# Patient Record
Sex: Male | Born: 1935
Health system: Southern US, Community
[De-identification: ages and names within clinical notes are randomized; demographics above are authoritative.]

## PROBLEM LIST (undated history)

## (undated) DIAGNOSIS — I350 Nonrheumatic aortic (valve) stenosis: Secondary | ICD-10-CM

## (undated) DIAGNOSIS — E785 Hyperlipidemia, unspecified: Secondary | ICD-10-CM

## (undated) DIAGNOSIS — R609 Edema, unspecified: Secondary | ICD-10-CM

## (undated) DIAGNOSIS — I83893 Varicose veins of bilateral lower extremities with other complications: Secondary | ICD-10-CM

## (undated) DIAGNOSIS — I5032 Chronic diastolic (congestive) heart failure: Secondary | ICD-10-CM

## (undated) DIAGNOSIS — K589 Irritable bowel syndrome without diarrhea: Secondary | ICD-10-CM

## (undated) DIAGNOSIS — I493 Ventricular premature depolarization: Secondary | ICD-10-CM

## (undated) DIAGNOSIS — J189 Pneumonia, unspecified organism: Secondary | ICD-10-CM

## (undated) DIAGNOSIS — B029 Zoster without complications: Secondary | ICD-10-CM

## (undated) DIAGNOSIS — N3281 Overactive bladder: Secondary | ICD-10-CM

## (undated) DIAGNOSIS — K219 Gastro-esophageal reflux disease without esophagitis: Secondary | ICD-10-CM

## (undated) DIAGNOSIS — I70219 Atherosclerosis of native arteries of extremities with intermittent claudication, unspecified extremity: Secondary | ICD-10-CM

## (undated) DIAGNOSIS — M1990 Unspecified osteoarthritis, unspecified site: Secondary | ICD-10-CM

## (undated) DIAGNOSIS — I10 Essential (primary) hypertension: Secondary | ICD-10-CM

## (undated) DIAGNOSIS — I6381 Other cerebral infarction due to occlusion or stenosis of small artery: Secondary | ICD-10-CM

## (undated) DIAGNOSIS — K5790 Diverticulosis of intestine, part unspecified, without perforation or abscess without bleeding: Secondary | ICD-10-CM

## (undated) DIAGNOSIS — D649 Anemia, unspecified: Secondary | ICD-10-CM

## (undated) DIAGNOSIS — I2510 Atherosclerotic heart disease of native coronary artery without angina pectoris: Secondary | ICD-10-CM

## (undated) DIAGNOSIS — I5189 Other ill-defined heart diseases: Secondary | ICD-10-CM

## (undated) DIAGNOSIS — I82409 Acute embolism and thrombosis of unspecified deep veins of unspecified lower extremity: Secondary | ICD-10-CM

## (undated) HISTORY — PX: TONSILLECTOMY: SUR1361

## (undated) HISTORY — DX: Acute embolism and thrombosis of unspecified deep veins of unspecified lower extremity: I82.409

## (undated) HISTORY — DX: Pneumonia, unspecified organism: J18.9

## (undated) HISTORY — DX: Atherosclerotic heart disease of native coronary artery without angina pectoris: I25.10

## (undated) HISTORY — DX: Essential (primary) hypertension: I10

## (undated) HISTORY — DX: Other cerebral infarction due to occlusion or stenosis of small artery: I63.81

## (undated) HISTORY — DX: Chronic diastolic (congestive) heart failure: I50.32

## (undated) HISTORY — DX: Hyperlipidemia, unspecified: E78.5

## (undated) HISTORY — PX: OTHER SURGICAL HISTORY: SHX169

## (undated) HISTORY — PX: CATARACT EXTRACTION: SUR2

## (undated) HISTORY — DX: Anemia, unspecified: D64.9

## (undated) HISTORY — DX: Gastro-esophageal reflux disease without esophagitis: K21.9

## (undated) HISTORY — PX: BACK SURGERY: SHX140

## (undated) HISTORY — PX: TOTAL KNEE ARTHROPLASTY: SHX125

## (undated) HISTORY — PX: COLONOSCOPY: SHX174

## (undated) HISTORY — DX: Varicose veins of bilateral lower extremities with other complications: I83.893

## (undated) HISTORY — DX: Edema, unspecified: R60.9

## (undated) HISTORY — DX: Atherosclerosis of native arteries of extremities with intermittent claudication, unspecified extremity: I70.219

## (undated) HISTORY — DX: Diverticulosis of intestine, part unspecified, without perforation or abscess without bleeding: K57.90

## (undated) HISTORY — DX: Unspecified osteoarthritis, unspecified site: M19.90

## (undated) HISTORY — DX: Zoster without complications: B02.9

## (undated) HISTORY — PX: KNEE ARTHROSCOPY: SHX127

---

## 1959-12-12 HISTORY — PX: OTHER SURGICAL HISTORY: SHX169

## 1998-07-14 ENCOUNTER — Ambulatory Visit (HOSPITAL_BASED_OUTPATIENT_CLINIC_OR_DEPARTMENT_OTHER): Admission: RE | Admit: 1998-07-14 | Discharge: 1998-07-14 | Payer: Self-pay | Admitting: *Deleted

## 1999-10-17 ENCOUNTER — Encounter: Admission: RE | Admit: 1999-10-17 | Discharge: 1999-10-17 | Payer: Self-pay | Admitting: Family Medicine

## 1999-10-17 ENCOUNTER — Encounter: Payer: Self-pay | Admitting: Family Medicine

## 1999-12-07 ENCOUNTER — Ambulatory Visit (HOSPITAL_COMMUNITY): Admission: RE | Admit: 1999-12-07 | Discharge: 1999-12-07 | Payer: Self-pay | Admitting: Urology

## 2000-12-11 DIAGNOSIS — J189 Pneumonia, unspecified organism: Secondary | ICD-10-CM

## 2000-12-11 HISTORY — DX: Pneumonia, unspecified organism: J18.9

## 2001-03-04 ENCOUNTER — Other Ambulatory Visit: Admission: RE | Admit: 2001-03-04 | Discharge: 2001-03-04 | Payer: Self-pay | Admitting: Urology

## 2001-03-11 ENCOUNTER — Other Ambulatory Visit: Admission: RE | Admit: 2001-03-11 | Discharge: 2001-03-11 | Payer: Self-pay | Admitting: Urology

## 2001-03-18 ENCOUNTER — Ambulatory Visit (HOSPITAL_COMMUNITY): Admission: RE | Admit: 2001-03-18 | Discharge: 2001-03-18 | Payer: Self-pay | Admitting: Urology

## 2001-03-18 ENCOUNTER — Other Ambulatory Visit: Admission: RE | Admit: 2001-03-18 | Discharge: 2001-03-18 | Payer: Self-pay | Admitting: Urology

## 2001-03-18 ENCOUNTER — Encounter (INDEPENDENT_AMBULATORY_CARE_PROVIDER_SITE_OTHER): Payer: Self-pay | Admitting: *Deleted

## 2001-03-18 ENCOUNTER — Encounter: Payer: Self-pay | Admitting: Urology

## 2001-04-12 ENCOUNTER — Encounter: Payer: Self-pay | Admitting: Family Medicine

## 2001-04-12 ENCOUNTER — Ambulatory Visit (HOSPITAL_COMMUNITY): Admission: RE | Admit: 2001-04-12 | Discharge: 2001-04-12 | Payer: Self-pay | Admitting: Family Medicine

## 2001-04-12 ENCOUNTER — Ambulatory Visit (HOSPITAL_COMMUNITY): Admission: RE | Admit: 2001-04-12 | Discharge: 2001-04-12 | Payer: Self-pay | Admitting: Neurosurgery

## 2001-04-22 ENCOUNTER — Encounter: Payer: Self-pay | Admitting: Thoracic Surgery (Cardiothoracic Vascular Surgery)

## 2001-04-22 ENCOUNTER — Encounter
Admission: RE | Admit: 2001-04-22 | Discharge: 2001-04-22 | Payer: Self-pay | Admitting: Thoracic Surgery (Cardiothoracic Vascular Surgery)

## 2001-05-20 ENCOUNTER — Encounter
Admission: RE | Admit: 2001-05-20 | Discharge: 2001-05-20 | Payer: Self-pay | Admitting: Thoracic Surgery (Cardiothoracic Vascular Surgery)

## 2001-05-20 ENCOUNTER — Encounter: Payer: Self-pay | Admitting: Thoracic Surgery (Cardiothoracic Vascular Surgery)

## 2001-07-01 ENCOUNTER — Encounter
Admission: RE | Admit: 2001-07-01 | Discharge: 2001-07-01 | Payer: Self-pay | Admitting: Thoracic Surgery (Cardiothoracic Vascular Surgery)

## 2001-07-01 ENCOUNTER — Encounter: Payer: Self-pay | Admitting: Thoracic Surgery (Cardiothoracic Vascular Surgery)

## 2001-09-11 ENCOUNTER — Ambulatory Visit (HOSPITAL_BASED_OUTPATIENT_CLINIC_OR_DEPARTMENT_OTHER): Admission: RE | Admit: 2001-09-11 | Discharge: 2001-09-11 | Payer: Self-pay | Admitting: *Deleted

## 2002-01-02 ENCOUNTER — Encounter: Payer: Self-pay | Admitting: Internal Medicine

## 2003-01-15 ENCOUNTER — Encounter: Payer: Self-pay | Admitting: Internal Medicine

## 2003-12-15 ENCOUNTER — Encounter: Payer: Self-pay | Admitting: Internal Medicine

## 2004-04-28 ENCOUNTER — Inpatient Hospital Stay (HOSPITAL_COMMUNITY): Admission: RE | Admit: 2004-04-28 | Discharge: 2004-05-03 | Payer: Self-pay | Admitting: Orthopedic Surgery

## 2004-12-13 ENCOUNTER — Ambulatory Visit: Payer: Self-pay | Admitting: Internal Medicine

## 2004-12-20 ENCOUNTER — Ambulatory Visit: Payer: Self-pay | Admitting: Internal Medicine

## 2005-03-20 ENCOUNTER — Ambulatory Visit: Payer: Self-pay | Admitting: Internal Medicine

## 2005-03-27 ENCOUNTER — Ambulatory Visit: Payer: Self-pay | Admitting: Internal Medicine

## 2005-03-28 ENCOUNTER — Ambulatory Visit: Payer: Self-pay

## 2005-03-28 ENCOUNTER — Encounter: Payer: Self-pay | Admitting: Internal Medicine

## 2005-04-21 ENCOUNTER — Emergency Department (HOSPITAL_COMMUNITY): Admission: EM | Admit: 2005-04-21 | Discharge: 2005-04-21 | Payer: Self-pay | Admitting: Emergency Medicine

## 2005-04-25 ENCOUNTER — Ambulatory Visit: Payer: Self-pay | Admitting: Internal Medicine

## 2005-05-09 ENCOUNTER — Encounter: Payer: Self-pay | Admitting: Internal Medicine

## 2005-05-09 ENCOUNTER — Ambulatory Visit: Payer: Self-pay

## 2005-06-20 ENCOUNTER — Ambulatory Visit: Payer: Self-pay | Admitting: Internal Medicine

## 2005-06-21 ENCOUNTER — Encounter: Admission: RE | Admit: 2005-06-21 | Discharge: 2005-06-21 | Payer: Self-pay | Admitting: Internal Medicine

## 2005-07-31 ENCOUNTER — Ambulatory Visit: Payer: Self-pay | Admitting: Internal Medicine

## 2005-12-15 ENCOUNTER — Ambulatory Visit: Payer: Self-pay | Admitting: Internal Medicine

## 2005-12-22 ENCOUNTER — Ambulatory Visit: Payer: Self-pay | Admitting: Internal Medicine

## 2006-01-01 ENCOUNTER — Encounter: Admission: RE | Admit: 2006-01-01 | Discharge: 2006-04-01 | Payer: Self-pay | Admitting: Internal Medicine

## 2006-01-02 ENCOUNTER — Ambulatory Visit: Payer: Self-pay | Admitting: Gastroenterology

## 2006-01-16 ENCOUNTER — Ambulatory Visit: Payer: Self-pay | Admitting: Gastroenterology

## 2006-01-19 ENCOUNTER — Ambulatory Visit: Payer: Self-pay | Admitting: Internal Medicine

## 2006-02-05 ENCOUNTER — Encounter: Payer: Self-pay | Admitting: Internal Medicine

## 2006-04-11 ENCOUNTER — Ambulatory Visit: Payer: Self-pay | Admitting: Internal Medicine

## 2006-04-18 ENCOUNTER — Ambulatory Visit: Payer: Self-pay | Admitting: Internal Medicine

## 2006-05-31 ENCOUNTER — Inpatient Hospital Stay (HOSPITAL_COMMUNITY): Admission: RE | Admit: 2006-05-31 | Discharge: 2006-06-04 | Payer: Self-pay | Admitting: Orthopedic Surgery

## 2006-07-12 ENCOUNTER — Ambulatory Visit: Payer: Self-pay | Admitting: Internal Medicine

## 2006-07-19 ENCOUNTER — Ambulatory Visit: Payer: Self-pay | Admitting: Internal Medicine

## 2006-11-13 ENCOUNTER — Ambulatory Visit: Payer: Self-pay | Admitting: Internal Medicine

## 2006-12-27 ENCOUNTER — Ambulatory Visit: Payer: Self-pay | Admitting: Internal Medicine

## 2006-12-27 LAB — CONVERTED CEMR LAB
ALT: 26 units/L (ref 0–40)
AST: 33 units/L (ref 0–37)
Albumin: 4.1 g/dL (ref 3.5–5.2)
Alkaline Phosphatase: 47 units/L (ref 39–117)
BUN: 10 mg/dL (ref 6–23)
Basophils Absolute: 0 10*3/uL (ref 0.0–0.1)
Basophils Relative: 0.5 % (ref 0.0–1.0)
CO2: 33 meq/L — ABNORMAL HIGH (ref 19–32)
Calcium: 9.8 mg/dL (ref 8.4–10.5)
Chloride: 103 meq/L (ref 96–112)
Cholesterol: 142 mg/dL (ref 0–200)
Creatinine, Ser: 0.9 mg/dL (ref 0.4–1.5)
Creatinine,U: 64.3 mg/dL
Eosinophils Relative: 3.2 % (ref 0.0–5.0)
GFR calc Af Amer: 107 mL/min
GFR calc non Af Amer: 88 mL/min
Glucose, Bld: 128 mg/dL — ABNORMAL HIGH (ref 70–99)
HCT: 42 % (ref 39.0–52.0)
HDL: 41.6 mg/dL (ref >39.0)
Hemoglobin: 13.8 g/dL (ref 13.0–17.0)
Hgb A1c MFr Bld: 6.8 % — ABNORMAL HIGH (ref 4.6–6.0)
LDL Cholesterol: 87 mg/dL (ref 0–99)
Lymphocytes Relative: 39.8 % (ref 12.0–46.0)
MCHC: 32.9 g/dL (ref 30.0–36.0)
MCV: 87.1 fL (ref 78.0–100.0)
Microalb Creat Ratio: 40.4 mg/g — ABNORMAL HIGH (ref 0.0–30.0)
Microalb, Ur: 2.6 mg/dL — ABNORMAL HIGH (ref 0.0–1.9)
Monocytes Absolute: 0.6 10*3/uL (ref 0.2–0.7)
Monocytes Relative: 9.1 % (ref 3.0–11.0)
Neutro Abs: 3.1 10*3/uL (ref 1.4–7.7)
Neutrophils Relative %: 47.4 % (ref 43.0–77.0)
PSA: 2.22 ng/mL (ref 0.10–4.00)
Platelets: 212 10*3/uL (ref 150–400)
Potassium: 4 meq/L (ref 3.5–5.1)
RBC: 4.82 M/uL (ref 4.22–5.81)
RDW: 14 % (ref 11.5–14.6)
Sodium: 142 meq/L (ref 135–145)
TSH: 1.33 microintl units/mL (ref 0.35–5.50)
Total Bilirubin: 0.9 mg/dL (ref 0.3–1.2)
Total CHOL/HDL Ratio: 3.4
Total Protein: 7.5 g/dL (ref 6.0–8.3)
Triglycerides: 69 mg/dL (ref 0–149)
VLDL: 14 mg/dL (ref 0–40)
WBC: 6.4 10*3/uL (ref 4.5–10.5)

## 2007-01-03 ENCOUNTER — Ambulatory Visit: Payer: Self-pay | Admitting: Internal Medicine

## 2007-02-06 ENCOUNTER — Ambulatory Visit: Payer: Self-pay | Admitting: Gastroenterology

## 2007-02-07 ENCOUNTER — Ambulatory Visit (HOSPITAL_COMMUNITY): Admission: RE | Admit: 2007-02-07 | Discharge: 2007-02-07 | Payer: Self-pay | Admitting: Gastroenterology

## 2007-02-07 ENCOUNTER — Encounter: Payer: Self-pay | Admitting: Internal Medicine

## 2007-02-11 ENCOUNTER — Ambulatory Visit: Payer: Self-pay | Admitting: Gastroenterology

## 2007-05-02 ENCOUNTER — Ambulatory Visit: Payer: Self-pay | Admitting: Internal Medicine

## 2007-05-02 LAB — CONVERTED CEMR LAB
ALT: 30 units/L (ref 0–40)
AST: 31 units/L (ref 0–37)
Albumin: 4.3 g/dL (ref 3.5–5.2)
Alkaline Phosphatase: 40 units/L (ref 39–117)
Bilirubin, Direct: 0.3 mg/dL (ref 0.0–0.3)
Cholesterol: 147 mg/dL (ref 0–200)
HDL: 33.2 mg/dL — ABNORMAL LOW (ref >39.0)
Hgb A1c MFr Bld: 7.9 % — ABNORMAL HIGH (ref 4.6–6.0)
LDL Cholesterol: 100 mg/dL — ABNORMAL HIGH (ref 0–99)
Total Bilirubin: 1 mg/dL (ref 0.3–1.2)
Total CHOL/HDL Ratio: 4.4
Total Protein: 7.5 g/dL (ref 6.0–8.3)
Triglycerides: 71 mg/dL (ref 0–149)
VLDL: 14 mg/dL (ref 0–40)

## 2007-05-08 ENCOUNTER — Ambulatory Visit: Payer: Self-pay | Admitting: Internal Medicine

## 2007-06-13 DIAGNOSIS — E1151 Type 2 diabetes mellitus with diabetic peripheral angiopathy without gangrene: Secondary | ICD-10-CM

## 2007-06-13 DIAGNOSIS — E785 Hyperlipidemia, unspecified: Secondary | ICD-10-CM

## 2007-06-13 DIAGNOSIS — E1165 Type 2 diabetes mellitus with hyperglycemia: Secondary | ICD-10-CM

## 2007-06-13 DIAGNOSIS — K219 Gastro-esophageal reflux disease without esophagitis: Secondary | ICD-10-CM

## 2007-08-13 ENCOUNTER — Ambulatory Visit: Payer: Self-pay | Admitting: Internal Medicine

## 2007-08-13 LAB — CONVERTED CEMR LAB
ALT: 28 units/L (ref 0–53)
AST: 27 units/L (ref 0–37)
Albumin: 4 g/dL (ref 3.5–5.2)
Alkaline Phosphatase: 38 units/L — ABNORMAL LOW (ref 39–117)
BUN: 19 mg/dL (ref 6–23)
Bilirubin, Direct: 0.1 mg/dL (ref 0.0–0.3)
CO2: 31 meq/L (ref 19–32)
Calcium: 9.8 mg/dL (ref 8.4–10.5)
Chloride: 106 meq/L (ref 96–112)
Cholesterol: 126 mg/dL (ref 0–200)
Creatinine, Ser: 1 mg/dL (ref 0.4–1.5)
GFR calc Af Amer: 95 mL/min
GFR calc non Af Amer: 78 mL/min
Glucose, Bld: 146 mg/dL — ABNORMAL HIGH (ref 70–99)
HDL: 33.8 mg/dL — ABNORMAL LOW (ref >39.0)
Hgb A1c MFr Bld: 7.2 % — ABNORMAL HIGH (ref 4.6–6.0)
LDL Cholesterol: 82 mg/dL (ref 0–99)
Potassium: 5.4 meq/L — ABNORMAL HIGH (ref 3.5–5.1)
Sodium: 142 meq/L (ref 135–145)
Total Bilirubin: 1 mg/dL (ref 0.3–1.2)
Total CHOL/HDL Ratio: 3.7
Total Protein: 7.4 g/dL (ref 6.0–8.3)
Triglycerides: 53 mg/dL (ref 0–149)
VLDL: 11 mg/dL (ref 0–40)

## 2007-08-20 ENCOUNTER — Ambulatory Visit: Payer: Self-pay | Admitting: Internal Medicine

## 2007-08-20 DIAGNOSIS — I152 Hypertension secondary to endocrine disorders: Secondary | ICD-10-CM

## 2007-08-20 DIAGNOSIS — I10 Essential (primary) hypertension: Secondary | ICD-10-CM

## 2007-08-20 DIAGNOSIS — E1159 Type 2 diabetes mellitus with other circulatory complications: Secondary | ICD-10-CM

## 2007-08-20 HISTORY — DX: Type 2 diabetes mellitus with other circulatory complications: E11.59

## 2007-08-20 HISTORY — DX: Hypertension secondary to endocrine disorders: I15.2

## 2007-10-15 ENCOUNTER — Ambulatory Visit: Payer: Self-pay | Admitting: Internal Medicine

## 2007-12-12 LAB — HM DIABETES FOOT EXAM

## 2007-12-17 ENCOUNTER — Ambulatory Visit: Payer: Self-pay | Admitting: Internal Medicine

## 2007-12-17 LAB — CONVERTED CEMR LAB
ALT: 31 units/L (ref 0–53)
AST: 29 units/L (ref 0–37)
Albumin: 4.1 g/dL (ref 3.5–5.2)
Basophils Absolute: 0 10*3/uL (ref 0.0–0.1)
Basophils Relative: 0.4 % (ref 0.0–1.0)
Chloride: 100 meq/L (ref 96–112)
Cholesterol: 153 mg/dL (ref 0–200)
Eosinophils Absolute: 0.3 10*3/uL (ref 0.0–0.6)
Eosinophils Relative: 3.6 % (ref 0.0–5.0)
GFR calc Af Amer: 107 mL/min
GFR calc non Af Amer: 88 mL/min
Glucose, Urine, Semiquant: NEGATIVE
HCT: 43.8 % (ref 39.0–52.0)
HDL: 37.4 mg/dL — ABNORMAL LOW (ref >39.0)
Hemoglobin: 14.8 g/dL (ref 13.0–17.0)
Ketones, urine, test strip: NEGATIVE
MCHC: 33.8 g/dL (ref 30.0–36.0)
Monocytes Relative: 9.5 % (ref 3.0–11.0)
Neutro Abs: 3.7 10*3/uL (ref 1.4–7.7)
Neutrophils Relative %: 52.3 % (ref 43.0–77.0)
Total Bilirubin: 1.1 mg/dL (ref 0.3–1.2)
Urobilinogen, UA: 0.2
VLDL: 15 mg/dL (ref 0–40)
WBC Urine, dipstick: NEGATIVE
WBC: 7.1 10*3/uL (ref 4.5–10.5)

## 2007-12-24 ENCOUNTER — Ambulatory Visit: Payer: Self-pay | Admitting: Internal Medicine

## 2007-12-24 ENCOUNTER — Telehealth (INDEPENDENT_AMBULATORY_CARE_PROVIDER_SITE_OTHER): Payer: Self-pay | Admitting: *Deleted

## 2007-12-26 ENCOUNTER — Ambulatory Visit: Payer: Self-pay | Admitting: Internal Medicine

## 2007-12-27 ENCOUNTER — Telehealth (INDEPENDENT_AMBULATORY_CARE_PROVIDER_SITE_OTHER): Payer: Self-pay | Admitting: *Deleted

## 2008-01-30 ENCOUNTER — Encounter: Payer: Self-pay | Admitting: Internal Medicine

## 2008-03-16 ENCOUNTER — Ambulatory Visit: Payer: Self-pay | Admitting: Internal Medicine

## 2008-03-16 LAB — CONVERTED CEMR LAB
ALT: 29 units/L (ref 0–53)
AST: 25 units/L (ref 0–37)
Alkaline Phosphatase: 37 units/L — ABNORMAL LOW (ref 39–117)
Bilirubin, Direct: 0.2 mg/dL (ref 0.0–0.3)
GFR calc non Af Amer: 101 mL/min
Glucose, Bld: 162 mg/dL — ABNORMAL HIGH (ref 70–99)
HDL: 34.8 mg/dL — ABNORMAL LOW (ref >39.0)
Potassium: 5.1 meq/L (ref 3.5–5.1)
Sodium: 141 meq/L (ref 135–145)
Total Bilirubin: 0.9 mg/dL (ref 0.3–1.2)
Total CHOL/HDL Ratio: 4.3
Triglycerides: 78 mg/dL (ref 0–149)
VLDL: 16 mg/dL (ref 0–40)

## 2008-03-23 ENCOUNTER — Ambulatory Visit: Payer: Self-pay | Admitting: Internal Medicine

## 2008-07-23 ENCOUNTER — Ambulatory Visit: Payer: Self-pay | Admitting: Internal Medicine

## 2008-08-11 ENCOUNTER — Ambulatory Visit: Payer: Self-pay | Admitting: Internal Medicine

## 2008-08-11 LAB — CONVERTED CEMR LAB
ALT: 34 units/L (ref 0–53)
Alkaline Phosphatase: 40 units/L (ref 39–117)
Bilirubin, Direct: 0.1 mg/dL (ref 0.0–0.3)
Calcium: 9.5 mg/dL (ref 8.4–10.5)
Chloride: 107 meq/L (ref 96–112)
Cholesterol: 142 mg/dL (ref 0–200)
GFR calc non Af Amer: 88 mL/min
Hgb A1c MFr Bld: 7.6 % — ABNORMAL HIGH (ref 4.6–6.0)
LDL Cholesterol: 94 mg/dL (ref 0–99)
Sodium: 143 meq/L (ref 135–145)
Total CHOL/HDL Ratio: 4
Total Protein: 7.1 g/dL (ref 6.0–8.3)
VLDL: 12 mg/dL (ref 0–40)

## 2008-09-02 ENCOUNTER — Encounter: Payer: Self-pay | Admitting: Internal Medicine

## 2008-09-22 ENCOUNTER — Ambulatory Visit: Payer: Self-pay | Admitting: Internal Medicine

## 2008-11-30 ENCOUNTER — Encounter: Payer: Self-pay | Admitting: Internal Medicine

## 2008-12-21 ENCOUNTER — Ambulatory Visit: Payer: Self-pay | Admitting: Internal Medicine

## 2008-12-21 LAB — CONVERTED CEMR LAB
ALT: 31 units/L (ref 0–53)
Albumin: 4.1 g/dL (ref 3.5–5.2)
Basophils Absolute: 0 10*3/uL (ref 0.0–0.1)
Bilirubin, Direct: 0.1 mg/dL (ref 0.0–0.3)
CO2: 32 meq/L (ref 19–32)
Chloride: 102 meq/L (ref 96–112)
Cholesterol: 150 mg/dL (ref 0–200)
Creatinine,U: 163.6 mg/dL
Eosinophils Absolute: 0.1 10*3/uL (ref 0.0–0.7)
Glucose, Bld: 151 mg/dL — ABNORMAL HIGH (ref 70–99)
HCT: 40.5 % (ref 39.0–52.0)
Hemoglobin: 14 g/dL (ref 13.0–17.0)
MCHC: 34.5 g/dL (ref 30.0–36.0)
Nitrite: NEGATIVE
Platelets: 177 10*3/uL (ref 150–400)
RBC: 4.48 M/uL (ref 4.22–5.81)
RDW: 13.4 % (ref 11.5–14.6)
Sodium: 139 meq/L (ref 135–145)
Specific Gravity, Urine: 1.025
Total Protein: 6.9 g/dL (ref 6.0–8.3)
VLDL: 10 mg/dL (ref 0–40)
WBC Urine, dipstick: NEGATIVE

## 2008-12-28 ENCOUNTER — Ambulatory Visit: Payer: Self-pay | Admitting: Internal Medicine

## 2009-04-26 ENCOUNTER — Ambulatory Visit: Payer: Self-pay | Admitting: Internal Medicine

## 2009-04-26 LAB — CONVERTED CEMR LAB
ALT: 29 units/L (ref 0–53)
AST: 35 units/L (ref 0–37)
BUN: 14 mg/dL (ref 6–23)
Bilirubin, Direct: 0.2 mg/dL (ref 0.0–0.3)
GFR calc non Af Amer: 117.31 mL/min (ref >60)
Glucose, Bld: 117 mg/dL — ABNORMAL HIGH (ref 70–99)
Potassium: 4 meq/L (ref 3.5–5.1)
Total Bilirubin: 1.1 mg/dL (ref 0.3–1.2)
Total CHOL/HDL Ratio: 4

## 2009-05-11 ENCOUNTER — Ambulatory Visit: Payer: Self-pay | Admitting: Internal Medicine

## 2009-06-03 ENCOUNTER — Encounter: Payer: Self-pay | Admitting: Internal Medicine

## 2009-09-06 ENCOUNTER — Ambulatory Visit: Payer: Self-pay | Admitting: Internal Medicine

## 2009-09-06 LAB — CONVERTED CEMR LAB
ALT: 31 units/L (ref 0–53)
Albumin: 4.1 g/dL (ref 3.5–5.2)
BUN: 17 mg/dL (ref 6–23)
Bilirubin, Direct: 0.1 mg/dL (ref 0.0–0.3)
Calcium: 9.6 mg/dL (ref 8.4–10.5)
Cholesterol: 147 mg/dL (ref 0–200)
Creatinine, Ser: 0.8 mg/dL (ref 0.4–1.5)
GFR calc non Af Amer: 100.46 mL/min (ref >60)
Glucose, Bld: 118 mg/dL — ABNORMAL HIGH (ref 70–99)
Total Bilirubin: 0.9 mg/dL (ref 0.3–1.2)
Total Protein: 7.1 g/dL (ref 6.0–8.3)
Triglycerides: 45 mg/dL (ref 0.0–149.0)
VLDL: 9 mg/dL (ref 0.0–40.0)

## 2009-09-13 ENCOUNTER — Ambulatory Visit: Payer: Self-pay | Admitting: Internal Medicine

## 2009-10-19 ENCOUNTER — Telehealth: Payer: Self-pay | Admitting: Internal Medicine

## 2009-12-11 LAB — HM DIABETES EYE EXAM

## 2010-01-17 ENCOUNTER — Ambulatory Visit: Payer: Self-pay | Admitting: Internal Medicine

## 2010-01-17 LAB — CONVERTED CEMR LAB
ALT: 30 units/L (ref 0–53)
AST: 33 units/L (ref 0–37)
Albumin: 4.1 g/dL (ref 3.5–5.2)
Alkaline Phosphatase: 39 units/L (ref 39–117)
GFR calc non Af Amer: 77.57 mL/min (ref >60)
Hgb A1c MFr Bld: 6.8 % — ABNORMAL HIGH (ref 4.6–6.5)
Total Protein: 7.2 g/dL (ref 6.0–8.3)
Triglycerides: 53 mg/dL (ref 0.0–149.0)

## 2010-01-24 ENCOUNTER — Ambulatory Visit: Payer: Self-pay | Admitting: Internal Medicine

## 2010-02-04 ENCOUNTER — Telehealth: Payer: Self-pay | Admitting: Internal Medicine

## 2010-02-21 ENCOUNTER — Telehealth: Payer: Self-pay | Admitting: Internal Medicine

## 2010-05-23 ENCOUNTER — Ambulatory Visit: Payer: Self-pay | Admitting: Internal Medicine

## 2010-05-23 LAB — CONVERTED CEMR LAB
AST: 33 units/L (ref 0–37)
Albumin: 4.1 g/dL (ref 3.5–5.2)
Calcium: 9.8 mg/dL (ref 8.4–10.5)
Cholesterol: 149 mg/dL (ref 0–200)
Creatinine, Ser: 0.8 mg/dL (ref 0.4–1.5)
GFR calc non Af Amer: 101.73 mL/min (ref >60)
Glucose, Bld: 125 mg/dL — ABNORMAL HIGH (ref 70–99)
HDL: 42.7 mg/dL (ref >39.00)
Hgb A1c MFr Bld: 6.9 % — ABNORMAL HIGH (ref 4.6–6.5)
Sodium: 144 meq/L (ref 135–145)
Total Protein: 6.9 g/dL (ref 6.0–8.3)
Triglycerides: 50 mg/dL (ref 0.0–149.0)
VLDL: 10 mg/dL (ref 0.0–40.0)

## 2010-05-30 ENCOUNTER — Ambulatory Visit: Payer: Self-pay | Admitting: Internal Medicine

## 2010-05-30 DIAGNOSIS — E1142 Type 2 diabetes mellitus with diabetic polyneuropathy: Secondary | ICD-10-CM

## 2010-05-30 HISTORY — DX: Type 2 diabetes mellitus with diabetic polyneuropathy: E11.42

## 2010-05-31 LAB — CONVERTED CEMR LAB
BUN: 16 mg/dL (ref 6–23)
GFR calc non Af Amer: 85.33 mL/min (ref >60)

## 2010-06-03 ENCOUNTER — Ambulatory Visit: Payer: Self-pay | Admitting: Internal Medicine

## 2010-06-06 LAB — CONVERTED CEMR LAB
CO2: 30 meq/L (ref 19–32)
Calcium: 9.5 mg/dL (ref 8.4–10.5)
Glucose, Bld: 107 mg/dL — ABNORMAL HIGH (ref 70–99)
Potassium: 4.8 meq/L (ref 3.5–5.1)
Sodium: 142 meq/L (ref 135–145)

## 2010-08-16 ENCOUNTER — Encounter: Payer: Self-pay | Admitting: Internal Medicine

## 2010-09-05 ENCOUNTER — Encounter: Payer: Self-pay | Admitting: Internal Medicine

## 2010-09-19 ENCOUNTER — Ambulatory Visit: Payer: Self-pay | Admitting: Internal Medicine

## 2010-09-19 LAB — CONVERTED CEMR LAB
ALT: 40 units/L (ref 0–53)
Albumin: 4.2 g/dL (ref 3.5–5.2)
BUN: 13 mg/dL (ref 6–23)
Bilirubin, Direct: 0.1 mg/dL (ref 0.0–0.3)
Chloride: 102 meq/L (ref 96–112)
Creatinine, Ser: 0.8 mg/dL (ref 0.4–1.5)
GFR calc non Af Amer: 98.75 mL/min (ref >60)
LDL Cholesterol: 102 mg/dL — ABNORMAL HIGH (ref 0–99)
Potassium: 4.1 meq/L (ref 3.5–5.1)
Total Bilirubin: 0.9 mg/dL (ref 0.3–1.2)
Total CHOL/HDL Ratio: 3
Total Protein: 7 g/dL (ref 6.0–8.3)
Triglycerides: 70 mg/dL (ref 0.0–149.0)
VLDL: 14 mg/dL (ref 0.0–40.0)

## 2010-09-26 ENCOUNTER — Ambulatory Visit: Payer: Self-pay | Admitting: Internal Medicine

## 2010-11-28 ENCOUNTER — Encounter: Payer: Self-pay | Admitting: Internal Medicine

## 2010-12-11 LAB — HM DIABETES EYE EXAM: HM Diabetic Eye Exam: NORMAL

## 2010-12-11 LAB — HM DIABETES FOOT EXAM: HM Diabetic Foot Exam: NORMAL

## 2011-01-10 NOTE — Assessment & Plan Note (Signed)
Summary: 4 month rov/njr   Vital Signs:  Patient profile:   77 year old male Weight:      206 pounds Temp:     98.4 degrees F Pulse rate:   60 / minute Resp:     12 per minute BP sitting:   120 / 70  (left arm)  Vitals Entered By: Gladis Riffle, RN (January 24, 2010 7:58 AM) CC: 4 month rov, labs done Is Patient Diabetic? No   CC:  4 month rov and labs done.  History of Present Illness:  Follow-Up Visit      This is a 77 year old man who presents for Follow-up visit.  The patient denies SOB and palpitations.  He has had some right sided shoulder pain that extends to chest wall. He thinks MSK because he had been lifting objcects previously. Reviewed stress test from 5 years ago Since the last visit the patient notes no new problems or concerns.  The patient reports taking meds as prescribed.  When questioned about possible medication side effects, the patient notes none.    All other systems reviewed and were negative   Preventive Screening-Counseling & Management  Alcohol-Tobacco     Smoking Status: never  Current Problems (verified): 1)  Hyperlipidemia  (ICD-272.4) 2)  Examination, Routine Medical  (ICD-V70.0) 3)  Hypertension  (ICD-401.9) 4)  Dyslipidemia  (ICD-272.4) 5)  Gerd  (ICD-530.81) 6)  Diverticulosis, Colon  (ICD-562.10) 7)  Diabetes Mellitus, Type II  (ICD-250.00)  Current Medications (verified): 1)  Aspir-81 81 Mg Tbec (Aspirin) .... Take 1 Tablet By Mouth Once A Day 2)  Doxazosin Mesylate 4 Mg  Tabs (Doxazosin Mesylate) .Marland Kitchen.. 1 Tablet Nightly 3)  Prilosec Otc 20 Mg Tbec (Omeprazole Magnesium) .... One By Mouth Daily 4)  Simvastatin 20 Mg Tabs (Simvastatin) .... One By Mouth Daily 5)  Gelnique 10 % Gel (Oxybutynin Chloride) .... Apply Daily As Needed 6)  Multivitamins  Tabs (Multiple Vitamin) .... Once Daily 7)  Lisinopril 20 Mg Tabs (Lisinopril) .Marland Kitchen.. 1 Tablet By Mouth Daily 8)  Etodolac 200 Mg Caps (Etodolac) .... Take 1 Capsulet By Mouth Two Times A  Day  Allergies: 1)  Cipro 2)  Sulfamethoxazole (Sulfamethoxazole)  Past History:  Past Medical History: Last updated: 08/11/2008 Diabetes mellitus, type II Diverticulosis, colon GERD Osteoarthritis Pneumonia, hx of, 2002 stress incontinence, urge dyslipidemia hiatal hernia Hyperlipidemia  Past Surgical History: Last updated: 06/13/2007 wrist tendon surgery CTS bilateral knee arthroscopy, bilateral  1988, 1992 Total knee replacement 2005, 2007 lumbar L arm 1961  Family History: Last updated: 12/24/2007 father still living-92 yo---good health.  mother still living 56 yo---hx CABG in her early 38s  Social History: Last updated: 12/24/2007 Married Never Smoked Regular exercise-yes Occupation: school bus for [employer]  Risk Factors: Exercise: yes (08/20/2007)  Risk Factors: Smoking Status: never (01/24/2010)  Physical Exam  General:  Well-developed,well-nourished,in no acute distress; alert,appropriate and cooperative throughout examination Head:  normocephalic and atraumatic.   Eyes:  pupils equal and pupils round.   Neck:  No deformities, masses, or tenderness noted. Chest Wall:  No deformities, masses, tenderness or gynecomastia noted. Lungs:  Normal respiratory effort, chest expands symmetrically. Lungs are clear to auscultation, no crackles or wheezes. Heart:  Normal rate and regular rhythm. S1 and S2 normal without gallop,, click, rub or other extra sounds. 2/6 short SEM LUSB Abdomen:  Bowel sounds positive,abdomen soft and non-tender without masses, organomegaly or hernias noted. Msk:  No deformity or scoliosis noted of thoracic or lumbar spine.  Pulses:  R radial normal and L radial normal.   Neurologic:  cranial nerves II-XII intact and gait normal.    Diabetes Management Exam:    Eye Exam:       Eye Exam done elsewhere          Date: 12/11/2009          Results: normal-pt's report          Done by: ophthalm   Impression & Recommendations:  Problem  # 1:  HYPERLIPIDEMIA (ICD-272.4)  well controlled continue current medications  His updated medication list for this problem includes:    Simvastatin 20 Mg Tabs (Simvastatin) ..... One by mouth daily  Labs Reviewed: SGOT: 33 (01/17/2010)   SGPT: 30 (01/17/2010)  Prior 10 Yr Risk Heart Disease: 33 % (08/11/2008)   HDL:49.30 (01/17/2010), 42.30 (09/06/2009)  LDL:86 (01/17/2010), 96 (09/06/2009)  Chol:146 (01/17/2010), 147 (09/06/2009)  Trig:53.0 (01/17/2010), 45.0 (09/06/2009)  Orders: EKG w/ Interpretation (93000)  Problem # 2:  HYPERTENSION (ICD-401.9)  controlled continue current medications  His updated medication list for this problem includes:    Doxazosin Mesylate 4 Mg Tabs (Doxazosin mesylate) .Marland Kitchen... 1 tablet nightly    Lisinopril 20 Mg Tabs (Lisinopril) .Marland Kitchen... 1 tablet by mouth daily  BP today: 120/70 Prior BP: 156/72 (09/13/2009)  Prior 10 Yr Risk Heart Disease: 33 % (08/11/2008)  Labs Reviewed: K+: 4.3 (01/17/2010) Creat: : 1.0 (01/17/2010)   Chol: 146 (01/17/2010)   HDL: 49.30 (01/17/2010)   LDL: 86 (01/17/2010)   TG: 53.0 (01/17/2010)  Orders: EKG w/ Interpretation (93000)  Problem # 3:  DIABETES MELLITUS, TYPE II (ICD-250.00) check labs today note weight gain---discussed need for diet/exercise   His updated medication list for this problem includes:    Aspir-81 81 Mg Tbec (Aspirin) .Marland Kitchen... Take 1 tablet by mouth once a day    Lisinopril 20 Mg Tabs (Lisinopril) .Marland Kitchen... 1 tablet by mouth daily  Labs Reviewed: Creat: 1.0 (01/17/2010)     Last Eye Exam: normal-pt's report (12/11/2009) Reviewed HgBA1c results: 6.8 (01/17/2010)  6.7 (09/06/2009)  Problem # 4:  GERD (ICD-530.81) well controlled on ppi note he does take etodolac   His updated medication list for this problem includes:    Prilosec Otc 20 Mg Tbec (Omeprazole magnesium) ..... One by mouth daily  Problem # 5:  DEGENERATIVE JOINT DISEASE, GENERALIZED (ICD-715.00) well controlled on as needed  etodolac The following medications were removed from the medication list:    Advil 200 Mg Tabs (Ibuprofen) .Marland Kitchen..Marland Kitchen Two as needed His updated medication list for this problem includes:    Aspir-81 81 Mg Tbec (Aspirin) .Marland Kitchen... Take 1 tablet by mouth once a day    Etodolac 200 Mg Caps (Etodolac) .Marland Kitchen... Take 1 capsulet by mouth two times a day  Complete Medication List: 1)  Aspir-81 81 Mg Tbec (Aspirin) .... Take 1 tablet by mouth once a day 2)  Doxazosin Mesylate 4 Mg Tabs (Doxazosin mesylate) .Marland Kitchen.. 1 tablet nightly 3)  Prilosec Otc 20 Mg Tbec (Omeprazole magnesium) .... One by mouth daily 4)  Simvastatin 20 Mg Tabs (Simvastatin) .... One by mouth daily 5)  Gelnique 10 % Gel (Oxybutynin chloride) .... Apply daily as needed 6)  Multivitamins Tabs (Multiple vitamin) .... Once daily 7)  Lisinopril 20 Mg Tabs (Lisinopril) .Marland Kitchen.. 1 tablet by mouth daily 8)  Etodolac 200 Mg Caps (Etodolac) .... Take 1 capsulet by mouth two times a day  Other Orders: TLB-A1C / Hgb A1C (Glycohemoglobin) (83036-A1C) TLB-BMP (Basic Metabolic Panel-BMET) (80048-METABOL) TLB-Lipid  Panel (80061-LIPID) TLB-ALT (SGPT) (84460-ALT)  Patient Instructions: 1)  Please schedule a follow-up appointment in 4 months. 2)  labs one week prior to visit 3)  lipids---272.4 4)  lfts-995.2 5)  bmet-995.2 6)  A1C-250.02 7)

## 2011-01-10 NOTE — Assessment & Plan Note (Signed)
Summary: 4 MONTH ROV/NJR   Vital Signs:  Patient profile:   77 year old male Weight:      214 pounds Temp:     98 degrees F oral Pulse rate:   62 / minute BP sitting:   128 / 76  Vitals Entered By: Lynann Beaver CMA (September 26, 2010 8:53 AM) CC: rov Pain Assessment Patient in pain? no        CC:  rov.  History of Present Illness:  Follow-Up Visit      This is a 77 year old man who presents for Follow-up visit.  The patient denies chest pain and palpitations.  Since the last visit the patient notes no new problems or concerns.  The patient reports taking meds as prescribed and monitoring blood sugars.  When questioned about possible medication side effects, the patient notes none.    patient denies chest pain, shortness of breath, PND, orthopnea, lower extremity edema. Appetite is normal. He denies any difficulty swallowing. No rashes or neurologic complaints. No complaints related HEENT.  Current Problems (verified): 1)  Peripheral Neuropathy  (ICD-356.9) 2)  Degenerative Joint Disease, Generalized  (ICD-715.00) 3)  Hyperlipidemia  (ICD-272.4) 4)  Examination, Routine Medical  (ICD-V70.0) 5)  Hypertension  (ICD-401.9) 6)  Dyslipidemia  (ICD-272.4) 7)  Gerd  (ICD-530.81) 8)  Diverticulosis, Colon  (ICD-562.10) 9)  Diabetes Mellitus, Type II  (ICD-250.00)  Current Medications (verified): 1)  Aspir-81 81 Mg Tbec (Aspirin) .... Take 1 Tablet By Mouth Once A Day 2)  Doxazosin Mesylate 4 Mg  Tabs (Doxazosin Mesylate) .Marland Kitchen.. 1 Tablet Nightly 3)  Prilosec Otc 20 Mg Tbec (Omeprazole Magnesium) .... One By Mouth Daily 4)  Simvastatin 20 Mg Tabs (Simvastatin) .... One By Mouth Daily 5)  Multivitamins  Tabs (Multiple Vitamin) .... Once Daily 6)  Etodolac 200 Mg Caps (Etodolac) .... Take 1 Capsulet By Mouth Two Times A Day 7)  Amlodipine Besylate 5 Mg Tabs (Amlodipine Besylate) .... Take 1 Tablet By Mouth Once A Day  Allergies (verified): 1)  Cipro 2)  Sulfamethoxazole  (Sulfamethoxazole)  Past History:  Past Medical History: Last updated: 08/11/2008 Diabetes mellitus, type II Diverticulosis, colon GERD Osteoarthritis Pneumonia, hx of, 2002 stress incontinence, urge dyslipidemia hiatal hernia Hyperlipidemia  Past Surgical History: Last updated: 06/13/2007 wrist tendon surgery CTS bilateral knee arthroscopy, bilateral  1988, 1992 Total knee replacement 2005, 2007 lumbar L arm 1961  Family History: Last updated: 12/24/2007 father still living-92 yo---good health.  mother still living 71 yo---hx CABG in her early 49s  Social History: Last updated: 12/24/2007 Married Never Smoked Regular exercise-yes Occupation: school bus for [employer]  Risk Factors: Exercise: yes (08/20/2007)  Risk Factors: Smoking Status: never (05/30/2010)  Physical Exam  General:  well-developed well-nourished male in no acute distress. HEENT exam atraumatic, normocephalic extraocular muscles are intact. Neck is supple without lymphadenopathy or thyromegaly. Chest clear to auscultation without any increased work of breathing. Cardiac exam S1-S2 are regular. Abdominal exam are wake him at bowel sounds, soft, nontender. Extremities no clubbing cyanosis or edema. Neurologic exam is alert gait is normal.   Impression & Recommendations:  Problem # 1:  HYPERLIPIDEMIA (ICD-272.4)  controlled continue current medications  His updated medication list for this problem includes:    Simvastatin 20 Mg Tabs (Simvastatin) ..... One by mouth daily  Labs Reviewed: SGOT: 40 (09/19/2010)   SGPT: 40 (09/19/2010)  Prior 10 Yr Risk Heart Disease: 33 % (08/11/2008)   HDL:46.50 (09/19/2010), 42.70 (05/23/2010)  LDL:102 (09/19/2010), 96 (16/09/9603)  Chol:162 (09/19/2010), 149 (05/23/2010)  Trig:70.0 (09/19/2010), 50.0 (05/23/2010)  His updated medication list for this problem includes:    Simvastatin 20 Mg Tabs (Simvastatin) ..... One by mouth daily  Problem # 2:  HYPERTENSION  (ICD-401.9)  controlled continue current medications  His updated medication list for this problem includes:    Doxazosin Mesylate 4 Mg Tabs (Doxazosin mesylate) .Marland Kitchen... 1 tablet nightly    Amlodipine Besylate 5 Mg Tabs (Amlodipine besylate) .Marland Kitchen... Take 1 tablet by mouth once a day  BP today: 128/76 Prior BP: 140/72 (05/30/2010)  Prior 10 Yr Risk Heart Disease: 33 % (08/11/2008)  Labs Reviewed: K+: 4.1 (09/19/2010) Creat: : 0.8 (09/19/2010)   Chol: 162 (09/19/2010)   HDL: 46.50 (09/19/2010)   LDL: 102 (09/19/2010)   TG: 70.0 (09/19/2010)  His updated medication list for this problem includes:    Doxazosin Mesylate 4 Mg Tabs (Doxazosin mesylate) .Marland Kitchen... 1 tablet nightly    Amlodipine Besylate 5 Mg Tabs (Amlodipine besylate) .Marland Kitchen... Take 1 tablet by mouth once a day  Problem # 3:  DIABETES MELLITUS, TYPE II (ICD-250.00)  not as well-controlled as previously. I will not start medications at this time. I've discussed with him his need for aggressive exercise and weight loss. probably has some peripheral neuropathy---affecting toes His updated medication list for this problem includes:    Aspir-81 81 Mg Tbec (Aspirin) .Marland Kitchen... Take 1 tablet by mouth once a day  Labs Reviewed: Creat: 0.8 (09/19/2010)     Last Eye Exam: normal-pt's report (12/11/2009) Reviewed HgBA1c results: 7.3 (09/19/2010)  6.9 (05/23/2010)  His updated medication list for this problem includes:    Aspir-81 81 Mg Tbec (Aspirin) .Marland Kitchen... Take 1 tablet by mouth once a day  Problem # 4:  DEGENERATIVE JOINT DISEASE, GENERALIZED (ICD-715.00)  okay to continue current medications. He understands risks. His updated medication list for this problem includes:    Aspir-81 81 Mg Tbec (Aspirin) .Marland Kitchen... Take 1 tablet by mouth once a day    Etodolac 200 Mg Caps (Etodolac) .Marland Kitchen... Take 1 capsulet by mouth two times a day  His updated medication list for this problem includes:    Aspir-81 81 Mg Tbec (Aspirin) .Marland Kitchen... Take 1 tablet by mouth  once a day    Etodolac 200 Mg Caps (Etodolac) .Marland Kitchen... Take 1 capsulet by mouth two times a day  Complete Medication List: 1)  Aspir-81 81 Mg Tbec (Aspirin) .... Take 1 tablet by mouth once a day 2)  Doxazosin Mesylate 4 Mg Tabs (Doxazosin mesylate) .Marland Kitchen.. 1 tablet nightly 3)  Prilosec Otc 20 Mg Tbec (Omeprazole magnesium) .... One by mouth daily 4)  Simvastatin 20 Mg Tabs (Simvastatin) .... One by mouth daily 5)  Multivitamins Tabs (Multiple vitamin) .... Once daily 6)  Etodolac 200 Mg Caps (Etodolac) .... Take 1 capsulet by mouth two times a day 7)  Amlodipine Besylate 5 Mg Tabs (Amlodipine besylate) .... Take 1 tablet by mouth once a day  Other Orders: Admin 1st Vaccine (16109) Flu Vaccine 32yrs + (60454)   Patient Instructions: 1)  Please schedule a follow-up appointment in 4 months. 2)  labs one week prior to visit 3)  lipids---272.4 4)  lfts-995.2 5)  bmet-995.2 6)  A1C-250.02 7)     Flu Vaccine Consent Questions     Do you have a history of severe allergic reactions to this vaccine? no    Any prior history of allergic reactions to egg and/or gelatin? no    Do you have a sensitivity to the preservative  Thimersol? no    Do you have a past history of Guillan-Barre Syndrome? no    Do you currently have an acute febrile illness? no    Have you ever had a severe reaction to latex? no    Vaccine information given and explained to patient? yes    Are you currently pregnant? no    Lot Number:[id number]   Exp Date:06/10/2011   Site Given  Left Deltoid IM  Orders Added: 1)  Admin 1st Vaccine [90471] 2)  Flu Vaccine 82yrs + [60454] 3)  New Patient  1-4 years [99382] 4)  Est. Patient Level IV [09811]    .[email or web address]

## 2011-01-10 NOTE — Assessment & Plan Note (Signed)
Summary: 4 month rov/njr   Vital Signs:  Patient profile:   77 year old male Weight:      210 pounds BMI:     31.58 Pulse rate:   62 / minute Pulse rhythm:   regular Resp:     12 per minute BP sitting:   140 / 72  (left arm) Cuff size:   regular  Vitals Entered By: Gladis Riffle, RN (May 30, 2010 8:05 AM)  Nutrition Counseling: Patient's BMI is greater than 25 and therefore counseled on weight management options. CC: 4 month rov, labs done- Is Patient Diabetic? Yes Did you bring your meter with you today? No   CC:  4 month rov and labs done-.  History of Present Illness:  Follow-Up Visit      This is a 77 year old man who presents for Follow-up visit.  The patient denies chest pain and palpitations.  Since the last visit the patient notes no new problems or concerns.  The patient reports taking meds as prescribed.  When questioned about possible medication side effects, the patient notes none.    All other systems reviewed and were negative   Preventive Screening-Counseling & Management  Alcohol-Tobacco     Smoking Status: never  Current Medications (verified): 1)  Aspir-81 81 Mg Tbec (Aspirin) .... Take 1 Tablet By Mouth Once A Day 2)  Doxazosin Mesylate 4 Mg  Tabs (Doxazosin Mesylate) .Marland Kitchen.. 1 Tablet Nightly 3)  Prilosec Otc 20 Mg Tbec (Omeprazole Magnesium) .... One By Mouth Daily 4)  Simvastatin 20 Mg Tabs (Simvastatin) .... One By Mouth Daily 5)  Multivitamins  Tabs (Multiple Vitamin) .... Once Daily 6)  Lisinopril 20 Mg Tabs (Lisinopril) .Marland Kitchen.. 1 Tablet By Mouth Daily 7)  Etodolac 200 Mg Caps (Etodolac) .... Take 1 Capsulet By Mouth Two Times A Day  Allergies: 1)  Cipro 2)  Sulfamethoxazole (Sulfamethoxazole)  Past History:  Past Medical History: Last updated: 08/11/2008 Diabetes mellitus, type II Diverticulosis, colon GERD Osteoarthritis Pneumonia, hx of, 2002 stress incontinence, urge dyslipidemia hiatal hernia Hyperlipidemia  Past Surgical  History: Last updated: 06/13/2007 wrist tendon surgery CTS bilateral knee arthroscopy, bilateral  1988, 1992 Total knee replacement 2005, 2007 lumbar L arm 1961  Family History: Last updated: 12/24/2007 father still living-92 yo---good health.  mother still living 79 yo---hx CABG in her early 76s  Social History: Last updated: 12/24/2007 Married Never Smoked Regular exercise-yes Occupation: school bus for [employer]  Risk Factors: Exercise: yes (08/20/2007)  Risk Factors: Smoking Status: never (05/30/2010)  Physical Exam  General:  Well-developed,well-nourished,in no acute distress; alert,appropriate and cooperative throughout examination Head:  normocephalic and atraumatic.   Eyes:  pupils equal and pupils round.   Ears:  R ear normal and L ear normal.   Neck:  No deformities, masses, or tenderness noted. Chest Wall:  No deformities, masses, tenderness or gynecomastia noted. Lungs:  Normal respiratory effort, chest expands symmetrically. Lungs are clear to auscultation, no crackles or wheezes. Heart:  Normal rate and regular rhythm. S1 and S2 normal without gallop,, click, rub or other extra sounds. 2/6 short SEM LUSB Abdomen:  soft and non-tender.   Msk:  No deformity or scoliosis noted of thoracic or lumbar spine.   Pulses:  R radial normal, R dorsalis pedis normal, L radial normal, and L dorsalis pedis normal.   Neurologic:  cranial nerves II-XII intact and gait normal.   Skin:  turgor normal and color normal.   Psych:  normally interactive and good eye contact.  Diabetes Management Exam:    Foot Exam (with socks and/or shoes not present):       Sensory-Pinprick/Light touch:          Left medial foot (L-4): diminished          Left dorsal foot (L-5): diminished          Left lateral foot (S-1): diminished          Right medial foot (L-4): diminished          Right dorsal foot (L-5): diminished          Right lateral foot (S-1): diminished        Sensory-Monofilament:          Left foot: normal       Inspection:          Left foot: normal   Impression & Recommendations:  Problem # 1:  HYPERTENSION (ICD-401.9) fair control home bps 130s/60s continue current medications  His updated medication list for this problem includes:    Doxazosin Mesylate 4 Mg Tabs (Doxazosin mesylate) .Marland Kitchen... 1 tablet nightly    Lisinopril 20 Mg Tabs (Lisinopril) .Marland Kitchen... 1 tablet by mouth daily  BP today: 140/72 Prior BP: 120/70 (01/24/2010)  Prior 10 Yr Risk Heart Disease: 33 % (08/11/2008)  Labs Reviewed: K+: 5.7 (05/23/2010) Creat: : 0.8 (05/23/2010)   Chol: 149 (05/23/2010)   HDL: 42.70 (05/23/2010)   LDL: 96 (05/23/2010)   TG: 50.0 (05/23/2010)  Problem # 2:  DYSLIPIDEMIA (ICD-272.4) controlled continue current medications  His updated medication list for this problem includes:    Simvastatin 20 Mg Tabs (Simvastatin) ..... One by mouth daily  Labs Reviewed: SGOT: 33 (05/23/2010)   SGPT: 36 (05/23/2010)  Prior 10 Yr Risk Heart Disease: 33 % (08/11/2008)   HDL:42.70 (05/23/2010), 49.30 (01/17/2010)  LDL:96 (05/23/2010), 86 (01/17/2010)  Chol:149 (05/23/2010), 146 (01/17/2010)  Trig:50.0 (05/23/2010), 53.0 (01/17/2010)  Problem # 3:  DIABETES MELLITUS, TYPE II (ICD-250.00) controlled continue current medications  His updated medication list for this problem includes:    Aspir-81 81 Mg Tbec (Aspirin) .Marland Kitchen... Take 1 tablet by mouth once a day    Lisinopril 20 Mg Tabs (Lisinopril) .Marland Kitchen... 1 tablet by mouth daily  Labs Reviewed: Creat: 0.8 (05/23/2010)     Last Eye Exam: normal-pt's report (12/11/2009) Reviewed HgBA1c results: 6.9 (05/23/2010)  6.8 (01/17/2010)  Problem # 4:  DEGENERATIVE JOINT DISEASE, GENERALIZED (ICD-715.00) describes toe stiffness in the a.m. he also reports a tingling discomfort of feet---distal to mid-foot.   His updated medication list for this problem includes:    Aspir-81 81 Mg Tbec (Aspirin) .Marland Kitchen... Take 1 tablet  by mouth once a day    Etodolac 200 Mg Caps (Etodolac) .Marland Kitchen... Take 1 capsulet by mouth two times a day  Problem # 5:  PERIPHERAL NEUROPATHY (ICD-356.9) likely dm related---no treatment at this time  Problem # 6:  HYPERKALEMIA (ICD-276.7)  check today  Orders: TLB-BMP (Basic Metabolic Panel-BMET) (80048-METABOL)  Complete Medication List: 1)  Aspir-81 81 Mg Tbec (Aspirin) .... Take 1 tablet by mouth once a day 2)  Doxazosin Mesylate 4 Mg Tabs (Doxazosin mesylate) .Marland Kitchen.. 1 tablet nightly 3)  Prilosec Otc 20 Mg Tbec (Omeprazole magnesium) .... One by mouth daily 4)  Simvastatin 20 Mg Tabs (Simvastatin) .... One by mouth daily 5)  Multivitamins Tabs (Multiple vitamin) .... Once daily 6)  Lisinopril 20 Mg Tabs (Lisinopril) .Marland Kitchen.. 1 tablet by mouth daily 7)  Etodolac 200 Mg Caps (Etodolac) .... Take 1 capsulet by  mouth two times a day  Patient Instructions: 1)  Please schedule a follow-up appointment in 4 months. 2)  labs one week prior to visit 3)  lipids---272.4 4)  lfts-995.2 5)  bmet-995.2 6)  A1C-250.02 7)      Appended Document: Orders Update     Clinical Lists Changes  Orders: Added new Service order of Venipuncture [phone]) - Signed

## 2011-01-10 NOTE — Letter (Signed)
Summary: [facility]  [facility]   Imported By: Sherian Rein 08/19/2010 09:32:36  _____________________________________________________________________  External Attachment:    Type:   Image     Comment:   External Document

## 2011-01-10 NOTE — Progress Notes (Signed)
Summary: etodolac   Phone Note Refill Request Message from:  Fax from Pharmacy on February 04, 2010 2:30 PM  Refills Requested: Medication #1:  ETODOLAC 200 MG CAPS Take 1 capsulet by mouth two times a day. Initial call taken by: Kern Reap CMA Duncan Dull),  February 04, 2010 2:30 PM    Prescriptions: ETODOLAC 200 MG CAPS (ETODOLAC) Take 1 capsulet by mouth two times a day  #120 x 0   Entered by:   Kern Reap CMA (AAMA)   Authorized by:   Birdie Sons MD   Signed by:   Kern Reap CMA (AAMA) on 02/04/2010   Method used:   Electronically to        [facility] [address] (retail)       [address]       [place], [place]  [address]       Ph: [phone]       Fax: [phone]   RxID:   [id number]

## 2011-01-10 NOTE — Progress Notes (Signed)
Summary: simvastatin  Phone Note Refill Request Message from:  Fax from Pharmacy  Refills Requested: Medication #1:  SIMVASTATIN 20 MG TABS one by mouth daily [phone]     FAX----[phone]  Initial call taken by: Warnell Forester,  February 21, 2010 10:05 AM    Prescriptions: SIMVASTATIN 20 MG TABS (SIMVASTATIN) one by mouth daily  #90 Each x 4   Entered by:   Gladis Riffle, RN   Authorized by:   Birdie Sons MD   Signed by:   Gladis Riffle, RN on 02/21/2010   Method used:   Electronically to        [facility] [address] (retail)       [address]       [place], [place]  [address]       Ph: [phone]       Fax: [phone]   RxID:   [id number]

## 2011-01-10 NOTE — Letter (Signed)
Summary: [facility]  [facility]   Imported By: Maryln Gottron 09/14/2010 15:12:53  _____________________________________________________________________  External Attachment:    Type:   Image     Comment:   External Document

## 2011-01-12 NOTE — Letter (Signed)
Summary: [facility]  [facility]   Imported By: Maryln Gottron 12/07/2010 09:48:41  _____________________________________________________________________  External Attachment:    Type:   Image     Comment:   External Document

## 2011-01-23 ENCOUNTER — Other Ambulatory Visit (INDEPENDENT_AMBULATORY_CARE_PROVIDER_SITE_OTHER): Payer: BC Managed Care – PPO | Admitting: Internal Medicine

## 2011-01-23 DIAGNOSIS — T887XXA Unspecified adverse effect of drug or medicament, initial encounter: Secondary | ICD-10-CM

## 2011-01-23 DIAGNOSIS — I10 Essential (primary) hypertension: Secondary | ICD-10-CM

## 2011-01-23 DIAGNOSIS — E119 Type 2 diabetes mellitus without complications: Secondary | ICD-10-CM

## 2011-01-23 DIAGNOSIS — E785 Hyperlipidemia, unspecified: Secondary | ICD-10-CM

## 2011-01-23 LAB — LIPID PANEL
Cholesterol: 141 mg/dL (ref 0–200)
LDL Cholesterol: 89 mg/dL (ref 0–99)
Total CHOL/HDL Ratio: 4

## 2011-01-23 LAB — BASIC METABOLIC PANEL
BUN: 19 mg/dL (ref 6–23)
CO2: 28 mEq/L (ref 19–32)
Calcium: 9 mg/dL (ref 8.4–10.5)
Chloride: 97 mEq/L (ref 96–112)
Glucose, Bld: 118 mg/dL — ABNORMAL HIGH (ref 70–99)
Potassium: 4.3 mEq/L (ref 3.5–5.1)

## 2011-01-23 LAB — HEPATIC FUNCTION PANEL
Alkaline Phosphatase: 39 U/L (ref 39–117)
Total Protein: 6.7 g/dL (ref 6.0–8.3)

## 2011-01-27 ENCOUNTER — Encounter: Payer: Self-pay | Admitting: Internal Medicine

## 2011-01-30 ENCOUNTER — Ambulatory Visit (INDEPENDENT_AMBULATORY_CARE_PROVIDER_SITE_OTHER): Payer: BC Managed Care – PPO | Admitting: Internal Medicine

## 2011-01-30 ENCOUNTER — Encounter: Payer: Self-pay | Admitting: Internal Medicine

## 2011-01-30 DIAGNOSIS — I10 Essential (primary) hypertension: Secondary | ICD-10-CM

## 2011-01-30 DIAGNOSIS — E119 Type 2 diabetes mellitus without complications: Secondary | ICD-10-CM

## 2011-01-30 DIAGNOSIS — G609 Hereditary and idiopathic neuropathy, unspecified: Secondary | ICD-10-CM

## 2011-01-30 DIAGNOSIS — E785 Hyperlipidemia, unspecified: Secondary | ICD-10-CM

## 2011-01-30 NOTE — Assessment & Plan Note (Signed)
Controlled Continue same meds 

## 2011-01-30 NOTE — Assessment & Plan Note (Signed)
Well controlled Continue same meds 

## 2011-01-30 NOTE — Assessment & Plan Note (Signed)
Suspect related to DM

## 2011-01-30 NOTE — Assessment & Plan Note (Signed)
Adequate control on no meds Continue same encouaged daily exercise

## 2011-01-30 NOTE — Progress Notes (Signed)
  Subjective:    Patient ID: [name], male    DOB: 05/04/1933, 77 y.o.   MRN: [id number]  HPI   patient comes in for followup of multiple medical problems including type 2 diabetes, hyperlipidemia, hypertension. The patient does not check blood sugar or blood pressure at home. The patetient does not follow an exercise or diet program. The patient denies any polyuria, polydipsia.  In the past the patient has gone to diabetic treatment center. The patient is tolerating medications  Without difficulty. The patient does admit to medication compliance.    Past Medical History  Diagnosis Date  . Diabetes mellitus     type 2  . Diverticulosis of colon   . GERD (gastroesophageal reflux disease)   . Osteoarthritis   . Pneumonia 2002    history of  . Stress incontinence, male     urge  . Dyslipidemia   . Hernia, hiatal   . Hyperlipidemia    Past Surgical History  Procedure Date  . Wrist tendon surgery   . Cts bilateral   . Knee arthroscopy 1988, 1992    bilateral  . Total knee arthroplasty 2005, 2007  . Lumbar left arm 1961    reports that he has never smoked. He does not have any smokeless tobacco history on file. His alcohol and drug histories not on file. family history includes Other (age of onset:80) in his mother.    Allergies  Allergen Reactions  . Ciprofloxacin     REACTION: unspecified  . Sulfamethoxazole     REACTION: rash     Review of Systems  patient denies chest pain, shortness of breath, orthopnea. Denies lower extremity edema, abdominal pain, change in appetite, change in bowel movements. Patient denies rashes, musculoskeletal complaints. No other specific complaints in a complete review of systems.      Objective:   Physical Exam  well-developed well-nourished male in no acute distress. HEENT exam atraumatic, normocephalic, neck supple without jugular venous distention. Chest clear to auscultation cardiac exam S1-S2 are regular. Abdominal exam  overweight with bowel sounds, soft and nontender. Extremities no edema. Neurologic exam is alert with a normal gait.        Assessment & Plan:

## 2011-02-27 ENCOUNTER — Other Ambulatory Visit: Payer: Self-pay | Admitting: Internal Medicine

## 2011-04-15 ENCOUNTER — Other Ambulatory Visit: Payer: Self-pay | Admitting: Internal Medicine

## 2011-04-21 ENCOUNTER — Other Ambulatory Visit: Payer: Self-pay | Admitting: Internal Medicine

## 2011-04-28 NOTE — Assessment & Plan Note (Signed)
[facility]                         GASTROENTEROLOGY OFFICE NOTE   NAME:[name] [name] SPIKER                       MRN:          [id number]  DATE:02/06/2007                            DOB:          Nov 22, 1933    REASON FOR REFERRAL:  Dr. Cato Mulligan ask me to evaluate [name] [name] in  consultation regarding new dysphagia and odynophagia.   HISTORY OF PRESENT ILLNESS:  [name] [name] is a very pleasant 77 year old  man who has noticed trouble swallowing for the past 1 month.  He  describes food feeling like it gets caught in his upper esophagus.  Water sometimes feels like it goes slowly as well.  He has never had any  overt food impaction, and has not had to vomit any food up.  Usually the  food bolus will eventually go down, and as it goes down, it is quite  uncomfortable.  He had a vomiting illness 3 or 4 weeks ago, but his  dysphagia symptoms preceded that.  The vomiting illness lasted 1 day,  and has not returned.   He used to have GERD symptoms in the past.  He has been on Prilosec once  daily for several years, and his GERD symptoms are well controlled on  that.   Labs checked 1 month ago show a normal CBC, and essentially normal  complete metabolic profile.   REVIEW OF SYSTEMS:  Notable for intentional 30-pound weight loss over  the past 6 to 8 months.  He has not lost more weight in the past month  since his symptoms have been going on.  The rest of his review of  systems are essentially normal, and are available on his nursing intake  sheet.   PAST MEDICAL HISTORY:  1. BPH.  2. Two knee replacements.  3. Rupture disk in 1961.  4. GERD.  5. Hypertension.  6. Elevated cholesterol.  7. Status post colonoscopy 2004 by Dr. Victorino Dike.  Hyperplastic      polyps were removed.  Repeat colonoscopy 3 years later was normal      except for diverticulosis.  Next colonoscopy should be February      2007 for routine colorectal cancer screening.   CURRENT  MEDICATIONS:  Flomax, oxybutynin, etodolac, Prilosec,  Lisinopril, Simvastatin, baby aspirin.   ALLERGIES:  1. CIPRO.  2. SULFA.   SOCIAL HISTORY:  Married.  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  Nonsmoker.  Nondrinker.   FAMILY HISTORY:  No colon cancer or colon polyps in family.   PHYSICAL EXAMINATION:  Height 5 feet 10 inches.  Weight 233 pounds.  Blood pressure 162/88.  Pulse 74.  CONSTITUTIONAL:  Generally well appearing.  NEUROLOGIC:  Alert and oriented x3.  Eyes:  Extraocular movements intact.  Mouth:  Oropharynx moist.  No  lesions.  NECK:  Supple.  No lymphadenopathy.  CARDIOVASCULAR:  Heart regular rate and rhythm.  LUNGS:  Clear to auscultation bilaterally.  ABDOMEN:  Soft, non-tender and non-distended.  Normal bowel sounds.  EXTREMITIES:  No lower extremity edema.   ASSESSMENT AND PLAN:  A 77 year old man  with 1 month of dysphagia to  solids and liquids, seems to be progressing.   Certainly, peptic stricture can cause these symptoms, but my biggest  concern is neoplasm.  I will arrange for him to have EGD tomorrow  morning, and we will go from there.  He has already lab tests, and I see  no reason for any lab tests prior to the EGD tomorrow.     Rachael Fee, MD  Electronically Signed    DPJ/MedQ  DD: 02/06/2007  DT: 02/06/2007  Job #: [id number]   cc:   Valetta Mole. Swords, MD

## 2011-04-28 NOTE — H&P (Signed)
NAME:  [name], [name]                          ACCOUNT NO.:  [id number]   MEDICAL RECORD NO.:  [id number]                   PATIENT TYPE:  INP   LOCATION:  NA                                   FACILITY:  [facility]   PHYSICIAN:  Tracy A. Shuford, P.A.-C.           DATE OF BIRTH:  Oct 31, 1933   DATE OF ADMISSION:  DATE OF DISCHARGE:                                HISTORY & PHYSICAL   CHIEF COMPLAINT:  Right knee pain.   HISTORY OF PRESENT ILLNESS:  Mr. Reading is a very pleasant 77 year old male  who is referred to [place] through the courtesy of Dr. Cato Mulligan for evaluation of  osteoarthritis of his right knee. He reports a traumatic injury playing  baseball, but had done well until approximately the past ten years. He had  subsequently undergone knee arthroscopy and has had multiple injections. He  has had significant deterioration in his symptoms and is now having constant  pain and it is significant impacts his daily living. He is currently  employed as a busdriver and it has become quite painful for him.  X-rays  demonstrate end-stage osteoarthritis with peripheral osteophytes noted about  the right knee.  At this time total knee arthroplasty is indicated. The  risks and benefits discussed in length with the patient and he wishes to  proceed.   PAST MEDICAL HISTORY:  1. Reflux.  2. BPH.  3. Osteoarthritis.   PAST SURGICAL HISTORY:  1. Lumbar diskectomy.  2. Right knee arthroscopy.   CURRENT MEDICATIONS:  1. Prilosec 20 mg daily.  2. Flomax 0.4 mg daily.  3. Detrol LA 4 mg daily.  4. Lodine 400 mg b.i.d. and aspirin daily; he is stopped using both of these     preoperatively.   ALLERGIES:  No known drug allergies.   FAMILY HISTORY:  Mother with heart disease.   SOCIAL HISTORY:  Does not drink or smoke. He lives in a one-story home with  his wife to help postoperatively and Genevieve Norlander has been consulted.   REVIEW OF SYSTEMS:  The patient denies any recent fever, chills,  nightsweats. No bleeding intensities. CNS: No blurred of double vision,  seizures, headache, or paralysis. RESPIRATORY:  No shortness of breath,  productive cough, or hemoptysis. CARDIOVASCULAR: No chest pain, angina, or  orthopnea. GI: No nausea, vomiting, constipation, melena, or bloody stools.   PHYSICAL EXAMINATION:  VITAL SIGNS: Blood pressure today is 132/88.  GENERAL: A well-developed, well-nourished 77 year old male.  HEENT:  Normocephalic. Extraocular movements intact.  NECK: Supple. No lymphadenopathy. No carotid bruits.  CHEST: Clear to auscultation. No rales or rhonchi.  ABDOMEN: Positive bowel sounds, soft, and nontender.  EXTREMITIES: No pitting edema. He does have painful range of motion about  the right knee and crepitus noted and a mild effusion.   IMPRESSION:  1. End-stage osteoarthritis, right knee.  2. Osteoarthritis.  3. Gastroesophageal reflux disease.  4. Benign prostatic hypertrophy.   PLAN:  Right total knee arthroplasty. His plan is for postoperative care at  home with home health services ordered through [facility].                                               Tracy A. Shuford, P.A.-C.    TAS/MEDQ  D:  04/23/2004  T:  04/23/2004  Job:  [id number]   cc:   Valetta Mole. Swords, M.D. [facility]

## 2011-04-28 NOTE — Discharge Summary (Signed)
NAMELIEV, BROCKBANK NO.:  [id number]   MEDICAL RECORD NO.:  [id number]          PATIENT TYPE:  INP   LOCATION:  5041                         FACILITY:  [facility]   PHYSICIAN:  French Ana A. Shuford, P.A.-C.DATE OF BIRTH:  [birth year]   DATE OF ADMISSION:  05/31/2006  DATE OF DISCHARGE:  06/04/2006                                 DISCHARGE SUMMARY   Audio too short to transcribe (less than 5 seconds)      Tracy A. Shuford, P.A.-C.     TAS/MEDQ  D:  08/02/2006  T:  08/02/2006  Job:  [id number]

## 2011-04-28 NOTE — Op Note (Signed)
NAME:  [name], [name]                          ACCOUNT NO.:  [id number]   MEDICAL RECORD NO.:  [id number]                   PATIENT TYPE:  INP   LOCATION:  0003                                 FACILITY:  [facility]   PHYSICIAN:  Vania Rea. Supple, M.D.               DATE OF BIRTH:  [birth year]   DATE OF PROCEDURE:  04/28/2004  DATE OF DISCHARGE:                                 OPERATIVE REPORT   PREOPERATIVE DIAGNOSIS:  Right end-stage osteoarthrosis.   POSTOPERATIVE DIAGNOSIS:  Right end-stage osteoarthrosis.   PROCEDURE:  Cemented right total knee arthroplasty utilizing a Q-PVC  posterior stabilized implant size 5 femur, size 6 rotating platform tibial  tray and a 38-mm patella and a 12.5-mm thick, rotating platform polyethylene  insert.   SURGEON OF RECORD:  Vania Rea. Supple, M.D.   Threasa HeadsFrench Ana A. Shuford, P.A.-C.   ANESTHESIA:  Spinal.   ESTIMATED BLOOD LOSS:  150 cc.   HEMOVAC:  Times 1.   TOURNIQUET TIME:  59 minutes.   HISTORY:  Mr. Urquilla is a 77 year old gentleman who has had progressively  increasing right knee pain and limitations in motion with an examination  showing a painful arc of motion with a 20-degree flexion contracture.  The  radiographs confirm end-stage osteoarthrosis.  Due to his ongoing pain and  functional limitations he is brought to the operating room, at this time,  for planned right total knee arthroplasty as described below.   Preoperatively Mr. Trice was counseled on treatment options, as well as  risks versus benefits therefore.  Possible surgical complications including  infection, neurovascular injury, DVT, PE, persistent pain, loss of motion,  instability, and possible revision of the implant were reviewed.  He  understands and accepts and agrees with our planned procedure.   PROCEDURE IN DETAIL:  After undergoing routine prep and evaluation the  patient received prophylactic antibiotics. He was brought to the operating  room and in the  sitting position had a spinal anesthetic placed and then  placed supine with Foley catheter placed.  Tourniquet applied to the right  thigh and the right leg was sterilely prepped and draped in standard  fashion.  The leg was exsanguinated with the tourniquet inflated to 350  mmHg.   An anterior midline incision was then made from 4 fingerbreadths above the  patella to just medial to the tibial tubercle.  The total length  approximately 20 cm.  Skin flaps were mobilized circumferentially with  electrocautery used for hemostasis.  The electrocautery was then used to  make a medial parapatellar arthrotomy with patella everted and approximately  one-half of the fat pad excised. A medial release was performed releasing  the soft tissues in the proximal medial tibia due to the varus deformity.  The knee was flexed up with the patella everted and the remnants of the  menisci were then removed as were the cruciate ligaments.  A  drill was then  used to gain access to the femoral intramedullary canal followed by a canal  finder and then an intramedullary guide.   I made a 5-degree valgus cut removing 12-mm of bone from the distal femur.  The distal femur was then sized.  The size-5 had the best AP fit.  The size  5 cutting block was then applied to the distal femur and we made the  anterior-posterior chamfer cuts with an oscillating saw.  Two McHale  retractors were then placed and the proximal tibia was then exposed.  Using  the extramedullary guide we made the neutral cut removing 10-mm of bone from  the lateral plateau.  An appropriate amount of bone was noted to be resected  with the oscillating saw.  The proximal tibia was then incised and the size  6 had the best coverage.  This was then pinned into position and a trial  femoral component was then placed and reduction was performed with the knee  taken through a range of motion and good stability was found with the 12.5  trial.  We went  ahead and completed the preparation of the tibia with the  reamer and broach.   Attention then returned to the distal femur where the femoral notch cut was  made with the notch cutting guide using an oscillating saw.  We also used a  curved osteotome to remove osteophytes from the posterior aspect of the  medial and lateral femoral condyles.  The posterior compartments were then  debrided of any residual meniscal tissue and bony debris.   Our attention was then turned to the patella where it was measured and the  38-mm patella had the best coverage.  An oscillating saw was then used to  remove approximately 9-mm of bone.  The stabilizing drill holes were then  placed.  The drill was then pulsatile lavaged and meticulously cleaned. The  cement was then mixed on the back table and at the appropriate consistency,  the final implants were cemented into position beginning with the tibia and  then the femur, and then the patella.  All residual cement was meticulously  removed.  Trial reductions were, again, performed and the 12.5 had excellent  soft tissue balance.  The final 12.5-mm implant was opened and after the  joint was, once again, thoroughly inspected and any residual of the debris  was removed, the final tibial tray was placed into position with final  reduction.  The knee was again taken through range of motion.  The  preoperative flexion contracture had been nicely corrected and excellent hip  motion and stability of the knee.  The Hemovac drain was brought out  laterally.  The tourniquet was let down and hemostasis was obtained.  The  wound was then closed with interrupted figure-of-eight #1 Vicryl sutures for  the medial parapatellar arthrotomy, 2-0 Vicryl for the subcu and interrupted  __________ 3-0 Monocryl for the skin followed by Steri-Strips and a bulky  dry dressing.  Knee immobilizer was placed.  The patient was then transferred to the recovery room in stable  condition.                                               Vania Rea. Supple, M.D.    KMS/MEDQ  D:  04/28/2004  T:  04/28/2004  Job:  [id number]

## 2011-04-28 NOTE — Discharge Summary (Signed)
[name], [name] NO.:  [id number]   MEDICAL RECORD NO.:  [id number]          PATIENT TYPE:  INP   LOCATION:  5041                         FACILITY:  [facility]   PHYSICIAN:  Vania Rea. Supple, M.D.  DATE OF BIRTH:  1933/11/24   DATE OF ADMISSION:  05/31/2006  DATE OF DISCHARGE:  06/04/2006                                 DISCHARGE SUMMARY   ADMISSION DIAGNOSES:  1. End-stage osteoarthrosis, left knee.  2. Osteoarthrosis.  3. Benign prostatic hypertrophy.  4. Hypertension.  5. Hypercholesterolemia.   DISCHARGE DIAGNOSES:  1. End-stage osteoarthrosis, left knee.  2. Osteoarthrosis.  3. Benign prostatic hypertrophy.  4. Hypertension.  5. Hypercholesterolemia.  6. Mild postoperative nausea and vomiting.   OPERATIONS:  Left total knee arthroplasty.   SURGEON:  Dr. Francena Hanly.   ASSISTANT:  Ralene Bathe, PA.   ANESTHESIA:  Under general anesthetic.   BRIEF HISTORY:  [name] is a very pleasant 77 year old gentleman, well  known to [place], having previously recovered nicely from a right total knee  arthroplasty.  He has known end-stage osteoarthrosis of the left knee.  At  this time he is recovered and is ready to proceed with left total knee  arthroplasty as indicated.  Risks and benefits were discussed at length and  he is in agreement to proceed.   HOSPITAL COURSE:  The patient is admitted, underwent the above-named  procedure and tolerated this well.  All appropriate intravenous antibiotics  and analgesics were utilized.  Postoperatively, __________ was placed.  Weightbearing as tolerated and total knee replacement protocol with a CPM.  He did have some mild postoperative nausea and vomiting that improved.  Also  had mild hiccups that resolved.  He was able to be weaned off of intravenous  medications to p.o.  He progressed nicely with physical therapy and by day  June 04, 2006, he had reached discharge goals.  This time his hemoglobin had  been noted  to be stable at 11.3.  Vital signs were stable.  Left knee wound  was clean and dry with minimal swelling.  At this time he is stable for  discharge to home.  Follow up on an outpatient basis with home health OT and  PT.   LABORATORY DATA:  Admission labs of hemoglobin 14.7, postoperatively 12.7,  11.4, and 11.3.  Other labs are within normal limits.  Pro time INR is  followed by pharmacy.  Urinalysis was clear.  EKG showed a normal sinus  rhythm.  No change from a previous EKG May 2006.  Chest x-ray not indicated  on this admission.   CONDITION ON DISCHARGE:  Stable, improved.   DISCHARGE MEDICATIONS:  __________.   The patient is discharged to home.  He has home health PT and OT instituted.  He is weightbearing as tolerated.  Resume home meds, home diet.  Continue  Coumadin per pharmacy.  Prescription provided for Percocet, Restoril,  Robaxin, and Coumadin.  He may shower at this point.  May discontinue knee  immobilizer.  Follow up in the office two weeks postoperatively.  Call for a  time.      French Ana A. Shuford, P.A.-C.      Vania Rea. Supple, M.D.  Electronically Signed    TAS/MEDQ  D:  08/02/2006  T:  08/02/2006  Job:  [id number]

## 2011-04-28 NOTE — Op Note (Signed)
[name], [name]   MEDICAL RECORD NO.:  [name]          PATIENT TYPE:  INP   LOCATION:  2861                         FACILITY:  [facility]   PHYSICIAN:  Vania Rea. Supple, M.D.  DATE OF BIRTH:  1933-06-08   DATE OF PROCEDURE:  DATE OF DISCHARGE:                                 OPERATIVE REPORT   PREOPERATIVE DIAGNOSIS:  End-stage left knee osteoarthrosis.   POSTOPERATIVE DIAGNOSIS:  End-stage left knee osteoarthrosis.   PROCEDURE:  Cemented left total knee arthroplasty utilizing a DePuy Sigma  PFC implant size 5 femur, size 6 tibial tray, a 10 mm thick rotating  platform polyethylene insert and a 30 mm patellar button.   SURGEON OF RECORD:  Francena Hanly, M.D.   ASSISTANT:  French Ana Shuford PA-C.   ANESTHESIA:  1 hour 30 minutes.   ESTIMATED BLOOD LOSS:  Minimal.   DRAINS:  Hemovac x one.   HISTORY:  [name] is a 77 year old gentleman who has had chronic  bilateral knee pain and is recently status post right total knee  arthroplasty which he has done very well with.  His left knee continues to  be causing severe pain and functional limitations.  He is subsequently  brought to the operating at this time for planned left total knee  arthroplasty as described below.   Preoperatively I had counseled [name] on treatment options as well as  risks versus benefits thereof.  Possible surgical complications of bleeding,  infection, neurovascular injury, DVT, PE, persistent pain, loss motion and  possible need for revision surgery were all reviewed.  He understands and  accepts and agrees with our planned procedure.   PROCEDURE IN DETAIL:  After undergoing routine preop evaluation, the patient  received prophylactic antibiotics.  The femoral nerve block established in  the holding area by the Anesthesia Department.  Placed supine on the table  and underwent smooth induction of general endotracheal anesthesia.  Tourniquet applied to left  thigh.  Foley catheter was placed.  Left lower  extremity was then sterilely prepped and draped in standard fashion.  Leg  was exsanguinated with tourniquet inflated at 350 mmHg.  An anterior midline  incision was then made approximately 15 cm in length centered over the  patella.  Skin flaps are circumferentially mobilized.  Medial parapatellar  arthrotomy was then performed.  The patella was everted.  Infrapatellar fat  pat was excised as were remnants of cruciate ligament.  Knee was flexed up  with the patella everted and remnants of the menisci were removed.  A drill  was then used to gain access to the femoral intramedullary canal and then  the intramedullary guide was passed.  A 5 degree valgus cut was then made  removing 11 mm of bone from the distal femur.  The distal femur was then  sized and ultimately a size 5 had the best coverage.  The size 5 cutting jig  was then applied.  The anterior, posterior and chamfer cuts were then made  on the distal femur.  The femoral trial was then impacted.  There were  large  osteophytes on the posterior femoral condyles which were removed with an  osteotome and when this was completed, the femoral implant had excellent  fit.  Attention was then turned to the proximal tibia.  Using extramedullary  guide, a neutral tibial cut was made removing 10 mm of bone measured from  the lateral compartment.  The proximal tibial cut was then made with an  oscillating saw.  Once this was completed, we were able to remove the  remnants of the posterior horns of the medial and lateral menisci and also  the residual remnants of the cruciate ligament.  The proximal tibia was then  measured with a size 6 implant providing the best coverage.  The size 6 tray  was then pinned into position to be performed once again. A trial reduction  with implants and with this there was excellent soft tissue balance and good  stability of the knee through full range of motion.  The  knee was then once  again flexed up and the proximal tibia was then terminally prepared  utilizing the reamer guide to ream the canal.  Then the tibial keel was  broached.  The distal femur was then re-exposed and the box cutting guide  was applied.  The oscillating saw was then used to create the box cut on the  distal femur.  We then once again performed trial reduction with the boxed  femoral implant and the proximal tibial tray and the knee again showed  excellent soft tissue balance and good mobility and stability.  All trials  were then removed.  Attention was then turned to the patella.  Using an  oscillating saw we resected approximately 9 mm of bone of the articular  surface.  The stabilizing drill holes were then made.  At this point the  knee joint was pulse lavaged and meticulously cleaned.  I confirmed that all  residual posterior compartment soft tissue bone spurs were removed.  The  cement was then mixed on the back table and at the appropriate consistency,  we cemented the implants into position beginning with the tibia and then the  femur and then the patella.  Meticulous removal of all extra cement was then  completed.  Trial reduction was once again performed.  Good stability and  soft tissue balance was achieved.  A final 10 mm thick polyethylene tray was  then placed.  Final range of motion showed excellent mobility and excellent  stability.  Good soft tissue balance.  There was a tendency towards some  tightness of the lateral retinaculum and so a Bovie was then used to perform  a lateral release.  The tourniquet was then let down.  A Hemovac drain was  brought out laterally.  The incision was then closed with a series of figure-  of-eight #1 Vicryl sutures for the parapatellar arthrotomy and 2-0 Vicryl  for the subcu and intracuticular 3-0 Monocryl for the skin followed by Steri- Strips.  A dry dressing was placed over the knee followed by Ace bandage and  knee  immobilizer.  The patient was then extubated and taken to recovery room  in stable condition.      Vania Rea. Supple, M.D.  Electronically Signed     KMS/MEDQ  D:  05/31/2006  T:  05/31/2006  Job:  [id number]

## 2011-04-28 NOTE — Op Note (Signed)
[place]. [facility]  Patient:    [name], [name] Visit Number: [id number] MRN: [id number]          Service Type: DSU Location: [facility] Attending Physician:  Kendell Bane Dictated by:   Lowell Bouton, M.D. Admit Date:  09/11/2001                             Operative Report  PREOPERATIVE DIAGNOSIS:  [name] Quervains stenosing tenosynovitis, left wrist.  POSTOPERATIVE DIAGNOSIS:  [name] stenosing tenosynovitis, left wrist.  PROCEDURE:  Release of first dorsal compartment, left wrist.  SURGEON:  Lowell Bouton, M.D.  ANESTHESIA:  0.5% Marcaine local with sedation.  OPERATIVE FINDINGS:  The patient had one compartment with three tendons present.  There was tenosynovitis surrounding the tendons.  DESCRIPTION OF PROCEDURE:  Under 0.5% Marcaine local anesthesia with a tourniquet on the left arm, the left hand was prepped and draped in the usual fashion and after exsanguinating the limb, the tourniquet was inflated to 250 mmHg.  A longitudinal incision was made along the radial aspect of the left wrist.  Sharp dissection was carried through the subcutaneous tissues and bleeding points were coagulated.  Blunt dissection was carried down to the first dorsal compartment, which was incised sharply with a knife.  The remainder of the compartment was released with a scissors, and the tendons were identified in the compartment.  The EPB and the APL were both present in one compartment.  Care was taken to protect the radial sensory nerve.  After completely releasing the compartment, the wound was irrigated with saline. The skin was closed with a 3-0 subcuticular Prolene.  Steri-Strips were applied, followed by sterile dressings and a thumb spica splint.  The patient tolerated the procedure well and went to the recovery room awake and stable, in good condition. Dictated by:   Lowell Bouton, M.D. Attending Physician:   Kendell Bane DD:  09/11/01 TD:  09/12/01 Job: [id number]

## 2011-04-28 NOTE — Discharge Summary (Signed)
NAME:  [name], BASCOM                          ACCOUNT NO.:  [id number]   MEDICAL RECORD NO.:  [id number]                   PATIENT TYPE:  INP   LOCATION:  0473                                 FACILITY:  [facility]   PHYSICIAN:  Vania Rea. Supple, M.D.               DATE OF BIRTH:  07-Jul-1933   DATE OF ADMISSION:  04/28/2004  DATE OF DISCHARGE:  05/03/2004                                 DISCHARGE SUMMARY   ADMISSION DIAGNOSES:  1. End-stage osteoarthritis right knee.  2. Gastroesophageal reflux disease.  3. History of benign prostatic hypertrophy.   DISCHARGE DIAGNOSIS:  1. End-stage osteoarthritis right knee.  2. Gastroesophageal reflux disease.  3. History of benign prostatic hypertrophy.  4. Status post right total knee arthroplasty.   OPERATION:  Right total knee arthroplasty.  Surgeon:  Dr. Francena Hanly;  assistant:  Ralene Bathe, P.A.-C.; under spinal anesthetic.   BRIEF HISTORY:  Mr. Lafitte is a very pleasant 77 year old male who has  failed outpatient conservative management for end-stage osteoarthritis of  the right knee.  He has demonstrated bone-on-bone changes by radiographs and  is having painful arc motion and painful joint lines and difficulty  ambulating secondary to pain.  At this time he wishes to proceed with total  knee arthroplasty as indicated.  Risks and benefits discussed at length.   HOSPITAL COURSE:  The patient was admitted and underwent the above-named  procedure and tolerated this well.  All appropriate IV antibiotics and  analgesics were utilized.  Postoperatively he was placed on weightbearing as  tolerated, total knee protocol, CPM use, and Coumadin for DVT and PE  prophylaxis.  He had 24 hours of IV antibiotics.  He did have some nausea  and vomiting with his postoperative IV Dilaudid which was changed to  morphine with good result.  All in all the patient did extremely well  postoperatively.  He remained hemodynamically stable.  He tolerated  therapy  and did well and progressed to meet his goals for discharge to home.  On May 03, 2004 he had been having some mild elevated temperatures.  A chest x-ray  was checked and it showed no active disease.  His wound was clean, dry, and  intact.  He was voiding without difficulty and had a bowel movement.  At  this time he was felt medically and orthopedically stable for discharge  home.   LABORATORY VALUES:  Admission labs:  Admission hemoglobin 14.6, down to 11.3  postoperatively and stable.  Chemistries also within normal limits but  mildly elevated glucose between 137 and 153.  Coag times followed by  pharmacy for Coumadin DVT and PE prophylaxis.  Urinalysis shows trace  hemoglobin and bilirubin.  Blood type O positive.  X-rays:  Chest film on  admission shows no active disease.  A postoperative film on May 23 shows no  active disease.   CONDITION ON DISCHARGE:  Stable and  improved.   DISCHARGE MEDICATIONS AND PLANS:  The patient is being discharged to the  care of his family.  He is weightbearing as tolerated.  Home health PT and  R.N. arranged.  Prescriptions for Percocet, Robaxin, and Restoril are  provided.  He is also on Coumadin.  He may shower at this time.  Over-the-  counter stool softeners recommended.  Follow up at 2 weeks; call for time.  Resume home medications and call for any other questions.     Tracy A. Shuford, P.A.-C.                 Vania Rea. Supple, M.D.    TAS/MEDQ  D:  06/09/2004  T:  06/09/2004  Job:  [id number]

## 2011-05-09 ENCOUNTER — Other Ambulatory Visit: Payer: Self-pay | Admitting: Internal Medicine

## 2011-05-29 ENCOUNTER — Other Ambulatory Visit (INDEPENDENT_AMBULATORY_CARE_PROVIDER_SITE_OTHER): Payer: Medicare Other

## 2011-05-29 DIAGNOSIS — I10 Essential (primary) hypertension: Secondary | ICD-10-CM

## 2011-05-29 DIAGNOSIS — E785 Hyperlipidemia, unspecified: Secondary | ICD-10-CM

## 2011-05-29 DIAGNOSIS — E119 Type 2 diabetes mellitus without complications: Secondary | ICD-10-CM

## 2011-05-29 LAB — LIPID PANEL: VLDL: 9.2 mg/dL (ref 0.0–40.0)

## 2011-05-29 LAB — HEPATIC FUNCTION PANEL
ALT: 32 U/L (ref 0–53)
AST: 36 U/L (ref 0–37)
Bilirubin, Direct: 0.2 mg/dL (ref 0.0–0.3)
Total Bilirubin: 0.8 mg/dL (ref 0.3–1.2)

## 2011-05-29 LAB — HEMOGLOBIN A1C: Hgb A1c MFr Bld: 7.3 % — ABNORMAL HIGH (ref 4.6–6.5)

## 2011-05-29 LAB — BASIC METABOLIC PANEL
BUN: 18 mg/dL (ref 6–23)
Calcium: 9.1 mg/dL (ref 8.4–10.5)
Creatinine, Ser: 0.8 mg/dL (ref 0.4–1.5)
GFR: 94.51 mL/min (ref >60.00)

## 2011-06-05 ENCOUNTER — Encounter: Payer: Self-pay | Admitting: Internal Medicine

## 2011-06-05 ENCOUNTER — Ambulatory Visit (INDEPENDENT_AMBULATORY_CARE_PROVIDER_SITE_OTHER): Payer: Medicare Other | Admitting: Internal Medicine

## 2011-06-05 DIAGNOSIS — E785 Hyperlipidemia, unspecified: Secondary | ICD-10-CM

## 2011-06-05 DIAGNOSIS — I10 Essential (primary) hypertension: Secondary | ICD-10-CM

## 2011-06-05 DIAGNOSIS — E119 Type 2 diabetes mellitus without complications: Secondary | ICD-10-CM

## 2011-06-05 NOTE — Assessment & Plan Note (Signed)
Fair control curently on no meds Advised more aggressive dietary control

## 2011-06-05 NOTE — Assessment & Plan Note (Signed)
Well controlled Continue same meds 

## 2011-06-05 NOTE — Progress Notes (Signed)
  Subjective:    Patient ID: [name], male    DOB: 09-24-1933, 77 y.o.   MRN: [id number]  HPI  patient comes in for followup of multiple medical problems including type 2 diabetes, hyperlipidemia, hypertension. The patient does not check blood sugar or blood pressure at home. The patetient does not follow an exercise or diet program. The patient denies any polyuria, polydipsia.  In the past the patient has gone to diabetic treatment center. The patient is tolerating medications  Without difficulty. The patient does admit to medication compliance.   Past Medical History  Diagnosis Date  . Diabetes mellitus     type 2  . Diverticulosis of colon   . GERD (gastroesophageal reflux disease)   . Osteoarthritis   . Pneumonia 2002    history of  . Stress incontinence, male     urge  . Dyslipidemia   . Hernia, hiatal   . Hyperlipidemia    Past Surgical History  Procedure Date  . Wrist tendon surgery   . Cts bilateral   . Knee arthroscopy 1988, 1992    bilateral  . Total knee arthroplasty 2005, 2007  . Lumbar left arm 1961    reports that he has never smoked. He does not have any smokeless tobacco history on file. He reports that he does not drink alcohol or use illicit drugs. family history includes Other (age of onset:80) in his mother. Allergies  Allergen Reactions  . Ciprofloxacin     REACTION: unspecified  . Sulfamethoxazole     REACTION: rash    Review of Systems  patient denies chest pain, shortness of breath, orthopnea. Denies lower extremity edema, abdominal pain, change in appetite, change in bowel movements. Patient denies rashes, musculoskeletal complaints. No other specific complaints in a complete review of systems.      Objective:   Physical Exam  well-developed well-nourished male in no acute distress. HEENT exam atraumatic, normocephalic, neck supple without jugular venous distention. Chest clear to auscultation cardiac exam S1-S2 are regular. Abdominal exam  overweight with bowel sounds, soft and nontender. Extremities no edema. Neurologic exam is alert with a normal gait.      Assessment & Plan:

## 2011-06-05 NOTE — Assessment & Plan Note (Signed)
Adequate control Continue meds Home bps 120s/60s

## 2011-06-23 ENCOUNTER — Other Ambulatory Visit: Payer: Self-pay | Admitting: Internal Medicine

## 2011-07-26 ENCOUNTER — Other Ambulatory Visit: Payer: Self-pay | Admitting: Internal Medicine

## 2011-10-03 ENCOUNTER — Other Ambulatory Visit (INDEPENDENT_AMBULATORY_CARE_PROVIDER_SITE_OTHER): Payer: Medicare Other

## 2011-10-03 DIAGNOSIS — E119 Type 2 diabetes mellitus without complications: Secondary | ICD-10-CM

## 2011-10-03 LAB — HEPATIC FUNCTION PANEL
ALT: 36 U/L (ref 0–53)
Total Bilirubin: 0.7 mg/dL (ref 0.3–1.2)
Total Protein: 7.6 g/dL (ref 6.0–8.3)

## 2011-10-03 LAB — BASIC METABOLIC PANEL
BUN: 18 mg/dL (ref 6–23)
CO2: 30 mEq/L (ref 19–32)
Chloride: 102 mEq/L (ref 96–112)
Creatinine, Ser: 0.9 mg/dL (ref 0.4–1.5)
Potassium: 4.6 mEq/L (ref 3.5–5.1)

## 2011-10-03 LAB — LIPID PANEL
Cholesterol: 164 mg/dL (ref 0–200)
HDL: 47.1 mg/dL
LDL Cholesterol: 101 mg/dL — ABNORMAL HIGH (ref 0–99)
Total CHOL/HDL Ratio: 3
Triglycerides: 80 mg/dL (ref 0.0–149.0)
VLDL: 16 mg/dL (ref 0.0–40.0)

## 2011-10-03 LAB — HEMOGLOBIN A1C: Hgb A1c MFr Bld: 7.2 % — ABNORMAL HIGH (ref 4.6–6.5)

## 2011-10-06 ENCOUNTER — Ambulatory Visit (INDEPENDENT_AMBULATORY_CARE_PROVIDER_SITE_OTHER): Payer: Medicare Other

## 2011-10-06 DIAGNOSIS — Z23 Encounter for immunization: Secondary | ICD-10-CM

## 2011-10-10 ENCOUNTER — Ambulatory Visit: Payer: Medicare Other | Admitting: Internal Medicine

## 2011-10-23 ENCOUNTER — Other Ambulatory Visit: Payer: Self-pay | Admitting: Internal Medicine

## 2011-12-12 LAB — HM DIABETES EYE EXAM

## 2011-12-29 ENCOUNTER — Other Ambulatory Visit: Payer: Self-pay | Admitting: Internal Medicine

## 2012-01-25 ENCOUNTER — Other Ambulatory Visit (INDEPENDENT_AMBULATORY_CARE_PROVIDER_SITE_OTHER): Payer: Medicare Other

## 2012-01-25 DIAGNOSIS — E785 Hyperlipidemia, unspecified: Secondary | ICD-10-CM

## 2012-01-25 LAB — LIPID PANEL
Cholesterol: 143 mg/dL (ref 0–200)
HDL: 40.9 mg/dL (ref >39.00)
LDL Cholesterol: 88 mg/dL (ref 0–99)
VLDL: 14.2 mg/dL (ref 0.0–40.0)

## 2012-01-25 LAB — HEPATIC FUNCTION PANEL
ALT: 38 U/L (ref 0–53)
Bilirubin, Direct: 0.1 mg/dL (ref 0.0–0.3)
Total Bilirubin: 1 mg/dL (ref 0.3–1.2)

## 2012-01-25 LAB — BASIC METABOLIC PANEL
Chloride: 103 mEq/L (ref 96–112)
Creatinine, Ser: 0.8 mg/dL (ref 0.4–1.5)
Potassium: 3.9 mEq/L (ref 3.5–5.1)

## 2012-02-06 ENCOUNTER — Ambulatory Visit: Payer: Medicare Other | Admitting: Internal Medicine

## 2012-02-08 ENCOUNTER — Encounter: Payer: Self-pay | Admitting: Internal Medicine

## 2012-02-08 ENCOUNTER — Ambulatory Visit (INDEPENDENT_AMBULATORY_CARE_PROVIDER_SITE_OTHER): Payer: Medicare Other | Admitting: Internal Medicine

## 2012-02-08 VITALS — BP 136/74 | HR 76 | Temp 98.2°F | Ht 70.0 in | Wt 212.0 lb

## 2012-02-08 DIAGNOSIS — E785 Hyperlipidemia, unspecified: Secondary | ICD-10-CM

## 2012-02-08 DIAGNOSIS — I10 Essential (primary) hypertension: Secondary | ICD-10-CM

## 2012-02-08 DIAGNOSIS — E119 Type 2 diabetes mellitus without complications: Secondary | ICD-10-CM

## 2012-02-08 NOTE — Progress Notes (Signed)
Patient ID: [name], male   DOB: 1933/10/09, 78 y.o.   MRN: [id number]  patient comes in for followup of multiple medical problems including type 2 diabetes, hyperlipidemia, hypertension. The patient does not check blood sugar or blood pressure at home. The patetient does not follow an exercise or diet program. The patient denies any polyuria, polydipsia.  In the past the patient has gone to diabetic treatment center. The patient is tolerating medications  Without difficulty. The patient does admit to medication compliance.  Past Medical History  Diagnosis Date  . Diabetes mellitus     type 2  . Diverticulosis of colon   . GERD (gastroesophageal reflux disease)   . Osteoarthritis   . Pneumonia 2002    history of  . Stress incontinence, male     urge  . Dyslipidemia   . Hernia, hiatal   . Hyperlipidemia     History   Social History  . Marital Status: Married    Spouse Name: N/A    Number of Children: N/A  . Years of Education: N/A   Occupational History  . Not on file.   Social History Main Topics  . Smoking status: Never Smoker   . Smokeless tobacco: Not on file  . Alcohol Use: No  . Drug Use: No  . Sexually Active:    Other Topics Concern  . Not on file   Social History Narrative  . No narrative on file    Past Surgical History  Procedure Date  . Wrist tendon surgery   . Cts bilateral   . Knee arthroscopy 1988, 1992    bilateral  . Total knee arthroplasty 2005, 2007  . Lumbar left arm 1961    Family History  Problem Relation Age of Onset  . Other Mother 40    CABG    Allergies  Allergen Reactions  . Ciprofloxacin     REACTION: unspecified  . Sulfamethoxazole     REACTION: rash    Current Outpatient Prescriptions on File Prior to Visit  Medication Sig Dispense Refill  . amLODipine (NORVASC) 5 MG tablet TAKE ONE TABLET BY MOUTH DAILY  90 tablet  5  . aspirin 81 MG tablet Take 81 mg by mouth daily.        Marland Kitchen doxazosin (CARDURA) 4 MG tablet  TAKE ONE TABLET BY MOUTH DAILY  90 tablet  3  . etodolac (LODINE) 200 MG capsule TAKE ONE CAPSULE BY MOUTH TWO TIMES A DAY  60 capsule  5  . multivitamin (THERAGRAN) per tablet Take 1 tablet by mouth daily.        Marland Kitchen PRILOSEC OTC 20 MG tablet TAKE ONE TABLET BY MOUTH DAILY  90 each  3  . simvastatin (ZOCOR) 20 MG tablet TAKE ONE TABLET BY MOUTH DAILY  90 tablet  3     patient denies chest pain, shortness of breath, orthopnea. Denies lower extremity edema, abdominal pain, change in appetite, change in bowel movements. Patient denies rashes, musculoskeletal complaints. No other specific complaints in a complete review of systems.   BP 136/74  Pulse 76  Temp(Src) 98.2 F (36.8 C) (Oral)  Ht 5\' 10"  (1.778 m)  Wt 212 lb (96.163 kg)  BMI 30.42 kg/m2  well-developed well-nourished male in no acute distress. HEENT exam atraumatic, normocephalic, neck supple without jugular venous distention. Chest clear to auscultation cardiac exam S1-S2 are regular. Abdominal exam overweight with bowel sounds, soft and nontender. Extremities no edema. Neurologic exam is alert with a normal  gait.

## 2012-02-08 NOTE — Assessment & Plan Note (Signed)
Lab Results  Component Value Date   HGBA1C 7.1* 01/25/2012   Well controlled Continue same meds Wt Readings from Last 3 Encounters:  02/08/12 212 lb (96.163 kg)  06/05/11 211 lb (95.709 kg)  01/30/11 213 lb (96.616 kg)

## 2012-02-08 NOTE — Assessment & Plan Note (Signed)
Controlled Continue same meds 

## 2012-02-08 NOTE — Assessment & Plan Note (Signed)
BP Readings from Last 3 Encounters:  02/08/12 136/74  06/05/11 138/76  01/30/11 132/70   Reasonable control, continue same meds

## 2012-03-22 ENCOUNTER — Other Ambulatory Visit: Payer: Self-pay | Admitting: Internal Medicine

## 2012-04-15 ENCOUNTER — Other Ambulatory Visit: Payer: Self-pay | Admitting: Internal Medicine

## 2012-05-17 ENCOUNTER — Other Ambulatory Visit: Payer: Self-pay | Admitting: Internal Medicine

## 2012-07-11 LAB — HM DIABETES EYE EXAM

## 2012-07-19 ENCOUNTER — Ambulatory Visit (INDEPENDENT_AMBULATORY_CARE_PROVIDER_SITE_OTHER): Payer: Medicare Other | Admitting: Family Medicine

## 2012-07-19 ENCOUNTER — Encounter: Payer: Self-pay | Admitting: Family Medicine

## 2012-07-19 VITALS — BP 140/72 | Temp 97.8°F | Wt 215.0 lb

## 2012-07-19 DIAGNOSIS — R05 Cough: Secondary | ICD-10-CM

## 2012-07-19 MED ORDER — HYDROCODONE-HOMATROPINE 5-1.5 MG/5ML PO SYRP: 5.0000 mL | ORAL_SOLUTION | Freq: Four times a day (QID) | ORAL | Status: AC | PRN

## 2012-07-19 MED ORDER — AMOXICILLIN 875 MG PO TABS: 875.0000 mg | ORAL_TABLET | Freq: Two times a day (BID) | ORAL | Status: AC

## 2012-07-19 NOTE — Patient Instructions (Addendum)
Follow up promptly for any fever or worsening symptoms 

## 2012-07-19 NOTE — Progress Notes (Signed)
  Subjective:    Patient ID: [name], male    DOB: [birth year], 78 y.o.   MRN: [id number]  HPI  Nonsmoker with ten-day history cough. Cough productive yellow sputum. No fever. No dyspnea. Now has bilateral maxillary facial pain and pressure. Robitussin-DM without relief. No GERD symptoms. No wheezing. No ACE inhibitor use. No chronic lung disease. Cough especially bothersome at night. Type 2 diabetes which is controlled. Allergy to Cipro and sulfa  Past Medical History  Diagnosis Date  . Diabetes mellitus     type 2  . Diverticulosis of colon   . GERD (gastroesophageal reflux disease)   . Osteoarthritis   . Pneumonia 2002    history of  . Stress incontinence, male     urge  . Dyslipidemia   . Hernia, hiatal   . Hyperlipidemia    Past Surgical History  Procedure Date  . Wrist tendon surgery   . Cts bilateral   . Knee arthroscopy 1988, 1992    bilateral  . Total knee arthroplasty 2005, 2007  . Lumbar left arm 1961    reports that he has never smoked. He does not have any smokeless tobacco history on file. He reports that he does not drink alcohol or use illicit drugs. family history includes Other (age of onset:80) in his mother. Allergies  Allergen Reactions  . Ciprofloxacin     REACTION: unspecified  . Sulfamethoxazole     REACTION: rash     Review of Systems  Constitutional: Negative for fever and chills.  HENT: Positive for sinus pressure. Negative for postnasal drip.   Respiratory: Positive for cough. Negative for shortness of breath and wheezing.   Cardiovascular: Negative for chest pain.  Neurological: Negative for syncope and headaches.       Objective:   Physical Exam  Constitutional: He appears well-developed and well-nourished.  HENT:  Right Ear: External ear normal.  Left Ear: External ear normal.  Mouth/Throat: Oropharynx is clear and moist.  Neck: Neck supple.  Cardiovascular: Normal rate and regular rhythm.   Pulmonary/Chest: Effort  normal and breath sounds normal. No respiratory distress. He has no wheezes. He has no rales.  Lymphadenopathy:    He has no cervical adenopathy.          Assessment & Plan:  Cough. Question viral. Question acute sinusitis. Hycodan cough syrup 1 teaspoon every 6 hours when necessary. Amoxicillin 875 mg twice a day for 10 days

## 2012-07-31 ENCOUNTER — Other Ambulatory Visit (INDEPENDENT_AMBULATORY_CARE_PROVIDER_SITE_OTHER): Payer: Medicare Other

## 2012-07-31 DIAGNOSIS — E119 Type 2 diabetes mellitus without complications: Secondary | ICD-10-CM

## 2012-07-31 LAB — BASIC METABOLIC PANEL
BUN: 17 mg/dL (ref 6–23)
Calcium: 9.7 mg/dL (ref 8.4–10.5)
GFR: 110.79 mL/min (ref >60.00)
Glucose, Bld: 162 mg/dL — ABNORMAL HIGH (ref 70–99)
Potassium: 4.9 mEq/L (ref 3.5–5.1)

## 2012-07-31 LAB — LIPID PANEL
HDL: 34.3 mg/dL — ABNORMAL LOW (ref >39.00)
Total CHOL/HDL Ratio: 3

## 2012-07-31 LAB — HEPATIC FUNCTION PANEL
AST: 39 U/L — ABNORMAL HIGH (ref 0–37)
Total Bilirubin: 0.7 mg/dL (ref 0.3–1.2)

## 2012-08-07 ENCOUNTER — Ambulatory Visit (INDEPENDENT_AMBULATORY_CARE_PROVIDER_SITE_OTHER): Payer: Medicare Other | Admitting: Internal Medicine

## 2012-08-07 ENCOUNTER — Encounter: Payer: Self-pay | Admitting: Internal Medicine

## 2012-08-07 ENCOUNTER — Ambulatory Visit: Payer: Medicare Other | Admitting: Internal Medicine

## 2012-08-07 VITALS — BP 132/74 | HR 64 | Temp 98.4°F | Wt 203.0 lb

## 2012-08-07 DIAGNOSIS — I10 Essential (primary) hypertension: Secondary | ICD-10-CM

## 2012-08-07 DIAGNOSIS — E119 Type 2 diabetes mellitus without complications: Secondary | ICD-10-CM

## 2012-08-07 DIAGNOSIS — G609 Hereditary and idiopathic neuropathy, unspecified: Secondary | ICD-10-CM

## 2012-08-07 MED ORDER — METFORMIN HCL 500 MG PO TABS: 500.0000 mg | ORAL_TABLET | Freq: Every day | ORAL | Status: DC

## 2012-08-07 NOTE — Progress Notes (Signed)
Patient ID: [name], male   DOB: 01/24/1933, 78 y.o.   MRN: [id number]   patient comes in for followup of multiple medical problems including type 2 diabetes, hyperlipidemia, hypertension. The patient does not check blood sugar or blood pressure at home. The patetient does not follow an exercise or diet program. The patient denies any polyuria, polydipsia.  In the past the patient has gone to diabetic treatment center. The patient is tolerating medications  Without difficulty. The patient does admit to medication compliance.   Past Medical History  Diagnosis Date  . Diabetes mellitus     type 2  . Diverticulosis of colon   . GERD (gastroesophageal reflux disease)   . Osteoarthritis   . Pneumonia 2002    history of  . Stress incontinence, male     urge  . Dyslipidemia   . Hernia, hiatal   . Hyperlipidemia     History   Social History  . Marital Status: Married    Spouse Name: N/A    Number of Children: N/A  . Years of Education: N/A   Occupational History  . Not on file.   Social History Main Topics  . Smoking status: Never Smoker   . Smokeless tobacco: Not on file  . Alcohol Use: No  . Drug Use: No  . Sexually Active:    Other Topics Concern  . Not on file   Social History Narrative  . No narrative on file    Past Surgical History  Procedure Date  . Wrist tendon surgery   . Cts bilateral   . Knee arthroscopy 1988, 1992    bilateral  . Total knee arthroplasty 2005, 2007  . Lumbar left arm 1961    Family History  Problem Relation Age of Onset  . Other Mother 6    CABG    Allergies  Allergen Reactions  . Ciprofloxacin     REACTION: unspecified  . Sulfamethoxazole     REACTION: rash    Current Outpatient Prescriptions on File Prior to Visit  Medication Sig Dispense Refill  . amLODipine (NORVASC) 5 MG tablet TAKE ONE TABLET BY MOUTH DAILY  90 tablet  4  . aspirin 81 MG tablet Take 81 mg by mouth daily.        Marland Kitchen doxazosin (CARDURA) 4 MG tablet  TAKE ONE TABLET BY MOUTH DAILY  90 tablet  1  . multivitamin (THERAGRAN) per tablet Take 1 tablet by mouth daily.        Marland Kitchen omeprazole (PRILOSEC) 20 MG capsule TAKE ONE CAPSULE BY MOUTH DAILY  90 capsule  1  . simvastatin (ZOCOR) 20 MG tablet TAKE ONE TABLET BY MOUTH DAILY  90 tablet  2     patient denies chest pain, shortness of breath, orthopnea. Denies lower extremity edema, abdominal pain, change in appetite, change in bowel movements. Patient denies rashes, musculoskeletal complaints. No other specific complaints in a complete review of systems.   BP 132/74  Pulse 64  Temp 98.4 F (36.9 C) (Oral)  Wt 203 lb (92.08 kg)  well-developed well-nourished male in no acute distress. HEENT exam atraumatic, normocephalic, neck supple without jugular venous distention. Chest clear to auscultation cardiac exam S1-S2 are regular. Abdominal exam overweight with bowel sounds, soft and nontender. Extremities no edema. Neurologic exam is alert with a normal gait.

## 2012-08-07 NOTE — Assessment & Plan Note (Signed)
i suspect his neuropathy is related to DM Probably need to start medication-- start low dose metformin

## 2012-08-11 NOTE — Assessment & Plan Note (Signed)
Very likely related to DM

## 2012-08-11 NOTE — Assessment & Plan Note (Signed)
BP Readings from Last 3 Encounters:  08/07/12 132/74  07/19/12 140/72  02/08/12 136/74   Adequate control Continue meds

## 2012-09-13 ENCOUNTER — Ambulatory Visit (INDEPENDENT_AMBULATORY_CARE_PROVIDER_SITE_OTHER): Payer: Medicare Other

## 2012-09-13 DIAGNOSIS — Z23 Encounter for immunization: Secondary | ICD-10-CM

## 2012-09-16 ENCOUNTER — Other Ambulatory Visit: Payer: Self-pay | Admitting: Internal Medicine

## 2012-09-17 ENCOUNTER — Other Ambulatory Visit: Payer: Self-pay | Admitting: *Deleted

## 2012-09-17 MED ORDER — DOXAZOSIN MESYLATE 4 MG PO TABS: 4.0000 mg | ORAL_TABLET | Freq: Every day | ORAL | Status: DC

## 2012-10-03 ENCOUNTER — Other Ambulatory Visit: Payer: Self-pay | Admitting: Internal Medicine

## 2012-12-20 ENCOUNTER — Encounter: Payer: Self-pay | Admitting: Family Medicine

## 2012-12-20 ENCOUNTER — Ambulatory Visit (INDEPENDENT_AMBULATORY_CARE_PROVIDER_SITE_OTHER): Payer: Medicare Other | Admitting: Family Medicine

## 2012-12-20 ENCOUNTER — Encounter (INDEPENDENT_AMBULATORY_CARE_PROVIDER_SITE_OTHER): Payer: Medicare Other

## 2012-12-20 VITALS — BP 140/70 | Temp 98.3°F | Wt 191.0 lb

## 2012-12-20 DIAGNOSIS — M7989 Other specified soft tissue disorders: Secondary | ICD-10-CM

## 2012-12-20 DIAGNOSIS — R609 Edema, unspecified: Secondary | ICD-10-CM

## 2012-12-20 DIAGNOSIS — R600 Localized edema: Secondary | ICD-10-CM

## 2012-12-20 DIAGNOSIS — M79609 Pain in unspecified limb: Secondary | ICD-10-CM

## 2012-12-20 NOTE — Progress Notes (Signed)
  Subjective:    Patient ID: [name], male    DOB: 07/26/1933, 79 y.o.   MRN: [id number]  HPI Right lower extremity edema. He relates about one month ago he fell. No bruising. About 3 weeks ago he noticed some increased swelling right leg somewhat intermittent but especially persistent and worsening past few days. He relates remote history of DVT left lower extremity several years ago. He has past history of right total knee replacement several years ago. No erythema or warmth. No dyspnea. No chest pain. No pleuritic pain. No hemoptysis.  Chronic problems include type 2 diabetes, hypertension, GERD, osteoarthritis  Past Medical History  Diagnosis Date  . Diabetes mellitus     type 2  . Diverticulosis of colon   . GERD (gastroesophageal reflux disease)   . Osteoarthritis   . Pneumonia 2002    history of  . Stress incontinence, male     urge  . Dyslipidemia   . Hernia, hiatal   . Hyperlipidemia    Past Surgical History  Procedure Date  . Wrist tendon surgery   . Cts bilateral   . Knee arthroscopy 1988, 1992    bilateral  . Total knee arthroplasty 2005, 2007  . Lumbar left arm 1961    reports that he has never smoked. He does not have any smokeless tobacco history on file. He reports that he does not drink alcohol or use illicit drugs. family history includes Other (age of onset:80) in his mother. Allergies  Allergen Reactions  . Ciprofloxacin     REACTION: unspecified  . Sulfamethoxazole     REACTION: rash      Review of Systems  Constitutional: Negative for fever and chills.  Respiratory: Negative for cough and shortness of breath.   Cardiovascular: Positive for leg swelling. Negative for chest pain and palpitations.  Gastrointestinal: Negative for abdominal pain.  Musculoskeletal: Negative for gait problem.  Neurological: Negative for dizziness.       Objective:   Physical Exam  Constitutional: He appears well-developed and well-nourished.    Cardiovascular: Normal rate.   Pulmonary/Chest: Effort normal and breath sounds normal. No respiratory distress. He has no wheezes. He has no rales.  Musculoskeletal:       Patient has increased edema right lower extremity compared to left. He has some diffuse varicosities. Minimal tenderness right calf. He has scars from previous right knee surgery. Full range of motion right knee with no warmth. Some prominent swelling popliteal area right knee consistent with possible Baker's cyst. No color changes right lower extremity compared with left          Assessment & Plan:  Asymmetric leg edema right greater than left. Question Baker's cyst. Rule out DVT. Venous Dopplers

## 2012-12-24 ENCOUNTER — Telehealth: Payer: Self-pay | Admitting: Family Medicine

## 2012-12-24 NOTE — Telephone Encounter (Signed)
Pt's family would like to hear from MD about a diagnosis, and/or a next step for pt. Pt is not any better, no improvement, leg swollen.

## 2012-12-25 NOTE — Telephone Encounter (Signed)
No DVT which was main acute thing to rule out.  Since he has had prior TKR, I would recommend follow up with his orthopedist for persistent edema.

## 2012-12-25 NOTE — Telephone Encounter (Signed)
Pt daughter Arline Asp informed

## 2013-01-01 ENCOUNTER — Telehealth: Payer: Self-pay | Admitting: Internal Medicine

## 2013-01-01 DIAGNOSIS — R600 Localized edema: Secondary | ICD-10-CM

## 2013-01-01 NOTE — Telephone Encounter (Signed)
We can set up to see Dr Arbie Cookey but since his venous doppler was neg for clot, I recommended consider orthopedic follow up-he has had prior TKR.

## 2013-01-01 NOTE — Telephone Encounter (Signed)
Pt has already seen orthopedics so I put in the referral for Dr Arbie Cookey

## 2013-01-01 NOTE — Telephone Encounter (Signed)
Dr Cato Mulligan is out of the office all week.

## 2013-01-01 NOTE — Telephone Encounter (Signed)
Pt wife is requesting a referral to Dr Arbie Cookey Marilynn Latino [phone] for leg swelling . Pt saw Dr Caryl Never on 12-20-2012 for this problem.

## 2013-01-06 ENCOUNTER — Other Ambulatory Visit: Payer: Self-pay | Admitting: *Deleted

## 2013-01-06 DIAGNOSIS — I83893 Varicose veins of bilateral lower extremities with other complications: Secondary | ICD-10-CM

## 2013-01-08 ENCOUNTER — Encounter: Payer: Self-pay | Admitting: Vascular Surgery

## 2013-01-09 ENCOUNTER — Encounter: Payer: Self-pay | Admitting: Vascular Surgery

## 2013-01-09 ENCOUNTER — Ambulatory Visit (INDEPENDENT_AMBULATORY_CARE_PROVIDER_SITE_OTHER): Payer: Medicare Other | Admitting: Vascular Surgery

## 2013-01-09 VITALS — BP 152/84 | HR 64 | Resp 20 | Ht 70.0 in | Wt 190.0 lb

## 2013-01-09 DIAGNOSIS — I83893 Varicose veins of bilateral lower extremities with other complications: Secondary | ICD-10-CM

## 2013-01-09 HISTORY — DX: Varicose veins of bilateral lower extremities with other complications: I83.893

## 2013-01-09 NOTE — Progress Notes (Signed)
[facility]   Patient name: [name] MRN: [id number] DOB: 1933-01-08 Sex: male   Referred by: Caryl Never  Reason for referral:  Chief Complaint  Patient presents with  . Varicose Veins    NEW VV  RIGHT LEG PAIN AND SWELLING    HISTORY OF PRESENT ILLNESS: Patient is an active 79 year old gentleman who was evaluated for swelling in his right leg. He is here today with his wife. He has a remote history of right knee replacement possibly 7 years ago. He did have a fall several weeks ago and initially had no issues related to this but then noted significant swelling in his ankle and calf. There was no significant bruising. He did undergo a venous duplex which I have the report of have reviewed with the patient. Today this was 12/20/2012. This showed no evidence of DVT and also no evidence of valvular incompetence. He does report a history of DVT. On further discussion of this he does have what sounds like an area of superficial thrombophlebitis only in his left ankle several years ago. He was not treated with anticoagulation for a time and by his history this was superficial thrombophlebitis.  Past Medical History  Diagnosis Date  . Diabetes mellitus     type 2  . Diverticulosis of colon   . GERD (gastroesophageal reflux disease)   . Osteoarthritis   . Pneumonia 2002    history of  . Stress incontinence, male     urge  . Dyslipidemia   . Hernia, hiatal   . Hyperlipidemia   . Hypertension   . DVT (deep venous thrombosis)     Past Surgical History  Procedure Date  . Wrist tendon surgery   . Cts bilateral   . Knee arthroscopy 1988, 1992    bilateral  . Total knee arthroplasty 2005, 2007  . Lumbar left arm 1961    History   Social History  . Marital Status: Married    Spouse Name: N/A    Number of Children: N/A  . Years of Education: N/A   Occupational History  . Not on file.   Social History Main Topics  . Smoking status: Never  Smoker   . Smokeless tobacco: Never Used  . Alcohol Use: No  . Drug Use: No  . Sexually Active: Not on file   Other Topics Concern  . Not on file   Social History Narrative  . No narrative on file    Family History  Problem Relation Age of Onset  . Other Mother 54    CABG  . Heart disease Mother   . Hypertension Mother   . Hyperlipidemia Son     Allergies as of 01/09/2013 - Review Complete 01/09/2013  Allergen Reaction Noted  . Ciprofloxacin  01/21/2007  . Sulfamethoxazole  06/13/2007    Current Outpatient Prescriptions on File Prior to Visit  Medication Sig Dispense Refill  . amLODipine (NORVASC) 5 MG tablet TAKE ONE TABLET BY MOUTH DAILY  90 tablet  4  . aspirin 81 MG tablet Take 81 mg by mouth daily.        Marland Kitchen doxazosin (CARDURA) 4 MG tablet Take 1 tablet (4 mg total) by mouth daily.  90 tablet  1  . metFORMIN (GLUCOPHAGE) 500 MG tablet Take 1 tablet (500 mg total) by mouth daily with breakfast.  90 tablet  3  . omeprazole (PRILOSEC) 20 MG capsule TAKE ONE CAPSULE BY MOUTH DAILY  90 capsule  0  .  simvastatin (ZOCOR) 20 MG tablet TAKE ONE TABLET BY MOUTH DAILY  90 tablet  2  . acetaminophen (TYLENOL ARTHRITIS PAIN) 650 MG CR tablet Take 650 mg by mouth every 8 (eight) hours as needed.      . calcium-vitamin D (OSCAL WITH D) 500-200 MG-UNIT per tablet Take 1 tablet by mouth daily.      . cyanocobalamin 100 MCG tablet Take 100 mcg by mouth daily.      . multivitamin (THERAGRAN) per tablet Take 1 tablet by mouth daily.           REVIEW OF SYSTEMS:  Positives indicated with an "X"  CARDIOVASCULAR:  [ ]  chest pain   [ ]  chest pressure   [ ]  palpitations   [ ]  orthopnea   [x ] dyspnea on exertion   [ ]  claudication   [ ]  rest pain   [ ]  DVT   [ x] phlebitis PULMONARY:   [ ]  productive cough   [ ]  asthma   [ ]  wheezing NEUROLOGIC:   [ x] weakness  [ x] paresthesias  [ ]  aphasia  [ ]  amaurosis  [x ] dizziness HEMATOLOGIC:   [ ]  bleeding problems   [ ]  clotting  disorders MUSCULOSKELETAL:  [ ]  joint pain   [ ]  joint swelling GASTROINTESTINAL: [ ]   blood in stool  [ ]   hematemesis GENITOURINARY:  [ ]   dysuria  [ ]   hematuria PSYCHIATRIC:  [ ]  history of major depression INTEGUMENTARY:  [ ]  rashes  [ ]  ulcers CONSTITUTIONAL:  [ ]  fever   [ ]  chills  PHYSICAL EXAMINATION:  General: The patient is a well-nourished male, in no acute distress. Vital signs are BP 152/84  Pulse 64  Resp 20  Ht 5\' 10"  (1.778 m)  Wt 190 lb (86.183 kg)  BMI 27.26 kg/m2 Pulmonary: There is a good air exchange bilaterally without wheezing or rales. Abdomen: Soft and non-tender with no masses noted Musculoskeletal: There are no major deformities.  There is no significant extremity pain. Neurologic: No focal weakness or paresthesias are detected, Skin: There are no ulcer or rashes noted. Psychiatric: The patient has normal affect. Cardiovascular: There is a regular rate and rhythm without significant murmur appreciated. Pulse status: 2+ radial 2+ femoral and 2+ dorsalis pedis pulses bilaterally Carotid arteries without bruits bilaterally He does have scattered telangiectasia and small varicosities in both legs. His veins are very superficial and he does not have any swelling in his right or left leg today.  I reimaged his right saphenous vein with SonoSite today to screen. This shows mild dilatation and no evidence of reflux as was seen in his formal venous duplex study.  Impression and Plan:  Had a long discussion with the patient and his wife present. They were concerned regarding issues regarding lower surety flow and diabetes. He has a completely normal exam regarding his arterial system. Also explained the significance of his normal venous duplex showing no DVT and no evidence of deep or superficial valve competence. He has been wearing low-grade compression garments. I explained that this would be appropriate from a symptomatic relief standpoint that he is not having  significant swelling I would not feel that this would be critically necessary. He was reassured that this discussion will see [place] again on an as-needed basis    Jerald Villalona [facility] [place] Office: [phone]

## 2013-01-10 ENCOUNTER — Other Ambulatory Visit: Payer: Self-pay | Admitting: Internal Medicine

## 2013-01-17 ENCOUNTER — Ambulatory Visit (INDEPENDENT_AMBULATORY_CARE_PROVIDER_SITE_OTHER): Payer: Medicare Other | Admitting: Family Medicine

## 2013-01-17 ENCOUNTER — Encounter: Payer: Self-pay | Admitting: Family Medicine

## 2013-01-17 ENCOUNTER — Emergency Department (HOSPITAL_COMMUNITY)
Admission: EM | Admit: 2013-01-17 | Discharge: 2013-01-17 | Disposition: A | Discharge status: Home / Self Care | Payer: Medicare Other | Attending: Emergency Medicine | Admitting: Emergency Medicine

## 2013-01-17 ENCOUNTER — Encounter (HOSPITAL_COMMUNITY): Payer: Self-pay | Admitting: Emergency Medicine

## 2013-01-17 VITALS — BP 130/74 | HR 76 | Temp 98.1°F | Wt 186.0 lb

## 2013-01-17 DIAGNOSIS — Z7982 Long term (current) use of aspirin: Secondary | ICD-10-CM | POA: Insufficient documentation

## 2013-01-17 DIAGNOSIS — R358 Other polyuria: Secondary | ICD-10-CM | POA: Insufficient documentation

## 2013-01-17 DIAGNOSIS — B370 Candidal stomatitis: Secondary | ICD-10-CM

## 2013-01-17 DIAGNOSIS — E1165 Type 2 diabetes mellitus with hyperglycemia: Secondary | ICD-10-CM

## 2013-01-17 DIAGNOSIS — Z86718 Personal history of other venous thrombosis and embolism: Secondary | ICD-10-CM | POA: Insufficient documentation

## 2013-01-17 DIAGNOSIS — E1169 Type 2 diabetes mellitus with other specified complication: Secondary | ICD-10-CM | POA: Insufficient documentation

## 2013-01-17 DIAGNOSIS — R259 Unspecified abnormal involuntary movements: Secondary | ICD-10-CM | POA: Insufficient documentation

## 2013-01-17 DIAGNOSIS — Z8719 Personal history of other diseases of the digestive system: Secondary | ICD-10-CM | POA: Insufficient documentation

## 2013-01-17 DIAGNOSIS — M79609 Pain in unspecified limb: Secondary | ICD-10-CM | POA: Insufficient documentation

## 2013-01-17 DIAGNOSIS — R3589 Other polyuria: Secondary | ICD-10-CM | POA: Insufficient documentation

## 2013-01-17 DIAGNOSIS — Z8739 Personal history of other diseases of the musculoskeletal system and connective tissue: Secondary | ICD-10-CM | POA: Insufficient documentation

## 2013-01-17 DIAGNOSIS — R5381 Other malaise: Secondary | ICD-10-CM | POA: Insufficient documentation

## 2013-01-17 DIAGNOSIS — Z79899 Other long term (current) drug therapy: Secondary | ICD-10-CM | POA: Insufficient documentation

## 2013-01-17 DIAGNOSIS — R631 Polydipsia: Secondary | ICD-10-CM | POA: Insufficient documentation

## 2013-01-17 DIAGNOSIS — M1990 Unspecified osteoarthritis, unspecified site: Secondary | ICD-10-CM | POA: Insufficient documentation

## 2013-01-17 DIAGNOSIS — E119 Type 2 diabetes mellitus without complications: Secondary | ICD-10-CM

## 2013-01-17 DIAGNOSIS — Z8701 Personal history of pneumonia (recurrent): Secondary | ICD-10-CM | POA: Insufficient documentation

## 2013-01-17 DIAGNOSIS — I10 Essential (primary) hypertension: Secondary | ICD-10-CM | POA: Insufficient documentation

## 2013-01-17 DIAGNOSIS — K219 Gastro-esophageal reflux disease without esophagitis: Secondary | ICD-10-CM | POA: Insufficient documentation

## 2013-01-17 DIAGNOSIS — E785 Hyperlipidemia, unspecified: Secondary | ICD-10-CM | POA: Insufficient documentation

## 2013-01-17 DIAGNOSIS — R42 Dizziness and giddiness: Secondary | ICD-10-CM | POA: Insufficient documentation

## 2013-01-17 DIAGNOSIS — R739 Hyperglycemia, unspecified: Secondary | ICD-10-CM

## 2013-01-17 LAB — BLOOD GAS, VENOUS
Acid-Base Excess: 3.9 mmol/L — ABNORMAL HIGH (ref 0.0–2.0)
O2 Saturation: 78.4 %
Patient temperature: 98.6
TCO2: 26.3 mmol/L (ref 0–100)

## 2013-01-17 LAB — CBC WITH DIFFERENTIAL/PLATELET
Basophils Absolute: 0 10*3/uL (ref 0.0–0.1)
Basophils Relative: 0 % (ref 0–1)
Eosinophils Absolute: 0.1 10*3/uL (ref 0.0–0.7)
MCH: 29.6 pg (ref 26.0–34.0)
MCHC: 33.7 g/dL (ref 30.0–36.0)
Monocytes Absolute: 0.6 10*3/uL (ref 0.1–1.0)
Monocytes Relative: 11 % (ref 3–12)
Neutro Abs: 3.5 10*3/uL (ref 1.7–7.7)
Neutrophils Relative %: 60 % (ref 43–77)
RDW: 12.5 % (ref 11.5–15.5)

## 2013-01-17 LAB — URINALYSIS, ROUTINE W REFLEX MICROSCOPIC
Bilirubin Urine: NEGATIVE
Ketones, ur: 15 mg/dL — AB
Leukocytes, UA: NEGATIVE
Nitrite: NEGATIVE
Urobilinogen, UA: 0.2 mg/dL (ref 0.0–1.0)
pH: 6 (ref 5.0–8.0)

## 2013-01-17 LAB — BASIC METABOLIC PANEL
BUN: 22 mg/dL (ref 6–23)
Chloride: 88 mEq/L — ABNORMAL LOW (ref 96–112)
Creatinine, Ser: 0.72 mg/dL (ref 0.50–1.35)
GFR calc Af Amer: >90 mL/min (ref >90)
GFR calc non Af Amer: 88 mL/min — ABNORMAL LOW (ref >90)
Potassium: 4.5 mEq/L (ref 3.5–5.1)

## 2013-01-17 LAB — POCT URINALYSIS DIPSTICK
Nitrite, UA: NEGATIVE
Urobilinogen, UA: 0.2

## 2013-01-17 LAB — GLUCOSE, CAPILLARY
Glucose-Capillary: >600 mg/dL (ref 70–99)
Glucose-Capillary: 312 mg/dL — ABNORMAL HIGH (ref 70–99)
Glucose-Capillary: 225 mg/dL — ABNORMAL HIGH (ref 70–99)

## 2013-01-17 LAB — POCT CBG (FASTING - GLUCOSE)-MANUAL ENTRY: Glucose Fasting, POC: 513 mg/dL — AB (ref 70–99)

## 2013-01-17 MED ORDER — SODIUM CHLORIDE 0.9 % IV SOLN
INTRAVENOUS | Status: DC
  Administered 2013-01-17: 3.3 [IU]/h via INTRAVENOUS
  Filled 2013-01-17: qty 1

## 2013-01-17 MED ORDER — GLIPIZIDE 2.5 MG HALF TABLET: 2.5000 mg | ORAL_TABLET | Freq: Every day | ORAL | Status: DC

## 2013-01-17 MED ORDER — ONETOUCH ULTRASOFT LANCETS MISC: Status: DC

## 2013-01-17 MED ORDER — NYSTATIN 100000 UNIT/ML MT SUSP: 500000.0000 [IU] | Freq: Four times a day (QID) | OROMUCOSAL | Status: DC

## 2013-01-17 MED ORDER — SODIUM CHLORIDE 0.9 % IV BOLUS (SEPSIS)
1000.0000 mL | Freq: Once | INTRAVENOUS | Status: AC
  Administered 2013-01-17: 1000 mL via INTRAVENOUS

## 2013-01-17 MED ORDER — METFORMIN HCL 500 MG PO TABS: 500.0000 mg | ORAL_TABLET | Freq: Two times a day (BID) | ORAL | Status: DC

## 2013-01-17 MED ORDER — GLUCOSE BLOOD VI STRP: ORAL_STRIP | Status: DC

## 2013-01-17 NOTE — Progress Notes (Signed)
Chief Complaint  Patient presents with  . Oral Pain    roof of mouth is white with little ridges    HPI:  Acute visit for white coating on month: -started about 4 days ago -burns a little and feels ike a bad taste in mouth -also has felt tired for a long time and has felt thirsty with mouth dry - feels like BS up for some time, weak, polydipsia, polyuria -he is worried about his diabetes and has no way of checking it and does not have appt with PCP for a few weeks so wanted to check diabetes today -trying to lose weight with watching what he eats -no new medication -uses nasal spray daily for nasal symptoms - doesn't know what it is -denies: cough, fever, abdominal pain, SOB, CP  ROS: See pertinent positives and negatives per HPI.  Past Medical History  Diagnosis Date  . Diabetes mellitus     type 2  . Diverticulosis of colon   . GERD (gastroesophageal reflux disease)   . Osteoarthritis   . Pneumonia 2002    history of  . Stress incontinence, male     urge  . Dyslipidemia   . Hernia, hiatal   . Hyperlipidemia   . Hypertension   . DVT (deep venous thrombosis)     Family History  Problem Relation Age of Onset  . Other Mother 57    CABG  . Heart disease Mother   . Hypertension Mother   . Hyperlipidemia Son     History   Social History  . Marital Status: Married    Spouse Name: N/A    Number of Children: N/A  . Years of Education: N/A   Social History Main Topics  . Smoking status: Never Smoker   . Smokeless tobacco: Never Used  . Alcohol Use: No  . Drug Use: No  . Sexually Active: None   Other Topics Concern  . None   Social History Narrative  . None    Current outpatient prescriptions:acetaminophen (TYLENOL ARTHRITIS PAIN) 650 MG CR tablet, Take 650 mg by mouth every 8 (eight) hours as needed., Disp: , Rfl: ;  amLODipine (NORVASC) 5 MG tablet, TAKE ONE TABLET BY MOUTH DAILY, Disp: 90 tablet, Rfl: 4;  aspirin 81 MG tablet, Take 81 mg by mouth daily.   , Disp: , Rfl: ;  calcium-vitamin D (OSCAL WITH D) 500-200 MG-UNIT per tablet, Take 1 tablet by mouth daily., Disp: , Rfl:  cyanocobalamin 100 MCG tablet, Take 100 mcg by mouth daily., Disp: , Rfl: ;  doxazosin (CARDURA) 4 MG tablet, Take 1 tablet (4 mg total) by mouth daily., Disp: 90 tablet, Rfl: 1;  metFORMIN (GLUCOPHAGE) 500 MG tablet, Take 1 tablet (500 mg total) by mouth 2 (two) times daily with a meal., Disp: 90 tablet, Rfl: 3;  Multiple Vitamins-Minerals (CENTRUM SILVER ADULT 50+ PO), Take by mouth daily., Disp: , Rfl:  multivitamin (THERAGRAN) per tablet, Take 1 tablet by mouth daily.  , Disp: , Rfl: ;  naproxen sodium (ANAPROX) 220 MG tablet, Take 220 mg by mouth 2 (two) times daily with a meal., Disp: , Rfl: ;  omeprazole (PRILOSEC) 20 MG capsule, TAKE ONE CAPSULE BY MOUTH DAILY, Disp: 90 capsule, Rfl: 1;  simvastatin (ZOCOR) 20 MG tablet, TAKE ONE TABLET BY MOUTH DAILY, Disp: 90 tablet, Rfl: 2 glucose blood (ONE TOUCH ULTRA TEST) test strip, Use as instructed, Disp: 100 each, Rfl: 12;  Lancets (ONETOUCH ULTRASOFT) lancets, Use as instructed, Disp: 100 each,  Rfl: 12;  nystatin (MYCOSTATIN) 100000 UNIT/ML suspension, Take 5 mLs (500,000 Units total) by mouth 4 (four) times daily., Disp: 240 mL, Rfl: 0  EXAM:  Filed Vitals:   01/17/13 1444  BP: 130/74  Pulse: 76  Temp: 98.1 F (36.7 C)    There is no height on file to calculate BMI.  GENERAL: vitals reviewed and listed above, alert, oriented, appears well hydrated and in no acute distress  HEENT: atraumatic, conjunttiva clear, no obvious abnormalities on inspection of external nose and ears, mouth with white coating on tongue  NECK: no obvious masses on inspection  LUNGS: clear to auscultation bilaterally, no wheezes, rales or rhonchi, good air movement  CV: HRRR, no peripheral edema  MS: moves all extremities without noticeable abnormality  PSYCH: pleasant and cooperative, no obvious depression or anxiety  ASSESSMENT AND  PLAN:  Discussed the following assessment and plan:  1. Oral thrush  nystatin (MYCOSTATIN) 100000 UNIT/ML suspension  2. Uncontrolled diabetes mellitus    3. DIABETES MELLITUS, TYPE II  metFORMIN (GLUCOPHAGE) 500 MG tablet, Basic metabolic panel, Hemoglobin A1c, Ambulatory referral to Endocrinology, glucose blood (ONE TOUCH ULTRA TEST) test strip, Lancets (ONETOUCH ULTRASOFT) lancets, Basic metabolic panel, Hemoglobin A1c   -Blood sugar 430 here to day and higher on repeat, gave 5 units novolog and 3 cups of water, but sugar on repeat check 40 minutes even higher 518 and patient and wife feel unwell -Udip with Glu, ketones, blood -advised should go to ED for acute management  -monitor given for pt to monitor Blood Sugar at home - referral to Dr. Lafe Garin placed and appt info given -gave him monitor and instructed on how to use it -advised increased metformin until sees endo and lifestyle changes  -oral thrush tx per above -hgbA1c and BMP today, urine dip -Patient advised to return or notify a doctor immediately if symptoms worsen or persist or new concerns arise.  Patient Instructions  -stop the nasal spray - unless it is just nasal saline (nasal saline is ok)  -drink plenty of water  -use the mouth wash for the burning in your mouth according to instructions  -increase your metformin to 500mg  twice daily  CHECK YOUR BLOOD SUGAR: 1) Every morning (fasting) 2)2 hours after 1 meal daily (postprandial) Keep a blood sugar log in the book provided and take this to all appointments  Please eat 3-4 small healthy meals per day and avoid sugar, sweets, white rice, bread, potatoes and other starchy foods  Please see the endocrinologist next week on Tuesday per appointment details provided       Kriste Basque R.

## 2013-01-17 NOTE — ED Notes (Signed)
Pt sent by [facility] for hyperglycemia, white mouth lesions and fatigue.  Has been "having problems" for about 2 weeks per wife.  CBG at [facility] was >500 where he was given IVF's and novalog.   RN states on recheck CBG was higher than before treatments.

## 2013-01-17 NOTE — ED Notes (Signed)
MD at bedside. 

## 2013-01-17 NOTE — ED Provider Notes (Signed)
History    CSN: [id number] Arrival date & time 01/17/13  1646  First MD Initiated Contact with Patient 01/17/13 1801      Chief Complaint  Patient presents with  . hyperglycemic   . Fatigue    HPI Patient presents to emergency room with complaints of hyperglycemia and fatigue. The patient has history of diabetes. He was started on metformin back in August. The patient states that he has not really had any repeat blood sugar testing since he started that medication.  Over the last several months, he has felt weak and fatigued. He has had polydipsia and polyuria. He does get lightheaded when he stands. Patient went saw his primary doctor today. He was noted to have thrush. His blood sugar was checked in the office and was in the 500s. In the office he was given IV fluids as well as NovoLog. His blood sugar remained elevated . The patient was sent to the emergency room for further evaluation.  He occasionally has some sharp-type pains in the bottom of his feet and lower extremities. Otherwise he denies any other complaints of pain or other acute issues today. Past Medical History  Diagnosis Date  . Diabetes mellitus     type 2  . Diverticulosis of colon   . GERD (gastroesophageal reflux disease)   . Osteoarthritis   . Pneumonia 2002    history of  . Stress incontinence, male     urge  . Dyslipidemia   . Hernia, hiatal   . Hyperlipidemia   . Hypertension   . DVT (deep venous thrombosis)     Past Surgical History  Procedure Date  . Wrist tendon surgery   . Cts bilateral   . Knee arthroscopy 1988, 1992    bilateral  . Total knee arthroplasty 2005, 2007  . Lumbar left arm 1961    Family History  Problem Relation Age of Onset  . Other Mother 72    CABG  . Heart disease Mother   . Hypertension Mother   . Hyperlipidemia Son     History  Substance Use Topics  . Smoking status: Never Smoker   . Smokeless tobacco: Never Used  . Alcohol Use: No      Review of Systems  All  other systems reviewed and are negative.    Allergies  Ciprofloxacin and Sulfamethoxazole  Home Medications   Current Outpatient Rx  Name  Route  Sig  Dispense  Refill  . ACETAMINOPHEN ER 650 MG PO TBCR   Oral   Take 650 mg by mouth every 8 (eight) hours as needed.         Marland Kitchen AMLODIPINE BESYLATE 5 MG PO TABS   Oral   Take 5 mg by mouth daily.         . ASPIRIN 81 MG PO TABS   Oral   Take 81 mg by mouth daily.           . CYANOCOBALAMIN 100 MCG PO TABS   Oral   Take 100 mcg by mouth daily.         Marland Kitchen DOXAZOSIN MESYLATE 4 MG PO TABS   Oral   Take 4 mg by mouth daily.         Marland Kitchen GLUCOSE BLOOD VI STRP   Other   1 each by Other route 3 (three) times daily as needed. Use as instructed         . METFORMIN HCL 500 MG PO TABS   Oral  Take 500 mg by mouth 2 (two) times daily with a meal.         . MULTIVITAMINS PO TABS   Oral   Take 1 tablet by mouth daily.           Marland Kitchen NAPROXEN SODIUM 220 MG PO TABS   Oral   Take 220 mg by mouth 2 (two) times daily with a meal.         . NYSTATIN 100000 UNIT/ML MT SUSP   Oral   Take 500,000 Units by mouth 4 (four) times daily.         Marland Kitchen OMEPRAZOLE 20 MG PO CPDR   Oral   Take 20 mg by mouth daily.         Marland Kitchen SIMVASTATIN 20 MG PO TABS   Oral   Take 20 mg by mouth at bedtime.           BP 131/69  Pulse 64  Temp 98 F (36.7 C)  Resp 18  SpO2 96%  Physical Exam  Nursing note and vitals reviewed. Constitutional: He appears well-developed and well-nourished. No distress.  HENT:  Head: Normocephalic and atraumatic.  Right Ear: External ear normal.  Left Ear: External ear normal.  Mouth/Throat: Oropharyngeal exudate: white lesions on the tongue and oral mucosa.  Eyes: Conjunctivae normal are normal. Right eye exhibits no discharge. Left eye exhibits no discharge. No scleral icterus.  Neck: Neck supple. No tracheal deviation present.  Cardiovascular: Normal rate, regular rhythm and intact distal pulses.    Pulmonary/Chest: Effort normal and breath sounds normal. No stridor. No respiratory distress. He has no wheezes. He has no rales.  Abdominal: Soft. Bowel sounds are normal. He exhibits no distension. There is no tenderness. There is no rebound and no guarding.  Musculoskeletal: He exhibits no edema and no tenderness.  Neurological: He is alert. He has normal strength. No sensory deficit. Cranial nerve deficit:  no gross defecits noted. He exhibits normal muscle tone. He displays no seizure activity. Coordination normal.  Skin: Skin is warm and dry. No rash noted.  Psychiatric: He has a normal mood and affect.    ED Course  Procedures (including critical care time)  Labs Reviewed  GLUCOSE, CAPILLARY - Abnormal; Notable for the following:    Glucose-Capillary >600 (*)     All other components within normal limits  CBC WITH DIFFERENTIAL - Abnormal; Notable for the following:    Hemoglobin 12.5 (*)     HCT 37.1 (*)     All other components within normal limits  BASIC METABOLIC PANEL - Abnormal; Notable for the following:    Sodium 128 (*)     Chloride 88 (*)     Glucose, Bld 419 (*)     GFR calc non Af Amer 88 (*)     All other components within normal limits  URINALYSIS, ROUTINE W REFLEX MICROSCOPIC - Abnormal; Notable for the following:    Specific Gravity, Urine 1.037 (*)     Glucose, UA >1000 (*)     Hgb urine dipstick TRACE (*)     Ketones, ur 15 (*)     All other components within normal limits  BLOOD GAS, VENOUS - Abnormal; Notable for the following:    pH, Ven 7.394 (*)     Bicarbonate 29.1 (*)     Acid-Base Excess 3.9 (*)     All other components within normal limits  GLUCOSE, CAPILLARY - Abnormal; Notable for the following:    Glucose-Capillary 392 (*)  All other components within normal limits  URINE MICROSCOPIC-ADD ON - Abnormal; Notable for the following:    Squamous Epithelial / LPF FEW (*)     Bacteria, UA FEW (*)     All other components within normal limits   GLUCOSE, CAPILLARY - Abnormal; Notable for the following:    Glucose-Capillary 312 (*)     All other components within normal limits   No results found.   1. Diabetes mellitus   2. Hyperglycemia       MDM  Pt's hyperglycemia improved with treatment.  Will dc home.  No DKA.  No dehydration.  Discussed with Dr Nedra Hai regarding adivce for additional diabetes medications.  Rec additional 2.5 glucotrol.  May be able to dc as his metformin is increased.  Pt to follow up with PCP        Celene Kras, MD 01/17/13 2134

## 2013-01-17 NOTE — Patient Instructions (Addendum)
-  stop the nasal spray - unless it is just nasal saline (nasal saline is ok)  -drink plenty of water  -use the mouth wash for the burning in your mouth according to instructions  -increase your metformin to 500mg  twice daily  CHECK YOUR BLOOD SUGAR: 1) Every morning (fasting) 2)2 hours after 1 meal daily (postprandial) Keep a blood sugar log in the book provided and take this to all appointments  Please eat 3-4 small healthy meals per day and avoid sugar, sweets, white rice, bread, potatoes and other starchy foods  Please see the endocrinologist next week on Tuesday per appointment details provided

## 2013-01-17 NOTE — ED Notes (Signed)
Pt with DM2 coming PCP with CBG of 518 and Pt received Insulin shot, unknown amount from dr office. Pt has had not felt well for several days and repots very dry mouth. Pt says he is tired with dizziness and shaking. Pt NAD at this time.

## 2013-01-18 ENCOUNTER — Telehealth (HOSPITAL_COMMUNITY): Payer: Self-pay | Admitting: Emergency Medicine

## 2013-01-18 ENCOUNTER — Telehealth: Payer: Self-pay | Admitting: Family Medicine

## 2013-01-18 LAB — BASIC METABOLIC PANEL
Calcium: 10.7 mg/dL — ABNORMAL HIGH (ref 8.4–10.5)
Creat: 0.95 mg/dL (ref 0.50–1.35)

## 2013-01-18 LAB — HEMOGLOBIN A1C: Mean Plasma Glucose: 375 mg/dL — ABNORMAL HIGH (ref <117)

## 2013-01-18 NOTE — Telephone Encounter (Signed)
Informed pt about his high sugar last night- he is aware (at the pharmacy now)- was in there ER -got insulin and fluids and sugar was at last check 212 At pharmacy-needed clarification of lancets/strips and also directions That was clarified appt with endocrine on tues  Feeling better Will call if sugars spike again - and keep [place] updated in the meantime

## 2013-01-20 ENCOUNTER — Other Ambulatory Visit: Payer: Self-pay

## 2013-01-21 ENCOUNTER — Ambulatory Visit (INDEPENDENT_AMBULATORY_CARE_PROVIDER_SITE_OTHER): Payer: Medicare Other | Admitting: Internal Medicine

## 2013-01-21 ENCOUNTER — Encounter: Payer: Self-pay | Admitting: Internal Medicine

## 2013-01-21 VITALS — BP 120/62 | HR 76 | Temp 98.4°F | Resp 16 | Ht 69.0 in | Wt 188.0 lb

## 2013-01-21 DIAGNOSIS — E119 Type 2 diabetes mellitus without complications: Secondary | ICD-10-CM

## 2013-01-21 MED ORDER — METFORMIN HCL 1000 MG PO TABS: 1000.0000 mg | ORAL_TABLET | Freq: Two times a day (BID) | ORAL | Status: DC

## 2013-01-21 MED ORDER — SITAGLIPTIN PHOSPHATE 100 MG PO TABS: 100.0000 mg | ORAL_TABLET | Freq: Every day | ORAL | Status: DC

## 2013-01-21 NOTE — Patient Instructions (Addendum)
Please return in 1 month with your sugar log. Please increase the Metformin to 1000 mg twice a day. Please start Januvia at 50 mg daily for 5 days, then advance to 100 mg daily.  Patient instructions for Diabetes mellitus type 2:  DIET AND EXERCISE Diet and exercise is an important part of diabetic treatment.  We recommended aerobic exercise in the form of brisk walking (working between 40-60% of maximal aerobic capacity, similar to brisk walking) for 150 minutes per week (such as 30 minutes five days per week) along with 3 times per week performing 'resistance' training (using various gauge rubber tubes with handles) 5-10 exercises involving the major muscle groups (upper body, lower body and core) performing 10-15 repetitions (or near fatigue) each exercise. Start at half the above goal but build slowly to reach the above goals. If limited by weight, joint pain, or disability, we recommend daily walking in a swimming pool with water up to waist to reduce pressure from joints while allow for adequate exercise.    BLOOD GLUCOSES Monitoring your blood glucoses is important for continued management of your diabetes. Please check your blood glucoses 3-4 times a day: fasting, before meals and at bedtime (you can rotate these measurements - e.g. one day check before the 3 meals, the next day check before 2 of the meals and before bedtime, etc.   HYPOGLYCEMIA (low blood sugar) Hypoglycemia is usually a reaction to not eating, exercising, or taking too much insulin/ other diabetes drugs.  Symptoms include tremors, sweating, hunger, confusion, headache, etc. Treat IMMEDIATELY with 15 grams of Carbs:   4 glucose tablets    cup regular juice/soda   2 tablespoons raisins   4 teaspoons sugar   1 tablespoon honey Recheck blood glucose in 15 mins and repeat above if still symptomatic/blood glucose <100. Please contact our office at [phone] if you have questions about how to next handle your  insulin.  RECOMMENDATIONS TO REDUCE YOUR RISK OF DIABETIC COMPLICATIONS: * Take your prescribed MEDICATION(S). * Follow a DIABETIC diet: Complex carbs, fiber rich foods, heart healthy fish twice weekly, (monounsaturated and polyunsaturated) fats * AVOID saturated/trans fats, high fat foods, >2,300 mg salt per day. * EXERCISE at least 5 times a week for 30 minutes or preferably daily.  * DO NOT SMOKE OR DRINK more than 1 drink a day. * Check your FEET every day. Do not wear tightfitting shoes. Contact us if you develop an ulcer * See your EYE doctor once a year or more if needed * Get a FLU shot once a year * Get a PNEUMONIA vaccine every 5 years and once after age 24 years  GOALS:  * Your Hemoglobin A1c of 7%  * Your Systolic BP should be 130 or lower  * Your Diastolic BP should be 80 or lower  * Your HDL (Good Cholesterol) should be 40 or higher  * Your LDL (Bad Cholesterol) should be 100 or lower  * Your Triglycerides should be 150 or lower  * Your Urine microalbumin (kidney function) of <30 * Your Body Mass Index should be 25 or lower  We will be glad to help you achieve these goals. Our telephone number is: [phone].  Healthy food substitutions: - substitute whole grain for white bread or pasta - substitute brown rice for white rice - substitute 90-calorie flat bread pieces for slices of bread when possible - substitute sweet potatoes or yams for white potatoes - substitute humus for margarine - substitute tofu for cheese  when possible - substitute almond or rice milk for regular milk (would not drink soy milk daily due to concern for soy estrogen influence on breast cancer risk) - substitute dark chocolate for other sweets when possible - substitute water - can add lemon or orange slices for taste - for diet sodas (artificial sweeteners will trick your body that you can eat sweets without getting calories and will lead you to overeating and weight gain in the long run) - do  not skip breakfast or other meals (this will slow down the metabolism and will result in more weight gain over time)  - can try smoothies made from fruit and almond/rice milk in am instead of regular breakfast - can also try old-fashioned (not instant) oatmeal made with almond/rice milk in am - order the dressing on the side when eating salad at a restaurant - eat as little meat as possible - can try juicing, but should not forget that juicing will get rid of the fiber, so would alternate with eating raw veg./fruits or drinking smoothies - use as little oil as possible, even when using olive oil - can dress a salad with a mix of balsamic vinegar and lemon juice, for e.g. - use agave nectar, stevia sugar, or regular sugar rather than artificial sweateners - steam or broil/roast veggies  - snack on veggies/fruit/nuts (unsalted, preferably) when possible, rather than processed foods - reduce or eliminate aspartame in diet (it is in diet sodas, chewing gum, etc) Read the labels!   Jennette Kettle Barnard's book: "Program for Reversing Diabetes" for the vegan concept and other ideas for healthy eating.

## 2013-01-21 NOTE — Progress Notes (Signed)
Subjective:     Patient ID: [name], male   DOB: 1933/03/29, 79 y.o.   MRN: [id number]  HPI Mr. Vuncannon is a very pleasant 79 year old man referred by Dr. Selena Batten for management of diabetes mellitus type 2, uncontrolled, non-insulin-dependent, with complications (peripheral neuropathy). His PCP is Dr. Cato Mulligan. Pt is here with his wife.  He was dx with DM2 in Aug 2013. He was started on Metformin 500 mg daily that he could tolerate well. His hemoglobin A1c levels were maintained at 7-8%, however, in the last month, he started feeling poorly: it all started with a swollen R ankle and leg (venous doppler nl). He also developped severe fatigue, increased thirst and urination, N/V, blurry vision. He came back to be seen by Dr. Selena Batten, and a blood sugar in the office returned at 430. He was given 5 units of NovoLog and water but his sugar rechecked 40 minute later, was still elevated, at 518. He was sent to the ED but also his metformin was increased to 500 mg twice a day. He was referred to endocrinology.  Of note, a hemoglobin A1c checked in the hospital was: Lab Results  Component Value Date   HGBA1C 14.7* 01/17/2013  He is now on a regimen of: - Metformin 500 mg bid - Glipizide 2.5 mg qd started 4 days ago in the emergency room.  The patient did not have a meter before this episode, so he did not check his sugars. He was very surprised about how high his CBG was in Dr. Elmyra Ricks office. He cannot trace his persistent hyperglycemia to any potential culprits. He did not have dietary indiscretions more than before, no steroids and no other change in his medicines. Since this episode, he and his wife started to implement drastic dietary changes, eliminating most of the high caloric foods, and counting calories as much as they can. They now use a lot of fruits and vegetables, too. He now has a meter, and blood sugars checked after his ED visit are high, in the high 200s and 300s, however they are decreasing to low  200s.  He has peripheral neuropathy, last eye exam 11/2012: no DR (Dr. London Sheer). No kidney dysfunction.   Meals: - breakfast: poached egg, sausage patty, whole grain toast, fruit - Lunch: Steamed vegetables, meats, fruits - dinner: tuna wrap, fruit - snacks: 2-3 a day  Review of Systems Most of the below spx are improving: Constitutional: + weight /loss, + fatigue, + subjective hyperthermia, decreased appetite due to thrush, poor sleep, increased urination-improving Eyes: + blurry vision - improving, no xerophthalmia ENT: no sore throat, no thrush, no nodules palpated in throat, no dysphagia/odynophagia, + hoarseness Cardiovascular: no CP/SOB/palpitations/leg swelling Respiratory: no cough/SOB Gastrointestinal: no N/had V/D/C Musculoskeletal: no muscle/joint aches Skin: no rashes Neurological: + tremors (shakiness) /numbness/tingling/dizziness Psychiatric: no depression/anxiety     Objective:   Physical Exam BP 120/62  Pulse 76  Temp(Src) 98.4 F (36.9 C) (Oral)  Resp 16  Ht 5\' 9"  (1.753 m)  Wt 188 lb (85.276 kg)  BMI 27.75 kg/m2  SpO2 95% Wt Readings from Last 3 Encounters:  01/21/13 188 lb (85.276 kg)  01/17/13 186 lb (84.369 kg)  01/09/13 190 lb (86.183 kg)  Constitutional: overweight, in NAD Eyes: PERRLA, EOMI, no exophthalmos ENT: moist mucous membranes, no thyromegaly, no cervical lymphadenopathy Cardiovascular: RRR, No MRG Respiratory: CTA B Gastrointestinal: abdomen soft, NT, ND, BS+ Musculoskeletal: no deformities, strength intact in all 4 Skin: moist, warm, no rashes Neurological: no tremor  with outstretched hands, DTR normal in all 4  Assessment:     1. DM2, noninsulin-dependent, uncontrolled, with complications - peripheral neuropathy  Plan:     1. Diabetes mellitus type 2, recently uncontrolled - It is unclear why the patient's diabetes became uncontrolled in the last few months, culminating with sugars >500, and with patient feeling very  poorly. The fact that he did not check sugars before exacerbated the problem. - The question at the moment is whether we need to start insulin, or we can manage his high sugars with oral medicines. His sugars have already started to decrease after increasing metformin to 500 mg po bid, and the patient feels much better. He also started institute very good dietary changes. I feel that, at this point, we can maximize his oral medications and probably do well without insulin. Therefore, I advised the patient to increase his metformin 1000 mg twice a day, and add Januvia 100 mg daily. - Advised him to call me with high sugars, and we might need to change the regimen depending on the recorded sugars - given sugar log and advised how to fill it and to bring it at next appt - given foot care handout and explained the principles - given instructions for hypoglycemia management "15-15 rule" - we had a long discussion about healthy foods and I gave them some more suggestions (please see patient instruction section) - I also offered to refer them to diabetes education and I placed this referral

## 2013-01-30 ENCOUNTER — Encounter: Payer: Medicare Other | Admitting: Vascular Surgery

## 2013-02-03 ENCOUNTER — Other Ambulatory Visit (INDEPENDENT_AMBULATORY_CARE_PROVIDER_SITE_OTHER): Payer: Medicare Other

## 2013-02-03 DIAGNOSIS — E1110 Type 2 diabetes mellitus with ketoacidosis without coma: Secondary | ICD-10-CM

## 2013-02-03 DIAGNOSIS — I10 Essential (primary) hypertension: Secondary | ICD-10-CM

## 2013-02-03 LAB — HEPATIC FUNCTION PANEL
ALT: 32 U/L (ref 0–53)
AST: 36 U/L (ref 0–37)
Albumin: 4.3 g/dL (ref 3.5–5.2)
Alkaline Phosphatase: 41 U/L (ref 39–117)

## 2013-02-03 LAB — LIPID PANEL
Cholesterol: 124 mg/dL (ref 0–200)
HDL: 46.1 mg/dL (ref >39.00)
Total CHOL/HDL Ratio: 3
Triglycerides: 50 mg/dL (ref 0.0–149.0)

## 2013-02-03 LAB — BASIC METABOLIC PANEL
Calcium: 9.9 mg/dL (ref 8.4–10.5)
GFR: 102.49 mL/min (ref >60.00)
Glucose, Bld: 155 mg/dL — ABNORMAL HIGH (ref 70–99)
Potassium: 4.7 mEq/L (ref 3.5–5.1)
Sodium: 136 mEq/L (ref 135–145)

## 2013-02-03 LAB — HEMOGLOBIN A1C: Hgb A1c MFr Bld: 13.4 % — ABNORMAL HIGH (ref 4.6–6.5)

## 2013-02-10 ENCOUNTER — Ambulatory Visit (INDEPENDENT_AMBULATORY_CARE_PROVIDER_SITE_OTHER): Payer: Medicare Other | Admitting: Internal Medicine

## 2013-02-10 ENCOUNTER — Encounter: Payer: Self-pay | Admitting: Internal Medicine

## 2013-02-10 VITALS — BP 120/64 | HR 64 | Temp 98.3°F | Wt 182.0 lb

## 2013-02-10 NOTE — Assessment & Plan Note (Signed)
Lipid Panel     Component Value Date/Time   CHOL 124 02/03/2013 0838   TRIG 50.0 02/03/2013 0838   HDL 46.10 02/03/2013 0838   CHOLHDL 3 02/03/2013 0838   VLDL 10.0 02/03/2013 0838   LDLCALC 68 02/03/2013 0838   Adequate control. Continue current medications.

## 2013-02-10 NOTE — Assessment & Plan Note (Signed)
Adequate control. Continue current medications. 

## 2013-02-10 NOTE — Assessment & Plan Note (Signed)
He understands the need to inspect his feet daily.

## 2013-02-10 NOTE — Assessment & Plan Note (Addendum)
Lab Results  Component Value Date   HGBA1C 13.4* 02/03/2013   Terrible control recently but much better with new medication regimen and new diet.  He is feeling much better and has f/u with endocrinology in two weeks.

## 2013-02-10 NOTE — Addendum Note (Signed)
Addended by: Lindley Magnus on: 02/10/2013 09:12 AM   Modules accepted: Orders

## 2013-02-10 NOTE — Assessment & Plan Note (Signed)
Pt has sxs of claudication/pseudoclaudication

## 2013-02-10 NOTE — Progress Notes (Signed)
DM-- pt had blood work that prompted a referral to endocrinology-- meds have been changed drastically and cbgs now are all under 150. Polyuria and polydipsia.  He is following a better diet. Following a strict DM diet.  htn-- no home bps  GERD-- tolerating meds without difficulty  Past Medical History  Diagnosis Date  . Diabetes mellitus     type 2  . Diverticulosis of colon   . GERD (gastroesophageal reflux disease)   . Osteoarthritis   . Pneumonia 2002    history of  . Stress incontinence, male     urge  . Dyslipidemia   . Hernia, hiatal   . Hyperlipidemia   . Hypertension   . DVT (deep venous thrombosis)     History   Social History  . Marital Status: Married    Spouse Name: N/A    Number of Children: N/A  . Years of Education: N/A   Occupational History  . Not on file.   Social History Main Topics  . Smoking status: Never Smoker   . Smokeless tobacco: Never Used  . Alcohol Use: No  . Drug Use: No  . Sexually Active: Not on file   Other Topics Concern  . 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 Wrist tendon surgery    . Cts bilateral    . Knee arthroscopy  1988, 1992    bilateral  . Total knee arthroplasty  2005, 2007  . Lumbar left arm  1961    Family History  Problem Relation Age of Onset  . Other Mother 70    CABG  . Heart disease Mother   . Hypertension Mother   . Hyperlipidemia Son     Allergies  Allergen Reactions  . Ciprofloxacin     REACTION: unspecified  . Sulfamethoxazole     REACTION: rash    Current Outpatient Prescriptions on File Prior to Visit  Medication Sig Dispense Refill  . acetaminophen (TYLENOL ARTHRITIS PAIN) 650 MG CR tablet Take 650 mg by mouth every 8 (eight) hours as needed.      Marland Kitchen amLODipine (NORVASC) 5 MG tablet Take 5 mg by mouth daily.      Marland Kitchen  aspirin 81 MG tablet Take 81 mg by mouth daily.        . cyanocobalamin 100 MCG tablet Take 100 mcg by mouth daily.      Marland Kitchen doxazosin (CARDURA) 4 MG tablet Take 4 mg by mouth daily.      Marland Kitchen glipiZIDE (GLUCOTROL) 2.5 mg TABS Take 0.5 tablets (2.5 mg total) by mouth daily.  7 tablet  0  . glucose blood test strip 1 each by Other route 3 (three) times daily as needed. Use as instructed      . metFORMIN (GLUCOPHAGE) 1000 MG tablet Take 1 tablet (1,000 mg total) by mouth 2 (two) times daily with a meal.  180 tablet  3  . multivitamin (THERAGRAN) per tablet Take 1 tablet by mouth daily.        . naproxen sodium (ANAPROX) 220 MG tablet Take 220 mg by mouth 2 (two) times daily with a meal.      . nystatin (MYCOSTATIN) 100000 UNIT/ML suspension Take 500,000 Units by mouth 4 (four) times daily.      Marland Kitchen omeprazole (PRILOSEC)  20 MG capsule Take 20 mg by mouth daily.      . simvastatin (ZOCOR) 20 MG tablet Take 20 mg by mouth at bedtime.      . sitaGLIPtin (JANUVIA) 100 MG tablet Take 1 tablet (100 mg total) by mouth daily.  90 tablet  1   No current facility-administered medications on file prior to visit.     patient denies chest pain, shortness of breath, orthopnea. Denies lower extremity edema, abdominal pain, change in appetite, change in bowel movements. Patient denies rashes, musculoskeletal complaints. No other specific complaints in a complete review of systems.   There were no vitals taken for this visit.  well-developed well-nourished male in no acute distress. HEENT exam atraumatic, normocephalic, neck supple without jugular venous distention. Chest clear to auscultation cardiac exam S1-S2 are regular. Abdominal exam overweight with bowel sounds, soft and nontender. Extremities no edema. Neurologic exam is alert with a normal gait.

## 2013-02-11 ENCOUNTER — Other Ambulatory Visit: Payer: Self-pay | Admitting: Internal Medicine

## 2013-02-18 ENCOUNTER — Ambulatory Visit: Payer: Medicare Other | Admitting: *Deleted

## 2013-02-20 ENCOUNTER — Encounter: Payer: Self-pay | Admitting: Internal Medicine

## 2013-02-20 ENCOUNTER — Ambulatory Visit (INDEPENDENT_AMBULATORY_CARE_PROVIDER_SITE_OTHER): Payer: Medicare Other | Admitting: Internal Medicine

## 2013-02-20 VITALS — BP 104/62 | HR 62 | Temp 98.9°F | Ht 69.0 in | Wt 183.4 lb

## 2013-02-20 NOTE — Progress Notes (Signed)
Subjective:     Patient ID: [name], male   DOB: 1933-10-24, 79 y.o.   MRN: [id number]  HPI [name] is a very pleasant 79 year old man returning for management of diabetes mellitus type 2, dx 07/2012, uncontrolled, non-insulin-dependent, with complications (peripheral neuropathy). His PCP is Dr. Cato Mulligan. Pt is here with his wife.  He was initially started on Metformin 500 mg daily that he could tolerate well. His hemoglobin A1c levels were maintained at 7-8%, however, 2 months ago, he started feeling poorly: it all started with a swollen R ankle and leg (venous doppler nl). He also developped severe fatigue, increased thirst and urination, N/V, blurry vision. He came back to be seen by Dr. Selena Batten, and a blood sugar in the office returned at 430. He was given 5 units of NovoLog and water but his sugar rechecked 40 minute later, was still elevated, at 518. He was sent to the ED but also his metformin was increased to 500 mg twice a day. He was referred to endocrinology.  Of note, a hemoglobin A1c checked in the hospital was 15.4%.    Pt has been seen Dr Cato Mulligan approximately 3 weeks ago and a new HbA1C was: Lab Results  Component Value Date   HGBA1C 13.4* 02/03/2013   At our last visit, his sugars have already started to come down on the metformin. I increasing to at the target dose of metformin, and added Januvia. He is now on a regimen of: - Metformin 1000 mg bid - Januvia 100 mg daily  Pt brings a beautifully-kept log, he checks sugars 4 x a day before meals and his sugars are exemplary. Looking back in his log, his sugars right after our last appt were in the 200s, now all CBGs at goal: 93-160. These sugars did not impact the HbA1C much as they gradually became controlled in the last 3 weeks. He started to eat healthier, but cannot exercise b/c R leg pain (LE doppler was neg., but he has a test for PVD coming up).  He has peripheral neuropathy. He did start a B complex vitamin 1 mo ago.  Last eye exam 11/2012: no DR (Dr. London Sheer). No kidney dysfunction.   I reviewed pt's medications, allergies, PMH, social hx, family hx and no changes required.  Review of Systems Constitutional: no weight gain/loss, no fatigue, no subjective hyperthermia/hypothermia Eyes: no blurry vision, no xerophthalmia ENT: no sore throat, no nodules palpated in throat, no dysphagia/odynophagia, no hoarseness Cardiovascular: occasional sharp CP - positional, CP/+ SOB/palpitations/leg swelling Respiratory: no cough/SOB Gastrointestinal: no N/V/D/C Musculoskeletal: + muscle/joint aches - R leg Skin: no rashes Neurological: no tremors/numbness/tingling/+ dizziness - has h/o vertigo Psychiatric: no depression/anxiety  Objective:   Physical Exam BP 104/62  Pulse 62  Temp(Src) 98.9 F (37.2 C) (Oral)  Ht 5\' 9"  (1.753 m)  Wt 183 lb 6 oz (83.178 kg)  BMI 27.07 kg/m2  SpO2 97% Wt Readings from Last 3 Encounters:  02/20/13 183 lb 6 oz (83.178 kg)  02/10/13 182 lb (82.555 kg)  01/21/13 188 lb (85.276 kg)  Constitutional: slightly overweight, in NAD Eyes: PERRLA, EOMI, no exophthalmos ENT: moist mucous membranes, no thyromegaly, no cervical lymphadenopathy Cardiovascular: RRR, No MRG Respiratory: CTA B Gastrointestinal: abdomen soft, NT, ND, BS+ Musculoskeletal: no deformities, strength intact in all 4 Skin: moist, warm, no rashes Neurological: no tremor with outstretched hands, DTR - could not elicit  Assessment:     1. DM2, noninsulin-dependent, uncontrolled, with complications - greatly improved in the  last month - peripheral neuropathy  Plan:     1. Diabetes mellitus type 2, recently uncontrolled - Pt has greatly improved his sugars lately, with almost all sugars at goal - he instituted healthy eating changes (and lost 5 pounds in the last month) and we switched to target dose Metformin and Januvia at last visit - pt's wife asks me whether we can decrease the doses of his meds ot  take him off, but I do not believe we can do this for now >> will wait for the HbA1C to decrease and for him to have stable/target sugars and then maybe attempt to decrease the doses - pt with hammer toes and thicker toenails >> will refer to podiatry per patient's preferrence - continue same regimen - RTC in 3 months >> will check a HbA1C then

## 2013-02-20 NOTE — Patient Instructions (Addendum)
Please return in 3 months with your sugar log. Stay on the same medicines.

## 2013-02-25 ENCOUNTER — Encounter (INDEPENDENT_AMBULATORY_CARE_PROVIDER_SITE_OTHER): Payer: Medicare Other

## 2013-02-25 DIAGNOSIS — I70219 Atherosclerosis of native arteries of extremities with intermittent claudication, unspecified extremity: Secondary | ICD-10-CM

## 2013-02-26 ENCOUNTER — Telehealth: Payer: Self-pay | Admitting: Internal Medicine

## 2013-02-26 NOTE — Telephone Encounter (Signed)
Patient Information:  Caller Name: [place]  Phone: [phone]  Patient: [name], [name]  Gender: Male  DOB: [birth year]  Age: 79 Years  PCP: Birdie Sons (Adults only)  Office Follow Up:  Does the office need to follow up with this patient?: No  Instructions For The Office: N/A   Symptoms  Reason For Call & Symptoms: He states he went yesterday 02/25/13 for a blood flow study on his lower legs.  He had seen Dr. Cato Mulligan for pain in lower legs and swelling on 02/17/13 .  He reports his balance is not steady and can't get his balance. Now using a cane.  Dizziness occures with rising. Grip and strength is weak and has experienced some falls at home over the last few months, he relates this to not picking feet up. Overall weakness with worsing the last 6 months.  GBS #120"s  Eating and drinking well.  Reviewed Health History In EMR: Yes  Reviewed Medications In EMR: Yes  Reviewed Allergies In EMR: Yes  Reviewed Surgeries / Procedures: No  Date of Onset of Symptoms: 02/25/2013  Treatments Tried: Blood flow study on 02/25/13.    Diabetes medications - metformin 1000mg  bid,  Januvia 100mg  daily.  Treatments Tried Worked: No  Guideline(s) Used:  Weakness (Generalized) and Fatigue  Disposition Per Guideline:   See Today in Office/ Patient wants to schedule an appt with Dr. Cato Mulligan. Physician not in office. Requested Dr.Kim  Reason For Disposition Reached:   Patient wants to be seen  Advice Given:  Call Back If:  Unable to stand or walk  Passes out  Breathing difficulty occurs  You become worse.  Patient Will Follow Care Advice:  YES  Appointment Scheduled:  02/27/2013 08:00:00 Appointment Scheduled Provider:  [facility])

## 2013-02-27 ENCOUNTER — Ambulatory Visit: Payer: Self-pay | Admitting: Family Medicine

## 2013-03-07 ENCOUNTER — Telehealth: Payer: Self-pay | Admitting: Internal Medicine

## 2013-03-07 NOTE — Telephone Encounter (Signed)
Caller: [name]/Patient; Phone: [phone]; Reason for Call: Patient states that Dr.  Cato Mulligan wanted him to have an MRI of his back.  He has not heard from the office regarding date/time or if it was scheduled.  Patient requesting Followup on MRI.  Reviewed EPIC.  There is an order that appears to have been sent on 03/03/13 but have no other details. Advised caller the office is closed. I would forward his message and concerns. Encouraged him to contact the office on Monday as well for assistance. Understanding expressed.

## 2013-03-10 NOTE — Telephone Encounter (Signed)
Referral was assigned to [facility]. Re-assigned to [facility] . Will authorize and schedule.

## 2013-03-11 ENCOUNTER — Other Ambulatory Visit: Payer: Self-pay | Admitting: Internal Medicine

## 2013-03-12 ENCOUNTER — Other Ambulatory Visit: Payer: Self-pay | Admitting: Internal Medicine

## 2013-03-12 ENCOUNTER — Telehealth: Payer: Self-pay | Admitting: *Deleted

## 2013-03-12 ENCOUNTER — Encounter: Payer: Self-pay | Admitting: Family Medicine

## 2013-03-12 ENCOUNTER — Other Ambulatory Visit: Payer: Self-pay | Admitting: *Deleted

## 2013-03-12 ENCOUNTER — Ambulatory Visit (INDEPENDENT_AMBULATORY_CARE_PROVIDER_SITE_OTHER): Payer: Medicare Other | Admitting: Family Medicine

## 2013-03-12 VITALS — BP 108/70 | HR 79 | Temp 98.4°F | Wt 173.0 lb

## 2013-03-12 DIAGNOSIS — K529 Noninfective gastroenteritis and colitis, unspecified: Secondary | ICD-10-CM

## 2013-03-12 DIAGNOSIS — K5289 Other specified noninfective gastroenteritis and colitis: Secondary | ICD-10-CM

## 2013-03-12 MED ORDER — CYCLOBENZAPRINE HCL 10 MG PO TABS: 5.0000 mg | ORAL_TABLET | Freq: Three times a day (TID) | ORAL | Status: DC | PRN

## 2013-03-12 NOTE — Telephone Encounter (Signed)
Pt aware, rx sent in electronically     Ok. Call patient. Have him take flexeril 10 mg 1/2 po bid for 6 weeks. Tell him insurance is driving this decision. He can call insurance company and complain ----- Message ----- From: Lavada Mesi, CMA Sent: 03/12/2013 11:13 AM To: Lindley Magnus, MD ----- Message ----- From: Jodelle Gross Fields Sent: 03/12/2013 10:51 AM To: Lavada Mesi, CMA Pts insurance denied approval for the MRI they feel that pt needs to be on pain/ muscle relaxer's for at least 6 weeks then have a recheck to re-evaluate the pain. Please Advise

## 2013-03-12 NOTE — Progress Notes (Signed)
Chief Complaint  Patient presents with  . Diarrhea    x 4 days; weakenss, achy all over; nausea     HPI:  Acute visit for diarrhea: -started 3-4 days ago -watery diarrhea several times per day with some nausea, no vomiting, weakness, had some intermittent difuse cramping first day - this has improved -able to tolerate POs good -BS had been great - reports in 90s and hundreds -denies: abd pain, urinary symptoms, blood in stools, mucus in stools, recent abx or travel Has recently seen his PCP and his endocrinologist and per review of notes BS have been well controlled  ROS: See pertinent positives and negatives per HPI.  Past Medical History  Diagnosis Date  . Diabetes mellitus     type 2  . Diverticulosis of colon   . GERD (gastroesophageal reflux disease)   . Osteoarthritis   . Pneumonia 2002    history of  . Stress incontinence, male     urge  . Dyslipidemia   . Hernia, hiatal   . Hyperlipidemia   . Hypertension   . DVT (deep venous thrombosis)     Family History  Problem Relation Age of Onset  . Other Mother 56    CABG  . Heart disease Mother   . Hypertension Mother   . Hyperlipidemia Son     History   Social History  . Marital Status: Married    Spouse Name: N/A    Number of Children: N/A  . Years of Education: N/A   Social History Main Topics  . Smoking status: Never Smoker   . Smokeless tobacco: Never Used  . Alcohol Use: No  . Drug Use: No  . Sexually Active: None   Other Topics Concern  . 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  , Disp: , Rfl: ;  Cholecalciferol (VITAMIN D-3) 1000 UNITS CAPS, Take 1 capsule by mouth daily., Disp: , Rfl: ;  cyanocobalamin 100 MCG tablet, Take 100 mcg by  mouth daily., Disp: , Rfl: ;  doxazosin (CARDURA) 4 MG tablet, TAKE 1 TABLET (4 MG TOTAL) BY MOUTH DAILY., Disp: 90 tablet, Rfl: 0 glucose blood test strip, 1 each by Other route 3 (three) times daily as needed. Use as instructed, Disp: , Rfl: ;  metFORMIN (GLUCOPHAGE) 1000 MG tablet, Take 1 tablet (1,000 mg total) by mouth 2 (two) times daily with a meal., Disp: 180 tablet, Rfl: 3;  Multiple Vitamin (MULTIVITAMIN) tablet, Take 1 tablet by mouth daily., Disp: , Rfl: ;  Naproxen Sodium (ALEVE) 220 MG CAPS, Take 1 capsule by mouth 2 (two) times daily., Disp: , Rfl:  omeprazole (PRILOSEC) 20 MG capsule, Take 20 mg by mouth daily., Disp: , Rfl: ;  ONETOUCH DELICA LANCETS 33G MISC, 1 each daily., Disp: , Rfl: ;  simvastatin (ZOCOR) 20 MG tablet, TAKE ONE TABLET BY MOUTH DAILY, Disp: 90 tablet, Rfl: 1;  sitaGLIPtin (JANUVIA) 100 MG tablet, Take 1 tablet (100 mg total) by mouth daily., Disp: 90 tablet, Rfl: 1  EXAM:  Filed Vitals:   03/12/13 1105  BP: 108/70  Pulse: 79  Temp: 98.4 F (36.9 C)    Body mass index is 25.54 kg/(m^2).  GENERAL:  vitals reviewed and listed above, alert, oriented, appears well hydrated and in no acute distress  HEENT: atraumatic, conjunttiva clear, no obvious abnormalities on inspection of external nose and ears  NECK: no obvious masses on inspection  LUNGS: clear to auscultation bilaterally, no wheezes, rales or rhonchi, good air movement  CV: HRRR, no peripheral edema  ABD: BS+, soft, NTTP  MS: moves all extremities without noticeable abnormality  PSYCH: pleasant and cooperative, no obvious depression or anxiety  ASSESSMENT AND PLAN:  Discussed the following assessment and plan:  Gastroenteritis  -appears well, benign exam, symptoms c/w gastroenteritis - supportive care, oral rehydration, return and emergency precautions -also discussed if not eating and lower BS may want to stop metformin for a few days and monitor sugars which have been  excellent, -Patient advised to return or notify a doctor immediately if symptoms worsen or persist or new concerns arise.  There are no Patient Instructions on file for this visit.   Kriste Basque R.

## 2013-03-12 NOTE — Patient Instructions (Signed)
-  plenty of fluids  -imodium as needed per instructions  -follow up if worsening, not tolerating PO intake, not improving

## 2013-04-10 ENCOUNTER — Telehealth: Payer: Self-pay | Admitting: Internal Medicine

## 2013-04-10 DIAGNOSIS — R42 Dizziness and giddiness: Secondary | ICD-10-CM

## 2013-04-10 DIAGNOSIS — G609 Hereditary and idiopathic neuropathy, unspecified: Secondary | ICD-10-CM

## 2013-04-10 NOTE — Telephone Encounter (Signed)
Patient would like a call from the MD. Please assist.

## 2013-04-11 ENCOUNTER — Ambulatory Visit: Payer: Self-pay | Admitting: Family Medicine

## 2013-04-11 NOTE — Telephone Encounter (Signed)
Insurance declined MRI and he is still having pain down legs and no energy, SOB, dizziness.  Wants to know what to the next step is. Pt states this has been going on for a month and is ok to wait and see what Dr Cato Mulligan thinks.  Denies chest pain.  Told pt if he develops chest pain or increased SOB to go to the ER. He verbalized understanding and felt he could wait for Dr Cato Mulligan opinion.  He understands that it may be Monday till he hears anything.

## 2013-04-14 NOTE — Telephone Encounter (Signed)
Refer to neurology

## 2013-04-14 NOTE — Telephone Encounter (Signed)
Referral order placed.

## 2013-04-23 ENCOUNTER — Ambulatory Visit: Payer: Self-pay | Admitting: Internal Medicine

## 2013-05-09 ENCOUNTER — Ambulatory Visit: Payer: Medicare Other | Admitting: Neurology

## 2013-05-26 ENCOUNTER — Ambulatory Visit: Payer: Medicare Other | Admitting: Internal Medicine

## 2013-05-27 ENCOUNTER — Telehealth: Payer: Self-pay | Admitting: Internal Medicine

## 2013-05-27 ENCOUNTER — Ambulatory Visit (INDEPENDENT_AMBULATORY_CARE_PROVIDER_SITE_OTHER): Payer: Medicare Other | Admitting: Internal Medicine

## 2013-05-27 ENCOUNTER — Encounter: Payer: Self-pay | Admitting: Internal Medicine

## 2013-05-27 VITALS — BP 110/58 | HR 60 | Temp 97.8°F | Resp 10 | Ht 69.0 in | Wt 170.0 lb

## 2013-05-27 VITALS — BP 114/74 | Temp 98.3°F | Wt 171.0 lb

## 2013-05-27 DIAGNOSIS — E119 Type 2 diabetes mellitus without complications: Secondary | ICD-10-CM

## 2013-05-27 DIAGNOSIS — R29898 Other symptoms and signs involving the musculoskeletal system: Secondary | ICD-10-CM

## 2013-05-27 DIAGNOSIS — IMO0002 Reserved for concepts with insufficient information to code with codable children: Secondary | ICD-10-CM

## 2013-05-27 DIAGNOSIS — IMO0001 Reserved for inherently not codable concepts without codable children: Secondary | ICD-10-CM

## 2013-05-27 LAB — HEMOGLOBIN A1C: Hgb A1c MFr Bld: 7.6 % — ABNORMAL HIGH (ref 4.6–6.5)

## 2013-05-27 MED ORDER — AMLODIPINE BESYLATE 5 MG PO TABS: 2.5000 mg | ORAL_TABLET | Freq: Every day | ORAL | Status: DC

## 2013-05-27 MED ORDER — DOXAZOSIN MESYLATE 4 MG PO TABS: 2.0000 mg | ORAL_TABLET | Freq: Every day | ORAL | Status: DC

## 2013-05-27 NOTE — Telephone Encounter (Signed)
Great. Thank you.

## 2013-05-27 NOTE — Progress Notes (Signed)
Patient ID: [name], male   DOB: 1933/01/21, 79 y.o.   MRN: [id number]  Severe back pain with radiation to right leg for 4 weeks. Has been using muscle relaxers for the past 30 days and has had no relief. He had one fall because "leg gave out".   DM-- states cbgs in 130-150 range.   Reviewed pmh, psh, sochx Reviewed meds   patient denies chest pain, shortness of breath, orthopnea. Denies lower extremity edema, abdominal pain, change in appetite, change in bowel movements. Patient denies rashes, musculoskeletal complaints. No other specific complaints in a complete review of systems except he admits to fatigue and orthostatic sxs.    well-developed well-nourished male in no acute distress. HEENT exam atraumatic, normocephalic, neck supple without jugular venous distention. Chest clear to auscultation cardiac exam S1-S2 are regular.  Neuro: broad based gait, 4/5 strength on right (quadriceps and iliopsoas) Absent reflexes on right lower extremity at knee and ankle.  Back pain with radicular sxs and right leg weakness. Needs further evaluation as he did previously (insurance declined coverage and patient can't afford)  We will try to reorder MRI. If insurance declines again than refer to neurology/ortho   Orthostatic sxs - decrease doxazosin to 1/2 tablet Decrease amlodipine to 1/2 tab (2.5 mg)

## 2013-05-27 NOTE — Telephone Encounter (Signed)
Patient called in stating that the name of the foot cream that he uses is Magnilife diabetic neuropathy foot cream.

## 2013-05-27 NOTE — Telephone Encounter (Signed)
Please read note below

## 2013-05-27 NOTE — Patient Instructions (Signed)
Continue current regimen.    Keep up the good work!

## 2013-05-27 NOTE — Progress Notes (Signed)
Subjective:     Patient ID: [name], male   DOB: 19-Jan-1933, 79 y.o.   MRN: [id number]  HPI [name] is a very pleasant 79 year old man returning for management of DM2, dx 07/2012, uncontrolled, non-insulin-dependent, with complications (peripheral neuropathy). Pt is here with his wife. Last visit 3 mo ago.  Last HbA1C was: Lab Results  Component Value Date   HGBA1C 13.4* 02/03/2013   He is on a regimen of: - Metformin 1000 mg bid - Januvia 100 mg daily He is tolerating these well.  Pt again brings a beautifully-kept log, he checks sugars 2x a day before meals and his sugars are at goal: 100-140. No lows. He started to eat healthier, but cannot exercise b/c of back pain with radiation to R. This is worse lately and he has had an MRI ordered, however this is not covered by his insurance. A lumbar x-ray in 2009 showed osteoarthritis of the lumbar spine.  He has peripheral neuropathy. He is taking a B complex vitamin, and he got a neuropathy cream as a present for Father's Day, and this relieved some of the pins and needles sensation. He does not remember the name of the cream, but will call back to let me know. Last eye exam 11/2012: no DR (Dr. London Sheer). No kidney dysfunction.   I reviewed pt's medications, allergies, PMH, social hx, family hx and no changes required, except for the fact that he saw Dr. Cato Mulligan today and, because of dizziness/orthostatism, his Doxazosin and Amlodipine dose is were decreased by half.  Review of Systems Constitutional: + weight oss, + fatigue, no subjective hyperthermia/hypothermia, + poor sleep, + nocturia >1 Eyes: no blurry vision, no xerophthalmia ENT: no sore throat, no dysphagia/odynophagia, + hoarseness Cardiovascular: no CP/no SOB/palpitations/leg swelling Respiratory: no cough/SOB Gastrointestinal: no N/V/D/C Musculoskeletal: + muscle/joint aches - R leg Skin: no rashes Neurological: no tremors/numbness/tingling/+ dizziness - has h/o  vertigo, + HA Psychiatric: no depression/anxiety  Objective:   Physical Exam BP 110/58  Pulse 60  Temp(Src) 97.8 F (36.6 C) (Oral)  Resp 10  Ht 5\' 9"  (1.753 m)  Wt 170 lb (77.111 kg)  BMI 25.09 kg/m2  SpO2 97% Wt Readings from Last 3 Encounters:  05/27/13 170 lb (77.111 kg)  05/27/13 171 lb (77.565 kg)  03/12/13 173 lb (78.472 kg)  Constitutional: normal weight, in NAD Eyes: PERRLA, EOMI, no exophthalmos ENT: moist mucous membranes, no thyromegaly, no cervical lymphadenopathy Cardiovascular: RRR, No MRG Respiratory: CTA B Gastrointestinal: abdomen soft, NT, ND, BS+ Musculoskeletal: no deformities, strength intact in all 4 Skin: moist, warm, no rashes Neurological: no tremor with outstretched hands, DTR - could not elicit  Assessment:     1. DM2, noninsulin-dependent, uncontrolled, with complications - greatly improved in the last months - peripheral neuropathy  Plan:     1. Diabetes mellitus type 2, with recent improved control - Pt has greatly improved his sugars in the last few months - he instituted healthy eating changes, lost weight to a normal BMI now, and is very conscientious about taking his medicines and keeping a good CBG log - we discussed about the fact that a good HbA1C target for him would be 7-7.5%, and he appears to be lower than 7% now - however, he does not check sugar after meals and we might miss some highs.  - I advised him to occasionally check before and 2h after a meal, but for now, I do not think we need any change in his diabetic regimen -  will check HbA1C and ACR today. He is not on an ACEI or ARB. - RTC in 3 months with sugar log - does not have a computer at home so he cannot join my chart  Office Visit on 05/27/2013  Component Date Value Range Status  . Hemoglobin A1C 05/27/2013 7.6* 4.6 - 6.5 % Final   Glycemic Control Guidelines for People with Diabetes:Non Diabetic:  <6%Goal of Therapy: <7%Additional Action Suggested:  >8%   .  Microalb, Ur 05/27/2013 0.2  0.0 - 1.9 mg/dL Final  . Creatinine,U 16/09/9603 109.7   Final  . Microalb Creat Ratio 05/27/2013 0.2  0.0 - 30.0 mg/g Final   Great improvement in Hba1C. He is at target for age. No MAU. Will send letter.

## 2013-05-28 LAB — MICROALBUMIN / CREATININE URINE RATIO: Microalb Creat Ratio: 0.2 mg/g (ref 0.0–30.0)

## 2013-05-29 ENCOUNTER — Encounter: Payer: Self-pay | Admitting: Internal Medicine

## 2013-06-04 ENCOUNTER — Encounter: Payer: Self-pay | Admitting: Neurology

## 2013-06-04 ENCOUNTER — Ambulatory Visit (INDEPENDENT_AMBULATORY_CARE_PROVIDER_SITE_OTHER): Payer: Medicare Other | Admitting: Neurology

## 2013-06-04 VITALS — BP 104/58 | HR 60 | Temp 98.1°F | Ht 69.0 in | Wt 168.0 lb

## 2013-06-04 DIAGNOSIS — M48061 Spinal stenosis, lumbar region without neurogenic claudication: Secondary | ICD-10-CM

## 2013-06-04 DIAGNOSIS — I951 Orthostatic hypotension: Secondary | ICD-10-CM

## 2013-06-04 NOTE — Progress Notes (Signed)
[name] is a 79 -year-old male with a history of diabetes as well as a prior history of knee replacements and in the 1960s he had surgery for a ruptured lumbar disc.  He further states that about 10 or 12 years ago he had episodes of dizziness which would be either side to side movement or sometimes spinning it could last as much as 3 hours.  He would usually come on first thing in the morning and he would lay down and maybe take an over-the-counter medicine and after 2-3 hours it would be gone.  This pretty well when away but in the last couple of years he may have one or 2 episodes per year.  Now in the last for 6 months, he is also having episodes of lightheadedness usually after he had been bending over and then stands up, or when he gets up quickly.  Dr. Cato Mulligan just evaluated him for this and he cut his blood pressure medications in half.  This has resulted in a significant decrease in the symptoms.  In addition, he has fallen a few times in the last 4 months.  This is usually to catching the front of his foot on the ground, usually on the right foot but once it occurred on the left foot.  He does complain of pain running down the right leg worse with walking and pain in the hips if he walks any significant distance.  He will improve with the period of rest.  He has had steroid injections in his back which used to help for several months but the last time he got it out for very short period of time. He also takes prostate medication and his diabetes medication and his recent cholesterol levels were low.  Remainder of the review of systems is negative.  He states that he is scheduled for a lumbar MRI tomorrow.  Past Medical History  Diagnosis Date  . Diabetes mellitus     type 2  . Diverticulosis of colon   . GERD (gastroesophageal reflux disease)   . Osteoarthritis   . Pneumonia 2002    history of  . Stress incontinence, male     urge  . Dyslipidemia   . Hernia, hiatal   . Hyperlipidemia    . Hypertension   . DVT (deep venous thrombosis)     Current Outpatient Prescriptions on File Prior to Visit  Medication Sig Dispense Refill  . amLODipine (NORVASC) 5 MG tablet Take 0.5 tablets (2.5 mg total) by mouth daily.      Marland Kitchen aspirin 81 MG tablet Take 81 mg by mouth daily.        Marland Kitchen b complex vitamins tablet Take 1 tablet by mouth daily.      . Cholecalciferol (VITAMIN D-3) 1000 UNITS CAPS Take 1 capsule by mouth daily.      . Chromium-Cinnamon [id number] MCG-MG CAPS Take 1 capsule by mouth daily.      Marland Kitchen doxazosin (CARDURA) 4 MG tablet Take 0.5 tablets (2 mg total) by mouth at bedtime.  90 tablet  0  . glucose blood test strip 1 each by Other route 3 (three) times daily as needed. Use as instructed      . metFORMIN (GLUCOPHAGE) 1000 MG tablet Take 1 tablet (1,000 mg total) by mouth 2 (two) times daily with a meal.  180 tablet  3  . Multiple Vitamin (MULTIVITAMIN) tablet Take 1 tablet by mouth daily.      . Naproxen Sodium (ALEVE) 220 MG CAPS  Take 1 capsule by mouth 2 (two) times daily.      Marland Kitchen omeprazole (PRILOSEC) 20 MG capsule Take 20 mg by mouth daily.      Letta Pate DELICA LANCETS 33G MISC 1 each daily.      . simvastatin (ZOCOR) 20 MG tablet TAKE ONE TABLET BY MOUTH DAILY  90 tablet  1  . sitaGLIPtin (JANUVIA) 100 MG tablet Take 1 tablet (100 mg total) by mouth daily.  90 tablet  1   No current facility-administered medications on file prior to visit.   Ciprofloxacin and Sulfamethoxazole  History   Social History  . Marital Status: Married    Spouse Name: N/A    Number of Children: N/A  . Years of Education: N/A   Occupational History  . Not on file.   Social History Main Topics  . Smoking status: Never Smoker   . Smokeless tobacco: Never Used  . Alcohol Use: No  . Drug Use: No  . Sexually Active: Not on file   Other Topics Concern  . 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 Other Mother 63    CABG  . Heart disease Mother   . Hypertension Mother   . Hyperlipidemia Son    BP 104/58  Pulse 60  Temp(Src) 98.1 F (36.7 C)  Ht 5\' 9"  (1.753 m)  Wt 168 lb (76.204 kg)  BMI 24.8 kg/m2   Alert and oriented x 3.  Memory function appears to be intact.  Concentration and attention are normal for educational level and background.  Speech is fluent and without significant word finding difficulty.  Is aware of current events.  No carotid bruits detected.  Cranial nerve II through XII revealsnormal optic discs and acuity, EOMI, PERLA, facial movement and sensation intact, hearing diminished bilaterally, gag intact,Uvula raises symmetrically and tongue protrudes evenly. Motor strength is 5 over 5 throughout the upper limbs. He has 4/5 weakness of the right ankle for plantar flexion as well as dorsiflexion and eversion.  He also has 4/5 weakness of left ankle dorsiflexion and eversion.  5 minus weakness of psoas function No atrophy, abnormal tone or tremors. Reflexes are 1-2+ and symmetric in the upperextremities absent in the lower extremities Sensory exam is intact for cold and pinprick sensation except in the toes. Vibratory sense is diminished in the right lower tremor he below-the-knee Coordination is intact for fine movements and rapid alternating movements in all limbs Gait reveals that he is unable to do toe walking on the right foot and he has difficulty with heel walking on both feet.    Impression: 1. Past history of vertigo occurring now once or twice a year managed with over-the-counter medications and 2 hours of rest. 2. orthostasis symptoms improve with reduction of blood pressure medication.  Not completely resolve his blood pressure still is running low. 3. Sciatica, gait disturbance, weakness in the lower extremities.  Most likely this is due to significant lumbar stenosis he does have a  prior history of lumbar disc surgery in the 60s.  Sterile injections have become decreasingly effective.  Plan: 1. He can continue his current management for the vertigo 2. If the orthostasis does not resolve further, consider further reductions in blood pressure medications. 3. Agree with lumbar MRI. This does  show significant lumbar stenosis, then I would recommend referral to a spine surgeon.  I will leave a note for myself to review his MRI results.

## 2013-06-05 ENCOUNTER — Ambulatory Visit
Admission: RE | Admit: 2013-06-05 | Discharge: 2013-06-05 | Disposition: A | Discharge status: Home / Self Care | Payer: 59 | Source: Ambulatory Visit | Attending: Internal Medicine | Admitting: Internal Medicine

## 2013-06-05 DIAGNOSIS — IMO0001 Reserved for inherently not codable concepts without codable children: Secondary | ICD-10-CM

## 2013-06-05 DIAGNOSIS — R29898 Other symptoms and signs involving the musculoskeletal system: Secondary | ICD-10-CM

## 2013-06-10 ENCOUNTER — Telehealth: Payer: Self-pay | Admitting: Internal Medicine

## 2013-06-10 ENCOUNTER — Telehealth: Payer: Self-pay | Admitting: Neurology

## 2013-06-10 DIAGNOSIS — M48061 Spinal stenosis, lumbar region without neurogenic claudication: Secondary | ICD-10-CM

## 2013-06-10 NOTE — Telephone Encounter (Signed)
MRI results show a narrowing of the spine and spinal stenosis.  States was sent back to Dr. Cato Mulligan for next steps.  Patient would like call back after Dr. Cato Mulligan can review the results and Dr. Hyacinth Meeker notes.  Patient also has sent his MRI to [facility] and will have results from them, as well.  Patient awaiting next steps from Dr. Cato Mulligan.  Info to office for provider review/callback.  May reach patient at [phone]. krs/can

## 2013-06-10 NOTE — Telephone Encounter (Signed)
Spoke with the patient and information given as per Dr. Smiley Houseman. The patient will call Dr. Cato Mulligan' office to f/u. I also sent a message to Dr. Cato Mulligan as well.

## 2013-06-10 NOTE — Telephone Encounter (Signed)
Message copied by Benay Spice on Tue Jun 10, 2013 12:35 PM ------      Message from: [place], [place] L      Created: Mon Jun 09, 2013 11:13 AM      Regarding: mri       Let patient know his MRI confirms severe nerve pinching and stenosis of the lumbar spine and ssurgery consult is recommended,  His primary care should be able to make a referral            MS      ----- Message -----         From: Benay Spice, RN         Sent: 06/04/2013  11:40 AM           To: Michael L. Soo, MD      Subject: review MRI L spine                                       Just a reminder to review this patient's MRI which is scheduled for 06/05/13.       ------

## 2013-06-12 NOTE — Telephone Encounter (Signed)
Pt aware, he had MRI sent to [facility] for review also.  He will see what they say and then call back if they agree with Dr Cato Mulligan for referral

## 2013-06-12 NOTE — Addendum Note (Signed)
Addended by: Alfred Levins D on: 06/12/2013 04:56 PM   Modules accepted: Orders

## 2013-06-12 NOTE — Telephone Encounter (Signed)
Pt called back and changed his mind, he would like to proceed with neuro referral.  Referral order placed

## 2013-06-12 NOTE — Telephone Encounter (Signed)
He does have spinal stenosis. Refer to neurosurgery for second opinion.

## 2013-06-30 ENCOUNTER — Other Ambulatory Visit: Payer: Self-pay | Admitting: Internal Medicine

## 2013-07-17 ENCOUNTER — Other Ambulatory Visit: Payer: Self-pay | Admitting: *Deleted

## 2013-07-17 DIAGNOSIS — E119 Type 2 diabetes mellitus without complications: Secondary | ICD-10-CM

## 2013-07-17 MED ORDER — SITAGLIPTIN PHOSPHATE 100 MG PO TABS: 100.0000 mg | ORAL_TABLET | Freq: Every day | ORAL | Status: DC

## 2013-07-21 ENCOUNTER — Other Ambulatory Visit: Payer: Self-pay | Admitting: Neurosurgery

## 2013-07-21 DIAGNOSIS — M542 Cervicalgia: Secondary | ICD-10-CM

## 2013-07-24 ENCOUNTER — Ambulatory Visit
Admission: RE | Admit: 2013-07-24 | Discharge: 2013-07-24 | Disposition: A | Discharge status: Home / Self Care | Payer: 59 | Source: Ambulatory Visit | Attending: Neurosurgery | Admitting: Neurosurgery

## 2013-07-24 DIAGNOSIS — M542 Cervicalgia: Secondary | ICD-10-CM

## 2013-08-06 ENCOUNTER — Other Ambulatory Visit (INDEPENDENT_AMBULATORY_CARE_PROVIDER_SITE_OTHER): Payer: 59

## 2013-08-06 DIAGNOSIS — I10 Essential (primary) hypertension: Secondary | ICD-10-CM

## 2013-08-06 LAB — CBC WITH DIFFERENTIAL/PLATELET
Basophils Absolute: 0 10*3/uL (ref 0.0–0.1)
Eosinophils Absolute: 0.1 10*3/uL (ref 0.0–0.7)
Lymphocytes Relative: 26 % (ref 12.0–46.0)
MCHC: 33.9 g/dL (ref 30.0–36.0)
Neutrophils Relative %: 64.2 % (ref 43.0–77.0)
Platelets: 141 10*3/uL — ABNORMAL LOW (ref 150.0–400.0)
RBC: 4.21 Mil/uL — ABNORMAL LOW (ref 4.22–5.81)
RDW: 14 % (ref 11.5–14.6)

## 2013-08-07 ENCOUNTER — Other Ambulatory Visit: Payer: Self-pay | Admitting: Neurosurgery

## 2013-08-07 LAB — BASIC METABOLIC PANEL
CO2: 29 mEq/L (ref 19–32)
Calcium: 9.8 mg/dL (ref 8.4–10.5)
Creatinine, Ser: 0.7 mg/dL (ref 0.4–1.5)
Glucose, Bld: 145 mg/dL — ABNORMAL HIGH (ref 70–99)

## 2013-08-07 LAB — HEPATIC FUNCTION PANEL
Alkaline Phosphatase: 31 U/L — ABNORMAL LOW (ref 39–117)
Bilirubin, Direct: 0.2 mg/dL (ref 0.0–0.3)
Total Bilirubin: 0.8 mg/dL (ref 0.3–1.2)

## 2013-08-07 LAB — LIPID PANEL
HDL: 49 mg/dL (ref >39.00)
Total CHOL/HDL Ratio: 2
Triglycerides: 42 mg/dL (ref 0.0–149.0)

## 2013-08-08 ENCOUNTER — Telehealth: Payer: Self-pay | Admitting: Internal Medicine

## 2013-08-08 ENCOUNTER — Encounter (HOSPITAL_COMMUNITY): Payer: Self-pay | Admitting: Pharmacy Technician

## 2013-08-08 NOTE — Telephone Encounter (Signed)
He can hold Metformin and Januvia in the day of the surgery.

## 2013-08-08 NOTE — Telephone Encounter (Signed)
Returned pt's call. He stated he is having back surgery on 9/4 and he wants to know if there are any meds that he needs to stop for more than 24 hrs prior to surgery. Please advise.

## 2013-08-08 NOTE — Telephone Encounter (Signed)
Going for surg 08/14/13. Has questions about stopping meds prior to surg.Please call / Sherri S.

## 2013-08-08 NOTE — Telephone Encounter (Signed)
Called pt and advised him to hold the metformin and januvia on the day of surgery. Pt understood.

## 2013-08-13 ENCOUNTER — Encounter (HOSPITAL_COMMUNITY): Payer: Self-pay | Admitting: Anesthesiology

## 2013-08-13 ENCOUNTER — Ambulatory Visit (HOSPITAL_COMMUNITY)
Admission: RE | Admit: 2013-08-13 | Discharge: 2013-08-13 | Disposition: A | Discharge status: Home / Self Care | Payer: Medicare Other | Source: Ambulatory Visit | Attending: Anesthesiology | Admitting: Anesthesiology

## 2013-08-13 ENCOUNTER — Encounter: Payer: Self-pay | Admitting: Internal Medicine

## 2013-08-13 ENCOUNTER — Ambulatory Visit (INDEPENDENT_AMBULATORY_CARE_PROVIDER_SITE_OTHER): Payer: Medicare Other | Admitting: Internal Medicine

## 2013-08-13 ENCOUNTER — Encounter (HOSPITAL_COMMUNITY)
Admission: RE | Admit: 2013-08-13 | Discharge: 2013-08-13 | Disposition: A | Discharge status: Home / Self Care | Payer: Medicare Other | Source: Ambulatory Visit | Attending: Neurosurgery | Admitting: Neurosurgery

## 2013-08-13 ENCOUNTER — Encounter (HOSPITAL_COMMUNITY): Payer: Self-pay

## 2013-08-13 VITALS — BP 126/66 | HR 64 | Temp 97.9°F | Wt 167.0 lb

## 2013-08-13 DIAGNOSIS — I70219 Atherosclerosis of native arteries of extremities with intermittent claudication, unspecified extremity: Secondary | ICD-10-CM

## 2013-08-13 DIAGNOSIS — I10 Essential (primary) hypertension: Secondary | ICD-10-CM

## 2013-08-13 DIAGNOSIS — G609 Hereditary and idiopathic neuropathy, unspecified: Secondary | ICD-10-CM

## 2013-08-13 DIAGNOSIS — Z23 Encounter for immunization: Secondary | ICD-10-CM

## 2013-08-13 DIAGNOSIS — E119 Type 2 diabetes mellitus without complications: Secondary | ICD-10-CM

## 2013-08-13 HISTORY — DX: Overactive bladder: N32.81

## 2013-08-13 HISTORY — DX: Irritable bowel syndrome, unspecified: K58.9

## 2013-08-13 LAB — SURGICAL PCR SCREEN
MRSA, PCR: NEGATIVE
Staphylococcus aureus: POSITIVE — AB

## 2013-08-13 MED ORDER — CEFAZOLIN SODIUM-DEXTROSE 2-3 GM-% IV SOLR
2.0000 g | INTRAVENOUS | Status: DC
  Filled 2013-08-13: qty 50

## 2013-08-13 NOTE — Pre-Procedure Instructions (Signed)
Eunice Oldaker Lemme  08/13/2013   Your procedure is scheduled on: Thursday, August 14, 2013  Report to [facility] at 10:00 AM.  Call this number if you have problems the morning of surgery: [phone]   Remember:   Do not eat food or drink liquids after midnight.   Take these medicines the morning of surgery with A SIP OF WATER: amLODipine (NORVASC) 5 MG tablet, omeprazole (PRILOSEC)  20 MG capsule,  Stop taking Aspirin, Multiple Vitamin, and herbal medications (Chromium-Cinnamon [phone] MCG-MG CAPS)    Do not take any NSAIDs ie: Ibuprofen, Advil, Naproxen (ALEVE) or any medication containing Aspirin.  Do not wear jewelry, make-up or nail polish.  Do not wear lotions, powders, or perfumes. You may wear deodorant.  Do not shave 48 hours prior to surgery. Men may shave face and neck.  Do not bring valuables to the hospital.  [facility] is not responsible for any belongings or valuables.  Contacts, dentures or bridgework may not be worn into surgery.  Leave suitcase in the car. After surgery it may be brought to your room.  For patients admitted to the hospital, checkout time is 11:00 AM the day of discharge.   Patients discharged the day of surgery will not be allowed to drive home.  Name and phone number of your driver:  Special Instructions: Shower using CHG 2 nights before surgery and the night before surgery.  If you shower the day of surgery use CHG.  Use special wash - you have one bottle of CHG for all showers.  You should use approximately 1/3 of the bottle for each shower.   Please read over the following fact sheets that you were given: Pain Booklet, Coughing and Deep Breathing, MRSA Information and Surgical Site Infection Prevention

## 2013-08-13 NOTE — Progress Notes (Signed)
Scheduled for back surgery with Dr. Lovell Sheehan tomorrow- he feels well and has no complaints other than back pain  DM- followed by dr Elvera Lennox  Vascular disease- no sxs of CP, SOB, PND  htn- no home bps- tolerating meds  Past Medical History  Diagnosis Date  . Diabetes mellitus     type 2  . Diverticulosis of colon   . GERD (gastroesophageal reflux disease)   . Osteoarthritis   . Pneumonia 2002    history of  . Stress incontinence, male     urge  . Dyslipidemia   . Hernia, hiatal   . Hyperlipidemia   . Hypertension   . DVT (deep venous thrombosis)     History   Social History  . Marital Status: Married    Spouse Name: N/A    Number of Children: N/A  . Years of Education: N/A   Occupational History  . Not on file.   Social History Main Topics  . Smoking status: Never Smoker   . Smokeless tobacco: Never Used  . Alcohol Use: No  . Drug Use: No  . Sexual Activity: Not on file   Other Topics Concern  . 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 Wrist tendon surgery    . Cts bilateral    . Knee arthroscopy  1988, 1992    bilateral  . Total knee arthroplasty  2005, 2007  . Lumbar left arm  1961    Family History  Problem Relation Age of Onset  . Other Mother 2    CABG  . Heart disease Mother   . Hypertension Mother   . Hyperlipidemia Son     Allergies  Allergen Reactions  . Ciprofloxacin Other (See Comments)    "made me have a weird feeling. Disoriented."   . Sulfamethoxazole     REACTION: rash    Current Outpatient Prescriptions on File Prior to Visit  Medication Sig Dispense Refill  . amLODipine (NORVASC) 5 MG tablet Take 2.5 mg by mouth daily.      Marland Kitchen aspirin 81 MG tablet Take 81 mg by mouth daily.        Marland Kitchen b complex vitamins tablet Take 1 tablet by mouth daily.      . Cholecalciferol  (VITAMIN D-3) 1000 UNITS CAPS Take 1 capsule by mouth daily.      . Chromium-Cinnamon [phone] MCG-MG CAPS Take 1 capsule by mouth daily.      Marland Kitchen doxazosin (CARDURA) 4 MG tablet Take 2 mg by mouth at bedtime.      . fesoterodine (TOVIAZ) 4 MG TB24 tablet Take 4 mg by mouth daily.      Marland Kitchen glucose blood test strip 1 each by Other route 3 (three) times daily as needed. Use as instructed      . metFORMIN (GLUCOPHAGE) 1000 MG tablet Take 1,000 mg by mouth 2 (two) times daily with a meal.      . Multiple Vitamin (MULTIVITAMIN) tablet Take 1 tablet by mouth daily.      . Naproxen Sodium (ALEVE) 220 MG CAPS Take 1 capsule by mouth 2 (two) times daily.      Marland Kitchen omeprazole (PRILOSEC) 20 MG capsule Take 20 mg by mouth daily.      Letta Pate DELICA LANCETS 33G MISC 1 each  daily.      . simvastatin (ZOCOR) 20 MG tablet Take 20 mg by mouth every evening.      . sitaGLIPtin (JANUVIA) 100 MG tablet Take 100 mg by mouth daily.       No current facility-administered medications on file prior to visit.     patient denies chest pain, shortness of breath, orthopnea. Denies lower extremity edema, abdominal pain, change in appetite, change in bowel movements. Patient denies rashes, musculoskeletal complaints. No other specific complaints in a complete review of systems.   BP 126/66  Pulse 64  Temp(Src) 97.9 F (36.6 C) (Oral)  Wt 167 lb (75.751 kg)  BMI 24.65 kg/m2   well-developed well-nourished male in no acute distress. HEENT exam atraumatic, normocephalic, neck supple without jugular venous distention. Chest clear to auscultation cardiac exam S1-S2 are regular. Abdominal exam overweight with bowel sounds, soft and nontender. Extremities no edema. Neurologic exam is alert with a normal gait.

## 2013-08-13 NOTE — Assessment & Plan Note (Signed)
No sxs of CP or claudication currently

## 2013-08-13 NOTE — Assessment & Plan Note (Signed)
Likely due to DM

## 2013-08-13 NOTE — Progress Notes (Signed)
Pt positive for Staph; please treat DOS.

## 2013-08-13 NOTE — Progress Notes (Signed)
Anesthesiology Chart Review:  79 year old male with lumbar spinal stenosis, htn and Type 2 DM scheduled for L2-5 lumbar decompression and laminectomies 08/14/13 by Dr. Lovell Sheehan. Pt denies chest pain or SOB. The Stop Brennan Bailey questionnaire was suggestive of possible OSA.  ECG shows SR with PVC.  Labs notable for glucose 375. Creatinine 0.7  Patient should be OK for surgery tomorrow. He will be evaluated by his assigned anesthesiologist prior to surgery  [name]

## 2013-08-13 NOTE — Assessment & Plan Note (Signed)
Well controlled Continue same meds 

## 2013-08-13 NOTE — Progress Notes (Signed)
Pt denies having SOB, chest pain, and being under the care of a cardiologist. Pt denies having a chest x ray and EKG within the last year. Pt denies having and echo but states that he had a stress test 8-10 years ago at [place] and a cardiac cath 20-25 years ago here at [facility]. Pt PCP Dr. Birdie Sons [facility]) made aware of pt STOP-BANG Assessment Tool results. Pt EKG for review by Dr. [name] (anesthesia).

## 2013-08-13 NOTE — Progress Notes (Signed)
08/13/13 1028  OBSTRUCTIVE SLEEP APNEA  Have you ever been diagnosed with sleep apnea through a sleep study? No  Do you snore loudly (loud enough to be heard through closed doors)?  0  Do you often feel tired, fatigued, or sleepy during the daytime? 1  Has anyone observed you stop breathing during your sleep? 0  Do you have, or are you being treated for high blood pressure? 1  BMI more than 35 kg/m2? 0  Age over 77 years old? 1  Neck circumference greater than 40 cm/18 inches? 0  Gender: 1  Obstructive Sleep Apnea Score 4  Score 4 or greater  Results sent to PCP

## 2013-08-13 NOTE — Assessment & Plan Note (Signed)
Much better controlled Appreciate dr. Charlean Sanfilippo input

## 2013-08-14 ENCOUNTER — Encounter (HOSPITAL_COMMUNITY): Payer: Self-pay | Admitting: *Deleted

## 2013-08-14 ENCOUNTER — Encounter (HOSPITAL_COMMUNITY)
Admission: RE | Disposition: A | Discharge status: Home / Self Care | Payer: Self-pay | Source: Ambulatory Visit | Attending: Neurosurgery

## 2013-08-14 ENCOUNTER — Ambulatory Visit (HOSPITAL_COMMUNITY)
Admission: RE | Admit: 2013-08-14 | Discharge: 2013-08-14 | Disposition: A | Discharge status: Home / Self Care | Payer: Medicare Other | Source: Ambulatory Visit | Attending: Neurosurgery | Admitting: Neurosurgery

## 2013-08-14 DIAGNOSIS — I10 Essential (primary) hypertension: Secondary | ICD-10-CM | POA: Insufficient documentation

## 2013-08-14 DIAGNOSIS — M48061 Spinal stenosis, lumbar region without neurogenic claudication: Secondary | ICD-10-CM | POA: Insufficient documentation

## 2013-08-14 DIAGNOSIS — Z538 Procedure and treatment not carried out for other reasons: Secondary | ICD-10-CM | POA: Insufficient documentation

## 2013-08-14 DIAGNOSIS — E119 Type 2 diabetes mellitus without complications: Secondary | ICD-10-CM | POA: Insufficient documentation

## 2013-08-14 DIAGNOSIS — I4949 Other premature depolarization: Secondary | ICD-10-CM | POA: Insufficient documentation

## 2013-08-14 LAB — GLUCOSE, CAPILLARY: Glucose-Capillary: 180 mg/dL — ABNORMAL HIGH (ref 70–99)

## 2013-08-14 SURGERY — CANCELLED PROCEDURE

## 2013-08-14 MED ORDER — MUPIROCIN 2 % EX OINT
TOPICAL_OINTMENT | Freq: Two times a day (BID) | CUTANEOUS | Status: DC
  Filled 2013-08-14: qty 22

## 2013-08-14 MED ORDER — MUPIROCIN 2 % EX OINT
TOPICAL_OINTMENT | CUTANEOUS | Status: AC
  Administered 2013-08-14: 10:00:00
  Filled 2013-08-14: qty 22

## 2013-08-14 MED ORDER — LACTATED RINGERS IV SOLN
INTRAVENOUS | Status: DC
  Administered 2013-08-14: 10:00:00 via INTRAVENOUS

## 2013-08-14 SURGICAL SUPPLY — 44 items
APL SKNCLS STERI-STRIP NONHPOA (GAUZE/BANDAGES/DRESSINGS) ×1
BAG DECANTER FOR FLEXI CONT (MISCELLANEOUS) ×3 IMPLANT
BENZOIN TINCTURE PRP APPL 2/3 (GAUZE/BANDAGES/DRESSINGS) ×3 IMPLANT
BLADE SURG ROTATE 9660 (MISCELLANEOUS) IMPLANT
BRUSH SCRUB EZ PLAIN DRY (MISCELLANEOUS) ×3 IMPLANT
CANISTER SUCTION 2500CC (MISCELLANEOUS) ×3 IMPLANT
CLOTH BEACON ORANGE TIMEOUT ST (SAFETY) ×3 IMPLANT
CONT SPEC 4OZ CLIKSEAL STRL BL (MISCELLANEOUS) ×3 IMPLANT
DRAPE LAPAROTOMY 100X72X124 (DRAPES) ×3 IMPLANT
DRAPE MICROSCOPE LEICA (MISCELLANEOUS) ×3 IMPLANT
DRAPE POUCH INSTRU U-SHP 10X18 (DRAPES) ×3 IMPLANT
DRAPE SURG 17X23 STRL (DRAPES) ×12 IMPLANT
ELECT BLADE 4.0 EZ CLEAN MEGAD (MISCELLANEOUS) ×2
ELECT REM PT RETURN 9FT ADLT (ELECTROSURGICAL) ×2
ELECTRODE BLDE 4.0 EZ CLN MEGD (MISCELLANEOUS) ×2 IMPLANT
ELECTRODE REM PT RTRN 9FT ADLT (ELECTROSURGICAL) ×2 IMPLANT
GAUZE SPONGE 4X4 16PLY XRAY LF (GAUZE/BANDAGES/DRESSINGS) IMPLANT
GLOVE BIO SURGEON STRL SZ8.5 (GLOVE) ×3 IMPLANT
GLOVE EXAM NITRILE LRG STRL (GLOVE) IMPLANT
GLOVE EXAM NITRILE MD LF STRL (GLOVE) IMPLANT
GLOVE EXAM NITRILE XL STR (GLOVE) IMPLANT
GLOVE EXAM NITRILE XS STR PU (GLOVE) IMPLANT
GLOVE SS BIOGEL STRL SZ 8 (GLOVE) ×2 IMPLANT
GLOVE SUPERSENSE BIOGEL SZ 8 (GLOVE) ×1
GOWN BRE IMP SLV AUR LG STRL (GOWN DISPOSABLE) IMPLANT
GOWN BRE IMP SLV AUR XL STRL (GOWN DISPOSABLE) ×3 IMPLANT
GOWN STRL REIN 2XL LVL4 (GOWN DISPOSABLE) IMPLANT
KIT BASIN OR (CUSTOM PROCEDURE TRAY) ×3 IMPLANT
KIT ROOM TURNOVER OR (KITS) ×3 IMPLANT
NDL HYPO 21X1.5 SAFETY (NEEDLE) IMPLANT
NEEDLE HYPO 21X1.5 SAFETY (NEEDLE) IMPLANT
NEEDLE HYPO 22GX1.5 SAFETY (NEEDLE) ×3 IMPLANT
NS IRRIG 1000ML POUR BTL (IV SOLUTION) ×3 IMPLANT
PAD ARMBOARD 7.5X6 YLW CONV (MISCELLANEOUS) ×9 IMPLANT
PATTIES SURGICAL .5 X1 (DISPOSABLE) IMPLANT
RUBBERBAND STERILE (MISCELLANEOUS) ×6 IMPLANT
SPONGE GAUZE 4X4 12PLY (GAUZE/BANDAGES/DRESSINGS) ×3 IMPLANT
SPONGE SURGIFOAM ABS GEL SZ50 (HEMOSTASIS) ×3 IMPLANT
STRIP CLOSURE SKIN 1/2X4 (GAUZE/BANDAGES/DRESSINGS) ×3 IMPLANT
SYR 20CC LL (SYRINGE) IMPLANT
SYR 20ML ECCENTRIC (SYRINGE) ×3 IMPLANT
TOWEL OR 17X24 6PK STRL BLUE (TOWEL DISPOSABLE) ×3 IMPLANT
TOWEL OR 17X26 10 PK STRL BLUE (TOWEL DISPOSABLE) ×3 IMPLANT
WATER STERILE IRR 1000ML POUR (IV SOLUTION) ×3 IMPLANT

## 2013-08-14 NOTE — Progress Notes (Signed)
Pt notified of surgery being cancelled and rescheduled for Mon, Sept 8.Pt instructed to arrive at 0700.And that a nurse will call from PreAdmission tomorrow to verify a few pieces of information with him.Pt verbalized understanding of instructions

## 2013-08-14 NOTE — Progress Notes (Signed)
Pt reports that he ate 2 eggs, sausage, bagel and coffee this am at 0545.  Dr Michelle Piper called and informed, states that he will talk to Dr Lovell Sheehan.

## 2013-08-15 NOTE — Progress Notes (Signed)
I spoke with [name] and asked him to tell me when he was going to stop eating before surgery and what time he is going to arrive and what medications he is going to take the day of surgery..  Pt said he was" not going to eat after midnight and that he is going to take with a little water [name] and stomach pill", I asked [name] and he replied "yes".  Pt said that he is going to arrive a 7:00 on Monday.

## 2013-08-18 ENCOUNTER — Encounter (HOSPITAL_COMMUNITY): Payer: Self-pay | Admitting: *Deleted

## 2013-08-18 ENCOUNTER — Encounter (HOSPITAL_COMMUNITY)
Admission: RE | Disposition: A | Discharge status: Home / Self Care | Payer: Self-pay | Source: Ambulatory Visit | Attending: Neurosurgery

## 2013-08-18 ENCOUNTER — Inpatient Hospital Stay (HOSPITAL_COMMUNITY)
Admission: RE | Admit: 2013-08-18 | Discharge: 2013-08-21 | DRG: 491 | Disposition: A | Discharge status: Home / Self Care | Payer: Medicare Other | Source: Ambulatory Visit | Attending: Neurosurgery | Admitting: Neurosurgery

## 2013-08-18 ENCOUNTER — Ambulatory Visit (HOSPITAL_COMMUNITY): Payer: Medicare Other | Admitting: *Deleted

## 2013-08-18 ENCOUNTER — Inpatient Hospital Stay (HOSPITAL_COMMUNITY): Payer: Medicare Other

## 2013-08-18 DIAGNOSIS — M1990 Unspecified osteoarthritis, unspecified site: Secondary | ICD-10-CM | POA: Diagnosis present

## 2013-08-18 DIAGNOSIS — K219 Gastro-esophageal reflux disease without esophagitis: Secondary | ICD-10-CM | POA: Diagnosis present

## 2013-08-18 DIAGNOSIS — Z86718 Personal history of other venous thrombosis and embolism: Secondary | ICD-10-CM

## 2013-08-18 DIAGNOSIS — K589 Irritable bowel syndrome without diarrhea: Secondary | ICD-10-CM | POA: Diagnosis present

## 2013-08-18 DIAGNOSIS — M48062 Spinal stenosis, lumbar region with neurogenic claudication: Principal | ICD-10-CM | POA: Diagnosis present

## 2013-08-18 DIAGNOSIS — Z96659 Presence of unspecified artificial knee joint: Secondary | ICD-10-CM

## 2013-08-18 DIAGNOSIS — E119 Type 2 diabetes mellitus without complications: Secondary | ICD-10-CM | POA: Diagnosis present

## 2013-08-18 DIAGNOSIS — E785 Hyperlipidemia, unspecified: Secondary | ICD-10-CM | POA: Diagnosis present

## 2013-08-18 DIAGNOSIS — N393 Stress incontinence (female) (male): Secondary | ICD-10-CM | POA: Diagnosis present

## 2013-08-18 DIAGNOSIS — N318 Other neuromuscular dysfunction of bladder: Secondary | ICD-10-CM | POA: Diagnosis present

## 2013-08-18 DIAGNOSIS — I10 Essential (primary) hypertension: Secondary | ICD-10-CM | POA: Diagnosis present

## 2013-08-18 DIAGNOSIS — I959 Hypotension, unspecified: Secondary | ICD-10-CM | POA: Diagnosis not present

## 2013-08-18 HISTORY — PX: LAMINECTOMY: SHX219

## 2013-08-18 HISTORY — PX: LUMBAR LAMINECTOMY/DECOMPRESSION MICRODISCECTOMY: SHX5026

## 2013-08-18 LAB — GLUCOSE, CAPILLARY
Glucose-Capillary: 153 mg/dL — ABNORMAL HIGH (ref 70–99)
Glucose-Capillary: 136 mg/dL — ABNORMAL HIGH (ref 70–99)

## 2013-08-18 SURGERY — LUMBAR LAMINECTOMY/DECOMPRESSION MICRODISCECTOMY 3 LEVELS
Anesthesia: General | Site: Back | Wound class: Clean

## 2013-08-18 MED ORDER — ACETAMINOPHEN 650 MG RE SUPP: 650.0000 mg | RECTAL | Status: DC | PRN

## 2013-08-18 MED ORDER — MUPIROCIN 2 % EX OINT
TOPICAL_OINTMENT | CUTANEOUS | Status: AC
  Filled 2013-08-18: qty 22

## 2013-08-18 MED ORDER — MENTHOL 3 MG MT LOZG
1.0000 | LOZENGE | OROMUCOSAL | Status: DC | PRN
  Filled 2013-08-18: qty 9

## 2013-08-18 MED ORDER — DIAZEPAM 5 MG PO TABS
5.0000 mg | ORAL_TABLET | Freq: Four times a day (QID) | ORAL | Status: DC | PRN
  Administered 2013-08-18: 5 mg via ORAL
  Filled 2013-08-18: qty 1

## 2013-08-18 MED ORDER — LACTATED RINGERS IV SOLN: INTRAVENOUS | Status: DC

## 2013-08-18 MED ORDER — LACTATED RINGERS IV SOLN
INTRAVENOUS | Status: DC
  Administered 2013-08-18: 50 mL/h via INTRAVENOUS

## 2013-08-18 MED ORDER — HYDROMORPHONE HCL PF 1 MG/ML IJ SOLN: 0.2500 mg | INTRAMUSCULAR | Status: DC | PRN

## 2013-08-18 MED ORDER — BACITRACIN ZINC 500 UNIT/GM EX OINT
TOPICAL_OINTMENT | CUTANEOUS | Status: DC | PRN
  Administered 2013-08-18: 1 via TOPICAL

## 2013-08-18 MED ORDER — OXYCODONE-ACETAMINOPHEN 5-325 MG PO TABS
1.0000 | ORAL_TABLET | ORAL | Status: DC | PRN
  Administered 2013-08-18 – 2013-08-19 (×4): 2 via ORAL
  Administered 2013-08-19: 1 via ORAL
  Administered 2013-08-20 – 2013-08-21 (×5): 2 via ORAL
  Filled 2013-08-18 (×11): qty 2

## 2013-08-18 MED ORDER — SIMVASTATIN 20 MG PO TABS
20.0000 mg | ORAL_TABLET | Freq: Every day | ORAL | Status: DC
  Administered 2013-08-18 – 2013-08-20 (×3): 20 mg via ORAL
  Filled 2013-08-18 (×4): qty 1

## 2013-08-18 MED ORDER — VITAMIN D-3 25 MCG (1000 UT) PO CAPS: 1.0000 | ORAL_CAPSULE | Freq: Every day | ORAL | Status: DC

## 2013-08-18 MED ORDER — DOXAZOSIN MESYLATE 2 MG PO TABS
2.0000 mg | ORAL_TABLET | Freq: Every day | ORAL | Status: DC
  Administered 2013-08-18 – 2013-08-20 (×3): 2 mg via ORAL
  Filled 2013-08-18 (×5): qty 1

## 2013-08-18 MED ORDER — ROCURONIUM BROMIDE 100 MG/10ML IV SOLN
INTRAVENOUS | Status: DC | PRN
  Administered 2013-08-18: 50 mg via INTRAVENOUS
  Administered 2013-08-18: 20 mg via INTRAVENOUS

## 2013-08-18 MED ORDER — ONDANSETRON HCL 4 MG/2ML IJ SOLN: 4.0000 mg | Freq: Once | INTRAMUSCULAR | Status: DC | PRN

## 2013-08-18 MED ORDER — PROPOFOL 10 MG/ML IV BOLUS
INTRAVENOUS | Status: DC | PRN
  Administered 2013-08-18: 120 mg via INTRAVENOUS

## 2013-08-18 MED ORDER — ACETAMINOPHEN 325 MG PO TABS: 650.0000 mg | ORAL_TABLET | ORAL | Status: DC | PRN

## 2013-08-18 MED ORDER — OXYCODONE HCL 5 MG PO TABS: 5.0000 mg | ORAL_TABLET | Freq: Once | ORAL | Status: DC | PRN

## 2013-08-18 MED ORDER — SODIUM CHLORIDE 0.9 % IR SOLN
Status: DC | PRN
  Administered 2013-08-18: 11:00:00

## 2013-08-18 MED ORDER — BUPIVACAINE-EPINEPHRINE PF 0.5-1:200000 % IJ SOLN
INTRAMUSCULAR | Status: DC | PRN
  Administered 2013-08-18: 10 mL

## 2013-08-18 MED ORDER — MORPHINE SULFATE 2 MG/ML IJ SOLN
1.0000 mg | INTRAMUSCULAR | Status: DC | PRN
  Administered 2013-08-19: 2 mg via INTRAVENOUS
  Filled 2013-08-18: qty 1

## 2013-08-18 MED ORDER — VITAMIN D3 25 MCG (1000 UNIT) PO TABS
1000.0000 [IU] | ORAL_TABLET | Freq: Every day | ORAL | Status: DC
  Administered 2013-08-18 – 2013-08-21 (×4): 1000 [IU] via ORAL
  Filled 2013-08-18 (×4): qty 1

## 2013-08-18 MED ORDER — BUPIVACAINE LIPOSOME 1.3 % IJ SUSP
20.0000 mL | INTRAMUSCULAR | Status: DC
  Filled 2013-08-18: qty 20

## 2013-08-18 MED ORDER — CEFAZOLIN SODIUM-DEXTROSE 2-3 GM-% IV SOLR
2.0000 g | INTRAVENOUS | Status: AC
  Administered 2013-08-18: 2 g via INTRAVENOUS
  Filled 2013-08-18 (×2): qty 50

## 2013-08-18 MED ORDER — PHENOL 1.4 % MT LIQD: 1.0000 | OROMUCOSAL | Status: DC | PRN

## 2013-08-18 MED ORDER — OXYCODONE HCL 5 MG/5ML PO SOLN: 5.0000 mg | Freq: Once | ORAL | Status: DC | PRN

## 2013-08-18 MED ORDER — THROMBIN 20000 UNITS EX SOLR
CUTANEOUS | Status: DC | PRN
  Administered 2013-08-18: 11:00:00 via TOPICAL

## 2013-08-18 MED ORDER — ALBUMIN HUMAN 5 % IV SOLN
INTRAVENOUS | Status: DC | PRN
  Administered 2013-08-18: 13:00:00 via INTRAVENOUS

## 2013-08-18 MED ORDER — METFORMIN HCL 500 MG PO TABS
1000.0000 mg | ORAL_TABLET | Freq: Two times a day (BID) | ORAL | Status: DC
  Administered 2013-08-18 – 2013-08-21 (×6): 1000 mg via ORAL
  Filled 2013-08-18 (×8): qty 2

## 2013-08-18 MED ORDER — LINAGLIPTIN 5 MG PO TABS
5.0000 mg | ORAL_TABLET | Freq: Every day | ORAL | Status: DC
  Administered 2013-08-20 – 2013-08-21 (×2): 5 mg via ORAL
  Filled 2013-08-18 (×4): qty 1

## 2013-08-18 MED ORDER — AMLODIPINE BESYLATE 2.5 MG PO TABS
2.5000 mg | ORAL_TABLET | Freq: Every day | ORAL | Status: DC
  Administered 2013-08-21: 2.5 mg via ORAL
  Filled 2013-08-18 (×3): qty 1

## 2013-08-18 MED ORDER — NAPROXEN 250 MG PO TABS
250.0000 mg | ORAL_TABLET | Freq: Two times a day (BID) | ORAL | Status: DC
  Administered 2013-08-18 – 2013-08-21 (×6): 250 mg via ORAL
  Filled 2013-08-18 (×8): qty 1

## 2013-08-18 MED ORDER — ONE-DAILY MULTI VITAMINS PO TABS: 1.0000 | ORAL_TABLET | Freq: Every day | ORAL | Status: DC

## 2013-08-18 MED ORDER — HYDROCODONE-ACETAMINOPHEN 5-325 MG PO TABS: 1.0000 | ORAL_TABLET | ORAL | Status: DC | PRN

## 2013-08-18 MED ORDER — ALUM & MAG HYDROXIDE-SIMETH 200-200-20 MG/5ML PO SUSP: 30.0000 mL | Freq: Four times a day (QID) | ORAL | Status: DC | PRN

## 2013-08-18 MED ORDER — FESOTERODINE FUMARATE ER 4 MG PO TB24
4.0000 mg | ORAL_TABLET | Freq: Every day | ORAL | Status: DC
  Administered 2013-08-19 – 2013-08-21 (×3): 4 mg via ORAL
  Filled 2013-08-18 (×3): qty 1

## 2013-08-18 MED ORDER — ONDANSETRON HCL 4 MG/2ML IJ SOLN
4.0000 mg | INTRAMUSCULAR | Status: DC | PRN
  Administered 2013-08-19: 4 mg via INTRAVENOUS
  Filled 2013-08-18: qty 2

## 2013-08-18 MED ORDER — NEOSTIGMINE METHYLSULFATE 1 MG/ML IJ SOLN
INTRAMUSCULAR | Status: DC | PRN
  Administered 2013-08-18: 4 mg via INTRAVENOUS

## 2013-08-18 MED ORDER — CEFAZOLIN SODIUM-DEXTROSE 2-3 GM-% IV SOLR
2.0000 g | Freq: Three times a day (TID) | INTRAVENOUS | Status: AC
  Administered 2013-08-18 – 2013-08-19 (×2): 2 g via INTRAVENOUS
  Filled 2013-08-18 (×2): qty 50

## 2013-08-18 MED ORDER — ADULT MULTIVITAMIN W/MINERALS CH
1.0000 | ORAL_TABLET | Freq: Every day | ORAL | Status: DC
  Administered 2013-08-19 – 2013-08-21 (×3): 1 via ORAL
  Filled 2013-08-18 (×3): qty 1

## 2013-08-18 MED ORDER — PHENYLEPHRINE HCL 10 MG/ML IJ SOLN
INTRAMUSCULAR | Status: DC | PRN
  Administered 2013-08-18 (×2): 80 ug via INTRAVENOUS

## 2013-08-18 MED ORDER — DOCUSATE SODIUM 100 MG PO CAPS
100.0000 mg | ORAL_CAPSULE | Freq: Two times a day (BID) | ORAL | Status: DC
  Administered 2013-08-18 – 2013-08-20 (×5): 100 mg via ORAL
  Filled 2013-08-18 (×5): qty 1

## 2013-08-18 MED ORDER — EPHEDRINE SULFATE 50 MG/ML IJ SOLN
INTRAMUSCULAR | Status: DC | PRN
  Administered 2013-08-18: 5 mg via INTRAVENOUS
  Administered 2013-08-18 (×2): 10 mg via INTRAVENOUS
  Administered 2013-08-18 (×2): 5 mg via INTRAVENOUS
  Administered 2013-08-18: 10 mg via INTRAVENOUS
  Administered 2013-08-18: 5 mg via INTRAVENOUS

## 2013-08-18 MED ORDER — ONDANSETRON HCL 4 MG/2ML IJ SOLN
INTRAMUSCULAR | Status: DC | PRN
  Administered 2013-08-18: 4 mg via INTRAVENOUS

## 2013-08-18 MED ORDER — MIDAZOLAM HCL 5 MG/5ML IJ SOLN
INTRAMUSCULAR | Status: DC | PRN
  Administered 2013-08-18 (×2): 1 mg via INTRAVENOUS

## 2013-08-18 MED ORDER — PANTOPRAZOLE SODIUM 40 MG PO TBEC
40.0000 mg | DELAYED_RELEASE_TABLET | Freq: Every day | ORAL | Status: DC
  Administered 2013-08-19 – 2013-08-21 (×3): 40 mg via ORAL
  Filled 2013-08-18 (×3): qty 1

## 2013-08-18 MED ORDER — NAPROXEN SODIUM 220 MG PO CAPS: 1.0000 | ORAL_CAPSULE | Freq: Two times a day (BID) | ORAL | Status: DC

## 2013-08-18 MED ORDER — BUPIVACAINE LIPOSOME 1.3 % IJ SUSP
INTRAMUSCULAR | Status: DC | PRN
  Administered 2013-08-18: 20 mL

## 2013-08-18 MED ORDER — GLYCOPYRROLATE 0.2 MG/ML IJ SOLN
INTRAMUSCULAR | Status: DC | PRN
  Administered 2013-08-18: 0.6 mg via INTRAVENOUS
  Administered 2013-08-18: 0.1 mg via INTRAVENOUS

## 2013-08-18 MED ORDER — LACTATED RINGERS IV SOLN
INTRAVENOUS | Status: DC | PRN
  Administered 2013-08-18 (×2): via INTRAVENOUS

## 2013-08-18 MED ORDER — FENTANYL CITRATE 0.05 MG/ML IJ SOLN
INTRAMUSCULAR | Status: DC | PRN
  Administered 2013-08-18: 150 ug via INTRAVENOUS
  Administered 2013-08-18: 100 ug via INTRAVENOUS

## 2013-08-18 MED ORDER — 0.9 % SODIUM CHLORIDE (POUR BTL) OPTIME
TOPICAL | Status: DC | PRN
  Administered 2013-08-18: 1000 mL

## 2013-08-18 SURGICAL SUPPLY — 61 items
APL SKNCLS STERI-STRIP NONHPOA (GAUZE/BANDAGES/DRESSINGS) ×1
BAG DECANTER FOR FLEXI CONT (MISCELLANEOUS) ×2 IMPLANT
BENZOIN TINCTURE PRP APPL 2/3 (GAUZE/BANDAGES/DRESSINGS) ×2 IMPLANT
BLADE SURG ROTATE 9660 (MISCELLANEOUS) ×1 IMPLANT
BRUSH SCRUB EZ PLAIN DRY (MISCELLANEOUS) ×2 IMPLANT
BUR ACORN 6.0 (BURR) ×2 IMPLANT
BUR MATCHSTICK NEURO 3.0 LAGG (BURR) ×2 IMPLANT
CANISTER SUCTION 2500CC (MISCELLANEOUS) ×2 IMPLANT
CLOTH BEACON ORANGE TIMEOUT ST (SAFETY) ×2 IMPLANT
CONT SPEC 4OZ CLIKSEAL STRL BL (MISCELLANEOUS) ×2 IMPLANT
DRAPE LAPAROTOMY 100X72X124 (DRAPES) ×2 IMPLANT
DRAPE MICROSCOPE LEICA (MISCELLANEOUS) ×2 IMPLANT
DRAPE POUCH INSTRU U-SHP 10X18 (DRAPES) ×2 IMPLANT
DRAPE SURG 17X23 STRL (DRAPES) ×8 IMPLANT
ELECT BLADE 4.0 EZ CLEAN MEGAD (MISCELLANEOUS) ×2
ELECT REM PT RETURN 9FT ADLT (ELECTROSURGICAL) ×2
ELECTRODE BLDE 4.0 EZ CLN MEGD (MISCELLANEOUS) ×1 IMPLANT
ELECTRODE REM PT RTRN 9FT ADLT (ELECTROSURGICAL) ×1 IMPLANT
EVACUATOR 1/8 PVC DRAIN (DRAIN) ×1 IMPLANT
GAUZE SPONGE 4X4 16PLY XRAY LF (GAUZE/BANDAGES/DRESSINGS) ×1 IMPLANT
GLOVE BIO SURGEON STRL SZ8.5 (GLOVE) ×2 IMPLANT
GLOVE BIOGEL PI IND STRL 7.0 (GLOVE) IMPLANT
GLOVE BIOGEL PI IND STRL 7.5 (GLOVE) IMPLANT
GLOVE BIOGEL PI IND STRL 8 (GLOVE) IMPLANT
GLOVE BIOGEL PI INDICATOR 7.0 (GLOVE) ×2
GLOVE BIOGEL PI INDICATOR 7.5 (GLOVE) ×1
GLOVE BIOGEL PI INDICATOR 8 (GLOVE) ×1
GLOVE ECLIPSE 7.5 STRL STRAW (GLOVE) ×2 IMPLANT
GLOVE EXAM NITRILE LRG STRL (GLOVE) IMPLANT
GLOVE EXAM NITRILE MD LF STRL (GLOVE) ×1 IMPLANT
GLOVE EXAM NITRILE XL STR (GLOVE) IMPLANT
GLOVE EXAM NITRILE XS STR PU (GLOVE) IMPLANT
GLOVE SS BIOGEL STRL SZ 8 (GLOVE) ×1 IMPLANT
GLOVE SUPERSENSE BIOGEL SZ 8 (GLOVE) ×1
GLOVE SURG SS PI 7.0 STRL IVOR (GLOVE) ×2 IMPLANT
GOWN BRE IMP SLV AUR LG STRL (GOWN DISPOSABLE) ×2 IMPLANT
GOWN BRE IMP SLV AUR XL STRL (GOWN DISPOSABLE) ×3 IMPLANT
GOWN STRL REIN 2XL LVL4 (GOWN DISPOSABLE) IMPLANT
KIT BASIN OR (CUSTOM PROCEDURE TRAY) ×2 IMPLANT
KIT ROOM TURNOVER OR (KITS) ×2 IMPLANT
NDL HYPO 21X1.5 SAFETY (NEEDLE) IMPLANT
NEEDLE HYPO 21X1.5 SAFETY (NEEDLE) ×2 IMPLANT
NEEDLE HYPO 22GX1.5 SAFETY (NEEDLE) ×2 IMPLANT
NS IRRIG 1000ML POUR BTL (IV SOLUTION) ×2 IMPLANT
PACK LAMINECTOMY NEURO (CUSTOM PROCEDURE TRAY) ×2 IMPLANT
PAD ARMBOARD 7.5X6 YLW CONV (MISCELLANEOUS) ×6 IMPLANT
PATTIES SURGICAL .5 X1 (DISPOSABLE) IMPLANT
RUBBERBAND STERILE (MISCELLANEOUS) ×4 IMPLANT
SPONGE GAUZE 4X4 12PLY (GAUZE/BANDAGES/DRESSINGS) ×2 IMPLANT
SPONGE SURGIFOAM ABS GEL 100 (HEMOSTASIS) ×1 IMPLANT
SPONGE SURGIFOAM ABS GEL SZ50 (HEMOSTASIS) ×1 IMPLANT
STRIP CLOSURE SKIN 1/2X4 (GAUZE/BANDAGES/DRESSINGS) ×2 IMPLANT
SUT VIC AB 1 CT1 18XBRD ANBCTR (SUTURE) ×2 IMPLANT
SUT VIC AB 1 CT1 8-18 (SUTURE) ×4
SUT VIC AB 2-0 CP2 18 (SUTURE) ×4 IMPLANT
SYR 20CC LL (SYRINGE) ×1 IMPLANT
SYR 20ML ECCENTRIC (SYRINGE) ×2 IMPLANT
TAPE CLOTH SURG 4X10 WHT LF (GAUZE/BANDAGES/DRESSINGS) ×1 IMPLANT
TOWEL OR 17X24 6PK STRL BLUE (TOWEL DISPOSABLE) ×2 IMPLANT
TOWEL OR 17X26 10 PK STRL BLUE (TOWEL DISPOSABLE) ×2 IMPLANT
WATER STERILE IRR 1000ML POUR (IV SOLUTION) ×2 IMPLANT

## 2013-08-18 NOTE — Progress Notes (Signed)
Patient arrived on floor via PACU RN, Bjorn Loser.  Patient alert and oriented x 4 and has already voided 300 ml clear, yellow, urine.  Patient oriented to unit and made comfortable.  Will continue to monitor.  Lance Bosch, RN

## 2013-08-18 NOTE — H&P (Signed)
Subjective: The patient is a 79 year old white male who has complained of back and leg pain. He has failed medical management and was worked up with a lumbar MRI. This demonstrated spinal stenosis at L2-3, L3-4 and L4-5. I discussed the various treatment options with the patient including surgery. He has weighed the risks, benefits, and alternatives surgery and decided proceed with a decompressive lumbar laminectomy.   Past Medical History  Diagnosis Date  . Diabetes mellitus     type 2  . Diverticulosis of colon   . GERD (gastroesophageal reflux disease)   . Osteoarthritis   . Stress incontinence, male     urge  . Dyslipidemia   . Hernia, hiatal   . Hyperlipidemia   . Hypertension   . DVT (deep venous thrombosis)   . Complication of anesthesia   . PONV (postoperative nausea and vomiting)   . Family history of anesthesia complication     Hx: pt father had " memeory loss"  . Shortness of breath     Hx: of with exertion  . Overactive bladder     Hx: of  . IBS (irritable bowel syndrome)     Hx: of  . Pneumonia 2002    history of    Past Surgical History  Procedure Laterality Date  . Wrist tendon surgery    . Cts bilateral    . Knee arthroscopy  1988, 1992    bilateral  . Total knee arthroplasty  2005, 2007  . Lumbar left arm  1961  . Tonsillectomy    . Back surgery    . Colonoscopy      Hx: of    Allergies  Allergen Reactions  . Ciprofloxacin Other (See Comments)    "made me have a weird feeling. Disoriented."   . Sulfamethoxazole     REACTION: rash    History  Substance Use Topics  . Smoking status: Never Smoker   . Smokeless tobacco: Never Used  . Alcohol Use: No    Family History  Problem Relation Age of Onset  . Other Mother 42    CABG  . Heart disease Mother   . Hypertension Mother   . Hyperlipidemia Son    Prior to Admission medications   Medication Sig Start Date End Date Taking? Authorizing Provider  amLODipine (NORVASC) 5 MG tablet Take 2.5 mg  by mouth daily.   Yes Historical Provider, MD  Cholecalciferol (VITAMIN D-3) 1000 UNITS CAPS Take 1 capsule by mouth daily.   Yes Historical Provider, MD  doxazosin (CARDURA) 4 MG tablet Take 2 mg by mouth at bedtime.   Yes Historical Provider, MD  fesoterodine (TOVIAZ) 4 MG TB24 tablet Take 4 mg by mouth daily.   Yes Historical Provider, MD  metFORMIN (GLUCOPHAGE) 1000 MG tablet Take 1,000 mg by mouth 2 (two) times daily with a meal.   Yes Historical Provider, MD  Multiple Vitamin (MULTIVITAMIN) tablet Take 1 tablet by mouth daily.   Yes Historical Provider, MD  Naproxen Sodium (ALEVE) 220 MG CAPS Take 1 capsule by mouth 2 (two) times daily.   Yes Historical Provider, MD  omeprazole (PRILOSEC) 20 MG capsule Take 20 mg by mouth daily.   Yes Historical Provider, MD  simvastatin (ZOCOR) 20 MG tablet Take 20 mg by mouth every evening.   Yes Historical Provider, MD  sitaGLIPtin (JANUVIA) 100 MG tablet Take 100 mg by mouth daily.   Yes Historical Provider, MD  aspirin 81 MG tablet Take 81 mg by mouth daily.  Historical Provider, MD  b complex vitamins tablet Take 1 tablet by mouth daily.    Historical Provider, MD  Chromium-Cinnamon [phone] MCG-MG CAPS Take 1 capsule by mouth daily.    Historical Provider, MD  glucose blood test strip 1 each by Other route 3 (three) times daily as needed. Use as instructed 01/17/13   Terressa Koyanagi, DO  [facility] DELICA LANCETS 33G MISC 1 each daily. 01/17/13   Historical Provider, MD     Review of Systems  Positive ROS: As above  All other systems have been reviewed and were otherwise negative with the exception of those mentioned in the HPI and as above.  Objective: Vital signs in last 24 hours: Temp:  [97.2 F (36.2 C)] 97.2 F (36.2 C) (09/08 0703) Pulse Rate:  [63] 63 (09/08 0703) Resp:  [20] 20 (09/08 0703) BP: (116)/(52) 116/52 mmHg (09/08 0703) SpO2:  [98 %] 98 % (09/08 0703)  General Appearance: Alert, cooperative, no distress, appears stated  age Head: Normocephalic, without obvious abnormality, atraumatic Eyes: PERRL, conjunctiva/corneas clear, EOM's intact, fundi benign, both eyes      Ears: Normal TM's and external ear canals, both ears Throat: Lips, mucosa, and tongue normal; teeth and gums normal Neck: Supple, symmetrical, trachea midline, no adenopathy; thyroid: No enlargement/tenderness/nodules; no carotid bruit or JVD Back: Symmetric, no curvature, ROM normal, no CVA tenderness Lungs: Clear to auscultation bilaterally, respirations unlabored Heart: Regular rate and rhythm, S1 and S2 normal, no murmur, rub or gallop Abdomen: Soft, non-tender, bowel sounds active all four quadrants, no masses, no organomegaly Extremities: Extremities normal, atraumatic, no cyanosis or edema Pulses: 2+ and symmetric all extremities Skin: Skin color, texture, turgor normal, no rashes or lesions  NEUROLOGIC:   Mental status: alert and oriented, no aphasia, good attention span, Fund of knowledge/ memory ok Motor Exam - grossly normal Sensory Exam - grossly normal Reflexes:  Coordination - grossly normal Gait - grossly normal Balance - grossly normal Cranial Nerves: I: smell Not tested  II: visual acuity  OS: Normal    OD: Normal   II: visual fields Full to confrontation  II: pupils Equal, round, reactive to light  III,VII: ptosis None  III,IV,VI: extraocular muscles  Full ROM  V: mastication Normal  V: facial light touch sensation  Normal  V,VII: corneal reflex  Present  VII: facial muscle function - upper  Normal  VII: facial muscle function - lower Normal  VIII: hearing Not tested  IX: soft palate elevation  Normal  IX,X: gag reflex Present  XI: trapezius strength  5/5  XI: sternocleidomastoid strength 5/5  XI: neck flexion strength  5/5  XII: tongue strength  Normal    Data Review Lab Results  Component Value Date   WBC 5.9 08/06/2013   HGB 12.7* 08/06/2013   HCT 37.4* 08/06/2013   MCV 88.9 08/06/2013   PLT 141.0*  08/06/2013   Lab Results  Component Value Date   NA 135 08/06/2013   K 4.4 08/06/2013   CL 98 08/06/2013   CO2 29 08/06/2013   BUN 17 08/06/2013   CREATININE 0.7 08/06/2013   GLUCOSE 145* 08/06/2013   No results found for this basename: INR, PROTIME    Assessment/Plan: L2-3, L3-4 and L4-5 spinal stenosis, lumbago, lumbar radiculopathy, neurogenic claudication: I discussed the situation with the patient. I reviewed his MR scan with them and pointed out the abnormalities. We have discussed the various treatment options including surgery. I have described the surgical treatment option of an L2-3,  L3-4 and L4-5 laminectomy. I have described the surgery to them. I've shown surgical models. We have discussed the risks, benefits, alternatives, and likelihood of achieving our goals with surgery. I've answered all the patient's questions. He has decided to proceed with surgery.   Ane Conerly D 08/18/2013 9:40 AM

## 2013-08-18 NOTE — Anesthesia Preprocedure Evaluation (Signed)
Anesthesia Evaluation  Patient identified by MRN, date of birth, ID band Patient awake    Reviewed: Allergy & Precautions, H&P , NPO status , Patient's Chart, lab work & pertinent test results  Airway Mallampati: II      Dental  (+) Teeth Intact and Dental Advisory Given   Pulmonary  breath sounds clear to auscultation        Cardiovascular Rhythm:Regular Rate:Normal     Neuro/Psych    GI/Hepatic   Endo/Other    Renal/GU      Musculoskeletal   Abdominal   Peds  Hematology   Anesthesia Other Findings   Reproductive/Obstetrics                           Anesthesia Physical Anesthesia Plan  ASA: III  Anesthesia Plan: General   Post-op Pain Management:    Induction: Intravenous  Airway Management Planned: Oral ETT  Additional Equipment:   Intra-op Plan:   Post-operative Plan: Extubation in OR  Informed Consent: I have reviewed the patients History and Physical, chart, labs and discussed the procedure including the risks, benefits and alternatives for the proposed anesthesia with the patient or authorized representative who has indicated his/her understanding and acceptance.   Dental advisory given  Plan Discussed with: CRNA and Anesthesiologist  Anesthesia Plan Comments: (Lumbar spinal stenosis Type 2 DM glucose 153 htn GERD H/O PONV  Plan GA with oral ETT  Kipp Brood, MD)        Anesthesia Quick Evaluation

## 2013-08-18 NOTE — Preoperative (Signed)
Beta Blockers   Reason not to administer Beta Blockers:Not Applicable 

## 2013-08-18 NOTE — Progress Notes (Signed)
Patient walked in the hallway with the RN this evening.  He ambulated approximately 200-321ft.  He tolerated well.  He has voided and eaten dinner and is currently sitting in the chair.  Will continue to monitor.  Lance Bosch, RN

## 2013-08-18 NOTE — Op Note (Signed)
Brief history: The patient is a 80 year old white male who has complained of back and leg pain consistent with neurogenic claudication. He has failed medical management and was worked up with a lumbar MRI. This demonstrated multilevel spondylosis and stenosis. I discussed the various treatment option with the patient including surgery. He has weighed the risks, benefits, and alternatives surgery and decided proceed with a L2-3, L3-4 and L4-5 decompressive laminectomy.  Preoperative diagnosis: L2-3, L3-4 and L4-5 disc degeneration, spondylosis, stenosis, lumbar radiculopathy, lumbago, neurogenic claudication  Postoperative diagnosis: The same  Procedure: L2, L3 and L4 laminectomy to decompress the bilateral L3, L4, L5 nerve roots using microdissection  Surgeon: Dr. Delma Officer  Asst.: Dr. Shirlean Kelly  Anesthesia: Gen. endotracheal  Estimated blood loss: 125 cc  Drains: One medium Hemovac drain in the epidural space  Complications: None  Description of procedure: The patient was brought to the operating room by the anesthesia team. General endotracheal anesthesia was induced. The patient was turned to the prone position on the Wilson frame. The patient's lumbosacral region was then prepared with Betadine scrub and Betadine solution. Sterile drapes were applied.  I then injected the area to be incised with Marcaine with epinephrine solution. I then used a scalpel to make a linear midline incision over the L2-3, L3-4 and L4-5 intervertebral disc space. I then used electrocautery to perform a bilateral subperiosteal dissection exposing the spinous process and lamina of L2-L5. We obtained intraoperative radiograph to confirm our location. I then inserted the [facility] retractor for exposure. I began the decompression by incising the interspinous ligament at L1-2, L2-3, L3-4, L4-5. I then used Leksell nodule were to remove the spinous process of L2, L3 and L4.  We then brought the operative  microscope into the field. Under its magnification and illumination we completed the microdissection. I used a high-speed drill to perform bilateral laminotomies at L2, L3, and L4. I then used a Kerrison punches to complete the laminectomy at L3 and L4 and a partial laminectomy at L 2 and to remove the ligamentum flavum at L2-3, L3-4 and L4-5. I also used a drill and the Kerrison punch to remove the cephalad aspect of the L5 lamina further decompressing the thecal sac We then used microdissection to free up the thecal sac and the bilateral L3, L4 and L5 nerve root from the epidural tissue. I then used a Kerrison punch to perform a foraminotomy at about the bilateral L3, L4 and L5 nerve root. We then using the nerve root retractor to gently retract the thecal sac and the individual nerve root medially. This exposed the intervertebral disc sequentially at L2-3, L3-4 and L4-5. We did not identify any significant ruptured disc and therefore did not perform intervertebral discectomy.  I then palpated along the ventral surface of the thecal sac and along exit route of the bilateral L3, L4 and L5 nerve root and noted that the neural structures were well decompressed. This completed the decompression.  We then obtained hemostasis using bipolar electrocautery. We irrigated the wound out with bacitracin solution. We then removed the retractor. I placed a medium Hemovac drain in the epidural space and tunneled out through separate stab wound. We then reapproximated the patient's thoracolumbar fascia with interrupted #1 Vicryl suture. We then reapproximated the patient's subcutaneous tissue with interrupted 3-0 Vicryl suture. We then reapproximated patient's skin with Steri-Strips and benzoin. The was then coated with bacitracin ointment. The drapes were removed. The patient was subsequently returned to the supine position where they were  extubated by the anesthesia team. The patient was then transported to the  postanesthesia care unit in stable condition. All sponge instrument and needle counts were reportedly correct at the end of this case.

## 2013-08-18 NOTE — Anesthesia Postprocedure Evaluation (Signed)
  Anesthesia Post-op Note  Patient: [name]  Procedure(s) Performed: Procedure(s): Lumbar two-three, lumbar three-four, lumbar four-five decompressive laminectomies (N/A)  Patient Location: PACU  Anesthesia Type:General  Level of Consciousness: awake, alert  and oriented  Airway and Oxygen Therapy: Patient Spontanous Breathing and Patient connected to nasal cannula oxygen  Post-op Pain: mild  Post-op Assessment: Post-op Vital signs reviewed, Patient's Cardiovascular Status Stable, Respiratory Function Stable, Patent Airway and Pain level controlled  Post-op Vital Signs: stable  Complications: No apparent anesthesia complications

## 2013-08-18 NOTE — Transfer of Care (Signed)
Immediate Anesthesia Transfer of Care Note  Patient: [name]  Procedure(s) Performed: Procedure(s): Lumbar two-three, lumbar three-four, lumbar four-five decompressive laminectomies (N/A)  Patient Location: PACU  Anesthesia Type:General  Level of Consciousness: responds to stimulation  Airway & Oxygen Therapy: Patient Spontanous Breathing and Patient connected to nasal cannula oxygen  Post-op Assessment: Report given to PACU RN and Post -op Vital signs reviewed and stable  Post vital signs: Reviewed and stable  Complications: No apparent anesthesia complications

## 2013-08-19 ENCOUNTER — Encounter (HOSPITAL_COMMUNITY): Payer: Self-pay | Admitting: Neurosurgery

## 2013-08-19 LAB — GLUCOSE, CAPILLARY
Glucose-Capillary: 240 mg/dL — ABNORMAL HIGH (ref 70–99)
Glucose-Capillary: 228 mg/dL — ABNORMAL HIGH (ref 70–99)

## 2013-08-19 MED ORDER — GLUCERNA SHAKE PO LIQD
237.0000 mL | ORAL | Status: DC
  Administered 2013-08-19 – 2013-08-20 (×2): 237 mL via ORAL

## 2013-08-19 MED ORDER — MECLIZINE HCL 25 MG PO TABS
25.0000 mg | ORAL_TABLET | Freq: Four times a day (QID) | ORAL | Status: DC | PRN
  Administered 2013-08-19: 25 mg via ORAL
  Filled 2013-08-19 (×3): qty 1

## 2013-08-19 NOTE — Progress Notes (Signed)
Patient ID: [name], male   DOB: [birth year], 79 y.o.   MRN: [id number] Subjective:  The patient is alert and pleasant. He looks well. He had an episode of hypotension upon arising this morning.  Objective: Vital signs in last 24 hours: Temp:  [97 F (36.1 C)-98.1 F (36.7 C)] 97.5 F (36.4 C) (09/09 0700) Pulse Rate:  [54-68] 61 (09/09 0700) Resp:  [12-20] 18 (09/09 0700) BP: (95-134)/(43-68) 120/53 mmHg (09/09 0700) SpO2:  [98 %-100 %] 100 % (09/09 0700) Weight:  [75.75 kg (167 lb)] 75.75 kg (167 lb) (09/08 2131)  Intake/Output from previous day: 09/08 0701 - 09/09 0700 In: 1450 [I.V.:1200; IV Piggyback:250] Out: 1905 [Urine:1225; Drains:380; Blood:300] Intake/Output this shift:    Physical exam patient is alert and oriented. His dressing is clean and dry. He is moving his lower extremities well.  Lab Results: No results found for this basename: WBC, HGB, HCT, PLT,  in the last 72 hours BMET No results found for this basename: NA, K, CL, CO2, GLUCOSE, BUN, CREATININE, CALCIUM,  in the last 72 hours  Studies/Results: Dg Lumbar Spine 1 View  08/18/2013   CLINICAL DATA:  79 year old male undergoing lumbar surgery.  EXAM: LUMBAR SPINE - 1 VIEW  COMPARISON:  Lumbar MRI 06/05/2013.  FINDINGS: Portable cross-table lateral intraoperative view of the lumbar spine at 1101 hrs. Same numbering system as on the comparison. Surgical probe directed toward the L3-L4 disc space.  IMPRESSION: Intraoperative localization at L3-L4.   Electronically Signed   By: Augusto Gamble M.D.   On: 08/18/2013 13:36    Assessment/Plan: Postop day #1: The patient is doing well. We will mobilize him with PT and OT. He may go tomorrow.  Hypotension: I spoke with the patient's nurse. We will hold his antihypertensives on the low side.if his blood pressure remains low.  LOS: 1 day     Jaequan Propes D 08/19/2013, 8:03 AM

## 2013-08-19 NOTE — Progress Notes (Signed)
Pt is progressing well no more c/o of dizziness after 1 dose of meclizine  Ate 60 % of dinner. Condition fair .

## 2013-08-19 NOTE — Progress Notes (Signed)
Patient ambulated half hallway with RN early morning. Tolerated well with a walker. Did complain about Left shoulder pain. Prn pain medicine helped him.

## 2013-08-19 NOTE — Progress Notes (Signed)
Pt care of dizziness s while eating breakfast vital signs checked and documented assisted to br to void and back to bed pt had a small emesis with food particles and verbalized  Feeling okay at present and declined zofran to be given  .No acute distress at present due meds given. RN will continue to monitor

## 2013-08-19 NOTE — Progress Notes (Signed)
INITIAL NUTRITION ASSESSMENT  DOCUMENTATION CODES Per approved criteria  -Not Applicable   INTERVENTION: 1. Glucerna Shake po daily, each supplement provides 220 kcal and 10 grams of protein.   NUTRITION DIAGNOSIS: Inadequate oral intake related to dizziness as evidenced by 0% meal completion x2 meals.   Goal: PO intake to meet >/=90% estimated nutrition needs.   Monitor:  PO intake, weight trends, wound healing, labs   Reason for Assessment: Consult  79 y.o. male  Admitting Dx: <principal problem not specified>  ASSESSMENT: Pt admitted for spinal stenosis at L2-3, L3-4, and L4-5. S/p L2, L3 and L4 laminectomy.   RD consulted for problems eating related to dizziness. Pt states that as soon as he sits up to eat he becomes dizzy and unable to swallow the foods. Pt was not able to eat breakfast or lunch. Per discussion pt has only tried to sit up for meals. Pt has a hx of vertigo and states this is similar to that. RN has notified MD.   Encouraged pt to try eating in different positions as able. Will add oral nutrition supplement to help with missed meals. Also encouraged cold foods as they are usually better tolerated. Pt/family also with concerns with recent elevated blood sugars. Pt has hx of DM2- explained that the MD will continue to follow. Current diet, Carb Mod medium is appropriate.   Weight hx shows slow decline in weight, 9% body weight, in the past 6 months.  Height: Ht Readings from Last 1 Encounters:  08/18/13 5\' 10"  (1.778 m)    Weight: Wt Readings from Last 1 Encounters:  08/18/13 167 lb (75.75 kg)    Ideal Body Weight: 166 lbs   % Ideal Body Weight: 100%  Wt Readings from Last 10 Encounters:  08/18/13 167 lb (75.75 kg)  08/18/13 167 lb (75.75 kg)  08/13/13 167 lb (75.751 kg)  08/13/13 167 lb (75.751 kg)  06/04/13 168 lb (76.204 kg)  05/27/13 170 lb (77.111 kg)  05/27/13 171 lb (77.565 kg)  03/12/13 173 lb (78.472 kg)  02/20/13 183 lb 6 oz (83.178  kg)  02/10/13 182 lb (82.555 kg)    Usual Body Weight: 182 lbs   % Usual Body Weight: 167 lbs   BMI:  Body mass index is 23.96 kg/(m^2). WNL   Estimated Nutritional Needs: Kcal: 1800-2000 Protein: 80-90 gm  Fluid: 1.8-2 L   Skin: back incision   Diet Order: Carb Control  EDUCATION NEEDS: -No education needs identified at this time   Intake/Output Summary (Last 24 hours) at 08/19/13 1430 Last data filed at 08/19/13 0200  Gross per 24 hour  Intake      0 ml  Output   1305 ml  Net  -1305 ml    Last BM: PTA   Labs:  No results found for this basename: NA, K, CL, CO2, BUN, CREATININE, CALCIUM, MG, PHOS, GLUCOSE,  in the last 168 hours  CBG (last 3)   Recent Labs  08/18/13 2130 08/19/13 0706 08/19/13 1155  GLUCAP 189* 138* 240*   Lab Results  Component Value Date   HGBA1C 7.6* 05/27/2013    Scheduled Meds: . amLODipine  2.5 mg Oral Daily  . cholecalciferol  1,000 Units Oral Daily  . docusate sodium  100 mg Oral BID  . doxazosin  2 mg Oral QHS  . fesoterodine  4 mg Oral Daily  . linagliptin  5 mg Oral Daily  . metFORMIN  1,000 mg Oral BID WC  . multivitamin with minerals  1 tablet Oral Daily  . naproxen  250 mg Oral BID WC  . pantoprazole  40 mg Oral Daily  . simvastatin  20 mg Oral q1800    Continuous Infusions: . lactated ringers 50 mL/hr (08/18/13 0739)  . lactated ringers      Past Medical History  Diagnosis Date  . Diabetes mellitus     type 2  . Diverticulosis of colon   . GERD (gastroesophageal reflux disease)   . Osteoarthritis   . Stress incontinence, male     urge  . Dyslipidemia   . Hernia, hiatal   . Hyperlipidemia   . Hypertension   . DVT (deep venous thrombosis)   . Complication of anesthesia   . PONV (postoperative nausea and vomiting)   . Family history of anesthesia complication     Hx: pt father had " memeory loss"  . Shortness of breath     Hx: of with exertion  . Overactive bladder     Hx: of  . IBS (irritable  bowel syndrome)     Hx: of  . Pneumonia 2002    history of    Past Surgical History  Procedure Laterality Date  . Wrist tendon surgery    . Cts bilateral    . Knee arthroscopy  1988, 1992    bilateral  . Total knee arthroplasty  2005, 2007  . Lumbar left arm  1961  . Tonsillectomy    . Back surgery    . Colonoscopy      Hx: of  . Laminectomy  08/18/2013    DECOMPRESSIVE LAMINECTOMIES    Clarene Duke RD, LDN Pager [phone] After Hours pager [phone]

## 2013-08-19 NOTE — Progress Notes (Signed)
Pt still c/o  Dizziness during lunch unable to eat meal ginger ale and crackers also given but pt did not tolerate very well. VS checked , BP remained low 108/53 hr 56 MD Jenkins notified , new orders given& carried out .will continue to monitor pt

## 2013-08-19 NOTE — Evaluation (Signed)
Physical Therapy Evaluation Patient Details Name: [name] MRN: [id number] DOB: 10-07-1933 Today's Date: 08/19/2013 Time: 4098-1191 PT Time Calculation (min): 16 min  PT Assessment / Plan / Recommendation History of Present Illness  Pt s/p lumbar laminectomy.  Clinical Impression  Pt admitted with above. Pt currently with functional limitations due to the deficits listed below (see PT Problem List). N Pt will benefit from skilled PT to increase their independence and safety with mobility to allow discharge to the venue listed below. Expect pt will continue to progress rapidly.      PT Assessment  Patient needs continued PT services    Follow Up Recommendations  No PT follow up    Does the patient have the potential to tolerate intense rehabilitation      Barriers to Discharge        Equipment Recommendations  None recommended by PT    Recommendations for Other Services     Frequency Min 5X/week    Precautions / Restrictions Precautions Precautions: Back Precaution Booklet Issued: No   Pertinent Vitals/Pain See flow sheet.      Mobility  Bed Mobility Bed Mobility: Rolling Right;Right Sidelying to Sit;Sitting - Scoot to Edge of Bed Rolling Right: 6: Modified independent (Device/Increase time) Right Sidelying to Sit: 6: Modified independent (Device/Increase time);HOB flat Sitting - Scoot to Edge of Bed: 6: Modified independent (Device/Increase time) Details for Bed Mobility Assistance: Incr time.  Verbal cues for initial instruction. Transfers Transfers: Sit to Stand;Stand to Sit Sit to Stand: 5: Supervision;With upper extremity assist;From bed Stand to Sit: 5: Supervision;With upper extremity assist;With armrests;To chair/3-in-1 Details for Transfer Assistance: Verbal cues for hand placement. Ambulation/Gait Ambulation/Gait Assistance: 5: Supervision Ambulation Distance (Feet): 250 Feet Assistive device: Rolling walker Gait Pattern: Step-through  pattern;Decreased stride length Gait velocity: decr    Exercises     PT Diagnosis: Difficulty walking  PT Problem List: Decreased mobility;Decreased knowledge of precautions PT Treatment Interventions: Gait training;DME instruction;Functional mobility training;Therapeutic activities;Patient/family education     PT Goals(Current goals can be found in the care plan section) Acute Rehab PT Goals Patient Stated Goal: Return home PT Goal Formulation: With patient Time For Goal Achievement: 08/23/13 Potential to Achieve Goals: Good  Visit Information  Last PT Received On: 08/19/13 Assistance Needed: +1 History of Present Illness: Pt s/p lumbar laminectomy.       Prior Functioning  Home Living Family/patient expects to be discharged to:: Private residence Living Arrangements: Spouse/significant other Available Help at Discharge: Family Type of Home: House Home Access: Level entry Home Layout: One level Home Equipment: Cane - single point;Walker - 2 wheels Prior Function Level of Independence: Independent Communication Communication: No difficulties    Cognition  Cognition Arousal/Alertness: Awake/alert Behavior During Therapy: WFL for tasks assessed/performed Overall Cognitive Status: Within Functional Limits for tasks assessed    Extremity/Trunk Assessment Upper Extremity Assessment Upper Extremity Assessment: Overall WFL for tasks assessed Lower Extremity Assessment Lower Extremity Assessment: Overall WFL for tasks assessed   Balance Balance Balance Assessed: Yes Static Standing Balance Static Standing - Balance Support: Bilateral upper extremity supported Static Standing - Level of Assistance: 5: Stand by assistance  End of Session PT - End of Session Activity Tolerance: Patient tolerated treatment well Patient left: in chair;with call bell/phone within reach;with family/visitor present Nurse Communication: Mobility status  GP     [facility] 08/19/2013, 12:23  PM  [employer] PT [phone]

## 2013-08-20 ENCOUNTER — Other Ambulatory Visit: Payer: Self-pay | Admitting: *Deleted

## 2013-08-20 MED ORDER — POLYETHYLENE GLYCOL 3350 17 G PO PACK
17.0000 g | PACK | Freq: Every day | ORAL | Status: DC
  Administered 2013-08-20: 17 g via ORAL
  Filled 2013-08-20 (×3): qty 1

## 2013-08-20 MED ORDER — SIMVASTATIN 20 MG PO TABS: 20.0000 mg | ORAL_TABLET | Freq: Every evening | ORAL | Status: DC

## 2013-08-20 NOTE — Progress Notes (Signed)
Physical Therapy Treatment Patient Details Name: [name] MRN: [id number] DOB: [birth year] Today's Date: 08/20/2013 Time: 4098-1191 PT Time Calculation (min): 23 min  PT Assessment / Plan / Recommendation  History of Present Illness Pt s/p lumbar laminectomy.   PT Comments   Pt presents with minimal to no pain, clear understanding of postop back care and physically independent with use of RW for mobility.  Brief balance screening reveal fall risk due to inability to perform single leg stand and unable to stand feet together eyes closed for full 30 seconds... Pt would benefit from high level balance retraining in outpatient setting once back surgery is healed and at discretion of pt and surgeon.  No immediate/postacute need for PT, and pt is safe to mobilize independently in hospital (coordinated with nursing).  No further needs, signing off case.  Thank you!   Follow Up Recommendations  No PT follow up (would consider OPPT once back healed for high level balance)     Does the patient have the potential to tolerate intense rehabilitation     Barriers to Discharge        Equipment Recommendations  None recommended by PT    Recommendations for Other Services    Frequency     Progress towards PT Goals Progress towards PT goals: Goals met/education completed, patient discharged from PT  Plan Current plan remains appropriate    Precautions / Restrictions Precautions Precautions: Back   Pertinent Vitals/Pain 0/10 pain at rest, 1/10 pain with ambulation    Mobility  Bed Mobility Bed Mobility: Right Sidelying to Sit;Sit to Sidelying Right Right Sidelying to Sit: 5: Supervision;6: Modified independent (Device/Increase time);HOB flat Sit to Sidelying Right: 6: Modified independent (Device/Increase time);5: Supervision Details for Bed Mobility Assistance: one time reminder cue to roll to side then sit up to minimize rotational stress on back, pt otherwise safe and independent with  task Transfers Transfers: Sit to Stand;Stand to Sit Sit to Stand: 6: Modified independent (Device/Increase time);From bed Stand to Sit: 6: Modified independent (Device/Increase time);To bed Ambulation/Gait Ambulation/Gait Assistance: 6: Modified independent (Device/Increase time);5: Supervision Ambulation Distance (Feet): 300 Feet Assistive device: Rolling walker Ambulation/Gait Assistance Details: pt is safe and independent with RW.  Assessed gait without device providing close supervision and pt able to ambulate an additonal 30 feet.   Gait Pattern: Step-through pattern Gait velocity: unmeasured, subjectively improved Stairs: No Wheelchair Mobility Wheelchair Mobility: No    Exercises     PT Diagnosis:    PT Problem List:   PT Treatment Interventions:     PT Goals (current goals can now be found in the care plan section)    Visit Information  Last PT Received On: 08/20/13 Assistance Needed: +1 History of Present Illness: Pt s/p lumbar laminectomy.    Subjective Data  Subjective: I feel pretty good   Cognition  Cognition Arousal/Alertness: Awake/alert Behavior During Therapy: WFL for tasks assessed/performed Overall Cognitive Status: Within Functional Limits for tasks assessed    Balance  Balance Balance Assessed: Yes Static Standing Balance Single Leg Stance - Right Leg: 0 Single Leg Stance - Left Leg: 0 (also with right, practiced with 2 to 1 hand support for HEP) Rhomberg - Eyes Opened: 30 (with mild incr sway noted by pt) Rhomberg - Eyes Closed: 14 (incr sway and loss of balance posterior/left)  End of Session PT - End of Session Activity Tolerance: Patient tolerated treatment well Patient left: in bed;with call bell/phone within reach;with nursing/sitter in room Nurse Communication: Mobility status (ok  to ambulate in hall with RW independently.)   GP     Dennis Bast 08/20/2013, 2:05 PM

## 2013-08-20 NOTE — Progress Notes (Addendum)
Inpatient Diabetes Program Recommendations  AACE/ADA: New Consensus Statement on Inpatient Glycemic Control (2013)  Target Ranges:  Prepandial:   less than 140 mg/dL      Peak postprandial:   less than 180 mg/dL (1-2 hours)      Critically ill patients:  140 - 180 mg/dL     Results for TRENTON, PASSOW (MRN [id number]) as of 08/20/2013 10:32  Ref. Range 08/19/2013 07:06 08/19/2013 11:55 08/19/2013 16:34 08/19/2013 21:49  Glucose-Capillary Latest Range: 70-99 mg/dL 782 (H) 956 (H) 213 (H) 174 (H)    **Currently getting home DM oral medications- Having glucose elevations  **MD- Please add Novolog Moderate correction scale (SSI) tid ac + HS.   Will follow. Ambrose Finland RN, MSN, CDE Diabetes Coordinator Inpatient Diabetes Program [phone]

## 2013-08-20 NOTE — Progress Notes (Signed)
Pt ambulated in the hallway 2x with RN. Tolerated well with a walker. No dizzi spells. Hemovac emptied 20 cc.

## 2013-08-20 NOTE — Progress Notes (Signed)
Patient ID: [name], male   DOB: December 07, 1933, 79 y.o.   MRN: [id number] Subjective:  The patient is alert and pleasant. He looks well. He has no complaints. He wants to go home tomorrow.  Objective: Vital signs in last 24 hours: Temp:  [97.7 F (36.5 C)-98.3 F (36.8 C)] 97.7 F (36.5 C) (09/10 1359) Pulse Rate:  [53-63] 62 (09/10 1359) Resp:  [16-18] 17 (09/10 1359) BP: (102-116)/(51-64) 114/51 mmHg (09/10 1359) SpO2:  [96 %-100 %] 100 % (09/10 1359)  Intake/Output from previous day: 09/09 0701 - 09/10 0700 In: 697 [P.O.:697] Out: 770 [Urine:750; Drains:20] Intake/Output this shift: Total I/O In: 360 [P.O.:360] Out: 250 [Urine:250]  Physical exam the patient is alert and oriented. His strength is normal.  Lab Results: No results found for this basename: WBC, HGB, HCT, PLT,  in the last 72 hours BMET No results found for this basename: NA, K, CL, CO2, GLUCOSE, BUN, CREATININE, CALCIUM,  in the last 72 hours  Studies/Results: No results found.  Assessment/Plan: Postop day #2: The patient is doing well. We will remove his Hemovac drain. I will plan to send him home tomorrow.  LOS: 2 days     [name] D 08/20/2013, 6:36 PM

## 2013-08-21 LAB — GLUCOSE, CAPILLARY
Glucose-Capillary: 160 mg/dL — ABNORMAL HIGH (ref 70–99)
Glucose-Capillary: 150 mg/dL — ABNORMAL HIGH (ref 70–99)

## 2013-08-21 MED ORDER — DSS 100 MG PO CAPS: 100.0000 mg | ORAL_CAPSULE | Freq: Two times a day (BID) | ORAL | Status: DC

## 2013-08-21 MED ORDER — DIAZEPAM 5 MG PO TABS
5.0000 mg | ORAL_TABLET | Freq: Four times a day (QID) | ORAL | Status: DC | PRN
Start: 2013-08-21 — End: 2014-01-09

## 2013-08-21 MED ORDER — OXYCODONE-ACETAMINOPHEN 5-325 MG PO TABS: 1.0000 | ORAL_TABLET | ORAL | Status: DC | PRN

## 2013-08-21 NOTE — Progress Notes (Signed)
Pt health education, prescriptions and d/c summary given. IV d/c ed, Rolled out in wheelchair.

## 2013-08-21 NOTE — Discharge Summary (Signed)
Physician Discharge Summary  Patient ID: [name] MRN: [id number] DOB/AGE: 1933-01-31 79 y.o.  Admit date: 08/18/2013 Discharge date: 08/21/2013  Admission Diagnoses: L2-3, L3-4 and L4-5 spinal stenosis, lumbago, lumbar radiculopathy, neurogenic claudication  Discharge Diagnoses: The same Active Problems:   * No active hospital problems. *   Discharged Condition: good  Hospital Course: I performed at L2-3, L3-4 and L4-5 laminectomy on the patient on 08/18/2013. The surgery went well.  The patient's postoperative course was unremarkable. On postop day #3 the patient requested discharge to home. The patient, and his wife, were given oral and written discharge instructions. All their questions were answered.  Consults: PT/OT Significant Diagnostic Studies: None Treatments: L2-3, L3-4 and L4-5 laminectomy for decompression of the bilateral L3, L4 and L5 nerve roots using microdissection. Discharge Exam: Blood pressure 106/52, pulse 56, temperature 98 F (36.7 C), temperature source Oral, resp. rate 20, height 5\' 10"  (1.778 m), weight 75.75 kg (167 lb), SpO2 98.00%. The patient is alert and pleasant. He looks well. His strength is normal. His dressing is clean and dry.  Disposition: Home  Discharge Orders   Future Appointments Provider Department Dept Phone   09/09/2013 3:45 PM Carlus Pavlov, MD [facility] PRIMARY CARE ENDOCRINOLOGY [phone]   02/13/2014 8:00 AM Lindley Magnus, MD [facility] [place] [phone]   Future Orders Complete By Expires   Call MD for:  difficulty breathing, headache or visual disturbances  As directed    Call MD for:  extreme fatigue  As directed    Call MD for:  hives  As directed    Call MD for:  persistant dizziness or light-headedness  As directed    Call MD for:  persistant nausea and vomiting  As directed    Call MD for:  redness, tenderness, or signs of infection (pain, swelling, redness, odor or green/yellow discharge around  incision site)  As directed    Call MD for:  severe uncontrolled pain  As directed    Call MD for:  temperature >100.4  As directed    Diet - low sodium heart healthy  As directed    Discharge instructions  As directed    Comments:     Call [phone] for a followup appointment. Take a stool softener while you are using pain medications.   Driving Restrictions  As directed    Comments:     Do not drive for 2 weeks.   Increase activity slowly  As directed    Lifting restrictions  As directed    Comments:     Do not lift more than 5 pounds. No excessive bending or twisting.   May shower / Bathe  As directed    Comments:     He may shower after the pain she is removed 3 days after surgery. Leave the incision alone.   No dressing needed  As directed        Medication List         ALEVE 220 MG Caps  Generic drug:  Naproxen Sodium  Take 1 capsule by mouth 2 (two) times daily.     amLODipine 5 MG tablet  Commonly known as:  NORVASC  Take 2.5 mg by mouth daily.     aspirin 81 MG tablet  Take 81 mg by mouth daily.     b complex vitamins tablet  Take 1 tablet by mouth daily.     Chromium-Cinnamon [phone] MCG-MG Caps  Take 1 capsule by mouth daily.  diazepam 5 MG tablet  Commonly known as:  VALIUM  Take 1 tablet (5 mg total) by mouth every 6 (six) hours as needed.     doxazosin 4 MG tablet  Commonly known as:  CARDURA  Take 2 mg by mouth at bedtime.     DSS 100 MG Caps  Take 100 mg by mouth 2 (two) times daily.     glucose blood test strip  1 each by Other route 3 (three) times daily as needed. Use as instructed     metFORMIN 1000 MG tablet  Commonly known as:  GLUCOPHAGE  Take 1,000 mg by mouth 2 (two) times daily with a meal.     multivitamin tablet  Take 1 tablet by mouth daily.     omeprazole 20 MG capsule  Commonly known as:  PRILOSEC  Take 20 mg by mouth daily.     ONETOUCH DELICA LANCETS 33G Misc  1 each daily.     oxyCODONE-acetaminophen 5-325  MG per tablet  Commonly known as:  PERCOCET/ROXICET  Take 1-2 tablets by mouth every 4 (four) hours as needed.     simvastatin 20 MG tablet  Commonly known as:  ZOCOR  Take 1 tablet (20 mg total) by mouth every evening.     sitaGLIPtin 100 MG tablet  Commonly known as:  JANUVIA  Take 100 mg by mouth daily.     TOVIAZ 4 MG Tb24 tablet  Generic drug:  fesoterodine  Take 4 mg by mouth daily.     Vitamin D-3 1000 UNITS Caps  Take 1 capsule by mouth daily.         SignedCristi Loron 08/21/2013, 11:27 AM

## 2013-08-28 ENCOUNTER — Ambulatory Visit: Payer: Medicare Other | Admitting: Internal Medicine

## 2013-09-08 ENCOUNTER — Telehealth: Payer: Self-pay | Admitting: Internal Medicine

## 2013-09-08 NOTE — Telephone Encounter (Signed)
Pt states he usually has labs prior to fup. pls advise.

## 2013-09-08 NOTE — Telephone Encounter (Signed)
Lipid, liver, cbc, hgba1c, and bmet

## 2013-09-09 ENCOUNTER — Ambulatory Visit (INDEPENDENT_AMBULATORY_CARE_PROVIDER_SITE_OTHER): Payer: Medicare Other | Admitting: Internal Medicine

## 2013-09-09 ENCOUNTER — Encounter: Payer: Self-pay | Admitting: Internal Medicine

## 2013-09-09 VITALS — BP 118/64 | HR 107 | Temp 97.7°F | Resp 12 | Wt 166.0 lb

## 2013-09-09 DIAGNOSIS — E119 Type 2 diabetes mellitus without complications: Secondary | ICD-10-CM

## 2013-09-09 NOTE — Progress Notes (Signed)
  Subjective:     Patient ID: [name], male   DOB: 1933-01-05, 79 y.o.   MRN: [id number]  HPI [name] is a very pleasant 79 year old man returning for management of DM2, dx 07/2012, uncontrolled, non-insulin-dependent, with complications (peripheral neuropathy). Pt is here with his wife. Last visit 3 mo ago.  He had back surgery on 08/18/2013. He feels much better after the surgery. He could not move much and sugars are a little higher now. He will start moving more and was cleared by surgery to go for walks.   Last HbA1C was: Lab Results  Component Value Date   HGBA1C 7.6* 05/27/2013  His last A1c was 13.4%.   He is on a regimen of: - Metformin 1000 mg bid - Januvia 100 mg daily He is tolerating these well.  Pt again brings a beautifully-kept log, he checks sugars 3-4x a day before meals and they are (reviewed his log) - am: 117-190 - before lunch: 148-178 - 2h after lunch: 154-178 - dinner: 171 - bedtime: 117-158 No lows. He started to eat healthier, but cannot exercise b/c of back surgery.   - no CKD. Last BUN/Cr: Lab Results  Component Value Date   BUN 17 08/06/2013   CREATININE 0.7 08/06/2013  Last ACR was normal in 05/2013. - He has peripheral neuropathy. He is taking a B complex vitamin, and he got a neuropathy cream  - Last eye exam 11/2012: no DR (Dr. London Sheer).   I reviewed pt's medications, allergies, PMH, social hx, family hx and no changes required, except as above.  Review of Systems Constitutional: no weight changes, no fatigue, no subjective hyperthermia/hypothermia, + poor sleep, + nocturia >1 Eyes: no blurry vision, no xerophthalmia ENT: no sore throat, no dysphagia/odynophagia, + hoarseness Cardiovascular: no CP/no SOB/palpitations/leg swelling Respiratory: no cough/SOB Gastrointestinal: no N/V/D/C Musculoskeletal: + muscle/+ joint aches - back, after sx Skin: no rashes  Objective:   Physical Exam BP 118/64  Pulse 107  Temp(Src) 97.7 F  (36.5 C) (Oral)  Resp 12  Wt 166 lb (75.297 kg)  BMI 23.82 kg/m2  SpO2 96% Wt Readings from Last 3 Encounters:  09/09/13 166 lb (75.297 kg)  08/18/13 167 lb (75.75 kg)  08/18/13 167 lb (75.75 kg)  Constitutional: normal weight, in NAD Eyes: PERRLA, EOMI, no exophthalmos ENT: moist mucous membranes, no thyromegaly, no cervical lymphadenopathy Cardiovascular: RRR, No MRG Respiratory: CTA B Gastrointestinal: abdomen soft, NT, ND, BS+ Skin: moist, warm, no rashes; site of surgery raised, but not erythematous or painful Neurological: no tremor with outstretched hands  Assessment:     1. DM2, noninsulin-dependent, uncontrolled, with complications - fairly good control - target A1C for him: 7-7.5% - peripheral neuropathy  Plan:     1. Diabetes mellitus type 2, with recent slightly worsened control after his back surgery  - he is very conscientious about taking his medicines and keeping a good CBG log - we will continue his Metformin and Januvia for now, can add Glipizide in the future if needed, but as he is very sedentary now and plans to start walking for exercise, I thinks we can wait 3 more months and evaluate his sugars again. He agrees with the plan. - will check HbA1C  - RTC in 3 months with sugar log - does not have a computer at home so he cannot join my chart  Will addend HbA1C result when becomes available.

## 2013-09-09 NOTE — Patient Instructions (Addendum)
Please stay on the same diabetes regimen. Please start walking as tolerated. Please let me know if if sugars stay >180 consistently. Please return in 3 months with your sugar log.

## 2013-09-10 NOTE — Telephone Encounter (Signed)
done

## 2013-09-11 ENCOUNTER — Encounter: Payer: Self-pay | Admitting: Internal Medicine

## 2013-09-11 ENCOUNTER — Ambulatory Visit (INDEPENDENT_AMBULATORY_CARE_PROVIDER_SITE_OTHER): Payer: Medicare Other | Admitting: Internal Medicine

## 2013-09-11 VITALS — BP 122/62 | HR 65 | Temp 99.0°F | Ht 70.0 in | Wt 165.5 lb

## 2013-09-11 DIAGNOSIS — B029 Zoster without complications: Secondary | ICD-10-CM

## 2013-09-11 DIAGNOSIS — G609 Hereditary and idiopathic neuropathy, unspecified: Secondary | ICD-10-CM

## 2013-09-11 DIAGNOSIS — I10 Essential (primary) hypertension: Secondary | ICD-10-CM

## 2013-09-11 MED ORDER — VALACYCLOVIR HCL 1 G PO TABS: 1000.0000 mg | ORAL_TABLET | Freq: Three times a day (TID) | ORAL | Status: DC

## 2013-09-11 MED ORDER — LIDOCAINE 5 % EX PTCH: 1.0000 | MEDICATED_PATCH | CUTANEOUS | Status: DC

## 2013-09-11 NOTE — Assessment & Plan Note (Signed)
stable overall by history and exam, recent data reviewed with pt, and pt to continue medical treatment as before,  to f/u any worsening symptoms or concerns BP Readings from Last 3 Encounters:  09/11/13 122/62  09/09/13 118/64  08/21/13 106/52

## 2013-09-11 NOTE — Patient Instructions (Signed)
Please take all new medication as prescribed - the antibiotic, and pain patch if needed OK to take the oxycodone medication at one tab every 6 hrs if needed for pain Please continue all other medications as before, and refills have been done if requested. Please have the pharmacy call with any other refills you may need.  Please remember to sign up for My Chart if you have not done so, as this will be important to you in the future with finding out test results, communicating by private email, and scheduling acute appointments online when needed.

## 2013-09-11 NOTE — Assessment & Plan Note (Signed)
D/w pt, no relation between neuritic pain of shingles and his periph neuropathy,  to f/u any worsening symptoms or concerns

## 2013-09-11 NOTE — Assessment & Plan Note (Signed)
X 3 days, for valtrex course, has percocet 5.325 at home, using now bid prn, ok to increase to 1 q 6 for the next few days, and lidoderm patch, will hold on steroid for now

## 2013-09-11 NOTE — Progress Notes (Signed)
Subjective:    Patient ID: [name], male    DOB: 1933-06-02, 79 y.o.   MRN: [id number]  HPI  Here with acute onset painful 5/10  itchy bumpy rash to right mid back, with evolving around the side of the chest to near the sternum over the last 3 days, no fever, trauma. Had Recent back surgury.Pt denies chest pain, increased sob or doe, wheezing, orthopnea, PND, increased LE swelling, palpitations, dizziness or syncope.  Pt denies new neurological symptoms such as new headache, or facial or extremity weakness or numbness  Pt notes BP med reduced last visit with Dr Cato Mulligan and now dizziness, weakness resolved.  Past Medical History  Diagnosis Date  . Diabetes mellitus     type 2  . Diverticulosis of colon   . GERD (gastroesophageal reflux disease)   . Osteoarthritis   . Stress incontinence, male     urge  . Dyslipidemia   . Hernia, hiatal   . Hyperlipidemia   . Hypertension   . DVT (deep venous thrombosis)   . Complication of anesthesia   . PONV (postoperative nausea and vomiting)   . Family history of anesthesia complication     Hx: pt father had " memeory loss"  . Shortness of breath     Hx: of with exertion  . Overactive bladder     Hx: of  . IBS (irritable bowel syndrome)     Hx: of  . Pneumonia 2002    history of   Past Surgical History  Procedure Laterality Date  . Wrist tendon surgery    . Cts bilateral    . Knee arthroscopy  1988, 1992    bilateral  . Total knee arthroplasty  2005, 2007  . Lumbar left arm  1961  . Tonsillectomy    . Back surgery    . Colonoscopy      Hx: of  . Laminectomy  08/18/2013    DECOMPRESSIVE LAMINECTOMIES  . Lumbar laminectomy/decompression microdiscectomy N/A 08/18/2013    Procedure: Lumbar two-three, lumbar three-four, lumbar four-five decompressive laminectomies;  Surgeon: Cristi Loron, MD;  Location: [facility];  Service: Neurosurgery;  Laterality: N/A;    reports that he has never smoked. He has never used smokeless  tobacco. He reports that he does not drink alcohol or use illicit drugs. family history includes Heart disease in his mother; Hyperlipidemia in his son; Hypertension in his mother; Other (age of onset: 52) in his mother. Allergies  Allergen Reactions  . Ciprofloxacin Other (See Comments)    "made me have a weird feeling. Disoriented."   . Sulfamethoxazole     REACTION: rash   Current Outpatient Prescriptions on File Prior to Visit  Medication Sig Dispense Refill  . amLODipine (NORVASC) 5 MG tablet Take 2.5 mg by mouth daily.      Marland Kitchen aspirin 81 MG tablet Take 81 mg by mouth daily.        Marland Kitchen b complex vitamins tablet Take 1 tablet by mouth daily.      . Cholecalciferol (VITAMIN D-3) 1000 UNITS CAPS Take 1 capsule by mouth daily.      . Chromium-Cinnamon 2052581415 MCG-MG CAPS Take 1 capsule by mouth daily.      . diazepam (VALIUM) 5 MG tablet Take 1 tablet (5 mg total) by mouth every 6 (six) hours as needed.  50 tablet  0  . docusate sodium 100 MG CAPS Take 100 mg by mouth 2 (two) times daily.  60 capsule  0  .  doxazosin (CARDURA) 4 MG tablet Take 2 mg by mouth at bedtime.      . fesoterodine (TOVIAZ) 4 MG TB24 tablet Take 4 mg by mouth daily.      Marland Kitchen glucose blood test strip 1 each by Other route 3 (three) times daily as needed. Use as instructed      . metFORMIN (GLUCOPHAGE) 1000 MG tablet Take 1,000 mg by mouth 2 (two) times daily with a meal.      . Multiple Vitamin (MULTIVITAMIN) tablet Take 1 tablet by mouth daily.      . Naproxen Sodium (ALEVE) 220 MG CAPS Take 1 capsule by mouth 2 (two) times daily.      Marland Kitchen omeprazole (PRILOSEC) 20 MG capsule Take 20 mg by mouth daily.      Letta Pate DELICA LANCETS 33G MISC 1 each daily.      Marland Kitchen oxyCODONE-acetaminophen (PERCOCET/ROXICET) 5-325 MG per tablet Take 1-2 tablets by mouth every 4 (four) hours as needed.  100 tablet  0  . simvastatin (ZOCOR) 20 MG tablet Take 1 tablet (20 mg total) by mouth every evening.  90 tablet  1  . sitaGLIPtin (JANUVIA)  100 MG tablet Take 100 mg by mouth daily.       No current facility-administered medications on file prior to visit.   Review of Systems  Constitutional: Negative for unexpected weight change, or unusual diaphoresis  HENT: Negative for tinnitus.   Eyes: Negative for photophobia and visual disturbance.  Respiratory: Negative for choking and stridor.   Gastrointestinal: Negative for vomiting and blood in stool.  Genitourinary: Negative for hematuria and decreased urine volume.  Musculoskeletal: Negative for acute joint swelling Skin: Negative for color change and wound.  Neurological: Negative for tremors and numbness other than noted  Psychiatric/Behavioral: Negative for decreased concentration or  hyperactivity.       Objective:   Physical Exam BP 122/62  Pulse 65  Temp(Src) 99 F (37.2 C) (Oral)  Ht 5\' 10"  (1.778 m)  Wt 165 lb 8 oz (75.07 kg)  BMI 23.75 kg/m2  SpO2 96% VS noted,  Constitutional: Pt appears well-developed and well-nourished.  HENT: Head: NCAT.  Right Ear: External ear normal.  Left Ear: External ear normal.  Eyes: Conjunctivae and EOM are normal. Pupils are equal, round, and reactive to light.  Neck: Normal range of motion. Neck supple.  Cardiovascular: Normal rate and regular rhythm.   Pulmonary/Chest: Effort normal and breath sounds normal.  Abd:  Soft, NT, non-distended, + BS Neurological: Pt is alert. Not confused  Skin: typical mutl grouped vesicles on erythem base at right t4 dermatome Psychiatric: Pt behavior is normal. Thought content normal.     Assessment & Plan:

## 2013-10-07 ENCOUNTER — Other Ambulatory Visit: Payer: Self-pay | Admitting: *Deleted

## 2013-10-07 MED ORDER — OMEPRAZOLE 20 MG PO CPDR: 20.0000 mg | DELAYED_RELEASE_CAPSULE | Freq: Every day | ORAL | Status: DC

## 2013-10-09 ENCOUNTER — Telehealth: Payer: Self-pay | Admitting: Internal Medicine

## 2013-10-09 NOTE — Telephone Encounter (Signed)
[name] still has a place where the Shingles has not cleared up all the way on his shoulder.  Should he come in or can more of the medicine be called in?

## 2013-10-09 NOTE — Telephone Encounter (Signed)
Called the patient informed of MD instructions.  Stated he would call back if need to.

## 2013-10-09 NOTE — Telephone Encounter (Signed)
Normally neither is needed; crusting normally takes several wks to finally heal;  We can see him in the office if he wants to be seen (such as with Rene Kocher) but should not need further antibx tx

## 2013-10-25 ENCOUNTER — Other Ambulatory Visit: Payer: Self-pay | Admitting: Internal Medicine

## 2013-10-30 ENCOUNTER — Telehealth: Payer: Self-pay | Admitting: *Deleted

## 2013-10-30 NOTE — Telephone Encounter (Signed)
Yes, let's have him come for a visit.

## 2013-10-30 NOTE — Telephone Encounter (Signed)
Pt called stating that his bg has been gradually increasing. Pt states it has been between 140-150, but for the past 3-4 days it has been increasing. It has been over 200. This AM it was 243. Please advise to med change or does pt need to come in. Thank you.

## 2013-10-30 NOTE — Telephone Encounter (Signed)
Called pt and scheduled a f/u for 3:15 tomorrow.

## 2013-10-31 ENCOUNTER — Ambulatory Visit (INDEPENDENT_AMBULATORY_CARE_PROVIDER_SITE_OTHER): Payer: Medicare Other | Admitting: Internal Medicine

## 2013-10-31 ENCOUNTER — Encounter: Payer: Self-pay | Admitting: Internal Medicine

## 2013-10-31 VITALS — BP 124/62 | HR 76 | Temp 98.3°F | Resp 10 | Wt 161.2 lb

## 2013-10-31 DIAGNOSIS — E119 Type 2 diabetes mellitus without complications: Secondary | ICD-10-CM

## 2013-10-31 LAB — HEMOGLOBIN A1C: Hgb A1c MFr Bld: 8.5 % — ABNORMAL HIGH (ref 4.6–6.5)

## 2013-10-31 MED ORDER — GLIPIZIDE ER 5 MG PO TB24: 5.0000 mg | ORAL_TABLET | Freq: Every day | ORAL | Status: DC

## 2013-10-31 NOTE — Progress Notes (Signed)
Subjective:     Patient ID: [name], male   DOB: 12-Dec-1932, 79 y.o.   MRN: [id number]  HPI [name] is a very pleasant 79 y.o. man returning for management of DM2, dx 07/2012, uncontrolled, non-insulin-dependent, with complications (peripheral neuropathy). Last visit 1.5 mo ago.  He had shingles recently, lesions crusted ~ 1 mo ago, pain-free x 3 weeks.  Last HbA1C was: Lab Results  Component Value Date   HGBA1C 7.6* 05/27/2013   HGBA1C 13.4* 02/03/2013   HGBA1C 14.7* 01/17/2013   He is on a regimen of: - Metformin 1000 mg bid - Januvia 100 mg daily He is tolerating these well.  Pt again brings a beautifully-kept log, he checks sugars 3-4x a day before meals and they are (reviewed his log) - am: 117-190 >> in last 2 weeks 138-243 (140-160) - 2h after b'fast: 184-225 - before lunch: 148-178 >> 165-219 - 2h after lunch: 154-178 >> n/c  - dinner: 171 >> 161-265 - bedtime: 117-158 >> 165-234 No lows.   - no CKD. Last BUN/Cr: Lab Results  Component Value Date   BUN 17 08/06/2013   CREATININE 0.7 08/06/2013  Last ACR was normal in 05/2013. - He has peripheral neuropathy. He is taking a B complex vitamin, and has a neuropathy cream  - Last eye exam 11/2012: no DR (Dr. London Sheer). Will have a new eye exam next week.  I reviewed pt's medications, allergies, PMH, social hx, family hx and no changes required, except as above.  Review of Systems Constitutional: no weight changes, no fatigue, no subjective hyperthermia/hypothermia, + poor sleep, + nocturia >1 Eyes: no blurry vision, no xerophthalmia ENT: no sore throat, no dysphagia/odynophagia, + hoarseness Cardiovascular: no CP/no SOB/palpitations/leg swelling Respiratory: no cough/SOB Gastrointestinal: no N/V/D/+C Musculoskeletal: + muscle/no joint aches Skin: + rash - recent Shingles on upper back  Objective:   Physical Exam BP 124/62  Pulse 76  Temp(Src) 98.3 F (36.8 C) (Oral)  Resp 10  Wt 161 lb 3.2 oz (73.12  kg)  SpO2 98% Wt Readings from Last 3 Encounters:  10/31/13 161 lb 3.2 oz (73.12 kg)  09/11/13 165 lb 8 oz (75.07 kg)  09/09/13 166 lb (75.297 kg)  Constitutional: normal weight, in NAD Eyes: PERRLA, EOMI, no exophthalmos ENT: moist mucous membranes, no thyromegaly, no cervical lymphadenopathy Cardiovascular: RRR, No MRG Respiratory: CTA B Gastrointestinal: abdomen soft, NT, ND, BS+ Skin: moist, warm, no rashes; site of surgery raised, but not erythematous or painful Neurological: no tremor with outstretched hands  Assessment:     1. DM2, noninsulin-dependent, uncontrolled, with complications - fairly good control - target A1C for him: 7-7.5% - peripheral neuropathy  Plan:     1. Diabetes mellitus type 2, with recent worsened control for unclear reason since his zoster pain is resolved for the last 3 weeks at least  - he is very conscientious about taking his medicines and keeping a good CBG log - continue his Metformin and Januvia for now - add Glipizide XL 5 mg daily in am >> increase to 10 mg if no lows and sugars still >180 - will check HbA1C today - RTC in 1 month with sugar log - does not have a computer at home so he cannot join my chart  CBG in the office 274 >> his meter/strips are [employer] on 10/31/2013  Component Date Value Range Status  . Hemoglobin A1C 10/31/2013 8.5* 4.6 - 6.5 % Final   Glycemic Control Guidelines for People with  Diabetes:Non Diabetic:  <6%Goal of Therapy: <7%Additional Action Suggested:  >8%    HbA1c worse, as expected - 8.5%, up from 7.6%. Continue Glipizide as advised.

## 2013-10-31 NOTE — Patient Instructions (Signed)
  Continue Metformin and Januvia for now Add Glipizide XL 5 mg daily in am >> increase to 10 mg if you have no lows and sugars still >180  Please return in 1 month with your sugar log.

## 2013-11-03 ENCOUNTER — Other Ambulatory Visit: Payer: Self-pay | Admitting: *Deleted

## 2013-11-03 MED ORDER — METFORMIN HCL 1000 MG PO TABS: 1000.0000 mg | ORAL_TABLET | Freq: Two times a day (BID) | ORAL | Status: DC

## 2013-11-03 MED ORDER — SITAGLIPTIN PHOSPHATE 100 MG PO TABS: 100.0000 mg | ORAL_TABLET | Freq: Every day | ORAL | Status: DC

## 2013-11-14 ENCOUNTER — Ambulatory Visit: Payer: 59 | Admitting: Internal Medicine

## 2013-11-17 LAB — HM DIABETES EYE EXAM

## 2013-11-21 ENCOUNTER — Other Ambulatory Visit: Payer: Self-pay | Admitting: Internal Medicine

## 2013-11-26 ENCOUNTER — Encounter: Payer: Self-pay | Admitting: Gastroenterology

## 2013-12-03 ENCOUNTER — Other Ambulatory Visit: Payer: Self-pay | Admitting: *Deleted

## 2013-12-03 MED ORDER — GLIPIZIDE ER 5 MG PO TB24: ORAL_TABLET | ORAL | Status: DC

## 2013-12-09 ENCOUNTER — Ambulatory Visit (INDEPENDENT_AMBULATORY_CARE_PROVIDER_SITE_OTHER): Payer: Medicare Other | Admitting: Internal Medicine

## 2013-12-09 ENCOUNTER — Encounter: Payer: Self-pay | Admitting: Internal Medicine

## 2013-12-09 VITALS — BP 128/68 | HR 65 | Temp 98.0°F | Resp 10 | Wt 160.2 lb

## 2013-12-09 DIAGNOSIS — E119 Type 2 diabetes mellitus without complications: Secondary | ICD-10-CM

## 2013-12-09 NOTE — Progress Notes (Signed)
  Subjective:     Patient ID: [name], male   DOB: March 30, 1933, 79 y.o.   MRN: [id number]  HPI [name] is a very pleasant 79 y.o. man returning for management of DM2, dx 07/2012, uncontrolled, non-insulin-dependent, with complications (peripheral neuropathy). Last visit 1 mo ago.  Last HbA1C was: Lab Results  Component Value Date   HGBA1C 8.5* 10/31/2013   HGBA1C 7.6* 05/27/2013   HGBA1C 13.4* 02/03/2013   He is on a regimen of: - Metformin 1000 mg bid - Januvia 100 mg daily - Glipizide XL 10 mg - added last visit as sugars started to increase to upper 200s He is tolerating these well.  Pt again brings a beautifully-kept log, he checks sugars 3-4x a day before meals and they are [facility] IMPROVED after addition of Glipizide (reviewed his log) - am: 117-190 >> in last 2 weeks 138-243 (140-160) >> 117-147 - 2h after b'fast: 184-225 >> 151-163 - before lunch: 148-178 >> 165-219 >> 107-110 - 2h after lunch: 154-178 >> n/c >> 161-170 - dinner: 171 >> 161-265 >> n/c - 2h after dinner: 141-174 - bedtime: 117-158 >> 165-234 >> 104-166 No lows.   His mother died mid-December and he had a lot of stress around that time, but sugars maintained at good levels.  - no CKD. Last BUN/Cr: Lab Results  Component Value Date   BUN 17 08/06/2013   CREATININE 0.7 08/06/2013  Last ACR was normal in 05/2013. - He has peripheral neuropathy. He is taking a B complex vitamin, and has a neuropathy cream. Seeing Dr Elvin So at the [facility]. Last foot exam 02/2013. - Last eye exam 11/17/2013: no DR, new glasses (Dr. London Sheer).   I reviewed pt's medications, allergies, PMH, social hx, family hx and no changes required, except as above.  Review of Systems Constitutional: no weight changes, no fatigue, no subjective hyperthermia/hypothermia, + poor sleep, + nocturia >1 Eyes: no blurry vision, no xerophthalmia ENT: no sore throat, no dysphagia/odynophagia/hoarseness Cardiovascular: no CP/no  SOB/palpitations/leg swelling Respiratory: no cough/SOB Gastrointestinal: no N/V/D/+C Musculoskeletal: + muscle/no joint aches Skin: no rashes  Objective:   Physical Exam BP 128/68  Pulse 65  Temp(Src) 98 F (36.7 C) (Oral)  Resp 10  Wt 160 lb 3.2 oz (72.666 kg)  SpO2 97% Wt Readings from Last 3 Encounters:  12/09/13 160 lb 3.2 oz (72.666 kg)  10/31/13 161 lb 3.2 oz (73.12 kg)  09/11/13 165 lb 8 oz (75.07 kg)  Constitutional: normal weight, in NAD Eyes: PERRLA, EOMI, no exophthalmos ENT: moist mucous membranes, no thyromegaly, no cervical lymphadenopathy Cardiovascular: RRR, No MRG Respiratory: CTA B Gastrointestinal: abdomen soft, NT, ND, BS+ Skin: moist, warm, no rashes; site of surgery raised, but not erythematous or painful Neurological: no tremor with outstretched hands  Assessment:     1. DM2, noninsulin-dependent, uncontrolled, with complications - fairly good control - target A1C for him: 7-7.5% - peripheral neuropathy  Plan:     1. Diabetes mellitus type 2, with much improved control after adding Glipizide XL and increasing from 5 mg to 10 mg daily - he is very conscientious about taking his medicines and keeping a good CBG log - continue his Metformin, Januvia, and Glipizide XL  - will check HbA1C at next visit - RTC in 3 mo with sugar log - up to date with eye exams and foot exams - updated chart - does not have a computer at home so he cannot join my chart

## 2013-12-09 NOTE — Patient Instructions (Signed)
Please continue current regimen.  HAPPY NEW YEAR!

## 2013-12-25 ENCOUNTER — Encounter: Payer: Self-pay | Admitting: Gastroenterology

## 2013-12-30 ENCOUNTER — Other Ambulatory Visit: Payer: Self-pay | Admitting: Internal Medicine

## 2014-01-09 ENCOUNTER — Ambulatory Visit: Payer: Medicare Other | Admitting: Gastroenterology

## 2014-01-09 ENCOUNTER — Ambulatory Visit (INDEPENDENT_AMBULATORY_CARE_PROVIDER_SITE_OTHER): Payer: Medicare Other | Admitting: Gastroenterology

## 2014-01-09 ENCOUNTER — Encounter: Payer: Self-pay | Admitting: Gastroenterology

## 2014-01-09 VITALS — BP 120/62 | HR 76 | Ht 70.0 in | Wt 162.0 lb

## 2014-01-09 DIAGNOSIS — Z1211 Encounter for screening for malignant neoplasm of colon: Secondary | ICD-10-CM

## 2014-01-09 NOTE — Progress Notes (Signed)
Review of pertinent gastrointestinal problems: 1. routine risk for colon cancer.  Colonoscopy Dr. Inocente Salles February 2004. Diverticulosis and hemorrhoids were noted. 2 subcentimeter polyps were removed from the left colon and these were hyperplastic on pathology. He was recommended by Dr. Inocente Salles to have a repeat colonoscopy every 3 years and also yearly Hemoccult testing. His colonoscopy in 2007 by Dr. Inocente Salles found no polyps. The indications for that examination were written to be history of "adenomatous polyps."  On my review I see no history of adenomatous polyps on pathology. Patient tells me he has only had those 2 colonoscopies.   HPI: This is a  very pleasant 80 year old man whom I last saw many years ago around the time of a dysphagia workup.  He is here to discuss his history of colon polyps, consider repeat: Cancer screening based on recommendations by his previous gastroenterologist.   2007 colonoscopy with SML, normal, done for "adenomatous polyp"  Constipated, will need stool softners at times.  Has lost 52 pounds in past 12 months.  Had shingles, then back operation.    Has troubles with his legs, having trouble walking more and more.  No overt gi bleeding.  No colon cancer in his family.   Review of systems: Pertinent positive and negative review of systems were noted in the above HPI section. Complete review of systems was performed and was otherwise normal.    Past Medical History  Diagnosis Date  . Diabetes mellitus     type 2  . Diverticulosis of colon   . GERD (gastroesophageal reflux disease)   . Osteoarthritis   . Stress incontinence, male     urge  . Dyslipidemia   . Hernia, hiatal   . Hyperlipidemia   . Hypertension   . DVT (deep venous thrombosis)   . Complication of anesthesia   . PONV (postoperative nausea and vomiting)   . Family history of anesthesia complication     Hx: pt father had " memeory loss"  . Shortness of breath     Hx: of with exertion   . Overactive bladder     Hx: of  . IBS (irritable bowel syndrome)     Hx: of  . Pneumonia 2002    history of    Past Surgical History  Procedure Laterality Date  . Wrist tendon surgery    . Cts bilateral    . Knee arthroscopy  1988, 1992    bilateral  . Total knee arthroplasty  2005, 2007  . Lumbar left arm  1961  . Tonsillectomy    . Back surgery    . Colonoscopy      Hx: of  . Laminectomy  08/18/2013    DECOMPRESSIVE LAMINECTOMIES  . Lumbar laminectomy/decompression microdiscectomy N/A 08/18/2013    Procedure: Lumbar two-three, lumbar three-four, lumbar four-five decompressive laminectomies;  Surgeon: [name], MD;  Location: [place] [facility];  Service: Neurosurgery;  Laterality: N/A;    Current Outpatient Prescriptions  Medication Sig Dispense Refill  . amLODipine (NORVASC) 5 MG tablet TAKE ONE TABLET BY MOUTH DAILY  90 tablet  4  . aspirin 81 MG tablet Take 81 mg by mouth daily.        Marland Kitchen docusate sodium 100 MG CAPS Take 100 mg by mouth 2 (two) times daily.  60 capsule  0  . doxazosin (CARDURA) 4 MG tablet Take 2 mg by mouth at bedtime.      . gabapentin (NEURONTIN) 300 MG capsule Take 300 mg by mouth as  directed.      Marland Kitchen glipiZIDE (GLUCOTROL XL) 5 MG 24 hr tablet Take 2 tablets by mouth daily  60 tablet  1  . GLIPIZIDE XL 5 MG 24 hr tablet TAKE 2 TABLETS  (10 MG TOTAL) BY MOUTH DAILY  60 tablet  6  . Glucosamine HCl 1500 MG TABS Take by mouth.      Marland Kitchen glucose blood test strip 1 each by Other route 3 (three) times daily as needed. Use as instructed      . metFORMIN (GLUCOPHAGE) 1000 MG tablet Take 1 tablet (1,000 mg total) by mouth 2 (two) times daily with a meal.  60 tablet  2  . Multiple Vitamin (MULTIVITAMIN) tablet Take 1 tablet by mouth daily.      . Multiple Vitamins-Minerals (CENTRUM SILVER PO) Take by mouth.      . Naproxen Sodium (ALEVE) 220 MG CAPS Take 1 capsule by mouth 2 (two) times daily.      Marland Kitchen omeprazole (PRILOSEC) 20 MG capsule Take 1 capsule (20 mg  total) by mouth daily.  90 capsule  1  . ONETOUCH DELICA LANCETS 02V MISC 1 each daily.      . simvastatin (ZOCOR) 20 MG tablet TAKE 1 TABLET (20 MG TOTAL) BY MOUTH EVERY EVENING.  90 tablet  1  . sitaGLIPtin (JANUVIA) 100 MG tablet Take 1 tablet (100 mg total) by mouth daily.  30 tablet  3   No current facility-administered medications for this visit.    Allergies as of 01/09/2014 - Review Complete 01/09/2014  Allergen Reaction Noted  . Ciprofloxacin Other (See Comments) 01/21/2007  . Sulfamethoxazole  06/13/2007    Family History  Problem Relation Age of Onset  . Other Mother 58    CABG  . Heart disease Mother   . Hypertension Mother   . Hyperlipidemia Son     History   Social History  . Marital Status: Married    Spouse Name: N/A    Number of Children: N/A  . Years of Education: N/A   Occupational History  . Not on file.   Social History Main Topics  . Smoking status: Never Smoker   . Smokeless tobacco: Never Used  . Alcohol Use: No  . Drug Use: No  . Sexual Activity: Not on file   Other Topics Concern  . 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, sports   Hobbies: sports       Physical Exam: BP 120/62  Pulse 76  Ht 5\' 10"  (1.778 m)  Wt 162 lb (73.483 kg)  BMI 23.24 kg/m2 Constitutional: generally well-appearing Psychiatric: alert and oriented x3 Eyes: extraocular movements intact Mouth: oral pharynx moist, no lesions Neck: supple no lymphadenopathy Cardiovascular: heart regular rate and rhythm Lungs: clear to auscultation bilaterally Abdomen: soft, nontender, nondistended, no obvious ascites, no peritoneal signs, normal bowel sounds Extremities: no lower extremity edema bilaterally Skin: no lesions on visible extremities    Assessment and plan: 80 y.o. male with  routine risk for colon cancer  On 2007 colonoscopy he was written to have had previous adenomatous  polyps however I do not see that to truly be the case. He has never had precancerous polyps removed from his colon. He is 20 and his health is gradually failing. I don't think colon cancer screening is necessarily a very relevant question for him anymore.  Call if he has  any significant changes or bleeding in his bowels.

## 2014-01-09 NOTE — Patient Instructions (Signed)
Will forgo colon cancer screening but if you have significant bowel troubles or bleeding.

## 2014-01-27 ENCOUNTER — Other Ambulatory Visit: Payer: Self-pay | Admitting: Internal Medicine

## 2014-01-28 ENCOUNTER — Other Ambulatory Visit: Payer: Self-pay | Admitting: *Deleted

## 2014-01-29 ENCOUNTER — Other Ambulatory Visit: Payer: Self-pay | Admitting: *Deleted

## 2014-01-29 MED ORDER — GLUCOSE BLOOD VI STRP: ORAL_STRIP | Status: DC

## 2014-02-04 ENCOUNTER — Other Ambulatory Visit (INDEPENDENT_AMBULATORY_CARE_PROVIDER_SITE_OTHER): Payer: Medicare Other

## 2014-02-04 DIAGNOSIS — E1165 Type 2 diabetes mellitus with hyperglycemia: Secondary | ICD-10-CM

## 2014-02-04 DIAGNOSIS — IMO0001 Reserved for inherently not codable concepts without codable children: Secondary | ICD-10-CM

## 2014-02-04 DIAGNOSIS — E785 Hyperlipidemia, unspecified: Secondary | ICD-10-CM

## 2014-02-04 DIAGNOSIS — I10 Essential (primary) hypertension: Secondary | ICD-10-CM

## 2014-02-04 LAB — CBC WITH DIFFERENTIAL/PLATELET
BASOS PCT: 0.2 % (ref 0.0–3.0)
Basophils Absolute: 0 10*3/uL (ref 0.0–0.1)
EOS ABS: 0.1 10*3/uL (ref 0.0–0.7)
Eosinophils Relative: 1 % (ref 0.0–5.0)
HEMATOCRIT: 37.5 % — AB (ref 39.0–52.0)
HEMOGLOBIN: 12.1 g/dL — AB (ref 13.0–17.0)
Lymphocytes Relative: 34.9 % (ref 12.0–46.0)
Lymphs Abs: 1.8 10*3/uL (ref 0.7–4.0)
MCHC: 32.2 g/dL (ref 30.0–36.0)
MCV: 90.4 fl (ref 78.0–100.0)
MONO ABS: 0.5 10*3/uL (ref 0.1–1.0)
Monocytes Relative: 9.4 % (ref 3.0–12.0)
NEUTROS ABS: 2.7 10*3/uL (ref 1.4–7.7)
Neutrophils Relative %: 54.5 % (ref 43.0–77.0)
Platelets: 145 10*3/uL — ABNORMAL LOW (ref 150.0–400.0)
RBC: 4.15 Mil/uL — AB (ref 4.22–5.81)
RDW: 16 % — ABNORMAL HIGH (ref 11.5–14.6)
WBC: 5 10*3/uL (ref 4.5–10.5)

## 2014-02-04 LAB — HEMOGLOBIN A1C: Hgb A1c MFr Bld: 8.8 % — ABNORMAL HIGH (ref 4.6–6.5)

## 2014-02-04 LAB — LIPID PANEL
CHOL/HDL RATIO: 2
CHOLESTEROL: 111 mg/dL (ref 0–200)
HDL: 53.6 mg/dL (ref >39.00)
LDL Cholesterol: 52 mg/dL (ref 0–99)
TRIGLYCERIDES: 27 mg/dL (ref 0.0–149.0)
VLDL: 5.4 mg/dL (ref 0.0–40.0)

## 2014-02-04 LAB — HEPATIC FUNCTION PANEL
ALBUMIN: 4 g/dL (ref 3.5–5.2)
ALT: 30 U/L (ref 0–53)
AST: 32 U/L (ref 0–37)
Alkaline Phosphatase: 40 U/L (ref 39–117)
Bilirubin, Direct: 0.1 mg/dL (ref 0.0–0.3)
TOTAL PROTEIN: 7.1 g/dL (ref 6.0–8.3)
Total Bilirubin: 1 mg/dL (ref 0.3–1.2)

## 2014-02-04 LAB — BASIC METABOLIC PANEL
BUN: 14 mg/dL (ref 6–23)
CHLORIDE: 101 meq/L (ref 96–112)
CO2: 28 meq/L (ref 19–32)
Calcium: 9.6 mg/dL (ref 8.4–10.5)
Creatinine, Ser: 0.7 mg/dL (ref 0.4–1.5)
GFR: 123.96 mL/min (ref >60.00)
Glucose, Bld: 157 mg/dL — ABNORMAL HIGH (ref 70–99)
POTASSIUM: 4.4 meq/L (ref 3.5–5.1)
SODIUM: 136 meq/L (ref 135–145)

## 2014-02-05 ENCOUNTER — Other Ambulatory Visit: Payer: 59

## 2014-02-13 ENCOUNTER — Ambulatory Visit (INDEPENDENT_AMBULATORY_CARE_PROVIDER_SITE_OTHER): Payer: Medicare Other | Admitting: Internal Medicine

## 2014-02-13 ENCOUNTER — Encounter: Payer: Self-pay | Admitting: Internal Medicine

## 2014-02-13 VITALS — BP 124/62 | HR 72 | Temp 97.7°F | Ht 70.0 in | Wt 167.0 lb

## 2014-02-13 DIAGNOSIS — E785 Hyperlipidemia, unspecified: Secondary | ICD-10-CM

## 2014-02-13 DIAGNOSIS — R269 Unspecified abnormalities of gait and mobility: Secondary | ICD-10-CM

## 2014-02-13 DIAGNOSIS — I10 Essential (primary) hypertension: Secondary | ICD-10-CM

## 2014-02-13 DIAGNOSIS — E119 Type 2 diabetes mellitus without complications: Secondary | ICD-10-CM

## 2014-02-13 NOTE — Assessment & Plan Note (Addendum)
Patient's blood sugars are poorly controlled. I have reviewed medications. I will not intervene with medications at this time as he is followed by endocrinology. I have told him to be prepared to discuss insulin therapy with dr. Cruzita Lederer

## 2014-02-13 NOTE — Assessment & Plan Note (Signed)
Well controlled. Continue current medications  

## 2014-02-13 NOTE — Progress Notes (Signed)
Pre visit review using our clinic review tool, if applicable. No additional management support is needed unless otherwise documented below in the visit note. 

## 2014-02-13 NOTE — Assessment & Plan Note (Signed)
Adequate control. Continue current medications.

## 2014-02-13 NOTE — Progress Notes (Signed)
Patient has known diabetes. He is tolerating his medications without difficulty. He is up-to-date on immunizations, foot exam, I exam.  Patient with hypertension. He does not monitor his blood pressures at home.  Patient with hyperlipidemia and is tolerating medications without difficulty.  Past Medical History  Diagnosis Date  . Diabetes mellitus     type 2  . Diverticulosis of colon   . GERD (gastroesophageal reflux disease)   . Osteoarthritis   . Stress incontinence, male     urge  . Dyslipidemia   . Hernia, hiatal   . Hyperlipidemia   . Hypertension   . DVT (deep venous thrombosis)   . Complication of anesthesia   . PONV (postoperative nausea and vomiting)   . Family history of anesthesia complication     Hx: pt father had " memeory loss"  . Shortness of breath     Hx: of with exertion  . Overactive bladder     Hx: of  . IBS (irritable bowel syndrome)     Hx: of  . Pneumonia 2002    history of    History   Social History  . Marital Status: Married    Spouse Name: N/A    Number of Children: N/A  . Years of Education: N/A   Occupational History  . Not on file.   Social History Main Topics  . Smoking status: Never Smoker   . Smokeless tobacco: Never Used  . Alcohol Use: No  . Drug Use: No  . Sexual Activity: Not on file   Other Topics Concern  . 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 Wrist tendon surgery    . Cts bilateral    . Knee arthroscopy  1988, 1992    bilateral  . Total knee arthroplasty  2005, 2007  . Lumbar left arm  1961  . Tonsillectomy    . Back surgery    . Colonoscopy      Hx: of  . Laminectomy  08/18/2013    DECOMPRESSIVE LAMINECTOMIES  . Lumbar laminectomy/decompression microdiscectomy N/A 08/18/2013    Procedure: Lumbar two-three, lumbar three-four, lumbar four-five  decompressive laminectomies;  Surgeon: [name], MD;  Location: [facility];  Service: Neurosurgery;  Laterality: N/A;    Family History  Problem Relation Age of Onset  . Other Mother 2    CABG  . Heart disease Mother   . Hypertension Mother   . Hyperlipidemia Son     Allergies  Allergen Reactions  . Ciprofloxacin Other (See Comments)    "made me have a weird feeling. Disoriented."   . Sulfamethoxazole     REACTION: rash    Current Outpatient Prescriptions on File Prior to Visit  Medication Sig Dispense Refill  . amLODipine (NORVASC) 5 MG tablet TAKE ONE TABLET BY MOUTH DAILY  90 tablet  4  . aspirin 81 MG tablet Take 81 mg by mouth daily.        Marland Kitchen docusate sodium 100 MG CAPS Take 100 mg by mouth 2 (two) times daily.  60 capsule  0  . doxazosin (CARDURA) 4 MG tablet Take 2 mg by mouth at bedtime.      . gabapentin (NEURONTIN) 300 MG capsule Take 300 mg by mouth as directed.      Marland Kitchen  glipiZIDE (GLUCOTROL XL) 5 MG 24 hr tablet Take 2 tablets by mouth daily  60 tablet  1  . GLIPIZIDE XL 5 MG 24 hr tablet TAKE 2 TABLETS  (10 MG TOTAL) BY MOUTH DAILY  60 tablet  6  . Glucosamine HCl 1500 MG TABS Take by mouth.      Marland Kitchen glucose blood (ONE TOUCH TEST STRIPS) test strip Use to test blood sugar 3 times daily as instructed. Dx code: 250.00  100 each  11  . glucose blood test strip 1 each by Other route 3 (three) times daily as needed. Use as instructed      . metFORMIN (GLUCOPHAGE) 1000 MG tablet TAKE 1 TABLET (1,000 MG TOTAL) BY MOUTH 2 (TWO) TIMES DAILY WITH A MEAL.  180 tablet  3  . Multiple Vitamin (MULTIVITAMIN) tablet Take 1 tablet by mouth daily.      . Multiple Vitamins-Minerals (CENTRUM SILVER PO) Take by mouth.      . Naproxen Sodium (ALEVE) 220 MG CAPS Take 1 capsule by mouth 2 (two) times daily.      Marland Kitchen omeprazole (PRILOSEC) 20 MG capsule Take 1 capsule (20 mg total) by mouth daily.  90 capsule  1  . ONETOUCH DELICA LANCETS 82N MISC 1 each daily.      . simvastatin  (ZOCOR) 20 MG tablet TAKE 1 TABLET (20 MG TOTAL) BY MOUTH EVERY EVENING.  90 tablet  1  . sitaGLIPtin (JANUVIA) 100 MG tablet Take 1 tablet (100 mg total) by mouth daily.  30 tablet  3   No current facility-administered medications on file prior to visit.     patient denies chest pain, shortness of breath, orthopnea. Denies lower extremity edema, abdominal pain, change in appetite, change in bowel movements. Patient denies rashes, musculoskeletal complaints. No other specific complaints in a complete review of systems.   Reviewed vitals  well-developed well-nourished male in no acute distress. HEENT exam atraumatic, normocephalic, neck supple without jugular venous distention. Chest clear to auscultation cardiac exam S1-S2 are regular. Abdominal exam overweight with bowel sounds, soft and nontender. Extremities no edema. Neurologic exam is alert with a broad based, slow gait,    DIABETES MELLITUS, TYPE II Patient's blood sugars are poorly controlled. I have reviewed medications. I will not intervene with medications at this time as he is followed by endocrinology.  HYPERTENSION Adequate control. Continue current medications.  Other and unspecified hyperlipidemia Well controlled. Continue current medications.   Balance- he notes some differences and notes some leg pain. Note spinal stenosis- s/p surgery.  Also reports urinary incontinence

## 2014-02-14 ENCOUNTER — Telehealth: Payer: Self-pay | Admitting: Internal Medicine

## 2014-02-14 NOTE — Telephone Encounter (Signed)
Relevant patient education mailed to patient.  

## 2014-02-16 ENCOUNTER — Telehealth: Payer: Self-pay | Admitting: Internal Medicine

## 2014-02-16 NOTE — Telephone Encounter (Signed)
Relevant patient education mailed to patient.  

## 2014-02-19 ENCOUNTER — Other Ambulatory Visit: Payer: Self-pay | Admitting: *Deleted

## 2014-02-19 MED ORDER — ONETOUCH DELICA LANCETS 33G MISC: Status: DC

## 2014-03-03 ENCOUNTER — Ambulatory Visit (INDEPENDENT_AMBULATORY_CARE_PROVIDER_SITE_OTHER)
Admission: RE | Admit: 2014-03-03 | Discharge: 2014-03-03 | Disposition: A | Discharge status: Home / Self Care | Payer: Medicare Other | Source: Ambulatory Visit | Attending: Internal Medicine | Admitting: Internal Medicine

## 2014-03-03 DIAGNOSIS — R269 Unspecified abnormalities of gait and mobility: Secondary | ICD-10-CM

## 2014-03-05 ENCOUNTER — Telehealth: Payer: Self-pay | Admitting: Internal Medicine

## 2014-03-05 LAB — HM DIABETES FOOT EXAM

## 2014-03-05 NOTE — Telephone Encounter (Signed)
Pt is waiting for ct scan results

## 2014-03-09 ENCOUNTER — Encounter: Payer: Self-pay | Admitting: Internal Medicine

## 2014-03-09 ENCOUNTER — Other Ambulatory Visit: Payer: Self-pay | Admitting: Internal Medicine

## 2014-03-09 ENCOUNTER — Ambulatory Visit (INDEPENDENT_AMBULATORY_CARE_PROVIDER_SITE_OTHER): Payer: Medicare Other | Admitting: Internal Medicine

## 2014-03-09 VITALS — BP 114/58 | HR 64 | Temp 98.2°F | Resp 12 | Wt 161.0 lb

## 2014-03-09 DIAGNOSIS — Z9181 History of falling: Secondary | ICD-10-CM

## 2014-03-09 DIAGNOSIS — R269 Unspecified abnormalities of gait and mobility: Secondary | ICD-10-CM

## 2014-03-09 DIAGNOSIS — E119 Type 2 diabetes mellitus without complications: Secondary | ICD-10-CM

## 2014-03-09 MED ORDER — CYCLOSET 0.8 MG PO TABS: 2.0000 | ORAL_TABLET | Freq: Every day | ORAL | Status: DC

## 2014-03-09 NOTE — Addendum Note (Signed)
Addended by: Philemon Kingdom on: 03/09/2014 12:58 PM   Modules accepted: Orders

## 2014-03-09 NOTE — Progress Notes (Addendum)
Subjective:     Patient ID: [name], male   DOB: [birth year], 80 y.o.   MRN: [id number]  HPI [name] is a very pleasant 80 y.o. man returning for management of DM2, dx 07/2012, uncontrolled, non-insulin-dependent, with complications (peripheral neuropathy). Last visit 3 mo ago.  Last HbA1C was higher: Lab Results  Component Value Date   HGBA1C 8.8* 02/04/2014   HGBA1C 8.5* 10/31/2013   HGBA1C 7.6* 05/27/2013   He is on a regimen of: - Metformin 1000 mg bid - Januvia 100 mg daily - Glipizide XL 10 mg  He is tolerating these well.  Pt again brings a great log, he checks sugars 3-4x a day before and after meals and they are worse c/w last time (reviewed his log): - am: 117-190 >> in last 2 weeks 138-243 (140-160) >> 117-147 >> 121-175 - 2h after b'fast: 184-225 >> 151-163 >> 171-228 - before lunch: 148-178 >> 165-219 >> 107-110 >> 142-184 - 2h after lunch: 154-178 >> n/c >> 161-170 >> 157-241 - dinner: 171 >> 161-265 >> n/c >>and Amlodipine   180-210 - 2h after dinner: 141-174 >> 171-250 - bedtime: 117-158 >> 165-234 >> 104-166 >> 157-236 No lows.   - no CKD. Last BUN/Cr: Lab Results  Component Value Date   BUN 14 02/04/2014   CREATININE 0.7 02/04/2014  Last ACR was normal in 05/2013. - He has peripheral neuropathy. He is taking a B complex vitamin, and has a neuropathy cream. Seeing [name] at the [facility]. Last foot exam 03/05/2014. - Last eye exam 11/17/2013: no Bobby, new glasses ([name]).   I reviewed pt's medications, allergies, PMH, social hx, family hx and no changes required, except as above. Cardura and Amlodipine doses have been halved at last visit with PCP.  Review of Systems Constitutional: no weight changes, no fatigue, no subjective hyperthermia/hypothermia, + poor sleep, + nocturia ~3x a night Eyes: no blurry vision, no xerophthalmia ENT: no sore throat, no dysphagia/odynophagia/hoarseness Cardiovascular: no CP/no SOB/palpitations/leg  swelling Respiratory: no cough/SOB Gastrointestinal: no N/V/D/C Musculoskeletal: no muscle/+ joint aches Skin: no rashes Neuro: + disequilibrium  Objective:   Physical Exam BP 114/58  Pulse 64  Temp(Src) 98.2 F (36.8 C) (Oral)  Resp 12  Wt 161 lb (73.029 kg)  SpO2 98% Wt Readings from Last 3 Encounters:  03/09/14 161 lb (73.029 kg)  02/13/14 167 lb (75.751 kg)  01/09/14 162 lb (73.483 kg)  Constitutional: normal weight, in NAD Eyes: PERRLA, EOMI, no exophthalmos ENT: moist mucous membranes, no thyromegaly, no cervical lymphadenopathy Cardiovascular: RRR, No MRG Respiratory: CTA B Gastrointestinal: abdomen soft, NT, ND, BS+ Skin: moist, warm, no rashes; site of surgery raised, but not erythematous or painful Neurological: no tremor with outstretched hands  Assessment:     1. DM2, non-insulin-dependent, uncontrolled, with complications - target I3J for him: 7-7.5% - peripheral neuropathy  Plan:     1. Diabetes mellitus type 2, with worsening control (had an injecttion in his ankle recently, unclear if steroid) - he is very conscientious about taking his medicines and keeping a good CBG log - sugars worse, though >> we discussed about adding Invokana (I don't think this is a good fit for him since he has nocturia x 3 already, also, he is thin and has problems with disequilibrium); basal insulin (he would like to avoid injections); or Cycloset (we decided for this). - discussed possible SEs of Cycloset - given samples and coupon for Cycloset - referred to nutrition - RTC in  1.5 mo with sugar log - up to date with eye exams and foot exams  - does not have a computer at home so he cannot join my chart

## 2014-03-09 NOTE — Patient Instructions (Signed)
Please continue Januvia, Glipizide XL and Metformin. Start Cycloset first thing in am. Start with 1 tablet a day, and advance to 2 tablets daily after a week. Please return in 1.5 month with your sugar log.

## 2014-03-27 ENCOUNTER — Other Ambulatory Visit: Payer: Self-pay | Admitting: Internal Medicine

## 2014-04-02 ENCOUNTER — Telehealth: Payer: Self-pay

## 2014-04-02 NOTE — Telephone Encounter (Signed)
Relevant patient education mailed to patient.  

## 2014-04-10 ENCOUNTER — Ambulatory Visit: Payer: Medicare Other | Admitting: *Deleted

## 2014-04-15 ENCOUNTER — Telehealth: Payer: Self-pay | Admitting: Internal Medicine

## 2014-04-15 NOTE — Telephone Encounter (Signed)
Message opened in error/kar

## 2014-04-20 ENCOUNTER — Encounter: Payer: Self-pay | Admitting: Internal Medicine

## 2014-04-20 ENCOUNTER — Ambulatory Visit (INDEPENDENT_AMBULATORY_CARE_PROVIDER_SITE_OTHER): Payer: Medicare Other | Admitting: Internal Medicine

## 2014-04-20 VITALS — BP 122/64 | HR 81 | Temp 97.9°F | Ht 70.0 in | Wt 161.0 lb

## 2014-04-20 DIAGNOSIS — E119 Type 2 diabetes mellitus without complications: Secondary | ICD-10-CM

## 2014-04-20 NOTE — Progress Notes (Signed)
Subjective:     Patient ID: [name], male   DOB: 1933/02/10, 80 y.o.   MRN: [id number]  HPI [name] is a very pleasant 80 y.o. man returning for management of DM2, dx 07/2012, uncontrolled, non-insulin-dependent, with complications (peripheral neuropathy). Last visit 1.5 mo ago.  Last HbA1C was higher: Lab Results  Component Value Date   HGBA1C 8.8* 02/04/2014   HGBA1C 8.5* 10/31/2013   HGBA1C 7.6* 05/27/2013   He is on a regimen of: - Metformin 1000 mg bid - Januvia 100 mg daily - Glipizide XL 10 mg  - Cycloset 1.6 mg (added 03/09/14)  Pt again brings a great log, he checks sugars ~2x a day before and after meals and they are much improved (reviewed his log): - am: 117-190 >> in last 2 weeks 138-243 (140-160) >> 117-147 >> 121-175 >> 115-150 - 2h after b'fast: 184-225 >> 151-163 >> 171-228 >>169-176 - before lunch: 148-178 >> 165-219 >> 107-110 >> 142-184 >> 145-175 - 2h after lunch: 154-178 >> n/c >> 161-170 >> 157-241 >> 131-201 - dinner: 171 >> 161-265 >> n/c >> 180-210 >> 111-181 - 2h after dinner: 141-174 >> 171-250 >> 119-170 - bedtime: 117-158 >> 165-234 >> 104-166 >> 157-236 >> 119-132 No lows.   - no CKD. Last BUN/Cr: Lab Results  Component Value Date   BUN 14 02/04/2014   CREATININE 0.7 02/04/2014  Last ACR was normal in 05/2013. - He has peripheral neuropathy. He is taking a B complex vitamin, and has a neuropathy cream. Seeing [name] at the [facility]. Last foot exam 03/05/2014. - Last eye exam 11/17/2013: no DR, new glasses (Dr. Alois Cliche).   I reviewed pt's medications, allergies, PMH, social hx, family hx and no changes required, except as above.   Review of Systems Constitutional: no weight changes, + fatigue, no subjective hyperthermia/hypothermia, + poor sleep, + nocturia ~3x a night Eyes: no blurry vision, no xerophthalmia ENT: no sore throat, no dysphagia/odynophagia/+ hoarseness Cardiovascular: no CP/no SOB/palpitations/+ leg  swelling Respiratory: no cough/SOB Gastrointestinal: no N/V/D/+ C Musculoskeletal: + muscle/+ joint aches Skin: no rashes, + easy bruising Neuro: no tremors  Objective:   Physical Exam BP 122/64  Pulse 81  Temp(Src) 97.9 F (36.6 C) (Oral)  Ht 5\' 10"  (1.778 m)  Wt 161 lb (73.029 kg)  BMI 23.10 kg/m2  SpO2 96% Wt Readings from Last 3 Encounters:  04/20/14 161 lb (73.029 kg)  03/09/14 161 lb (73.029 kg)  02/13/14 167 lb (75.751 kg)  Constitutional: normal weight, in NAD Eyes: PERRLA, EOMI, no exophthalmos ENT: moist mucous membranes, no thyromegaly, no cervical lymphadenopathy Cardiovascular: RRR, No MRG Respiratory: CTA B Gastrointestinal: abdomen soft, NT, ND, BS+ Skin: moist, warm, no rashes; site of surgery raised, but not erythematous or painful Neurological: no tremor with outstretched hands  Assessment:     1. DM2, non-insulin-dependent, uncontrolled, with complications - target C3J for him: 7-7.5% - peripheral neuropathy  Plan:     1. Diabetes mellitus type 2, with much improving control after adding Cycloset (affordable - 35$ per mo, no SEs from it) and improving his diet (less snacking) - he is very conscientious about taking his medicines and keeping a good CBG log >> continue checking CBGs 2x a day - sugars great >> will continue current regimen - referred to nutrition >> will have appt next mo - up to date with eye exams and foot exams  - will check A1c in 2 weeks as it is not 3 mo since  prior yet - does not have a computer at home so he cannot join my chart - RTC in 3 mo with sugar log

## 2014-04-29 ENCOUNTER — Other Ambulatory Visit: Payer: Self-pay | Admitting: Internal Medicine

## 2014-05-01 ENCOUNTER — Encounter: Payer: Self-pay | Admitting: *Deleted

## 2014-05-18 ENCOUNTER — Encounter: Payer: Medicare Other | Attending: Internal Medicine | Admitting: *Deleted

## 2014-05-18 ENCOUNTER — Other Ambulatory Visit (INDEPENDENT_AMBULATORY_CARE_PROVIDER_SITE_OTHER): Payer: Medicare Other

## 2014-05-18 ENCOUNTER — Encounter: Payer: Self-pay | Admitting: *Deleted

## 2014-05-18 VITALS — Ht 70.0 in | Wt 160.2 lb

## 2014-05-18 DIAGNOSIS — Z713 Dietary counseling and surveillance: Secondary | ICD-10-CM | POA: Insufficient documentation

## 2014-05-18 DIAGNOSIS — E119 Type 2 diabetes mellitus without complications: Secondary | ICD-10-CM

## 2014-05-18 LAB — HEMOGLOBIN A1C: HEMOGLOBIN A1C: 9.7 % — AB (ref 4.6–6.5)

## 2014-05-18 NOTE — Progress Notes (Signed)
Medical Nutrition Therapy:  Appt start time: 0930 end time:  1030.  Assessment:  Patient here today for diabetes education. He reports being diagnosed with diabetes a year ago, but he spoke with an RD 5-6 years ago when he had pre-diabetes. He is here with his wife today. They have retained a fair amount of knowledge from that meeting, but would like a refresher. Patient was doing well keeping his blood glucose low until 6 months ago. Concentrations fluctuate, sometimes without them understanding why. Overall, intake is good. Physical activity is limited by knee and joint pain. Patient has maintained weight at around 160 pounds for several years. HgbA1c 9.7  MEDICATIONS: Glipizide, Januvia, Metformin, Cycloset   DIETARY INTAKE:   Usual eating pattern includes 3 meals and 3 snacks per day.  24-hr recall:  B ( AM): Poached egg, sausage, toast, fruit, coffee (stevia, splenda, half and half), water  Snk ( AM): Fruit  L ( PM): Cottage cheese, fruit, raw vegetables, crackers, water Snk ( PM): Tortilla chips with flax seed, cashews, Nabs crackers D ( PM): Meat, beans, stirfry vegetables/salad Snk ( PM): 1/2 apple, yogurt, tortilla chips Beverages: Diet soda/lemonade, water  Usual physical activity: Limited, some walking  Estimated energy needs: 1900 calories 214 g carbohydrates 119 g protein 63 g fat  Progress Towards Goal(s):  In progress.   Nutritional Diagnosis:  NB-1.1 Food and nutrition-related knowledge deficit As related to diabetes.  As evidenced by no recent education.    Intervention:  Nutrition counseling. We discussed basic carb counting, including foods with carbs, label reading, portion size, and meal planning.   Goals:  1. 3 (+/- 1 serving) servings carbohydrates per meal. 1 serving at snacks.  2. Monitor portion size of carb foods.  3. Read food label.  4. Limit intake of sweets/sweetened drinks 5. Include a good source of protein at all meals.   Handouts given during  visit include:  Choose your meal, diabetes handout  Monitoring/Evaluation:  Dietary intake, exercise, blood glucose, and body weight prn.

## 2014-06-16 ENCOUNTER — Other Ambulatory Visit: Payer: Self-pay | Admitting: Internal Medicine

## 2014-06-25 ENCOUNTER — Other Ambulatory Visit: Payer: Self-pay | Admitting: Internal Medicine

## 2014-07-13 ENCOUNTER — Ambulatory Visit (INDEPENDENT_AMBULATORY_CARE_PROVIDER_SITE_OTHER): Payer: Medicare Other | Admitting: Internal Medicine

## 2014-07-13 ENCOUNTER — Encounter: Payer: Self-pay | Admitting: Internal Medicine

## 2014-07-13 VITALS — BP 116/60 | HR 89 | Temp 98.0°F | Ht 70.0 in | Wt 160.0 lb

## 2014-07-13 DIAGNOSIS — G909 Disorder of the autonomic nervous system, unspecified: Secondary | ICD-10-CM

## 2014-07-13 DIAGNOSIS — M7989 Other specified soft tissue disorders: Secondary | ICD-10-CM

## 2014-07-13 DIAGNOSIS — IMO0002 Reserved for concepts with insufficient information to code with codable children: Secondary | ICD-10-CM

## 2014-07-13 DIAGNOSIS — E1149 Type 2 diabetes mellitus with other diabetic neurological complication: Secondary | ICD-10-CM

## 2014-07-13 DIAGNOSIS — E1143 Type 2 diabetes mellitus with diabetic autonomic (poly)neuropathy: Secondary | ICD-10-CM

## 2014-07-13 DIAGNOSIS — E1165 Type 2 diabetes mellitus with hyperglycemia: Principal | ICD-10-CM

## 2014-07-13 MED ORDER — INSULIN PEN NEEDLE 31G X 5 MM MISC: Status: DC

## 2014-07-13 MED ORDER — INSULIN DETEMIR 100 UNIT/ML FLEXPEN: 15.0000 [IU] | PEN_INJECTOR | Freq: Every day | SUBCUTANEOUS | Status: DC

## 2014-07-13 NOTE — Progress Notes (Addendum)
Subjective:     Patient ID: [name], male   DOB: [birth year], 80 y.o.   MRN: [id number]  HPI [name] is a very pleasant 80 y.o. man returning for management of DM2, dx 07/2012, uncontrolled, non-insulin-dependent, with complications (peripheral neuropathy). Last visit 3 mo ago.  Last HbA1C was higher: Lab Results  Component Value Date   HGBA1C 9.7* 05/18/2014   HGBA1C 8.8* 02/04/2014   HGBA1C 8.5* 10/31/2013   He is on a regimen of: - Metformin 1000 mg bid - Januvia 100 mg daily - Glipizide XL 10 mg  - Cycloset 1.6 mg (added 03/09/14)  Pt again brings a great log, he checks sugars ~2-4x a day before and after meals and they are much worse (reviewed his log): - am: 117-190 >> in last 2 weeks 138-243 (140-160) >> 117-147 >> 121-175 >> 115-150 >> 150-272 - 2h after b'fast: 184-225 >> 151-163 >> 171-228 >>169-176 >> 251-302 - before lunch: 148-178 >> 165-219 >> 107-110 >> 142-184 >> 145-175 >> 246-324 - 2h after lunch: 154-178 >> n/c >> 161-170 >> 157-241 >> 131-201 >> 235-350 - dinner: 171 >> 161-265 >> n/c >> 180-210 >> 111-181 >> 177-290 - 2h after dinner: 141-174 >> 171-250 >> 119-170 >> 219-301 - bedtime: 117-158 >> 165-234 >> 104-166 >> 157-236 >> 119-132 >> 220-325 No lows.   - no CKD. Last BUN/Cr: Lab Results  Component Value Date   BUN 14 02/04/2014   CREATININE 0.7 02/04/2014  Last ACR was normal in 05/2013. - He has peripheral neuropathy. He is taking a B complex vitamin, and has a neuropathy cream. Seeing [name] at the [facility]. Last foot exam 03/05/2014. - Last eye exam 11/17/2013: no Bobby, new glasses ([name]).   I reviewed pt's medications, allergies, PMH, social hx, family hx and no changes required, except as above.   Review of Systems Constitutional: no weight changes, + fatigue, no subjective hyperthermia/hypothermia, + nocturia Eyes: no blurry vision, no xerophthalmia ENT: no sore throat, no dysphagia/odynophagia/+  hoarseness Cardiovascular: no CP/no SOB/palpitations/+ leg swelling Respiratory: no cough/SOB Gastrointestinal: no N/V/D/C Musculoskeletal: + muscle/+ joint aches Skin: no rashes, + easy bruising Neuro: no tremors, but c/o more disequilibrium lately (walks with a cane)  Objective:   Physical Exam BP 116/60  Pulse 89  Temp(Src) 98 F (36.7 C) (Oral)  Ht 5\' 10"  (1.778 m)  Wt 160 lb (72.576 kg)  BMI 22.96 kg/m2 Wt Readings from Last 3 Encounters:  07/13/14 160 lb (72.576 kg)  05/18/14 160 lb 3.2 oz (72.666 kg)  04/20/14 161 lb (73.029 kg)  Constitutional: normal weight, in NAD Eyes: PERRLA, EOMI, no exophthalmos ENT: moist mucous membranes, no thyromegaly, no cervical lymphadenopathy Cardiovascular: RRR, No MRG, + leg swelling Respiratory: CTA B Gastrointestinal: abdomen soft, NT, ND, BS+ Skin: moist, warm, no rashes; site of surgery raised, but not erythematous or painful Neurological: no tremor with outstretched hands  Assessment:     1. DM2, non-insulin-dependent, uncontrolled, with complications - target O7F for him: 7-7.5% - peripheral neuropathy  2. Leg swelling  Plan:     1. Diabetes mellitus type 2, with much worsened control in last month. CBG checked in office: 287. - he is very conscientious about taking his medicines and keeping a good CBG log >> continue checking CBGs 2x a day - I advised him to: Patient Instructions  Please continue: - Metformin 1000 mg 2x a day - Januvia 100 mg daily  - Glipizide XL 10 mg  - Cycloset  1.6 mg daily in am until you run out >> do not refill it afterwards Start Levemir 15 units at bedtime. If sugars in am not <150 in 4 days, increase to 18 units. If sugars in am not <150 in 4 days, increase to 20 units. Please return in 1 month with your sugar log.  - up to date with eye exams and foot exams  - does not have a computer at home so he cannot join my chart - RTC in 1 mo with sugar log  2. Bilateral leg swelling - no CP/SOB  associated - reviewed med list >> possible culprit: Norvasc >> advised to d/w PCP about possibly changing this to another anti-HT-ive - he also has an appt with cardiology [name] coming up, but only in 1 mo  No visits with results within 1 Day(s) from this visit. Latest known visit with results is:  Appointment on 05/18/2014  Component Date Value Ref Range Status  . Hemoglobin A1C 05/18/2014 9.7* 4.6 - 6.5 % Final   Glycemic Control Guidelines for People with Diabetes:Non Diabetic:  <6%Goal of Therapy: <7%Additional Action Suggested:  >8%    HbA1c better bu sugars recently worse. See plan above.

## 2014-07-13 NOTE — Patient Instructions (Addendum)
Please continue: - Metformin 1000 mg 2x a day - Januvia 100 mg daily  - Glipizide XL 10 mg  - Cycloset 1.6 mg daily in am until you run out >> do not refill it afterwards Start Levemir 15 units at bedtime. If sugars in am not <150 in 4 days, increase to 18 units. If sugars in am not <150 in 4 days, increase to 20 units.  Please return in 1 month with your sugar log.

## 2014-07-21 ENCOUNTER — Ambulatory Visit: Payer: Medicare Other | Admitting: Internal Medicine

## 2014-07-23 ENCOUNTER — Other Ambulatory Visit: Payer: Self-pay | Admitting: Internal Medicine

## 2014-08-14 ENCOUNTER — Other Ambulatory Visit: Payer: Medicare Other

## 2014-08-19 ENCOUNTER — Ambulatory Visit (INDEPENDENT_AMBULATORY_CARE_PROVIDER_SITE_OTHER): Payer: Medicare Other | Admitting: Cardiology

## 2014-08-19 ENCOUNTER — Encounter: Payer: Self-pay | Admitting: Cardiology

## 2014-08-19 ENCOUNTER — Encounter: Payer: Self-pay | Admitting: *Deleted

## 2014-08-19 ENCOUNTER — Other Ambulatory Visit: Payer: Self-pay | Admitting: Cardiology

## 2014-08-19 VITALS — BP 120/58 | HR 56 | Ht 70.0 in | Wt 170.8 lb

## 2014-08-19 DIAGNOSIS — I10 Essential (primary) hypertension: Secondary | ICD-10-CM

## 2014-08-19 DIAGNOSIS — E785 Hyperlipidemia, unspecified: Secondary | ICD-10-CM

## 2014-08-19 DIAGNOSIS — R609 Edema, unspecified: Secondary | ICD-10-CM

## 2014-08-19 DIAGNOSIS — I208 Other forms of angina pectoris: Secondary | ICD-10-CM

## 2014-08-19 DIAGNOSIS — R011 Cardiac murmur, unspecified: Secondary | ICD-10-CM

## 2014-08-19 DIAGNOSIS — R072 Precordial pain: Secondary | ICD-10-CM

## 2014-08-19 HISTORY — DX: Edema, unspecified: R60.9

## 2014-08-19 MED ORDER — FUROSEMIDE 20 MG PO TABS: 20.0000 mg | ORAL_TABLET | Freq: Every day | ORAL | Status: DC

## 2014-08-19 NOTE — Assessment & Plan Note (Signed)
The patient presents with complaints of increased dyspnea on exertion, chest pain with exertion and bilateral lower extremity edema. I am concerned about the possibility of coronary artery disease given his exertional chest pain. Plan right and left cardiac catheterization. The risks and benefits were discussed and he agrees to proceed. Check echocardiogram for LV function and also mitral regurgitation murmur. Hold Glucophage for 48 hours following procedure. Continue aspirin.

## 2014-08-19 NOTE — Progress Notes (Signed)
HPI: 80 yo male for evaluation of edema. Echo 2006 showed normal LV function. Nuclear study 2006 showed normal LV function and no ischemia. ABIs 3/14 normal. Patient states that for the past one year he has had increased dyspnea on exertion. No orthopnea or PND. He also notes exertional chest discomfort relieved with rest. In the past 6 months he has had progressive increase in lower extremity edema bilaterally. Cardiology asked to evaluate.  Current Outpatient Prescriptions  Medication Sig Dispense Refill  . amLODipine (NORVASC) 5 MG tablet Take 1/2 tablet daily      . aspirin 81 MG tablet Take 81 mg by mouth daily.        Marland Kitchen doxazosin (CARDURA) 4 MG tablet 1/2 tablet daily      . GLIPIZIDE XL 5 MG 24 hr tablet TAKE 2 TABLETS  (10 MG TOTAL) BY MOUTH DAILY  60 tablet  5  . glucose blood (ONE TOUCH TEST STRIPS) test strip Use to test blood sugar 3 times daily as instructed. Dx code: 250.00  100 each  11  . Insulin Detemir (LEVEMIR) 100 UNIT/ML Pen Inject 15 Units into the skin daily at 10 pm.  15 mL  3  . Insulin Pen Needle (FIFTY50 PEN NEEDLES) 31G X 5 MM MISC Use once a day  50 each  3  . JANUVIA 100 MG tablet TAKE 1 TABLET (100 MG TOTAL) BY MOUTH DAILY.  30 tablet  2  . metFORMIN (GLUCOPHAGE) 1000 MG tablet TAKE 1 TABLET (1,000 MG TOTAL) BY MOUTH 2 (TWO) TIMES DAILY WITH A MEAL.  180 tablet  3  . Multiple Vitamins-Minerals (CENTRUM SILVER PO) Take by mouth.      . NON FORMULARY Neuremedy once daily      . omeprazole (PRILOSEC) 20 MG capsule TAKE 1 CAPSULE (20 MG TOTAL) BY MOUTH DAILY.  90 capsule  0  . ONETOUCH DELICA LANCETS 09Q MISC Use to test blood sugar 2 times daily as instructed. Dx code: 250.00  100 each  11   No current facility-administered medications for this visit.    Allergies  Allergen Reactions  . Ciprofloxacin Other (See Comments)    "made me have a weird feeling. Disoriented."   . Sulfamethoxazole     REACTION: rash    Past Medical History  Diagnosis Date    . Diabetes mellitus     type 2  . Diverticulosis of colon   . GERD (gastroesophageal reflux disease)   . Osteoarthritis   . Stress incontinence, male     urge  . Dyslipidemia   . Hernia, hiatal   . Hyperlipidemia   . Hypertension   . DVT (deep venous thrombosis)   . Overactive bladder     Hx: of  . IBS (irritable bowel syndrome)     Hx: of  . Pneumonia 2002    history of    Past Surgical History  Procedure Laterality Date  . Wrist tendon surgery    . Cts bilateral    . Knee arthroscopy  1988, 1992    bilateral  . Total knee arthroplasty  2005, 2007  . Lumbar left arm  1961  . Tonsillectomy    . Back surgery    . Colonoscopy      Hx: of  . Laminectomy  08/18/2013    DECOMPRESSIVE LAMINECTOMIES  . Lumbar laminectomy/decompression microdiscectomy N/A 08/18/2013    Procedure: Lumbar two-three, lumbar three-four, lumbar four-five decompressive laminectomies;  Surgeon: [name], MD;  Location:  [facility];  Service: Neurosurgery;  Laterality: N/A;    History   Social History  . Marital Status: Married    Spouse Name: N/A    Number of Children: 2  . Years of Education: N/A   Occupational History  . Not on file.   Social History Main Topics  . Smoking status: Never Smoker   . Smokeless tobacco: Never Used  . Alcohol Use: No  . Drug Use: No  . Sexual Activity: Not on file   Other Topics Concern  . 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 Other Mother 60    CABG  . Heart disease Mother   . Hypertension Mother   . Hyperlipidemia Son     ROS: Patient complains of dizziness and weakness as well as unsteady gait but no fevers or chills, productive cough, hemoptysis, dysphasia, odynophagia, melena, hematochezia, dysuria, hematuria, rash, seizure activity, orthopnea, PND, claudication. Remaining  systems are negative.  Physical Exam:   Blood pressure 120/58, pulse 56, height 5\' 10"  (1.778 m), weight 170 lb 12.8 oz (77.474 kg).  General:  Well developed/well nourished in NAD Skin warm/dry Patient not depressed No peripheral clubbing Back-normal HEENT-normal/normal eyelids Neck supple/normal carotid upstroke bilaterally; no bruits; no JVD; no thyromegaly chest - CTA/ normal expansion CV - RRR/normal S1 and S2; no rubs or gallops;  PMI nondisplaced; 2/6 systolic murmur apex. Abdomen -NT/ND, no HSM, no mass, + bowel sounds, no bruit 2+ femoral pulses, no bruits Ext-1-2+ edema, chords, 2+ DP Neuro-grossly nonfocal  ECG Sinus bradycardia at a rate of 57. Left axis deviation. No ST changes.

## 2014-08-19 NOTE — Assessment & Plan Note (Addendum)
Patient with significant edema. Echocardiogram to assess LV function and right heart catheterization for pulmonary pressures/filling pressures. Add Lasix 20 mg daily. Check potassium and renal function 5 days after initiating. Hold Lasix the day prior to and the day of the procedure.

## 2014-08-19 NOTE — Patient Instructions (Addendum)
Your physician has requested that you have an echocardiogram 08/26/14 10:30 AT [place] OFF[facility] Echocardiography is a painless test that uses sound waves to create images of your heart. It provides your doctor with information about the size and shape of your heart and how well your heart's chambers and valves are working. This procedure takes approximately one hour. There are no restrictions for this procedure.  Your physician has requested that you have a cardiac catheterization 08/31/14 @ 9 AM . Cardiac catheterization is used to diagnose and/or treat various heart conditions. Doctors may recommend this procedure for a number of different reasons. The most common reason is to evaluate chest pain. Chest pain can be a symptom of coronary artery disease (CAD), and cardiac catheterization can show whether plaque is narrowing or blocking your heart's arteries. This procedure is also used to evaluate the valves, as well as measure the blood flow and oxygen levels in different parts of your heart. For further information please visit [email or web address]. Please follow instruction sheet, as given.  Your physician recommends that you return for lab work 08/26/14   START LASIX 20 MG DAILY

## 2014-08-19 NOTE — Assessment & Plan Note (Signed)
Continue present blood pressure medications. 

## 2014-08-19 NOTE — Assessment & Plan Note (Signed)
Management per primary care. 

## 2014-08-21 ENCOUNTER — Ambulatory Visit: Payer: Medicare Other | Admitting: Internal Medicine

## 2014-08-21 ENCOUNTER — Ambulatory Visit (INDEPENDENT_AMBULATORY_CARE_PROVIDER_SITE_OTHER): Payer: Medicare Other

## 2014-08-21 DIAGNOSIS — Z23 Encounter for immunization: Secondary | ICD-10-CM

## 2014-08-25 ENCOUNTER — Encounter: Payer: Self-pay | Admitting: Internal Medicine

## 2014-08-25 ENCOUNTER — Ambulatory Visit (INDEPENDENT_AMBULATORY_CARE_PROVIDER_SITE_OTHER): Payer: Medicare Other | Admitting: *Deleted

## 2014-08-25 ENCOUNTER — Ambulatory Visit (INDEPENDENT_AMBULATORY_CARE_PROVIDER_SITE_OTHER): Payer: Medicare Other | Admitting: Internal Medicine

## 2014-08-25 VITALS — BP 122/62 | HR 65 | Temp 97.7°F | Resp 12 | Wt 171.0 lb

## 2014-08-25 DIAGNOSIS — E119 Type 2 diabetes mellitus without complications: Secondary | ICD-10-CM

## 2014-08-25 DIAGNOSIS — Z23 Encounter for immunization: Secondary | ICD-10-CM

## 2014-08-25 LAB — HEMOGLOBIN A1C: HEMOGLOBIN A1C: 9.7 % — AB (ref 4.6–6.5)

## 2014-08-25 MED ORDER — INSULIN DETEMIR 100 UNIT/ML FLEXPEN: 13.0000 [IU] | PEN_INJECTOR | Freq: Every day | SUBCUTANEOUS | Status: DC

## 2014-08-25 MED ORDER — GLIPIZIDE ER 5 MG PO TB24: 10.0000 mg | ORAL_TABLET | Freq: Every day | ORAL | Status: DC

## 2014-08-25 MED ORDER — SITAGLIPTIN PHOSPHATE 100 MG PO TABS: 100.0000 mg | ORAL_TABLET | Freq: Every day | ORAL | Status: DC

## 2014-08-25 NOTE — Patient Instructions (Signed)
Please continue: - Metformin 1000 mg 2x a day - Januvia 100 mg daily in am - Glipizide XL 10 mg in am - Stop Cycloset 1.6 mg after you run out - Decrease Levemir to 13 units and move it to am  Please return in 1.5 month with your sugar log.   Please stop at the lab.

## 2014-08-25 NOTE — Progress Notes (Signed)
Subjective:     Patient ID: [name], male   DOB: Mar 14, 1933, 80 y.o.   MRN: [id number]  HPI [name] is a very pleasant 80 y.o. man returning for management of DM2, dx 07/2012, uncontrolled, insulin-dependent, with complications (peripheral neuropathy). Last visit 1.5 mo ago.  He will have a heart catheterization on 09/03/2014.   Last HbA1C was higher: Lab Results  Component Value Date   HGBA1C 9.7* 05/18/2014   HGBA1C 8.8* 02/04/2014   HGBA1C 8.5* 10/31/2013   He is on a regimen of: - Metformin 1000 mg bid - Januvia 100 mg daily - Glipizide XL 10 mg  - Cycloset 1.6 mg  - Levemir 15 units added 07/2014.  Pt again brings a great log, he checks sugars ~2-4x a day before and after meals and they are improved (reviewed his log) but has lows in am and still highs as Bobby day goes by: - am: 117-190 >> in last 2 weeks 138-243 (140-160) >> 117-147 >> 121-175 >> 115-150 >> 150-272 >> 54, 62-121 - 2h after b'fast: 184-225 >> 151-163 >> 171-228 >>169-176 >> 251-302 >> 123-179 - before lunch: 148-178 >> 165-219 >> 107-110 >> 142-184 >> 145-175 >> 246-324 >> 108-165, 186, 207 - 2h after lunch: 154-178 >> n/c >> 161-170 >> 157-241 >> 131-201 >> 235-350 >> 102, 179-215 - dinner: 171 >> 161-265 >> n/c >> 180-210 >> 111-181 >> 177-290 >> 152, 210 - 2h after dinner: 141-174 >> 171-250 >> 119-170 >> 219-301 >> 137, 162-250 - bedtime: 117-158 >> 165-234 >> 104-166 >> 157-236 >> 119-132 >> 220-325 >> 142-250 Has lows in am >> 47, but 50 an 60s.   - no CKD. Last BUN/Cr: Lab Results  Component Value Date   BUN 14 02/04/2014   CREATININE 0.7 02/04/2014  Last ACR was normal in 05/2013. - He has peripheral neuropathy. He is taking a B complex vitamin, and has a neuropathy cream. Seeing [name] at [name]. Last foot exam 03/05/2014. - Last eye exam 11/17/2013: no Bobby, new glasses ([name]).   I reviewed pt's medications, allergies, PMH, social hx, family hx and no changes required,  except as above.   Review of Systems Constitutional: no weight changes, + fatigue, no subjective hyperthermia/hypothermia, + nocturia Eyes: no blurry vision, no xerophthalmia ENT: no sore throat, no dysphagia/odynophagia/+ hoarseness Cardiovascular: no CP/+ SOB/no palpitations/+ leg swelling Respiratory: no cough/+ SOB Gastrointestinal: no N/V/D/C Musculoskeletal: + muscle/+ joint aches Skin: no rash Neuro: no tremors, but disequilibrium (walks with a cane)  Objective:   Physical Exam BP 122/62  Pulse 65  Temp(Src) 97.7 F (36.5 C) (Oral)  Resp 12  Wt 171 lb (77.565 kg)  SpO2 98% Body mass index is 24.54 kg/(m^2).  Wt Readings from Last 3 Encounters:  08/25/14 171 lb (77.565 kg)  08/19/14 170 lb 12.8 oz (77.474 kg)  07/13/14 160 lb (72.576 kg)  Constitutional: normal weight, in NAD Eyes: PERRLA, EOMI, no exophthalmos ENT: moist mucous membranes, no thyromegaly, no cervical lymphadenopathy Cardiovascular: RRR, No MRG, + leg swelling Respiratory: CTA B Gastrointestinal: abdomen soft, NT, ND, BS+ Skin: moist, warm, no rashes Neurological: no tremor with outstretched hands  Assessment:     1. DM2, non-insulin-dependent, uncontrolled, with complications - target T0V for him: 7-7.5% - peripheral neuropathy  Plan:     1. Diabetes mellitus type 2, with much worsened control at last visit, after which we added Levemir >> sugars better to Bobby point of lows in am.  -  he is very conscientious about taking his medicines and keeping a good CBG log >> continue checking CBGs 2x a day - I advised him to: Patient Instructions  Please continue: - Metformin 1000 mg 2x a day - Januvia 100 mg daily in am - Glipizide XL 10 mg in am - Stop Cycloset 1.6 mg after you run out - Decrease Levemir to 13 units and move it to am Please return in 1.5 month with your sugar log.  Please stop at Bobby lab. - up to date with eye exams and foot exams  - will check HbA1c - refilled Glipizide and  Januvia - will hold Metformin and Glipizie for his cath - does not have a computer at home so he cannot join my chart - RTC in 1.5 mo with sugar log  Office Visit on 08/25/2014  Component Date Value Ref Range Status  . Hemoglobin A1C 08/25/2014 9.7* 4.6 - 6.5 % Final   Glycemic Control Guidelines for People with Diabetes:Non Diabetic:  <6%Goal of Therapy: <7%Additional Action Suggested:  >8%    HbA1c same as before. I believe Bobby impact of adding insulin would be evident in Bobby next HbA1c.

## 2014-08-26 ENCOUNTER — Ambulatory Visit (HOSPITAL_COMMUNITY): Payer: Medicare Other | Attending: Cardiology | Admitting: Cardiology

## 2014-08-26 ENCOUNTER — Other Ambulatory Visit (INDEPENDENT_AMBULATORY_CARE_PROVIDER_SITE_OTHER): Payer: Medicare Other

## 2014-08-26 DIAGNOSIS — R011 Cardiac murmur, unspecified: Secondary | ICD-10-CM | POA: Diagnosis present

## 2014-08-26 DIAGNOSIS — R079 Chest pain, unspecified: Secondary | ICD-10-CM

## 2014-08-26 DIAGNOSIS — R609 Edema, unspecified: Secondary | ICD-10-CM

## 2014-08-26 DIAGNOSIS — I10 Essential (primary) hypertension: Secondary | ICD-10-CM

## 2014-08-26 DIAGNOSIS — E785 Hyperlipidemia, unspecified: Secondary | ICD-10-CM | POA: Diagnosis not present

## 2014-08-26 DIAGNOSIS — E119 Type 2 diabetes mellitus without complications: Secondary | ICD-10-CM | POA: Insufficient documentation

## 2014-08-26 DIAGNOSIS — R0602 Shortness of breath: Secondary | ICD-10-CM

## 2014-08-26 DIAGNOSIS — R072 Precordial pain: Secondary | ICD-10-CM

## 2014-08-26 LAB — CBC WITH DIFFERENTIAL/PLATELET
BASOS PCT: 0.4 % (ref 0.0–3.0)
Basophils Absolute: 0 10*3/uL (ref 0.0–0.1)
EOS PCT: 1 % (ref 0.0–5.0)
Eosinophils Absolute: 0.1 10*3/uL (ref 0.0–0.7)
HCT: 35.4 % — ABNORMAL LOW (ref 39.0–52.0)
HEMOGLOBIN: 11.9 g/dL — AB (ref 13.0–17.0)
LYMPHS PCT: 25 % (ref 12.0–46.0)
Lymphs Abs: 1.4 10*3/uL (ref 0.7–4.0)
MCHC: 33.5 g/dL (ref 30.0–36.0)
MCV: 89.2 fl (ref 78.0–100.0)
MONOS PCT: 8.5 % (ref 3.0–12.0)
Monocytes Absolute: 0.5 10*3/uL (ref 0.1–1.0)
Neutro Abs: 3.7 10*3/uL (ref 1.4–7.7)
Neutrophils Relative %: 65.1 % (ref 43.0–77.0)
Platelets: 132 10*3/uL — ABNORMAL LOW (ref 150.0–400.0)
RBC: 3.97 Mil/uL — AB (ref 4.22–5.81)
RDW: 13.6 % (ref 11.5–15.5)
WBC: 5.8 10*3/uL (ref 4.0–10.5)

## 2014-08-26 NOTE — Progress Notes (Signed)
Echo performed. 

## 2014-08-27 ENCOUNTER — Other Ambulatory Visit: Payer: Self-pay | Admitting: Cardiology

## 2014-08-27 LAB — PROTIME-INR
INR: 1 ratio (ref 0.8–1.0)
Prothrombin Time: 11.4 s (ref 9.6–13.1)

## 2014-08-27 LAB — BASIC METABOLIC PANEL
BUN: 15 mg/dL (ref 6–23)
CALCIUM: 10.1 mg/dL (ref 8.4–10.5)
CO2: 29 mEq/L (ref 19–32)
Chloride: 100 mEq/L (ref 96–112)
Creatinine, Ser: 0.8 mg/dL (ref 0.4–1.5)
GFR: 95.02 mL/min (ref >60.00)
Glucose, Bld: 318 mg/dL — ABNORMAL HIGH (ref 70–99)
POTASSIUM: 5 meq/L (ref 3.5–5.1)
SODIUM: 138 meq/L (ref 135–145)

## 2014-09-01 ENCOUNTER — Telehealth: Payer: Self-pay | Admitting: Internal Medicine

## 2014-09-01 NOTE — Telephone Encounter (Signed)
Please stop Metformin and Glipizide that day, but can take Januvia and Cycloset. I would skip the Levemir that morning since he is now taking it in am.

## 2014-09-01 NOTE — Telephone Encounter (Signed)
Patient stated that he is going to Short Stay Thursday at [facility] cone he want to know should he stop taking his insulin. Please advise

## 2014-09-01 NOTE — Telephone Encounter (Signed)
Please read note below and advise.  

## 2014-09-01 NOTE — Telephone Encounter (Signed)
Called pt and advised him per Dr Arman Filter note. Pt understood.

## 2014-09-02 ENCOUNTER — Encounter (HOSPITAL_COMMUNITY): Payer: Self-pay | Admitting: Pharmacy Technician

## 2014-09-03 ENCOUNTER — Encounter (HOSPITAL_COMMUNITY)
Admission: RE | Disposition: A | Discharge status: Home / Self Care | Payer: Self-pay | Source: Ambulatory Visit | Attending: Cardiovascular Disease

## 2014-09-03 ENCOUNTER — Ambulatory Visit (HOSPITAL_COMMUNITY)
Admission: RE | Admit: 2014-09-03 | Discharge: 2014-09-03 | Disposition: A | Discharge status: Home / Self Care | Payer: Medicare Other | Source: Ambulatory Visit | Attending: Cardiovascular Disease | Admitting: Cardiovascular Disease

## 2014-09-03 ENCOUNTER — Encounter: Payer: Self-pay | Admitting: *Deleted

## 2014-09-03 DIAGNOSIS — Z7982 Long term (current) use of aspirin: Secondary | ICD-10-CM | POA: Insufficient documentation

## 2014-09-03 DIAGNOSIS — E785 Hyperlipidemia, unspecified: Secondary | ICD-10-CM | POA: Insufficient documentation

## 2014-09-03 DIAGNOSIS — E119 Type 2 diabetes mellitus without complications: Secondary | ICD-10-CM | POA: Insufficient documentation

## 2014-09-03 DIAGNOSIS — I10 Essential (primary) hypertension: Secondary | ICD-10-CM | POA: Insufficient documentation

## 2014-09-03 DIAGNOSIS — Z96659 Presence of unspecified artificial knee joint: Secondary | ICD-10-CM | POA: Diagnosis not present

## 2014-09-03 DIAGNOSIS — Z86718 Personal history of other venous thrombosis and embolism: Secondary | ICD-10-CM | POA: Diagnosis not present

## 2014-09-03 DIAGNOSIS — M1990 Unspecified osteoarthritis, unspecified site: Secondary | ICD-10-CM | POA: Insufficient documentation

## 2014-09-03 DIAGNOSIS — R0602 Shortness of breath: Secondary | ICD-10-CM | POA: Diagnosis present

## 2014-09-03 DIAGNOSIS — I209 Angina pectoris, unspecified: Secondary | ICD-10-CM | POA: Diagnosis present

## 2014-09-03 DIAGNOSIS — K219 Gastro-esophageal reflux disease without esophagitis: Secondary | ICD-10-CM | POA: Diagnosis not present

## 2014-09-03 DIAGNOSIS — Z794 Long term (current) use of insulin: Secondary | ICD-10-CM | POA: Insufficient documentation

## 2014-09-03 DIAGNOSIS — I208 Other forms of angina pectoris: Secondary | ICD-10-CM

## 2014-09-03 DIAGNOSIS — I2510 Atherosclerotic heart disease of native coronary artery without angina pectoris: Secondary | ICD-10-CM | POA: Insufficient documentation

## 2014-09-03 HISTORY — PX: LEFT AND RIGHT HEART CATHETERIZATION WITH CORONARY ANGIOGRAM: SHX5449

## 2014-09-03 LAB — POCT I-STAT 3, VENOUS BLOOD GAS (G3P V)
ACID-BASE EXCESS: 2 mmol/L (ref 0.0–2.0)
BICARBONATE: 27.9 meq/L — AB (ref 20.0–24.0)
O2 SAT: 73 %
PCO2 VEN: 48.9 mmHg (ref 45.0–50.0)
TCO2: 29 mmol/L (ref 0–100)
pH, Ven: 7.364 — ABNORMAL HIGH (ref 7.250–7.300)
pO2, Ven: 40 mmHg (ref 30.0–45.0)

## 2014-09-03 LAB — GLUCOSE, CAPILLARY
Glucose-Capillary: 187 mg/dL — ABNORMAL HIGH (ref 70–99)
Glucose-Capillary: 183 mg/dL — ABNORMAL HIGH (ref 70–99)

## 2014-09-03 LAB — POCT I-STAT 3, ART BLOOD GAS (G3+)
ACID-BASE EXCESS: 3 mmol/L — AB (ref 0.0–2.0)
Bicarbonate: 28.7 mEq/L — ABNORMAL HIGH (ref 20.0–24.0)
O2 SAT: 96 %
PCO2 ART: 49.5 mmHg — AB (ref 35.0–45.0)
TCO2: 30 mmol/L (ref 0–100)
pH, Arterial: 7.371 (ref 7.350–7.450)
pO2, Arterial: 86 mmHg (ref 80.0–100.0)

## 2014-09-03 SURGERY — LEFT AND RIGHT HEART CATHETERIZATION WITH CORONARY ANGIOGRAM
Anesthesia: LOCAL

## 2014-09-03 MED ORDER — ACETAMINOPHEN 325 MG PO TABS: 650.0000 mg | ORAL_TABLET | ORAL | Status: DC | PRN

## 2014-09-03 MED ORDER — ASPIRIN 81 MG PO CHEW
CHEWABLE_TABLET | ORAL | Status: AC
  Administered 2014-09-03: 81 mg via ORAL
  Filled 2014-09-03: qty 1

## 2014-09-03 MED ORDER — FENTANYL CITRATE 0.05 MG/ML IJ SOLN
INTRAMUSCULAR | Status: AC
  Filled 2014-09-03: qty 2

## 2014-09-03 MED ORDER — ONDANSETRON HCL 4 MG/2ML IJ SOLN: 4.0000 mg | Freq: Four times a day (QID) | INTRAMUSCULAR | Status: DC | PRN

## 2014-09-03 MED ORDER — HEPARIN SODIUM (PORCINE) 1000 UNIT/ML IJ SOLN
INTRAMUSCULAR | Status: AC
  Filled 2014-09-03: qty 1

## 2014-09-03 MED ORDER — VERAPAMIL HCL 2.5 MG/ML IV SOLN
INTRAVENOUS | Status: AC
  Filled 2014-09-03: qty 2

## 2014-09-03 MED ORDER — ASPIRIN 81 MG PO CHEW
81.0000 mg | CHEWABLE_TABLET | ORAL | Status: AC
  Administered 2014-09-03: 81 mg via ORAL

## 2014-09-03 MED ORDER — SODIUM CHLORIDE 0.9 % IJ SOLN: 3.0000 mL | Freq: Two times a day (BID) | INTRAMUSCULAR | Status: DC

## 2014-09-03 MED ORDER — LIDOCAINE HCL (PF) 1 % IJ SOLN
INTRAMUSCULAR | Status: AC
  Filled 2014-09-03: qty 30

## 2014-09-03 MED ORDER — SODIUM CHLORIDE 0.9 % IJ SOLN: 3.0000 mL | INTRAMUSCULAR | Status: DC | PRN

## 2014-09-03 MED ORDER — NITROGLYCERIN 1 MG/10 ML FOR IR/CATH LAB
INTRA_ARTERIAL | Status: AC
  Filled 2014-09-03: qty 10

## 2014-09-03 MED ORDER — SODIUM CHLORIDE 0.9 % IV SOLN
INTRAVENOUS | Status: DC
  Administered 2014-09-03: 08:00:00 via INTRAVENOUS

## 2014-09-03 MED ORDER — HEPARIN (PORCINE) IN NACL 2-0.9 UNIT/ML-% IJ SOLN
INTRAMUSCULAR | Status: AC
  Filled 2014-09-03: qty 1000

## 2014-09-03 MED ORDER — SODIUM CHLORIDE 0.9 % IV SOLN: 1.0000 mL/kg/h | INTRAVENOUS | Status: DC

## 2014-09-03 MED ORDER — SODIUM CHLORIDE 0.9 % IV SOLN: 250.0000 mL | INTRAVENOUS | Status: DC | PRN

## 2014-09-03 MED ORDER — MIDAZOLAM HCL 2 MG/2ML IJ SOLN
INTRAMUSCULAR | Status: AC
  Filled 2014-09-03: qty 2

## 2014-09-03 NOTE — H&P (View-Only) (Signed)
HPI: 80 yo male for evaluation of edema. Echo 2006 showed normal LV function. Nuclear study 2006 showed normal LV function and no ischemia. ABIs 3/14 normal. Patient states that for the past one year he has had increased dyspnea on exertion. No orthopnea or PND. He also notes exertional chest discomfort relieved with rest. In the past 6 months he has had progressive increase in lower extremity edema bilaterally. Cardiology asked to evaluate.  Current Outpatient Prescriptions  Medication Sig Dispense Refill  . amLODipine (NORVASC) 5 MG tablet Take 1/2 tablet daily      . aspirin 81 MG tablet Take 81 mg by mouth daily.        Marland Kitchen doxazosin (CARDURA) 4 MG tablet 1/2 tablet daily      . GLIPIZIDE XL 5 MG 24 hr tablet TAKE 2 TABLETS  (10 MG TOTAL) BY MOUTH DAILY  60 tablet  5  . glucose blood (ONE TOUCH TEST STRIPS) test strip Use to test blood sugar 3 times daily as instructed. Dx code: 250.00  100 each  11  . Insulin Detemir (LEVEMIR) 100 UNIT/ML Pen Inject 15 Units into the skin daily at 10 pm.  15 mL  3  . Insulin Pen Needle (FIFTY50 PEN NEEDLES) 31G X 5 MM MISC Use once a day  50 each  3  . JANUVIA 100 MG tablet TAKE 1 TABLET (100 MG TOTAL) BY MOUTH DAILY.  30 tablet  2  . metFORMIN (GLUCOPHAGE) 1000 MG tablet TAKE 1 TABLET (1,000 MG TOTAL) BY MOUTH 2 (TWO) TIMES DAILY WITH A MEAL.  180 tablet  3  . Multiple Vitamins-Minerals (CENTRUM SILVER PO) Take by mouth.      . NON FORMULARY Neuremedy once daily      . omeprazole (PRILOSEC) 20 MG capsule TAKE 1 CAPSULE (20 MG TOTAL) BY MOUTH DAILY.  90 capsule  0  . ONETOUCH DELICA LANCETS 37J MISC Use to test blood sugar 2 times daily as instructed. Dx code: 250.00  100 each  11   No current facility-administered medications for this visit.    Allergies  Allergen Reactions  . Ciprofloxacin Other (See Comments)    "made me have a weird feeling. Disoriented."   . Sulfamethoxazole     REACTION: rash    Past Medical History  Diagnosis Date    . Diabetes mellitus     type 2  . Diverticulosis of colon   . GERD (gastroesophageal reflux disease)   . Osteoarthritis   . Stress incontinence, male     urge  . Dyslipidemia   . Hernia, hiatal   . Hyperlipidemia   . Hypertension   . DVT (deep venous thrombosis)   . Overactive bladder     Hx: of  . IBS (irritable bowel syndrome)     Hx: of  . Pneumonia 2002    history of    Past Surgical History  Procedure Laterality Date  . Wrist tendon surgery    . Cts bilateral    . Knee arthroscopy  1988, 1992    bilateral  . Total knee arthroplasty  2005, 2007  . Lumbar left arm  1961  . Tonsillectomy    . Back surgery    . Colonoscopy      Hx: of  . Laminectomy  08/18/2013    DECOMPRESSIVE LAMINECTOMIES  . Lumbar laminectomy/decompression microdiscectomy N/A 08/18/2013    Procedure: Lumbar two-three, lumbar three-four, lumbar four-five decompressive laminectomies;  Surgeon: [name], MD;  Location:  [place] [facility];  Service: Neurosurgery;  Laterality: N/A;    History   Social History  . Marital Status: Married    Spouse Name: N/A    Number of Children: 2  . Years of Education: N/A   Occupational History  . Not on file.   Social History Main Topics  . Smoking status: Never Smoker   . Smokeless tobacco: Never Used  . Alcohol Use: No  . Drug Use: No  . Sexual Activity: Not on file   Other Topics Concern  . 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 Other Mother 55    CABG  . Heart disease Mother   . Hypertension Mother   . Hyperlipidemia Son     ROS: Patient complains of dizziness and weakness as well as unsteady gait but no fevers or chills, productive cough, hemoptysis, dysphasia, odynophagia, melena, hematochezia, dysuria, hematuria, rash, seizure activity, orthopnea, PND, claudication. Remaining  systems are negative.  Physical Exam:   Blood pressure 120/58, pulse 56, height 5\' 10"  (1.778 m), weight 170 lb 12.8 oz (77.474 kg).  General:  Well developed/well nourished in NAD Skin warm/dry Patient not depressed No peripheral clubbing Back-normal HEENT-normal/normal eyelids Neck supple/normal carotid upstroke bilaterally; no bruits; no JVD; no thyromegaly chest - CTA/ normal expansion CV - RRR/normal S1 and S2; no rubs or gallops;  PMI nondisplaced; 2/6 systolic murmur apex. Abdomen -NT/ND, no HSM, no mass, + bowel sounds, no bruit 2+ femoral pulses, no bruits Ext-1-2+ edema, chords, 2+ DP Neuro-grossly nonfocal  ECG Sinus bradycardia at a rate of 57. Left axis deviation. No ST changes.

## 2014-09-03 NOTE — CV Procedure (Signed)
    Cardiac Catheterization Procedure Note  Name: [name] MRN: [id number] DOB: [birth year]  Procedure: Right Heart Cath, Left Heart Cath, Selective Coronary Angiography  Indication: Shortness of breath, exertional angina   Procedural Details: There was an indwelling IV in a right antecubital vein. Using normal sterile technique, the IV was changed out for a 5 Fr brachial sheath over a 0.018 inch wire. The right wrist was then prepped, draped, and anesthetized with 1% lidocaine. Using the modified Seldinger technique a 5/6 French Slender sheath was placed in the right radial artery. Intra-arterial verapamil was administered through the radial artery sheath. IV heparin was administered after a JR4 catheter was advanced into the central aorta. A Swan-Ganz catheter was used for the right heart catheterization. Standard protocol was followed for recording of right heart pressures and sampling of oxygen saturations. Fick cardiac output was calculated. Standard Judkins catheters were used for selective coronary angiography. Left ventriculography was deferred as the patient had a recent echocardiogram showing normal LV function. There were no immediate procedural complications. The patient was transferred to the post catheterization recovery area for further monitoring.  Procedural Findings: Hemodynamics RA 1 RV 25/3 PA 19/4 mean 11 PCWP a wave 6, v wave 5, mean 4 LV 111/9 AO 110/51  Oxygen saturations: PA 73 AO 96  Cardiac Output (Fick) 6.9  Cardiac Index (Fick) 3.6   Coronary angiography: Coronary dominance: right  Left mainstem: The left main is patent. The vessel arises from the left costal and trifurcates into the LAD, intermediate branch, and left circumflex. The distal left main has 30% stenosis.  Left anterior descending (LAD): The LAD is patent to the left ventricular apex. The ostium of the vessel has 40-50% stenosis. The remainder of the vessel is smooth throughout its  course. The diagonal branch is no obstructive disease. The LAD and diagonal branch are medium in caliber.  Left circumflex (LCx): The left circumflex is patent. There is a medium caliber intermediate branch with no significant obstructive disease. The circumflex supplies a single OM without significant disease.  Right coronary artery (RCA): Dominant vessel. The RCA is smooth throughout its course. The PDA and PLA branches are small but widely patent. The acute marginal branch is widely patent.  Left ventriculography: Deferred. Normal LV function by recent echocardiogram.  Contrast: 60 cc Omnipaque  Estimated Blood Loss: Minimal  Final Conclusions:   1. Nonobstructive coronary artery disease with wide patency of the RCA and left circumflex, and nonobstructive plaquing of the LAD.  2. Low intracardiac filling pressures with normal cardiac output  3. Normal LV function by echo assessment.   Recommendations: Medical therapy for nonobstructive CAD. Suspect the patient's dyspnea and chest pain is noncardiac in nature.  Sherren Mocha MD, [facility] 09/03/2014, 9:05 AM

## 2014-09-03 NOTE — Progress Notes (Signed)
Right brachial venous sheath removed no bleeding or hematoma

## 2014-09-03 NOTE — Telephone Encounter (Signed)
Left message for pt to call,  This encounter was created in error - please disregard.

## 2014-09-03 NOTE — Discharge Instructions (Signed)
NO METFORMIN FOR 48HOURS   Radial Site Care Refer to this sheet in the next few weeks. These instructions provide you with information on caring for yourself after your procedure. Your caregiver may also give you more specific instructions. Your treatment has been planned according to current medical practices, but problems sometimes occur. Call your caregiver if you have any problems or questions after your procedure. HOME CARE INSTRUCTIONS  You may shower the day after the procedure.Remove the bandage (dressing) and gently wash the site with plain soap and water.Gently pat the site dry.  Do not apply powder or lotion to the site.  Do not submerge the affected site in water for 3 to 5 days.  Inspect the site at least twice daily.  Do not flex or bend the affected arm for 24 hours.  No lifting over 5 pounds (2.3 kg) for 5 days after your procedure.  Do not drive home if you are discharged the same day of the procedure. Have someone else drive you.  You may drive 24 hours after the procedure unless otherwise instructed by your caregiver.  Do not operate machinery or power tools for 24 hours.  A responsible adult should be with you for the first 24 hours after you arrive home. What to expect:  Any bruising will usually fade within 1 to 2 weeks.  Blood that collects in the tissue (hematoma) may be painful to the touch. It should usually decrease in size and tenderness within 1 to 2 weeks. SEEK IMMEDIATE MEDICAL CARE IF:  You have unusual pain at the radial site.  You have redness, warmth, swelling, or pain at the radial site.  You have drainage (other than a small amount of blood on the dressing).  You have chills.  You have a fever or persistent symptoms for more than 72 hours.  You have a fever and your symptoms suddenly get worse.  Your arm becomes pale, cool, tingly, or numb.  You have heavy bleeding from the site. Hold pressure on the site. Document Released:  12/30/2010 Document Revised: 02/19/2012 Document Reviewed: 12/30/2010 [facility] Patient Information 2015 [place], [place]. This information is not intended to replace advice given to you by your health care provider. Make sure you discuss any questions you have with your health care provider.

## 2014-09-03 NOTE — Interval H&P Note (Signed)
History and Physical Interval Note:  09/03/2014 8:25 AM  [name]  has presented today for surgery, with the diagnosis of cp  The various methods of treatment have been discussed with the patient and family. After consideration of risks, benefits and other options for treatment, the patient has consented to  Procedure(s): LEFT AND RIGHT HEART CATHETERIZATION WITH CORONARY ANGIOGRAM (N/A) as a surgical intervention .  The patient's history has been reviewed, patient examined, no change in status, stable for surgery.  I have reviewed the patient's chart and labs.  Questions were answered to the patient's satisfaction.    Cath Lab Visit (complete for each Cath Lab visit)  Clinical Evaluation Leading to the Procedure:   ACS: No.  Non-ACS:    Anginal Classification: CCS III  Anti-ischemic medical therapy: Minimal Therapy (1 class of medications)  Non-Invasive Test Results: No non-invasive testing performed  Prior CABG: No previous CABG       Sherren Mocha

## 2014-10-07 ENCOUNTER — Encounter: Payer: Self-pay | Admitting: Internal Medicine

## 2014-10-07 ENCOUNTER — Ambulatory Visit (INDEPENDENT_AMBULATORY_CARE_PROVIDER_SITE_OTHER): Payer: Medicare Other | Admitting: Internal Medicine

## 2014-10-07 VITALS — BP 116/58 | HR 65 | Temp 97.9°F | Resp 12 | Wt 173.0 lb

## 2014-10-07 DIAGNOSIS — E1151 Type 2 diabetes mellitus with diabetic peripheral angiopathy without gangrene: Secondary | ICD-10-CM

## 2014-10-07 DIAGNOSIS — E1165 Type 2 diabetes mellitus with hyperglycemia: Principal | ICD-10-CM

## 2014-10-07 DIAGNOSIS — E1159 Type 2 diabetes mellitus with other circulatory complications: Secondary | ICD-10-CM

## 2014-10-07 DIAGNOSIS — IMO0002 Reserved for concepts with insufficient information to code with codable children: Secondary | ICD-10-CM

## 2014-10-07 MED ORDER — INSULIN ASPART 100 UNIT/ML FLEXPEN: 4.0000 [IU] | PEN_INJECTOR | Freq: Three times a day (TID) | SUBCUTANEOUS | Status: DC

## 2014-10-07 MED ORDER — INSULIN PEN NEEDLE 31G X 5 MM MISC: Status: DC

## 2014-10-07 NOTE — Patient Instructions (Signed)
Patient Instructions  Please continue: - Metformin 1000 mg 2x a day Stop: - Januvia - Glipizide XL Continue: - Levemir 13 units in am Start: - NovoLog insulin 15 min before the 3 main meals of the day:  4 units before a small meal  6 units before a regular meal  8 units before a large meal or if you also have dessert  Please return in 1-1.5 month with your sugar log.   Please let me know if the sugars are consistently <80 or >200.

## 2014-10-07 NOTE — Progress Notes (Signed)
Subjective:     Patient ID: [name], male   DOB: 12-24-1932, 80 y.o.   MRN: [id number]  HPI Mr. Petrucelli is a very pleasant 80 y.o. man returning for management of DM2, dx 07/2012, uncontrolled, insulin-dependent, with complications (peripheral neuropathy, PAD). Last visit 1.5 mo ago.  He had a heart catheterization on 09/03/2014 >> 2 small Blockages >> no intervention. 2D ECHO with normal EF.  Last HbA1C was: Lab Results  Component Value Date   HGBA1C 9.7* 08/25/2014   HGBA1C 9.7* 05/18/2014   HGBA1C 8.8* 02/04/2014   He is on a regimen of: - Metformin 1000 mg bid - Januvia 100 mg daily - Glipizide XL 10 mg  - Cycloset 1.6 mg  - Levemir 15 >> 13 units added 07/2014. We moved this to am  Pt again brings a great log, he checks sugars ~2-4x a day before and after meals and they are improved (reviewed his log) but has lows in am and still highs as the day goes by: - am: 117-190 >> in last 2 weeks 138-243 (140-160) >> 117-147 >> 121-175 >> 115-150 >> 150-272 >> 54, 62-121 >> 159-215, 236 - 2h after b'fast: 184-225 >> 151-163 >> 171-228 >>169-176 >> 251-302 >> 123-179 >> 191-239 - before lunch: 148-178 >> 165-219 >> 107-110 >> 142-184 >> 145-175 >> 246-324 >> 108-165, 186, 207 >> 150-160 - 2h after lunch: 154-178 >> n/c >> 161-170 >> 157-241 >> 131-201 >> 235-350 >> 102, 179-215 >> 184-214 - dinner: 171 >> 161-265 >> n/c >> 180-210 >> 111-181 >> 177-290 >> 152, 210 >> 136 - 2h after dinner: 141-174 >> 171-250 >> 119-170 >> 219-301 >> 137, 162-250 >> 197-230 - bedtime: 117-158 >> 165-234 >> 104-166 >> 157-236 >> 119-132 >> 220-325 >> 142-250 >> 124, 176-248 Had lows in am (47-60s) >> not any more..   - no CKD. Last BUN/Cr: Lab Results  Component Value Date   BUN 15 08/26/2014   CREATININE 0.8 08/26/2014  Last ACR was normal in 05/2013. - He has peripheral neuropathy. He is taking a B complex vitamin, and has a neuropathy cream. Seeing [name] at the [facility]. Last foot exam  03/05/2014. - Last eye exam 11/17/2013: no DR, new glasses (Dr. Alois Cliche).   I reviewed pt's medications, allergies, PMH, social hx, family hx and no changes required, except as above.   Review of Systems Constitutional: no weight changes, no fatigue, no subjective hyperthermia/hypothermia, + nocturia Eyes+no blurry vision, no xerophthalmia ENT: no sore throat, no dysphagia/odynophagia/+ hoarseness Cardiovascular: no CP/SOB/no palpitations/+ mild leg swelling Respiratory: no cough/SOB Gastrointestinal: no N/V/D/C Musculoskeletal: + muscle/+ joint aches Skin: no rash Neuro: no tremors, but disequilibrium (walks with a cane)  Objective:   Physical Exam BP 116/58  Pulse 65  Temp(Src) 97.9 F (36.6 C) (Oral)  Resp 12  Wt 173 lb (78.472 kg)  SpO2 98% Body mass index is 24.82 kg/(m^2).  Wt Readings from Last 3 Encounters:  10/07/14 173 lb (78.472 kg)  09/03/14 165 lb (74.844 kg)  09/03/14 165 lb (74.844 kg)  Constitutional: normal weight, in NAD Eyes: PERRLA, EOMI, no exophthalmos ENT: moist mucous membranes, no thyromegaly, no cervical lymphadenopathy Cardiovascular: RRR, No MRG, + leg swelling Respiratory: CTA B Gastrointestinal: abdomen soft, NT, ND, BS+ Skin: moist, warm, no rashes Neurological: no tremor with outstretched hands  Assessment:     1. DM2, non-insulin-dependent, uncontrolled, with complications - target T6L for him: 7-7.5% - peripheral neuropathy - PAD Dr [name]  Plan:  1. Diabetes mellitus type 2, with worsened control despite adding basal insulin. - he is very conscientious about taking his medicines and keeping a good CBG log >> continue checking CBGs 2x a day - I advised him to: Patient Instructions  Please continue: - Metformin 1000 mg 2x a day Stop: - Januvia - Glipizide XL Continue: - Levemir 13 units in am Start: - NovoLog insulin 15 min before the 3 main meals of the day:  4 units before a small meal  6 units before a  regular meal  8 units before a large meal or if you also have dessert  Please return in 1-1.5 month with your sugar log.   Please let me know if the sugars are consistently <80 or >200. - explained the purpose of mealtime insulin, ways of administration, inj technique - up to date with eye exams and foot exams  - does not have a computer at home so he cannot join my chart - RTC in 1-1.5 mo with sugar log  - time spent with the patient: 25 min, of which >50% was spent in reviewing his sugars, discussing switching to mealtime insulin, the need to watch for hypo- and hyper-glycemic episodes, reviewing her previous labs and developing a plan to avoid hypo- an hyper-glycemia.

## 2014-10-14 ENCOUNTER — Ambulatory Visit (INDEPENDENT_AMBULATORY_CARE_PROVIDER_SITE_OTHER): Payer: Medicare Other | Admitting: Cardiology

## 2014-10-14 ENCOUNTER — Encounter: Payer: Self-pay | Admitting: Cardiology

## 2014-10-14 VITALS — BP 116/65 | HR 58 | Ht 70.0 in | Wt 174.0 lb

## 2014-10-14 DIAGNOSIS — R609 Edema, unspecified: Secondary | ICD-10-CM

## 2014-10-14 DIAGNOSIS — I2510 Atherosclerotic heart disease of native coronary artery without angina pectoris: Secondary | ICD-10-CM | POA: Insufficient documentation

## 2014-10-14 DIAGNOSIS — E785 Hyperlipidemia, unspecified: Secondary | ICD-10-CM

## 2014-10-14 MED ORDER — FUROSEMIDE 20 MG PO TABS: 20.0000 mg | ORAL_TABLET | Freq: Every day | ORAL | Status: DC | PRN

## 2014-10-14 MED ORDER — PRAVASTATIN SODIUM 40 MG PO TABS: 40.0000 mg | ORAL_TABLET | Freq: Every evening | ORAL | Status: DC

## 2014-10-14 NOTE — Patient Instructions (Signed)
Your physician wants you to follow-up in: [place] will receive a reminder letter in the mail two months in advance. If you don't receive a letter, please call our office to schedule the follow-up appointment.   START PRAVASTATIN 40 MG ONCE DAILY  Your physician recommends that you return for lab work in: [place] OFFICE= DO NOT EAT PRIOR TO LAB WORK

## 2014-10-14 NOTE — Assessment & Plan Note (Signed)
Blood pressure controlled. Continue present medications. 

## 2014-10-14 NOTE — Assessment & Plan Note (Signed)
Pravachol 40 mg daily. Lipids, liver and CK in 6 weeks.

## 2014-10-14 NOTE — Assessment & Plan Note (Deleted)
Edema has improved with low-dose Lasix. Continue but changed to 20 mg as needed. Note pulmonary capillary wedge pressure was decreased at time of recent catheterization.

## 2014-10-14 NOTE — Assessment & Plan Note (Signed)
Edema has improved with low-dose Lasix. Continue but changed to 20 mg as needed. Note pulmonary capillary wedge pressure was decreased at time of recent catheterization.

## 2014-10-14 NOTE — Assessment & Plan Note (Signed)
Continue aspirin. Add Pravachol 40 mg daily. Check lipids, liver and CK in 6 weeks.

## 2014-10-14 NOTE — Progress Notes (Signed)
HPI: FU edema and chest pain. ABIs 3/14 normal. Seen recently for chest pain, dyspnea and lower ext edema. Echocardiogram September 2015 showed normal LV function, grade 1 diastolic dysfunction and mild mitral regurgitation. Moderate left atrial enlargement. Cardiac catheterization September 2015 showed a 30% left main, 40-50% LAD and no obstructive disease in the circumflex or right coronary artery. Prominent Wedge pressure 4. PA pressure 19 over 4. Since last seen, He denies dyspnea, chest pain, palpitations or syncope. His pedal edema has improved.  Current Outpatient Prescriptions  Medication Sig Dispense Refill  . amLODipine (NORVASC) 5 MG tablet Take 2.5 mg by mouth daily.    Marland Kitchen aspirin 81 MG tablet Take 81 mg by mouth daily.     Marland Kitchen CINNAMON PO Take 1,000 mg by mouth daily.    Marland Kitchen doxazosin (CARDURA) 4 MG tablet Take 2 mg by mouth daily.    . furosemide (LASIX) 20 MG tablet Take 1 tablet (20 mg total) by mouth daily. 30 tablet 11  . Glucosamine HCl (GLUCOSAMINE PO) Take 2,000 mg by mouth daily.    . insulin aspart (NOVOLOG FLEXPEN) 100 UNIT/ML FlexPen Inject 4-8 Units into the skin 3 (three) times daily with meals. 15 mL 2  . Insulin Detemir (LEVEMIR) 100 UNIT/ML Pen Inject 13 Units into the skin daily before breakfast. 15 mL 3  . metFORMIN (GLUCOPHAGE) 1000 MG tablet Take 1,000 mg by mouth 2 (two) times daily with a meal.    . Multiple Vitamins-Minerals (CENTRUM SILVER PO) Take 1 tablet by mouth daily.     Marland Kitchen omeprazole (PRILOSEC) 20 MG capsule TAKE 1 CAPSULE (20 MG TOTAL) BY MOUTH DAILY. 90 capsule 0  . Propylene Glycol (SYSTANE BALANCE OP) Place 2 drops into both eyes daily as needed (for eyes).    . TURMERIC PO Take 400 mg by mouth daily.    Marland Kitchen BOOSTRIX 5-2.5-18.5 injection     . glucose blood (ONE TOUCH TEST STRIPS) test strip Use to test blood sugar 3 times daily as instructed. Dx code: 250.00 100 each 11  . Insulin Pen Needle (FIFTY50 PEN NEEDLES) 31G X 5 MM MISC Use 4x a day 200  each 3  . ONETOUCH DELICA LANCETS 96E MISC Use to test blood sugar 2 times daily as instructed. Dx code: 250.00 100 each 11   No current facility-administered medications for this visit.     Past Medical History  Diagnosis Date  . Diabetes mellitus     type 2  . Diverticulosis of colon   . GERD (gastroesophageal reflux disease)   . Osteoarthritis   . Stress incontinence, male     urge  . Dyslipidemia   . Hernia, hiatal   . Hyperlipidemia   . Hypertension   . DVT (deep venous thrombosis)   . Overactive bladder     Hx: of  . IBS (irritable bowel syndrome)     Hx: of  . Pneumonia 2002    history of    Past Surgical History  Procedure Laterality Date  . Wrist tendon surgery    . Cts bilateral    . Knee arthroscopy  1988, 1992    bilateral  . Total knee arthroplasty  2005, 2007  . Lumbar left arm  1961  . Tonsillectomy    . Back surgery    . Colonoscopy      Hx: of  . Laminectomy  08/18/2013    DECOMPRESSIVE LAMINECTOMIES  . Lumbar laminectomy/decompression microdiscectomy N/A 08/18/2013    Procedure:  Lumbar two-three, lumbar three-four, lumbar four-five decompressive laminectomies;  Surgeon: [name], MD;  Location: [facility];  Service: Neurosurgery;  Laterality: N/A;    History   Social History  . Marital Status: Married    Spouse Name: N/A    Number of Children: 2  . Years of Education: N/A   Occupational History  . Not on file.   Social History Main Topics  . Smoking status: Never Smoker   . Smokeless tobacco: Never Used  . Alcohol Use: No  . Drug Use: No  . Sexual Activity: Not on file   Other Topics Concern  . Not on file   Social History Narrative   Married   2 adult boy and girl   Retired from [employer]. And a [employer]   Hobbies: sports    ROS: no fevers or chills, productive cough, hemoptysis, dysphasia, odynophagia, melena, hematochezia, dysuria, hematuria, rash, seizure activity, orthopnea, PND, pedal edema,  claudication. Remaining systems are negative.  Physical Exam: Well-developed well-nourished in no acute distress.  Skin is warm and dry.  HEENT is normal.  Neck is supple.  Chest is clear to auscultation with normal expansion.  Cardiovascular exam is regular rate and rhythm. 2/6 systolic murmur left sternal border. Abdominal exam nontender or distended. No masses palpated. Extremities show no edema. neuro grossly intact

## 2014-10-19 ENCOUNTER — Other Ambulatory Visit: Payer: Self-pay | Admitting: Internal Medicine

## 2014-11-10 LAB — HM DIABETES EYE EXAM

## 2014-11-11 ENCOUNTER — Ambulatory Visit: Payer: Medicare Other | Admitting: Internal Medicine

## 2014-11-12 ENCOUNTER — Telehealth: Payer: Self-pay | Admitting: Cardiology

## 2014-11-12 ENCOUNTER — Encounter: Payer: Self-pay | Admitting: Internal Medicine

## 2014-11-12 ENCOUNTER — Ambulatory Visit (INDEPENDENT_AMBULATORY_CARE_PROVIDER_SITE_OTHER): Payer: Medicare Other | Admitting: Internal Medicine

## 2014-11-12 VITALS — BP 122/68 | HR 63 | Temp 97.5°F | Resp 12 | Wt 182.6 lb

## 2014-11-12 DIAGNOSIS — E1165 Type 2 diabetes mellitus with hyperglycemia: Principal | ICD-10-CM

## 2014-11-12 DIAGNOSIS — IMO0002 Reserved for concepts with insufficient information to code with codable children: Secondary | ICD-10-CM

## 2014-11-12 DIAGNOSIS — R609 Edema, unspecified: Secondary | ICD-10-CM

## 2014-11-12 DIAGNOSIS — E1159 Type 2 diabetes mellitus with other circulatory complications: Secondary | ICD-10-CM

## 2014-11-12 DIAGNOSIS — E1151 Type 2 diabetes mellitus with diabetic peripheral angiopathy without gangrene: Secondary | ICD-10-CM

## 2014-11-12 DIAGNOSIS — E785 Hyperlipidemia, unspecified: Secondary | ICD-10-CM

## 2014-11-12 MED ORDER — FUROSEMIDE 20 MG PO TABS: 40.0000 mg | ORAL_TABLET | Freq: Every day | ORAL | Status: DC | PRN

## 2014-11-12 MED ORDER — INSULIN ASPART 100 UNIT/ML FLEXPEN: 6.0000 [IU] | PEN_INJECTOR | Freq: Three times a day (TID) | SUBCUTANEOUS | Status: DC

## 2014-11-12 NOTE — Progress Notes (Signed)
Subjective:     Patient ID: [name], male   DOB: 1933/03/06, 80 y.o.   MRN: [id number]  HPI [name] is a very pleasant 80 y.o. man returning for management of DM2, dx 07/2012, uncontrolled, insulin-dependent, with complications (peripheral neuropathy, PAD). Last visit 1.5 mo ago.  Last HbA1C was: Lab Results  Component Value Date   HGBA1C 9.7* 08/25/2014   HGBA1C 9.7* 05/18/2014   HGBA1C 8.8* 02/04/2014   He was on a regimen of: - Metformin 1000 mg bid - Januvia 100 mg daily - Glipizide XL 10 mg  - Cycloset 1.6 mg  - Levemir 15 >> 13 units added 07/2014. We moved this to am  He is now on: - Metformin 1000 mg 2x a day - Levemir 13 units in am - NovoLog insulin 15 min before the 3 main meals of the day:  4 units before a small meal  6 units before a regular meal  8 units before a large meal or if you also have dessert  Pt again brings a great log, he checks sugars ~2-4x a day - much better but still high in am - am: 117-190 >> in last 2 weeks 138-243 (140-160) >> 117-147 >> 121-175 >> 115-150 >> 150-272 >> 54, 62-121 >> 159-215, 236 >>176-210 - 2h after b'fast: 184-225 >> 151-163 >> 171-228 >>169-176 >> 251-302 >> 123-179 >> 191-239 >> 114-187 - before lunch: 148-178 >> 165-219 >> 107-110 >> 142-184 >> 145-175 >> 246-324 >> 108-165, 186, 207 >> 150-160 >> 93-166, 228 - 2h after lunch: 154-178 >> n/c >> 161-170 >> 157-241 >> 131-201 >> 235-350 >> 102, 179-215 >> 184-214 >> 94-177, 196 - dinner: 171 >> 161-265 >> n/c >> 180-210 >> 111-181 >> 177-290 >> 152, 210 >> 136 >> 81-162 - 2h after dinner: 141-174 >> 171-250 >> 119-170 >> 219-301 >> 137, 162-250 >> 197-230 >> 97, 104-186, 2x200 - bedtime: 117-158 >> 165-234 >> 104-166 >> 157-236 >> 119-132 >> 220-325 >> 142-250 >> 124, 176-248 >> 108-164 Had lows in am (47-60s) >> not any more..   - no CKD. Last BUN/Cr: Lab Results  Component Value Date   BUN 15 08/26/2014   CREATININE 0.8 08/26/2014  Last ACR was normal  in 05/2013. - He has peripheral neuropathy. He is taking a B complex vitamin, and has a neuropathy cream. [name] [facility]. Last foot exam 03/05/2014. - Last eye exam 11/10/2014: no DR (Dr. Alois Cliche).   He had a heart catheterization on 09/03/2014 >> 2 small Blockages >> no intervention. 2D ECHO with normal EF.  I reviewed pt's medications, allergies, PMH, social hx, family hx and no changes required, except as above.   Review of Systems Constitutional: no weight changes, no fatigue, no subjective hyperthermia/hypothermia Eye: no blurry vision, no xerophthalmia ENT: no sore throat, no dysphagia/odynophagia/hoarseness Cardiovascular: no CP/SOB/no palpitations/+ leg swelling Respiratory: no cough/SOB Gastrointestinal: no N/V/D/C Musculoskeletal: no muscle/joint aches Skin: no rash Neuro: no tremors  Objective:   Physical Exam BP 122/68 mmHg  Pulse 63  Temp(Src) 97.5 F (36.4 C) (Oral)  Resp 12  Wt 182 lb 9.6 oz (82.827 kg)  SpO2 97% Body mass index is 26.2 kg/(m^2).  Wt Readings from Last 3 Encounters:  11/12/14 182 lb 9.6 oz (82.827 kg)  10/14/14 174 lb (78.926 kg)  10/07/14 173 lb (78.472 kg)  Constitutional: normal weight, in NAD Eyes: PERRLA, EOMI, no exophthalmos ENT: moist mucous membranes, no thyromegaly, no cervical lymphadenopathy Cardiovascular: RRR, No  MRG, + pitting leg edema B - wears compression hoses Respiratory: CTA B Gastrointestinal: abdomen soft, NT, ND, BS+ Skin: moist, warm, no rashes Neurological: no tremor with outstretched hands  Assessment:     1. DM2, non-insulin-dependent, uncontrolled, with complications - target Q1J for him: 7-7.5% - peripheral neuropathy - PAD Dr [name]  Plan:     1. Diabetes mellitus type 2, with improved control after adding mealtime insulin. Sugars are highest in am >> will move Levemir at night. Will also increase NovoLog a little. - I advised him to: Patient Instructions  Please  continue: - Metformin 1000 mg 2x a day - Move the Levemir 13 units to bedtime. Increase to 15 units in 1 week if sugars in am not <150 - Increase the NovoLog insulin 15 min before the 3 main meals of the day:  6 units before a small meal  8 units before a regular meal  10 units before a large meal or if you also have dessert Please come back for a follow-up appointment in 2 months.  - he is very conscientious about taking his medicines and keeping a good CBG log >> continue checking CBGs 2x a day - up to date with eye exams and foot exams  - does not have a computer at home so he cannot join my chart - RTC in 2 mo with sugar log. Will check HbA1c then.

## 2014-11-12 NOTE — Patient Instructions (Signed)
Please continue: - Metformin 1000 mg 2x a day - Move the Levemir 13 units to bedtime. Increase to 15 units in 1 week if sugars in am not <150 - Increase the NovoLog insulin 15 min before the 3 main meals of the day:  6 units before a small meal  8 units before a regular meal  10 units before a large meal or if you also have dessert  Please come back for a follow-up appointment in 2 months.

## 2014-11-12 NOTE — Telephone Encounter (Signed)
Please call,just left his urinologist. He said he had edema in his legs.He told him to contact Dr Elmer Bales his medicine need to be increased.

## 2014-11-12 NOTE — Telephone Encounter (Signed)
Spoke with pt, Aware of dr crenshaw's recommendations.  °

## 2014-11-12 NOTE — Telephone Encounter (Signed)
Spoke with pt, for the last week he has had an increase in the swelling in his feet and ankles. He is getting injections from the urologist for overactive bladder and the nurse told him to call. He has about 2+ pitting edema. He denies SOB. He has been taking his furosemide daily for the last week with no change in the edema. He watches his salt. His bp is running 120's/50's. Will forward for dr [name] review

## 2014-11-12 NOTE — Telephone Encounter (Signed)
Change lasix to 40 mg daily; bmet one week Mavi Un  

## 2014-11-16 ENCOUNTER — Ambulatory Visit: Payer: Medicare Other | Admitting: Family Medicine

## 2014-11-19 ENCOUNTER — Encounter (HOSPITAL_COMMUNITY): Payer: Self-pay | Admitting: Cardiovascular Disease

## 2014-11-19 LAB — BASIC METABOLIC PANEL WITH GFR
BUN: 17 mg/dL (ref 6–23)
CALCIUM: 9.8 mg/dL (ref 8.4–10.5)
CO2: 29 mEq/L (ref 19–32)
CREATININE: 0.68 mg/dL (ref 0.50–1.35)
Chloride: 100 mEq/L (ref 96–112)
Glucose, Bld: 151 mg/dL — ABNORMAL HIGH (ref 70–99)
Potassium: 4.5 mEq/L (ref 3.5–5.3)
SODIUM: 137 meq/L (ref 135–145)

## 2014-11-23 ENCOUNTER — Encounter: Payer: Self-pay | Admitting: Family Medicine

## 2014-11-23 ENCOUNTER — Ambulatory Visit (INDEPENDENT_AMBULATORY_CARE_PROVIDER_SITE_OTHER): Payer: Medicare Other | Admitting: Family Medicine

## 2014-11-23 VITALS — BP 120/70 | HR 65 | Temp 98.2°F | Ht 70.0 in | Wt 178.2 lb

## 2014-11-23 DIAGNOSIS — K219 Gastro-esophageal reflux disease without esophagitis: Secondary | ICD-10-CM

## 2014-11-23 DIAGNOSIS — Z23 Encounter for immunization: Secondary | ICD-10-CM

## 2014-11-23 DIAGNOSIS — E785 Hyperlipidemia, unspecified: Secondary | ICD-10-CM

## 2014-11-23 DIAGNOSIS — I5032 Chronic diastolic (congestive) heart failure: Secondary | ICD-10-CM

## 2014-11-23 DIAGNOSIS — E1159 Type 2 diabetes mellitus with other circulatory complications: Secondary | ICD-10-CM

## 2014-11-23 DIAGNOSIS — I10 Essential (primary) hypertension: Secondary | ICD-10-CM

## 2014-11-23 DIAGNOSIS — IMO0002 Reserved for concepts with insufficient information to code with codable children: Secondary | ICD-10-CM

## 2014-11-23 DIAGNOSIS — E1151 Type 2 diabetes mellitus with diabetic peripheral angiopathy without gangrene: Secondary | ICD-10-CM

## 2014-11-23 DIAGNOSIS — E1165 Type 2 diabetes mellitus with hyperglycemia: Secondary | ICD-10-CM

## 2014-11-23 DIAGNOSIS — R609 Edema, unspecified: Secondary | ICD-10-CM

## 2014-11-23 HISTORY — DX: Chronic diastolic (congestive) heart failure: I50.32

## 2014-11-23 NOTE — Progress Notes (Signed)
HPI:  [name] is a very pleasant 80 yo prior patient of Dr. Leanne Chang here to establish care.   Has the following chronic problems that require follow up and concerns today:  CAD/HTN/HLD/Edema/CdCHF: -managed by his cardiologist, recent note reviewed -echo 08/2014 with normal LV function, grade 1 diastolic dysfunction and mild mitral regurg per cardiology notes -cardiac cath 08/2014 30% left main, 40-50% LAD and no obstructive disease in the circumflex or right coronary artery. Prominent Wedge pressure 4. PA pressure 19 over 4 -meds: amlodipine 5, asa 81 mg, cardura 4 mg, lasix 20mg  prn,pravastatin 40 mg added last cardiology visit -denies: CP, SOB, syncope, palpitations, sig change in swelling or weight gain  DM, uncontrolled with peripheral neuropathy and PAD: -managed by his endocrinologist: reports has appt next month and checking labs then -reports fasting BS much improved -meds: reports taking levemir 15 units nightly, novolog 8-10 units with meals, metformin 1000mg  bid -wonders if 92 is too low for fasting BS -diet and exercise: no regular exercise; eating a healthy diet per his recommendation - reports insurance nurse is helping him with his meals -denies: wounds, vision loss, hypoglycemic symptoms  GERD: -meds: prilosec 20 mg daily -hx hiatal hernia -denies: dysphagia, worsening gerd, nausea, vomiting, weight loss  Overactive bladder: -reports sees a urologist for this -Dr. Patsy Baltimore per his report  Cyst posterior neck for many years: -stable, reports eval many years ago and stable  Reports sees eye doctor frequently and per his report no diabetic retinopathy  HM: -prevnar 43 - wants to get this today  ROS negative for unless reported above: fevers, unintentional weight loss, hearing or vision loss, chest pain, palpitations, struggling to breath, hemoptysis, melena, hematochezia, hematuria, falls, loc, si, thoughts of self harm  Past Medical History  Diagnosis  Date  . Diabetes mellitus     type 2  . Diverticulosis of colon   . GERD (gastroesophageal reflux disease)   . Osteoarthritis   . Stress incontinence, male     urge  . Dyslipidemia   . Hernia, hiatal   . Hyperlipidemia   . Hypertension   . DVT (deep venous thrombosis)   . Overactive bladder     Hx: of  . IBS (irritable bowel syndrome)     Hx: of  . Pneumonia 2002    history of  . Varicose veins of lower extremities with other complications 1/44/8185  . Atherosclerosis of native arteries of the extremities with intermittent claudication 02/10/2013  . DIVERTICULOSIS, COLON 06/13/2007    Qualifier: Diagnosis of  By: Leanne Chang MD, Bruce    . DEGENERATIVE JOINT DISEASE, GENERALIZED 01/24/2010    Annotation: diffuse pains of joints Qualifier: Diagnosis of  By: Leanne Chang MD, Bruce    . Shingles 09/11/2013  . Chronic diastolic congestive heart failure 11/23/2014  . CAD (coronary artery disease) 10/14/2014  . PERIPHERAL NEUROPATHY 05/30/2010    Annotation: likely DM related Qualifier: Diagnosis of  By: Leanne Chang MD, Bruce    . Edema 08/19/2014    Past Surgical History  Procedure Laterality Date  . Wrist tendon surgery    . Cts bilateral    . Knee arthroscopy  1988, 1992    bilateral  . Total knee arthroplasty  2005, 2007  . Lumbar left arm  1961  . Tonsillectomy    . Back surgery    . Colonoscopy      Hx: of  . Laminectomy  08/18/2013    DECOMPRESSIVE LAMINECTOMIES  . Lumbar laminectomy/decompression microdiscectomy N/A 08/18/2013  Procedure: Lumbar two-three, lumbar three-four, lumbar four-five decompressive laminectomies;  Surgeon: [name], MD;  Location: [facility];  Service: Neurosurgery;  Laterality: N/A;  . Left and right heart catheterization with coronary angiogram N/A 09/03/2014    Procedure: LEFT AND RIGHT HEART CATHETERIZATION WITH CORONARY ANGIOGRAM;  Surgeon: [name], MD;  [facility];  Service: Cardiovascular;  Laterality: N/A;    Family History   Problem Relation Age of Onset  . Other Mother 14    CABG  . Heart disease Mother   . Hypertension Mother   . Hyperlipidemia Son     History   Social History  . Marital Status: Married    Spouse Name: N/A    Number of Children: 2  . Years of Education: N/A   Social History Main Topics  . Smoking status: Never Smoker   . Smokeless tobacco: Never Used  . Alcohol Use: No  . Drug Use: No  . Sexual Activity: None   Other Topics Concern  . 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 , Disp: , Rfl: ;  BOOSTRIX 5-2.5-18.5 injection, , Disp: , Rfl: ;  CINNAMON PO, Take 1,000 mg by mouth daily., Disp: , Rfl: ;  doxazosin (CARDURA) 4 MG tablet, Take 2 mg by mouth daily., Disp: , Rfl:  furosemide (LASIX) 20 MG tablet, Take 2 tablets (40 mg total) by mouth daily as needed., Disp: 60 tablet, Rfl: 11;  Glucosamine HCl (GLUCOSAMINE PO), Take 2,000 mg by mouth daily., Disp: , Rfl: ;  glucose blood (ONE TOUCH TEST STRIPS) test strip, Use to test blood sugar 3 times daily as instructed. Dx code: 250.00, Disp: 100 each, Rfl: 11 insulin aspart (NOVOLOG FLEXPEN) 100 UNIT/ML FlexPen, Inject 6-10 Units into the skin 3 (three) times daily with meals., Disp: 15 mL, Rfl: 2;  Insulin Detemir (LEVEMIR) 100 UNIT/ML Pen, Inject 13 Units into the skin daily before breakfast., Disp: 15 mL, Rfl: 3;  Insulin Pen Needle (FIFTY50 PEN NEEDLES) 31G X 5 MM MISC, Use 4x a day, Disp: 200 each, Rfl: 3 metFORMIN (GLUCOPHAGE) 1000 MG tablet, Take 1,000 mg by mouth 2 (two)  times daily with a meal., Disp: , Rfl: ;  Multiple Vitamins-Minerals (CENTRUM SILVER PO), Take 1 tablet by mouth daily. , Disp: , Rfl: ;  omeprazole (PRILOSEC) 20 MG capsule, TAKE 1 CAPSULE (20 MG TOTAL) BY MOUTH DAILY., Disp: 90 capsule, Rfl: 0;  ONETOUCH DELICA LANCETS 16X MISC, Use to test blood sugar 2 times daily as instructed. Dx code: 250.00, Disp: 100 each, Rfl: 11 pravastatin (PRAVACHOL) 40 MG tablet, Take 1 tablet (40 mg total) by mouth every evening., Disp: 30 tablet, Rfl: 11;  Propylene Glycol (SYSTANE BALANCE OP), Place 2 drops into both eyes daily as needed (for eyes)., Disp: , Rfl: ;  TURMERIC PO, Take 400 mg by mouth daily., Disp: , Rfl:   EXAM:  Filed Vitals:  11/23/14 1055  BP: 120/70  Pulse: 65  Temp: 98.2 F (36.8 C)    Body mass index is 25.57 kg/(m^2).  GENERAL: vitals reviewed and listed above, alert, oriented, appears well hydrated and in no acute distress  HEENT: atraumatic, conjunttiva clear, no obvious abnormalities on inspection of external nose and ears  NECK: no obvious masses on inspection  LUNGS: clear to auscultation bilaterally, no wheezes, rales or rhonchi, good air movement  CV: HRRR, no peripheral edema  MS: moves all extremities without noticeable abnormality  PSYCH: pleasant and cooperative, no obvious depression or anxiety  ASSESSMENT AND PLAN:  Discussed the following assessment and plan:  Chronic diastolic congestive heart failure  Uncontrolled type 2 diabetes mellitus with peripheral circulatory disorder  Hyperlipemia  Essential hypertension  Gastroesophageal reflux disease, esophagitis presence not specified  Edema -We reviewed the PMH, PSH, FH, SH, Meds and Allergies. -We provided refills for any medications we will prescribe as needed. -We addressed current concerns per orders and patient instructions. -We have asked for records for pertinent exams, studies, vaccines and notes from previous providers. -We have advised  patient to follow up per instructions below.   -Patient advised to return or notify a doctor immediately if symptoms worsen or persist or new concerns arise.  Patient Instructions  BEFORE YOU LEAVE: -prevnar 13 -set up [place] in 4 months     KIM, HANNAH R.

## 2014-11-23 NOTE — Patient Instructions (Signed)
BEFORE YOU LEAVE: -prevnar 13 -set up [place] in 4 months

## 2014-11-23 NOTE — Addendum Note (Signed)
Addended by: Agnes Lawrence on: 11/23/2014 11:56 AM   Modules accepted: Orders

## 2014-11-23 NOTE — Progress Notes (Signed)
Pre visit review using our clinic review tool, if applicable. No additional management support is needed unless otherwise documented below in the visit note. 

## 2014-11-28 ENCOUNTER — Other Ambulatory Visit: Payer: Self-pay | Admitting: Internal Medicine

## 2014-11-30 ENCOUNTER — Other Ambulatory Visit: Payer: Self-pay | Admitting: Internal Medicine

## 2014-12-24 ENCOUNTER — Encounter (HOSPITAL_COMMUNITY): Payer: Self-pay | Admitting: Neurosurgery

## 2015-01-01 ENCOUNTER — Other Ambulatory Visit: Payer: Self-pay | Admitting: Internal Medicine

## 2015-01-07 ENCOUNTER — Ambulatory Visit: Payer: Medicare Other | Admitting: Endocrinology

## 2015-01-13 ENCOUNTER — Encounter: Payer: Self-pay | Admitting: Internal Medicine

## 2015-01-13 ENCOUNTER — Ambulatory Visit (INDEPENDENT_AMBULATORY_CARE_PROVIDER_SITE_OTHER): Payer: Medicare Other | Admitting: Internal Medicine

## 2015-01-13 ENCOUNTER — Other Ambulatory Visit: Payer: Self-pay | Admitting: Internal Medicine

## 2015-01-13 VITALS — BP 118/62 | HR 109 | Temp 98.0°F | Resp 12 | Wt 183.4 lb

## 2015-01-13 DIAGNOSIS — IMO0002 Reserved for concepts with insufficient information to code with codable children: Secondary | ICD-10-CM

## 2015-01-13 DIAGNOSIS — E1151 Type 2 diabetes mellitus with diabetic peripheral angiopathy without gangrene: Secondary | ICD-10-CM

## 2015-01-13 DIAGNOSIS — E1159 Type 2 diabetes mellitus with other circulatory complications: Secondary | ICD-10-CM

## 2015-01-13 DIAGNOSIS — E1165 Type 2 diabetes mellitus with hyperglycemia: Principal | ICD-10-CM

## 2015-01-13 LAB — HEMOGLOBIN A1C: HEMOGLOBIN A1C: 9.2 % — AB (ref 4.6–6.5)

## 2015-01-13 MED ORDER — GLUCOSE BLOOD VI STRP: ORAL_STRIP | Status: DC

## 2015-01-13 NOTE — Patient Instructions (Signed)
Please continue: - Levemir 15 units at bedtime - NovoLog 15 min before a meal:  6 units before a small meal >> use this for b'fast  8 units before a regular meal   10 units before a large meal or if you also have dessert  Please stop at the lab.  Please come back for a follow-up appointment in 3 months with your sugar log.

## 2015-01-13 NOTE — Progress Notes (Signed)
Subjective:     Patient ID: [name], male   DOB: Mar 26, 1933, 81 y.o.   MRN: [id number]  HPI [name] is a very pleasant 81 y.o. man returning for management of DM2, dx 07/2012, uncontrolled, insulin-dependent, with complications (peripheral neuropathy, PAD). Last visit 2 mo ago.  Last HbA1C was: Lab Results  Component Value Date   HGBA1C 9.7* 08/25/2014   HGBA1C 9.7* 05/18/2014   HGBA1C 8.8* 02/04/2014   He was on a regimen of: - Metformin 1000 mg bid - Januvia 100 mg daily - Glipizide XL 10 mg  - Cycloset 1.6 mg  - Levemir 15 >> 13 units added 07/2014. We moved this to am  He is now on: - Metformin 1000 mg 2x a day - Levemir 15 units in hs - NovoLog insulin 15 min before the 3 main meals of the day:  6 units before a small meal  8 units before a regular meal 90% of the time  10 units before a large meal or if you also have dessert  Pt again brings a great log, he checks sugars ~2-4x a day - improved in last month: - am: 115-150 >> 150-272 >> 54, 62-121 >> 159-215, 236 >>176-210 >> 131-160 - 2h after b'fast: 251-302 >> 123-179 >> 191-239 >> 114-187 >> 84, 90-140 - before lunch: 246-324 >> 108-165, 186, 207 >> 150-160 >> 93-166, 228 >> 100-50 - 2h after lunch: 102, 179-215 >> 184-214 >> 94-177, 196 >> 133-160 - dinner: 111-181 >> 177-290 >> 152, 210 >> 136 >> 81-162 >> 139, 140, 176 - 2h after dinner: 137, 162-250 >> 197-230 >> 97, 104-186, 2x200 >> 92-161 - bedtime: 220-325 >> 142-250 >> 124, 176-248 >> 108-164 >> 138, 139 Had lows in am (47-60s) >> not any more.  - no CKD. Last BUN/Cr: Lab Results  Component Value Date   BUN 17 11/18/2014   CREATININE 0.68 11/18/2014  Last ACR was normal in 05/2013. - last lipid panel: Lab Results  Component Value Date   CHOL 111 02/04/2014   HDL 53.60 02/04/2014   LDLCALC 52 02/04/2014   TRIG 27.0 02/04/2014   CHOLHDL 2 02/04/2014  On Pravastatin 40. - He has peripheral neuropathy. He is taking a B complex vitamin,  and has a neuropathy cream. Seeing [name] at the [facility]. Last foot exam 03/05/2014. - Last eye exam 11/10/2014: no Bobby ([name]).   He had a heart catheterization on 09/03/2014 >> 2 small Blockages >> no intervention other than statins. 2D ECHO with normal EF.  I reviewed pt's medications, allergies, PMH, social hx, family hx and no changes required, except as above.   Review of Systems Constitutional: no weight changes, no fatigue, no subjective hyperthermia/hypothermia, + nocturia Eye: no blurry vision, no xerophthalmia ENT: no sore throat, no dysphagia/odynophagia/+ hoarseness Cardiovascular: no CP/SOB/no palpitations/leg swelling Respiratory: no cough/SOB Gastrointestinal: no N/V/D/C Musculoskeletal: + both: muscle/joint aches Skin: no rash Neuro: no tremors  Objective:   Physical Exam BP 118/62 mmHg  Pulse 109  Temp(Src) 98 F (36.7 C) (Oral)  Resp 12  Wt 183 lb 6.4 oz (83.19 kg)  SpO2 97% Body mass index is 26.32 kg/(m^2).  Wt Readings from Last 3 Encounters:  01/13/15 183 lb 6.4 oz (83.19 kg)  11/23/14 178 lb 3.2 oz (80.831 kg)  11/12/14 182 lb 9.6 oz (82.827 kg)  Constitutional: normal weight, in NAD Eyes: PERRLA, EOMI, no exophthalmos ENT: moist mucous membranes, no thyromegaly, no cervical lymphadenopathy Cardiovascular: RRR, No MRG, +  pitting leg edema B - wears compression hoses Respiratory: CTA B Gastrointestinal: abdomen soft, NT, ND, BS+ Skin: moist, warm, no rashes Neurological: no tremor with outstretched hands  Assessment:     1. DM2, non-insulin-dependent, uncontrolled, with complications - target K3K for him: 7-7.5% - peripheral neuropathy - PAD [name]  Plan:     1. Diabetes mellitus type 2, with improved control after adding mealtime insulin. Sugars lower after b'fast >> advised him to start taking 6 units instead of 8 units before that meal. - I advised him to: Patient Instructions  Please continue: - Levemir 15 units at  bedtime - NovoLog 15 min before a meal:  6 units before a small meal >> use this for b'fast  8 units before a regular meal   10 units before a large meal or if you also have dessert  Please stop at the lab.  Please come back for a follow-up appointment in 3 months with your sugar log.  - he is very conscientious about taking his medicines and keeping a good CBG log >> continue checking CBGs 3x a day - up to date with eye exams - does not have a computer at home so he cannot join my chart - Will check HbA1c then. - RTC in 3 mo with sugar log.  Office Visit on 01/13/2015  Component Date Value Ref Range Status  . Hgb A1c MFr Bld 01/13/2015 9.2* 4.6 - 6.5 % Final   Glycemic Control Guidelines for People with Diabetes:Non Diabetic:  <6%Goal of Therapy: <7%Additional Action Suggested:  >8%     Hba1c better, but I am very surprised it is not lowered in this! At next visit, I will consider checking a fructosamine level.

## 2015-01-18 ENCOUNTER — Other Ambulatory Visit: Payer: Self-pay | Admitting: *Deleted

## 2015-01-18 MED ORDER — METFORMIN HCL 1000 MG PO TABS: 1000.0000 mg | ORAL_TABLET | Freq: Two times a day (BID) | ORAL | Status: DC

## 2015-01-18 NOTE — Telephone Encounter (Signed)
I did not see anything in the note to continue the metformin. Please advise if ok to refill.

## 2015-01-20 ENCOUNTER — Other Ambulatory Visit: Payer: Self-pay | Admitting: Internal Medicine

## 2015-02-05 ENCOUNTER — Telehealth: Payer: Self-pay | Admitting: Family Medicine

## 2015-02-05 NOTE — Telephone Encounter (Signed)
Message no longer need to be sent

## 2015-02-08 ENCOUNTER — Telehealth: Payer: Self-pay | Admitting: Family Medicine

## 2015-02-08 NOTE — Telephone Encounter (Signed)
Patient's wife informed

## 2015-02-08 NOTE — Telephone Encounter (Signed)
Pt need a rx for Amoxicillin for pre dental . 4 tablets 1 hour prior to dental appt. Appt is 02/10/15. Office ask if something can be written saying pt had a knee replacement and is why he need to take this med.   [place]

## 2015-02-08 NOTE — Telephone Encounter (Signed)
Please advise - according to the Jan 2015 ADA (american dental association) guidelines - abx are no longer recommended prior to dental procedures in pts with hx of jt replacement. If dentist feels indicated despite this new guideline, based on dental health and dental procedures they are doing, or pt prefers, then would advise dentist rx antibiotic and dentist consult ortho if needed. Otherwise pt needs appt with me. Thank you.

## 2015-02-09 NOTE — Telephone Encounter (Signed)
Patient informed. 

## 2015-02-09 NOTE — Telephone Encounter (Signed)
Dentist office is asking the patient to bring in a letter stating that an abx is no longer needed . Pt has an appt tomorrow and need the letter today.

## 2015-02-09 NOTE — Telephone Encounter (Signed)
They would need this letter from ortho if they are concerned about following current guidelines. I did not evaluate this pt for his orthopedic or dental problems so this decision would not be mine to make.

## 2015-02-28 ENCOUNTER — Emergency Department (HOSPITAL_BASED_OUTPATIENT_CLINIC_OR_DEPARTMENT_OTHER): Payer: Medicare Other

## 2015-02-28 ENCOUNTER — Emergency Department (HOSPITAL_BASED_OUTPATIENT_CLINIC_OR_DEPARTMENT_OTHER)
Admission: EM | Admit: 2015-02-28 | Discharge: 2015-02-28 | Disposition: A | Discharge status: Home / Self Care | Payer: Medicare Other | Attending: Emergency Medicine | Admitting: Emergency Medicine

## 2015-02-28 ENCOUNTER — Encounter (HOSPITAL_BASED_OUTPATIENT_CLINIC_OR_DEPARTMENT_OTHER): Payer: Self-pay

## 2015-02-28 DIAGNOSIS — Y998 Other external cause status: Secondary | ICD-10-CM | POA: Insufficient documentation

## 2015-02-28 DIAGNOSIS — S8991XA Unspecified injury of right lower leg, initial encounter: Secondary | ICD-10-CM | POA: Insufficient documentation

## 2015-02-28 DIAGNOSIS — M1990 Unspecified osteoarthritis, unspecified site: Secondary | ICD-10-CM | POA: Insufficient documentation

## 2015-02-28 DIAGNOSIS — M7989 Other specified soft tissue disorders: Secondary | ICD-10-CM

## 2015-02-28 DIAGNOSIS — Z8619 Personal history of other infectious and parasitic diseases: Secondary | ICD-10-CM | POA: Diagnosis not present

## 2015-02-28 DIAGNOSIS — Z7982 Long term (current) use of aspirin: Secondary | ICD-10-CM | POA: Insufficient documentation

## 2015-02-28 DIAGNOSIS — I5032 Chronic diastolic (congestive) heart failure: Secondary | ICD-10-CM | POA: Insufficient documentation

## 2015-02-28 DIAGNOSIS — S79922A Unspecified injury of left thigh, initial encounter: Secondary | ICD-10-CM | POA: Diagnosis present

## 2015-02-28 DIAGNOSIS — Z79899 Other long term (current) drug therapy: Secondary | ICD-10-CM | POA: Insufficient documentation

## 2015-02-28 DIAGNOSIS — Z8701 Personal history of pneumonia (recurrent): Secondary | ICD-10-CM | POA: Insufficient documentation

## 2015-02-28 DIAGNOSIS — I10 Essential (primary) hypertension: Secondary | ICD-10-CM | POA: Diagnosis not present

## 2015-02-28 DIAGNOSIS — K219 Gastro-esophageal reflux disease without esophagitis: Secondary | ICD-10-CM | POA: Insufficient documentation

## 2015-02-28 DIAGNOSIS — I2510 Atherosclerotic heart disease of native coronary artery without angina pectoris: Secondary | ICD-10-CM | POA: Insufficient documentation

## 2015-02-28 DIAGNOSIS — S7012XA Contusion of left thigh, initial encounter: Secondary | ICD-10-CM | POA: Diagnosis not present

## 2015-02-28 DIAGNOSIS — Y92007 Garden or yard of unspecified non-institutional (private) residence as the place of occurrence of the external cause: Secondary | ICD-10-CM | POA: Diagnosis not present

## 2015-02-28 DIAGNOSIS — Z8669 Personal history of other diseases of the nervous system and sense organs: Secondary | ICD-10-CM | POA: Insufficient documentation

## 2015-02-28 DIAGNOSIS — W1839XA Other fall on same level, initial encounter: Secondary | ICD-10-CM | POA: Insufficient documentation

## 2015-02-28 DIAGNOSIS — Z86718 Personal history of other venous thrombosis and embolism: Secondary | ICD-10-CM | POA: Insufficient documentation

## 2015-02-28 DIAGNOSIS — Y9389 Activity, other specified: Secondary | ICD-10-CM | POA: Diagnosis not present

## 2015-02-28 DIAGNOSIS — Z794 Long term (current) use of insulin: Secondary | ICD-10-CM | POA: Diagnosis not present

## 2015-02-28 DIAGNOSIS — E785 Hyperlipidemia, unspecified: Secondary | ICD-10-CM | POA: Diagnosis not present

## 2015-02-28 DIAGNOSIS — E119 Type 2 diabetes mellitus without complications: Secondary | ICD-10-CM | POA: Diagnosis not present

## 2015-02-28 DIAGNOSIS — T148XXA Other injury of unspecified body region, initial encounter: Secondary | ICD-10-CM

## 2015-02-28 DIAGNOSIS — W19XXXA Unspecified fall, initial encounter: Secondary | ICD-10-CM

## 2015-02-28 NOTE — ED Provider Notes (Signed)
CSN: [id number]     Arrival date & time 02/28/15  1047 History   First MD Initiated Contact with Patient 02/28/15 1159     Chief Complaint  Patient presents with  . Fall    HPI   81 year old male presents 2 weeks status post fall. She reports that he was in his backyard when he fell landing on keys in his left pocket. Reports the fall was from a misstep; no dizziness chest pain shortness of breath loss of consciousness. She reports some minor pain to the left thigh followed by swelling and bruising he reports continued bruising down the lateral aspect of his left eye with the development of a tender area of swelling to the site of original impact. Patient reports that worse with palpation, not made worse by ambulation. He denies any other injuries including pain to his hip knees or lower extremities. No loss of distal sensation, vascular, neurological function of the involved extremity.denies swelling of the distal extremity. Main concern today is the increased swelling to the lateral aspect 2 weeks post fall. Patient denies blood thinner/anticoagulant therapy.  Patient additionally notes swelling to the lateral aspect of his right lower extremity.he reports the localized swelling but does not spread to the distal aspect of his foot ankle or lower leg. Denies loss of distal sensation and motor function or perfusion. Not associated with shortness of breath chest pain dizziness or any other concerning findings.  Patient denies any concerns and in addition to those noted above including: Headaches shortness of breath, chest pain, diaphoresis, nausea, vomiting, abdominal pain.   Past Medical History  Diagnosis Date  . Diabetes mellitus     type 2  . Diverticulosis of colon   . GERD (gastroesophageal reflux disease)   . Osteoarthritis   . Stress incontinence, male     urge  . Dyslipidemia   . Hernia, hiatal   . Hyperlipidemia   . Hypertension   . DVT (deep venous thrombosis)   . Overactive  bladder     Hx: of  . IBS (irritable bowel syndrome)     Hx: of  . Pneumonia 2002    history of  . Varicose veins of lower extremities with other complications 0/34/7425  . Atherosclerosis of native arteries of the extremities with intermittent claudication 02/10/2013  . DIVERTICULOSIS, COLON 06/13/2007    Qualifier: Diagnosis of  By: Leanne Chang MD, Bruce    . DEGENERATIVE JOINT DISEASE, GENERALIZED 01/24/2010    Annotation: diffuse pains of joints Qualifier: Diagnosis of  By: Leanne Chang MD, Bruce    . Shingles 09/11/2013  . Chronic diastolic congestive heart failure 11/23/2014  . CAD (coronary artery disease) 10/14/2014  . PERIPHERAL NEUROPATHY 05/30/2010    Annotation: likely DM related Qualifier: Diagnosis of  By: Leanne Chang MD, Bruce    . Edema 08/19/2014   Past Surgical History  Procedure Laterality Date  . Wrist tendon surgery    . Cts bilateral    . Knee arthroscopy  1988, 1992    bilateral  . Total knee arthroplasty  2005, 2007  . Lumbar left arm  1961  . Tonsillectomy    . Back surgery    . Colonoscopy      Hx: of  . Laminectomy  08/18/2013    DECOMPRESSIVE LAMINECTOMIES  . Lumbar laminectomy/decompression microdiscectomy N/A 08/18/2013    Procedure: Lumbar two-three, lumbar three-four, lumbar four-five decompressive laminectomies;  Surgeon: [name], MD;  Location: [place] [facility];  Service: Neurosurgery;  Laterality: N/A;  .  Left and right heart catheterization with coronary angiogram N/A 09/03/2014    Procedure: LEFT AND RIGHT HEART CATHETERIZATION WITH CORONARY ANGIOGRAM;  Surgeon: [name], MD;  Location: [facility] C[facility] LAB;  Service: Cardiovascular;  Laterality: N/A;   Family History  Problem Relation Age of Onset  . Other Mother 36    CABG  . Heart disease Mother   . Hypertension Mother   . Hyperlipidemia Son    History  Substance Use Topics  . Smoking status: Never Smoker   . Smokeless tobacco: Never Used  . Alcohol Use: No    Review of Systems  All other systems  reviewed and are negative.  Allergies  Ciprofloxacin and Sulfamethoxazole  Home Medications   Prior to Admission medications   Medication Sig Start Date End Date Taking? Authorizing Provider  amLODipine (NORVASC) 5 MG tablet Take 2.5 mg by mouth daily.    Historical Provider, MD  amLODipine (NORVASC) 5 MG tablet TAKE ONE TABLET BY MOUTH DAILY 01/03/15   [name], DO  aspirin 81 MG tablet Take 81 mg by mouth daily.     Historical Provider, MD  [place] 5-2.5-18.5 injection  08/25/14   Historical Provider, MD  CINNAMON PO Take 1,000 mg by mouth daily.    Historical Provider, MD  doxazosin (CARDURA) 4 MG tablet Take 2 mg by mouth daily.    Historical Provider, MD  furosemide (LASIX) 20 MG tablet Take 2 tablets (40 mg total) by mouth daily as needed. 11/12/14   Lelon Perla, MD  Glucosamine HCl (GLUCOSAMINE PO) Take 2,000 mg by mouth daily.    Historical Provider, MD  glucose blood (ONE TOUCH ULTRA TEST) test strip Use to test blood sugar 4  times daily as instructed. Dx code: E11.59 01/13/15   Philemon Kingdom, MD  insulin aspart (NOVOLOG FLEXPEN) 100 UNIT/ML FlexPen Inject 6-10 Units into the skin 3 (three) times daily with meals. 11/12/14   Philemon Kingdom, MD  Insulin Detemir (LEVEMIR) 100 UNIT/ML Pen Inject 13 Units into the skin daily before breakfast. 08/25/14   Philemon Kingdom, MD  Insulin Pen Needle (FIFTY50 PEN NEEDLES) 31G X 5 MM MISC Use 4x a day 10/07/14   Philemon Kingdom, MD  metFORMIN (GLUCOPHAGE) 1000 MG tablet Take 1 tablet (1,000 mg total) by mouth 2 (two) times daily with a meal. 01/18/15   Philemon Kingdom, MD  Multiple Vitamins-Minerals (CENTRUM SILVER PO) Take 1 tablet by mouth daily.     Historical Provider, MD  NOVOLOG FLEXPEN 100 UNIT/ML FlexPen INJECT 4-8 UNITS INTO THE SKIN 3 (THREE) TIMES DAILY WITH MEALS AS DIRECTED. 01/14/15   Philemon Kingdom, MD  omeprazole (PRILOSEC) 20 MG capsule TAKE 1 CAPSULE (20 MG TOTAL) BY MOUTH DAILY. 01/21/15   [name], DO  ONETOUCH  DELICA LANCETS 70J MISC Use to test blood sugar 3 times daily as instructed. Dx code: E11.59 11/30/14   Philemon Kingdom, MD  pravastatin (PRAVACHOL) 40 MG tablet Take 1 tablet (40 mg total) by mouth every evening. 10/14/14 10/14/15  Lelon Perla, MD  Propylene Glycol (SYSTANE BALANCE OP) Place 2 drops into both eyes daily as needed (for eyes).    Historical Provider, MD  TURMERIC PO Take 400 mg by mouth daily.    Historical Provider, MD   BP 122/55 mmHg  Pulse 84  Temp(Src) 98.2 F (36.8 C) (Oral)  Resp 18  Ht 5\' 10"  (1.778 m)  Wt 180 lb (81.647 kg)  BMI 25.83 kg/m2  SpO2 99% Physical Exam  Constitutional: He is oriented to person, place, and time. He appears well-developed and well-nourished.  HENT:  Head: Normocephalic and atraumatic.  Eyes: Pupils are equal, round, and reactive to light.  Neck: Normal range of motion. Neck supple. No JVD present. No tracheal deviation present. No thyromegaly present.  Cardiovascular: Normal rate, regular rhythm, normal heart sounds and intact distal pulses.  Exam reveals no gallop and no friction rub.   No murmur heard. Pulmonary/Chest: Effort normal and breath sounds normal. No stridor. No respiratory distress. He has no wheezes. He has no rales. He exhibits no tenderness.  Musculoskeletal: Normal range of motion.  Hematoma to the lateral aspect of the upper left thigh. Soft, mildly tender, no signs of infection including redness, swelling, warmth, skin change.  Right lateral lower extremity mildly tender to palpation, no signs of trauma, superficial veins, infection.  Lymphadenopathy:    He has no cervical adenopathy.  Neurological: He is alert and oriented to person, place, and time. Coordination normal.  Skin: Skin is warm and dry.  Psychiatric: He has a normal mood and affect. His behavior is normal. Judgment and thought content normal.  Nursing note and vitals reviewed.   ED Course  Procedures (including critical care time) Labs  Review Labs Reviewed - No data to display  Imaging Review Dg Pelvis 1-2 Views  02/28/2015   CLINICAL DATA:  Fall, bruising along the lateral aspect of leg  EXAM: PELVIS - 1-2 VIEW  COMPARISON:  None.  FINDINGS: No fracture or dislocation is seen.  Mild degenerative changes of the bilateral hips.  Visualized bony pelvis appears intact.  Moderate degenerative changes of the lower lumbar spine.  IMPRESSION: No fracture or dislocation is seen.  Degenerative changes of the bilateral hips and lower lumbar spine.   Electronically Signed   By: Julian Hy M.D.   On: 02/28/2015 11:23   Dg Femur Min 2 Views Left  02/28/2015   CLINICAL DATA:  Fall, bruising along the lateral aspect of leg  EXAM: LEFT FEMUR 2 VIEWS  COMPARISON:  None.  FINDINGS: No fracture or dislocation is seen.  Mild degenerative changes of the hip.  Left knee arthroplasty.  Visualized soft tissues are within normal limits.  IMPRESSION: No fracture or dislocation is seen.   Electronically Signed   By: Julian Hy M.D.   On: 02/28/2015 11:23     EKG Interpretation None      MDM   Final diagnoses:  Fall   Imaging: DG femur/DG pelvis- the no fracture dislocation seen Ultrasound venous lower right showed no evidence of right lower extremity DVT   Assessment/plan: Patient presents with hematoma to the left extremity no concern for DVT, infection, fracture.patient is advised to use warm compresses and ibuprofen/Tylenol as needed for pain relief. For new or worsening signs or symptoms and return immediately if any percent. Patient is advised follow-up with primary care for further evaluation and management. She understood and agreed with plan.      Okey Regal, PA-C 03/01/15 2951  Malvin Johns, MD 03/01/15 [phone]

## 2015-02-28 NOTE — ED Notes (Addendum)
Patient reports that he fell 2 weeks ago in yard landing on keys that were in left pocket, hematoma noted to leg with bruising, soreness reported. Tender to palpation, hematoma soft.

## 2015-02-28 NOTE — Discharge Instructions (Signed)
Please use warm compresses and ibuprofen as needed for pain. Patient is advised to monitor for new or worsening symptoms including lower extremity pain, swelling, cool to touch, loss of sensation. If any new or worsening symptoms present please return immediately to the emergency room.

## 2015-02-28 NOTE — ED Notes (Signed)
Pt appears comfortable, alert and oriented, x-rays completed, awaiting EDP evaluation. Call bell within reach, wife at bedside, comfort measures provided

## 2015-03-06 ENCOUNTER — Other Ambulatory Visit: Payer: Self-pay | Admitting: Internal Medicine

## 2015-03-09 ENCOUNTER — Other Ambulatory Visit: Payer: Self-pay | Admitting: Internal Medicine

## 2015-03-26 ENCOUNTER — Ambulatory Visit (INDEPENDENT_AMBULATORY_CARE_PROVIDER_SITE_OTHER): Payer: Medicare Other | Admitting: Family Medicine

## 2015-03-26 ENCOUNTER — Encounter: Payer: Self-pay | Admitting: Family Medicine

## 2015-03-26 VITALS — BP 116/82 | HR 65 | Temp 98.1°F | Ht 68.5 in | Wt 191.9 lb

## 2015-03-26 DIAGNOSIS — N3281 Overactive bladder: Secondary | ICD-10-CM

## 2015-03-26 DIAGNOSIS — I2583 Coronary atherosclerosis due to lipid rich plaque: Secondary | ICD-10-CM

## 2015-03-26 DIAGNOSIS — Z0000 Encounter for general adult medical examination without abnormal findings: Secondary | ICD-10-CM

## 2015-03-26 DIAGNOSIS — E1151 Type 2 diabetes mellitus with diabetic peripheral angiopathy without gangrene: Secondary | ICD-10-CM

## 2015-03-26 DIAGNOSIS — K219 Gastro-esophageal reflux disease without esophagitis: Secondary | ICD-10-CM

## 2015-03-26 DIAGNOSIS — E785 Hyperlipidemia, unspecified: Secondary | ICD-10-CM

## 2015-03-26 DIAGNOSIS — IMO0002 Reserved for concepts with insufficient information to code with codable children: Secondary | ICD-10-CM

## 2015-03-26 DIAGNOSIS — I2510 Atherosclerotic heart disease of native coronary artery without angina pectoris: Secondary | ICD-10-CM

## 2015-03-26 DIAGNOSIS — E1142 Type 2 diabetes mellitus with diabetic polyneuropathy: Secondary | ICD-10-CM

## 2015-03-26 DIAGNOSIS — I10 Essential (primary) hypertension: Secondary | ICD-10-CM

## 2015-03-26 DIAGNOSIS — E1165 Type 2 diabetes mellitus with hyperglycemia: Secondary | ICD-10-CM

## 2015-03-26 DIAGNOSIS — I5032 Chronic diastolic (congestive) heart failure: Secondary | ICD-10-CM

## 2015-03-26 DIAGNOSIS — R609 Edema, unspecified: Secondary | ICD-10-CM

## 2015-03-26 HISTORY — DX: Overactive bladder: N32.81

## 2015-03-26 LAB — BASIC METABOLIC PANEL
BUN: 14 mg/dL (ref 6–23)
CO2: 31 meq/L (ref 19–32)
CREATININE: 0.7 mg/dL (ref 0.40–1.50)
Calcium: 9.6 mg/dL (ref 8.4–10.5)
Chloride: 101 mEq/L (ref 96–112)
GFR: 115.49 mL/min (ref >60.00)
GLUCOSE: 163 mg/dL — AB (ref 70–99)
POTASSIUM: 4.4 meq/L (ref 3.5–5.1)
Sodium: 137 mEq/L (ref 135–145)

## 2015-03-26 LAB — LIPID PANEL
CHOL/HDL RATIO: 2
CHOLESTEROL: 99 mg/dL (ref 0–200)
HDL: 47.6 mg/dL (ref >39.00)
LDL Cholesterol: 43 mg/dL (ref 0–99)
NonHDL: 51.4
Triglycerides: 44 mg/dL (ref 0.0–149.0)
VLDL: 8.8 mg/dL (ref 0.0–40.0)

## 2015-03-26 NOTE — Progress Notes (Signed)
Pre visit review using our clinic review tool, if applicable. No additional management support is needed unless otherwise documented below in the visit note. 

## 2015-03-26 NOTE — Patient Instructions (Addendum)
BEFORE YOU LEAVE: -labs -follow appointment in 3-4 months  Use your cane and be careful when standing and walking to prevent falls  Check feet every day and ensure good foot care  We have ordered labs or studies at this visit. It can take up to 1-2 weeks for results and processing. We will contact you with instructions IF your results are abnormal. Normal results will be released to your [facility]. If you have not heard from [place] or can not find your results in [facility] in 2 weeks please contact our office.  We recommend the following healthy lifestyle measures: - eat a healthy diet consisting of lots of vegetables, fruits, beans, nuts, seeds, healthy meats such as white chicken and fish and whole grains.  - avoid fried foods, fast food, processed foods, sodas, red meet and other fattening foods.  - get a least 150 minutes of aerobic exercise per week.

## 2015-03-26 NOTE — Progress Notes (Signed)
HCPOA (wife) [phone]

## 2015-03-26 NOTE — Progress Notes (Signed)
Medicare Annual Preventive Care Visit  (initial annual wellness or annual wellness exam)  Concerns and/or follow up today:   [name] is a very pleasant 81 yo prior patient of Dr. Leanne Chang here for his Medicare Wellness Exam. He tripped in the yard about 1 month ago and bruised is lateral hip. Otherwise he reports he has been doing well.  Has the following chronic problems that require follow up and concerns today:  CAD/HTN/HLD/Edema/CdCHF: -managed by his cardiologist, recent note reviewed -echo 08/2014 with normal LV function, grade 1 diastolic dysfunction and mild mitral regurg per cardiology notes -cardiac cath 08/2014 30% left main, 40-50% LAD and no obstructive disease in the circumflex or right coronary artery. Prominent Wedge pressure 4. PA pressure 19 over 4 -meds: amlodipine 5, asa 81 mg, cardura 4 mg, lasix 20mg ,pravastatin 40 mg added last cardiology visit -denies: CP, SOB, syncope, palpitations, sig change in swelling or weight gain  DM, uncontrolled with peripheral neuropathy and PAD: -managed by his endocrinologist: reports has appt next month and checking labs then -reports fasting BS much improved -meds: reports taking levemir 15 units nightly, novolog 8-10 units with meals, metformin 1000mg  bid -wonders if 92 is too low for fasting BS -diet and exercise: no regular exercise going to start at; eating a healthy diet per his recommendation - reports insurance nurse is helping him with his meals -denies: wounds, vision loss, hypoglycemic symptoms -reports sees podiatrist -eye exam 11/2014  GERD: -meds: prilosec 20 mg daily -hx hiatal hernia -denies: dysphagia, worsening gerd, nausea, vomiting, weight loss  Overactive bladder: -reports sees a urologist for this -Dr. Patsy Baltimore per his report  Cyst posterior neck for many years: -stable, reports eval many years ago and stable   ROS: negative for report of fevers, unintentional weight loss, vision changes, vision  loss, hearing loss or change, chest pain, sob, hemoptysis, melena, hematochezia, hematuria, genital discharge or lesions, falls, bleeding or bruising, loc, thoughts of suicide or self harm, memory loss  1.) Patient-completed health risk assessment  - completed and reviewed, see scanned documentation  2.) Review of Medical History: -PMH, PSH, Family History and current specialty and care providers reviewed and updated and listed below  - see scanned in document in chart and below  Past Medical History  Diagnosis Date  . Diabetes mellitus     type 2  . Diverticulosis of colon   . GERD (gastroesophageal reflux disease)   . Osteoarthritis   . Stress incontinence, male     urge  . Dyslipidemia   . Hernia, hiatal   . Hyperlipidemia   . Hypertension   . DVT (deep venous thrombosis)   . Overactive bladder     Hx: of  . IBS (irritable bowel syndrome)     Hx: of  . Pneumonia 2002    history of  . Varicose veins of lower extremities with other complications 03/13/4741  . Atherosclerosis of native arteries of the extremities with intermittent claudication 02/10/2013  . DIVERTICULOSIS, COLON 06/13/2007    Qualifier: Diagnosis of  By: Leanne Chang MD, Bruce    . DEGENERATIVE JOINT DISEASE, GENERALIZED 01/24/2010    Annotation: diffuse pains of joints Qualifier: Diagnosis of  By: Leanne Chang MD, Bruce    . Shingles 09/11/2013  . Chronic diastolic congestive heart failure 11/23/2014  . CAD (coronary artery disease) 10/14/2014  . PERIPHERAL NEUROPATHY 05/30/2010    Annotation: likely DM related Qualifier: Diagnosis of  By: Leanne Chang MD, Bruce    . Edema 08/19/2014    Past  Surgical History  Procedure Laterality Date  . Wrist tendon surgery    . Cts bilateral    . Knee arthroscopy  1988, 1992    bilateral  . Total knee arthroplasty  2005, 2007  . Lumbar left arm  1961  . Tonsillectomy    . Back surgery    . Colonoscopy      Hx: of  . Laminectomy  08/18/2013    DECOMPRESSIVE LAMINECTOMIES  . Lumbar  laminectomy/decompression microdiscectomy N/A 08/18/2013    Procedure: Lumbar two-three, lumbar three-four, lumbar four-five decompressive laminectomies;  Surgeon: [name], MD;  Location: [facility];  Service: Neurosurgery;  Laterality: N/A;  . Left and right heart catheterization with coronary angiogram N/A 09/03/2014    Procedure: LEFT AND RIGHT HEART CATHETERIZATION WITH CORONARY ANGIOGRAM;  Surgeon: [name], MD;  [facility] CATH [facility];  Service: Cardiovascular;  Laterality: N/A;    History   Social History  . Marital Status: Married    Spouse Name: N/A  . Number of Children: 2  . Years of Education: N/A   Occupational History  . Not on file.   Social History Main Topics  . Smoking status: Never Smoker   . Smokeless tobacco: Never Used  . Alcohol Use: No  . Drug Use: No  . Sexual Activity: Not on file   Other Topics Concern  . 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 NO  History of falling? YES  Any trouble with IADLs - using a phone, using transportation, grocery shopping, preparing meals, doing housework, doing laundry, taking medications and managing money? NO  Advance Directives? YES   See summary of recommendations in Patient Instructions below.  4.) Physical Exam Filed Vitals:   03/26/15 0809  BP: 116/82  Pulse: 65  Temp: 98.1 F (36.7 C)   Estimated body mass index is 28.75 kg/(m^2) as calculated from the following:   Height as of this encounter: 5' 8.5" (1.74 m).   Weight as of this encounter: 191 lb 14.4 oz  (87.045 kg).  EKG (optional): deferred  General: alert, appear well hydrated and in no acute distress  HEENT: visual acuity grossly intact  CV: HRRR, tr peripheral EDEMA  Lungs: CTA bilaterally  Psych: pleasant and cooperative, no obvious depression or anxiety  Cog function grossly intact  FOOT exam done   See patient instructions for recommendations.  Education and counseling regarding the above review of health provided with a plan for the following: -see scanned patient completed form for further details -fall prevention strategies discussed  -healthy lifestyle discussed -importance and resources for completing advanced directives discussed -see patient instructions below for any other recommendations provided  4)The following written screening schedule of preventive measures were reviewed with assessment and plan made per below, orders and patient instructions:      AAA screening: n/a     Alcohol screening: none     Obesity Screening and counseling: done  STI screening: declined     Tobacco Screening: done       Pneumococcal (PPSV23 -one dose after 64, one before if risk factors), influenza yearly and hepatitis B vaccines (if high risk - end stage renal disease, IV drugs, homosexual men, live in home for mentally retarded, hemophilia receiving factors) ASSESSMENT/PLAN: done       Prostate cancer screening ASSESSMENT/PLAN: does with his urologist      Colorectal cancer screening (FOBT yearly or flex sig q4y or colonoscopy q10y or barium enema q4y) ASSESSMENT/PLAN: done - reports his gastroenterologist said to not do any further      Diabetes outpatient self-management training services ASSESSMENT/PLAN: sees endocrinologist      Screening for glaucoma(q1y if high risk - diabetes, FH, AA and > 34 or hispanic and > 77) ASSESSMENT/PLAN: sees optho yearly      Medical nutritional therapy for individuals with diabetes or renal disease ASSESSMENT/PLAN: sees endo,  down       Cardiovascular screening blood tests (lipids q5y) ASSESSMENT/PLAN: lipids today FASTING      Diabetes screening tests ASSESSMENT/PLAN: done   7.) Summary: -risk factors and conditions per above assessment were discussed and treatment, recommendations and referrals were offered per documentation above and orders and patient instructions.  Uncontrolled type 2 diabetes mellitus with peripheral circulatory disorder -uncontrolled, working on improvement with endocrinologist, encourage lifestyle changes and adherence to tx program  Chronic diastolic congestive heart failure Coronary artery disease due to lipid rich plaque Essential hypertension - Plan: Basic metabolic panel -lipid paned and BMP today -stable and asymptomatic today  Hyperlipemia - Plan: Lipid Panel  Diabetic peripheral neuropathy associated with type 2 diabetes mellitus -good foot care advised - sees podiatry  Gastroesophageal reflux disease, esophagitis presence not specified -stable  Edema -stable, advised elevation and compression  OAB (overactive bladder) -stable - see urologist  Patient Instructions  BEFORE YOU LEAVE: -labs -follow appointment in 3-4 months  Use your cane and be careful when standing and walking to prevent falls  Check feet every day and ensure good foot care  We have ordered labs or studies at this visit. It can take up to 1-2 weeks for results and processing. We will contact you with instructions IF your results are abnormal. Normal results will be released to your [facility]. If you have not heard from [place] or can not find your results in [facility] in 2 weeks please contact our office.  We recommend the following healthy lifestyle measures: - eat a healthy diet consisting of lots of vegetables, fruits, beans, nuts, seeds, healthy meats such as white chicken and fish and whole grains.  - avoid fried foods, fast food, processed foods, sodas, red meet and other fattening foods.  -  get a least 150 minutes of aerobic exercise per week.

## 2015-03-31 ENCOUNTER — Other Ambulatory Visit: Payer: Self-pay | Admitting: Internal Medicine

## 2015-04-01 ENCOUNTER — Other Ambulatory Visit: Payer: Self-pay | Admitting: Internal Medicine

## 2015-04-04 ENCOUNTER — Other Ambulatory Visit: Payer: Self-pay | Admitting: Internal Medicine

## 2015-04-05 NOTE — Telephone Encounter (Signed)
Spoke with the pt and he will check his bottles and call back with the correct dosage.

## 2015-04-05 NOTE — Telephone Encounter (Signed)
Can you confirm dosing of this with pt? It says 2mg  on the last order. Then sent refill - 90 days with 3 refills. Please erase any old orders of medication list. Thank you.

## 2015-04-09 NOTE — Telephone Encounter (Signed)
Patient left a message stating he is taking 4mg  daily.  Rx refill sent.

## 2015-04-13 ENCOUNTER — Other Ambulatory Visit: Payer: Self-pay | Admitting: *Deleted

## 2015-04-13 ENCOUNTER — Ambulatory Visit (INDEPENDENT_AMBULATORY_CARE_PROVIDER_SITE_OTHER): Payer: Medicare Other | Admitting: Internal Medicine

## 2015-04-13 ENCOUNTER — Encounter: Payer: Self-pay | Admitting: Internal Medicine

## 2015-04-13 VITALS — BP 112/60 | HR 57 | Temp 97.5°F | Resp 12 | Wt 190.6 lb

## 2015-04-13 DIAGNOSIS — E1165 Type 2 diabetes mellitus with hyperglycemia: Principal | ICD-10-CM

## 2015-04-13 DIAGNOSIS — E1159 Type 2 diabetes mellitus with other circulatory complications: Secondary | ICD-10-CM | POA: Diagnosis not present

## 2015-04-13 DIAGNOSIS — IMO0002 Reserved for concepts with insufficient information to code with codable children: Secondary | ICD-10-CM

## 2015-04-13 DIAGNOSIS — E1151 Type 2 diabetes mellitus with diabetic peripheral angiopathy without gangrene: Secondary | ICD-10-CM

## 2015-04-13 LAB — HEMOGLOBIN A1C: Hgb A1c MFr Bld: 8.9 % — ABNORMAL HIGH (ref 4.6–6.5)

## 2015-04-13 MED ORDER — INSULIN ASPART 100 UNIT/ML FLEXPEN: 8.0000 [IU] | PEN_INJECTOR | Freq: Three times a day (TID) | SUBCUTANEOUS | Status: DC

## 2015-04-13 MED ORDER — INSULIN DETEMIR 100 UNIT/ML FLEXPEN: PEN_INJECTOR | SUBCUTANEOUS | Status: DC

## 2015-04-13 MED ORDER — GLUCOSE BLOOD VI STRP: ORAL_STRIP | Status: DC

## 2015-04-13 MED ORDER — METFORMIN HCL 1000 MG PO TABS: 1000.0000 mg | ORAL_TABLET | Freq: Two times a day (BID) | ORAL | Status: DC

## 2015-04-13 MED ORDER — ONETOUCH DELICA LANCETS 33G MISC
Status: DC
Start: 2015-04-13 — End: 2016-03-17

## 2015-04-13 NOTE — Patient Instructions (Signed)
Please increase: - Levemir to 20 units at bedtime - NovoLog 15 min before a meal:  8 units before breakfast   10 units before lunch  10 units before dinner Please add 2 units if you have a larger meal. Please subtract 2 units if you plan to be active after a meal.  Please stop at the lab.  Please come back for a follow-up appointment in 3 months with your sugar log.

## 2015-04-13 NOTE — Progress Notes (Addendum)
Subjective:     Patient ID: [name], male   DOB: 04-Apr-1933, 81 y.o.   MRN: [id number]  HPI [name] is a very pleasant 81 y.o. man returning for management of DM2, dx 07/2012, uncontrolled, insulin-dependent, with complications (peripheral neuropathy, PAD). Last visit 3 mo ago.  Last HbA1C was: Lab Results  Component Value Date   HGBA1C 9.2* 01/13/2015   HGBA1C 9.7* 08/25/2014   HGBA1C 9.7* 05/18/2014   He was on a regimen of: - Metformin 1000 mg bid - Januvia 100 mg daily - Glipizide XL 10 mg  - Cycloset 1.6 mg  - Levemir 15 >> 13 units added 07/2014. We moved this to am  He is now on: - Metformin 1000 mg 2x a day - Levemir 15 units in hs - NovoLog insulin 15 min before the 3 main meals of the day (6-8-8 now):  6 units before a small meal  8 units before a regular meal 90% of the time  10 units before a large meal or if you also have dessert  Pt again brings a great log, he checks sugars ~2-4x a day - worse in last 3 months - unclear why: - am: 115-150 >> 150-272 >> 54, 62-121 >> 159-215, 236 >>176-210 >> 131-160 >> 112, 120, 140-190 - 2h after b'fast: 251-302 >> 123-179 >> 191-239 >> 114-187 >> 84, 90-140 >> 140-180 - before lunch: 246-324 >> 108-165, 186, 207 >> 150-160 >> 93-166, 228 >> 100-150 >> 142-170, 215 - 2h after lunch: 102, 179-215 >> 184-214 >> 94-177, 196 >> 133-160 >> n/c >> 110, 130 - dinner: 111-181 >> 177-290 >> 152, 210 >> 136 >> 81-162 >> 139, 140, 176 >> 105-220 - 2h after dinner: 137, 162-250 >> 197-230 >> 97, 104-186, 2x200 >> 92-161 >> 120-225 - bedtime: 220-325 >> 142-250 >> 124, 176-248 >> 108-164 >> 138, 139 >> 115-220 Had lows in am (47-60s) >> not any more.  - no CKD. Last BUN/Cr: Lab Results  Component Value Date   BUN 14 03/26/2015   CREATININE 0.70 03/26/2015  Last ACR was normal in 05/2013. - last lipid panel: Lab Results  Component Value Date   CHOL 99 03/26/2015   HDL 47.60 03/26/2015   LDLCALC 43 03/26/2015   TRIG  44.0 03/26/2015   CHOLHDL 2 03/26/2015  On Pravastatin 40. - He has peripheral neuropathy. He is taking a B complex vitamin, and has a neuropathy cream. [name]. Last foot exam 03/05/2014. - Last eye exam 11/10/2014: no Bobby ([name]).   He had a heart catheterization on 09/03/2014 >> 2 small Blockages >> no intervention other than statins. 2D ECHO with normal EF.  I reviewed pt's medications, allergies, PMH, social hx, family hx, and changes were documented in the history of present illness. Otherwise, unchanged from my initial visit note.   Review of Systems Constitutional: + weight gain,+ fatigue, no subjective hyperthermia/hypothermia, + nocturia Eye: no blurry vision, no xerophthalmia ENT: no sore throat, no dysphagia/odynophagia/hoarseness Cardiovascular: no CP/SOB/no palpitations/leg swelling Respiratory: no cough/SOB Gastrointestinal: no N/V/D/C Musculoskeletal: no muscle/joint aches Skin: no rash Neuro: no tremors  Objective:   Physical Exam BP 112/60 mmHg  Pulse 57  Temp(Src) 97.5 F (36.4 C) (Oral)  Resp 12  Wt 190 lb 9.6 oz (86.456 kg)  SpO2 97% Body mass index is 28.56 kg/(m^2).  Wt Readings from Last 3 Encounters:  04/13/15 190 lb 9.6 oz (86.456 kg)  03/26/15 191 lb 14.4 oz (87.045  kg)  02/28/15 180 lb (81.647 kg)  Constitutional: normal weight, in NAD Eyes: PERRLA, EOMI, no exophthalmos ENT: moist mucous membranes, no thyromegaly, no cervical lymphadenopathy Cardiovascular: RRR, No MRG, + pitting leg edema B - wears compression hoses Respiratory: CTA B Gastrointestinal: abdomen soft, NT, ND, BS+ Skin: moist, warm, no rashes Neurological: no tremor with outstretched hands  Assessment:     1. DM2, non-insulin-dependent, uncontrolled, with complications - target O1Y for him: 7-7.5% - peripheral neuropathy - PAD [name]  Plan:     1. Diabetes mellitus type 2, with worsened control after adding mealtime insulin.  Sugars higher than before without a good explanation. Will increase his insulin as follows: Patient Instructions  Please increase: - Levemir to 20 units at bedtime - NovoLog 15 min before a meal:  8 units before breakfast   10 units before lunch  10 units before dinner Please add 2 units if you have a larger meal. Please subtract 2 units if you plan to be active after a meal.  Please stop at the lab.  Please come back for a follow-up appointment in 3 months with your sugar log.  - he is very conscientious about taking his medicines and keeping a good CBG log >> continue checking CBGs 3x a day. Will give him a new meter as he feels his meter is giving errors. - I advised him to check sugars with the new meter for 2 days and only make the above changes in regimen if sugars similar to current ones - up to date with eye exams >> has blurry vision, may go back sooner than 11/2015 - does not have a computer at home so he cannot join my chart - Will check HbA1c + fructosamine now - RTC in 3 mo with sugar log.  Office Visit on 04/13/2015  Component Date Value Ref Range Status  . Hgb A1c MFr Bld 04/13/2015 8.9* 4.6 - 6.5 % Final   Glycemic Control Guidelines for People with Diabetes:Non Diabetic:  <6%Goal of Therapy: <7%Additional Action Suggested:  >8%    HbA1c better despite higher sugars.   Fructosamine 432 >> calculated hemoglobin A1c is congruent to the measured hemoglobin A1c, 8.9%. In his case, hemoglobin A1c check is accurate, so we do not need to check fructosamine again in the future.

## 2015-04-14 ENCOUNTER — Telehealth: Payer: Self-pay | Admitting: Internal Medicine

## 2015-04-14 NOTE — Telephone Encounter (Signed)
[facility] dated 04/13/15 5:08  Caller states that is from [facility]. States that prescription was written for a diffrent brand or test strips than the patient normally gets. Wants to know if this was intential or if should stick with what he has normally been getting.

## 2015-04-14 NOTE — Telephone Encounter (Signed)
Received a fax from [employer]. Faxed back advising them that pt had to get a new meter and the rx was intentional.

## 2015-04-15 ENCOUNTER — Telehealth: Payer: Self-pay | Admitting: Internal Medicine

## 2015-04-15 NOTE — Telephone Encounter (Signed)
Returned pt's call. Pt asked if it was ok to use his old lancets until he ran out. Advised pt yes. Pt also said to advise Dr Cruzita Lederer that his blood sugar has been in range since seeing her. Be advised.

## 2015-04-15 NOTE — Telephone Encounter (Signed)
Please call pt he has a question about if he can use a certain needle.

## 2015-04-15 NOTE — Telephone Encounter (Signed)
Great!

## 2015-04-16 ENCOUNTER — Encounter: Payer: Self-pay | Admitting: *Deleted

## 2015-04-16 LAB — FRUCTOSAMINE: Fructosamine: 432 umol/L — ABNORMAL HIGH (ref 190–270)

## 2015-04-20 ENCOUNTER — Other Ambulatory Visit: Payer: Self-pay | Admitting: Family Medicine

## 2015-04-27 ENCOUNTER — Other Ambulatory Visit: Payer: Self-pay | Admitting: Internal Medicine

## 2015-05-25 ENCOUNTER — Other Ambulatory Visit: Payer: Self-pay | Admitting: Internal Medicine

## 2015-06-24 ENCOUNTER — Other Ambulatory Visit: Payer: Self-pay | Admitting: Internal Medicine

## 2015-06-28 ENCOUNTER — Ambulatory Visit: Payer: Medicare Other | Admitting: Family Medicine

## 2015-07-01 ENCOUNTER — Ambulatory Visit (INDEPENDENT_AMBULATORY_CARE_PROVIDER_SITE_OTHER): Payer: Medicare Other | Admitting: Family Medicine

## 2015-07-01 ENCOUNTER — Encounter: Payer: Self-pay | Admitting: Family Medicine

## 2015-07-01 VITALS — BP 100/56 | HR 65 | Temp 98.1°F | Ht 68.5 in | Wt 195.4 lb

## 2015-07-01 DIAGNOSIS — E1159 Type 2 diabetes mellitus with other circulatory complications: Secondary | ICD-10-CM

## 2015-07-01 DIAGNOSIS — I10 Essential (primary) hypertension: Secondary | ICD-10-CM | POA: Diagnosis not present

## 2015-07-01 DIAGNOSIS — I2510 Atherosclerotic heart disease of native coronary artery without angina pectoris: Secondary | ICD-10-CM

## 2015-07-01 DIAGNOSIS — I5032 Chronic diastolic (congestive) heart failure: Secondary | ICD-10-CM

## 2015-07-01 DIAGNOSIS — E785 Hyperlipidemia, unspecified: Secondary | ICD-10-CM | POA: Diagnosis not present

## 2015-07-01 DIAGNOSIS — E1165 Type 2 diabetes mellitus with hyperglycemia: Principal | ICD-10-CM

## 2015-07-01 DIAGNOSIS — G629 Polyneuropathy, unspecified: Secondary | ICD-10-CM

## 2015-07-01 DIAGNOSIS — E1142 Type 2 diabetes mellitus with diabetic polyneuropathy: Secondary | ICD-10-CM

## 2015-07-01 DIAGNOSIS — M5136 Other intervertebral disc degeneration, lumbar region: Secondary | ICD-10-CM

## 2015-07-01 DIAGNOSIS — N3281 Overactive bladder: Secondary | ICD-10-CM

## 2015-07-01 DIAGNOSIS — E1151 Type 2 diabetes mellitus with diabetic peripheral angiopathy without gangrene: Secondary | ICD-10-CM

## 2015-07-01 DIAGNOSIS — I2583 Coronary atherosclerosis due to lipid rich plaque: Secondary | ICD-10-CM

## 2015-07-01 DIAGNOSIS — IMO0002 Reserved for concepts with insufficient information to code with codable children: Secondary | ICD-10-CM

## 2015-07-01 DIAGNOSIS — M51369 Other intervertebral disc degeneration, lumbar region without mention of lumbar back pain or lower extremity pain: Secondary | ICD-10-CM

## 2015-07-01 NOTE — Progress Notes (Signed)
HPI:  [name] is a very pleasant 81 yo prior patient of Dr. Leanne Chang here for  Follow up  CAD/HTN/HLD/Edema/CdCHF: -managed by his cardiologist, most recent note reviewed -echo 08/2014 with normal LV function, grade 1 diastolic dysfunction and mild mitral regurg per cardiology notes -cardiac cath 08/2014 30% left main, 40-50% LAD and no obstructive disease in the circumflex or right coronary artery. Prominent Wedge pressure 4. PA pressure 19 over 4 -meds: amlodipine 5, asa 81 mg, cardura 4 mg, lasix 20mg ,pravastatin 40 mg added last cardiology visit and this has caused some leg cramps in upper legs -denies: CP, SOB, syncope, palpitations, sig change in swelling or weight gain  DM, uncontrolled with peripheral neuropathy and PAD: -managed by his endocrinologist: reviewed most recent notes, titrating insulin -reports fasting BS much improved - around 120, checks before bed and usually around 130-190s depending on what he eats -meds: reports taking levemir 20 units nightly, novolog 6-8 units with meals, metformin 1000mg  bid -diet and exercise:  Diet is not great; he is walking and trying to get regular exercise -denies: wounds, vision loss, hypoglycemic symptoms -reports sees podiatrist -eye exam 11/2014  GERD: -meds: prilosec 20 mg daily -hx hiatal hernia -denies: dysphagia, worsening gerd, nausea, vomiting, weight loss  Overactive bladder: -reports sees a urologist for this -Dr. Patsy Baltimore per his report  DDD/Spinal stenosis: -reports s/p surgery with Dr. Arnoldo Morale 2 years ago - was having some R sided LE weakness at that times, also has noticed some L sided tingling in forearm and lateral hand - has cervical DDDmod - severe as well -reports seemed to improve after surgery but recurring some recently the last 4-5 months, occ sensation of weakness in R lower leg, walks with a cane chronically, has peripheral neuropathy chronic with chronic tingling in feet -denies: fevers, chills,  pain, bowel or bladder incontinence, numbness  ROS: negative for report of fevers, unintentional weight loss, vision changes, vision loss, hearing loss or change, chest pain, sob, hemoptysis, melena, hematochezia, hematuria, genital discharge or lesions, falls, bleeding or bruising, loc, thoughts of suicide or self harm, memory loss  ROS: See pertinent positives and negatives per HPI.  Past Medical History  Diagnosis Date  . Diabetes mellitus     type 2  . Diverticulosis of colon   . GERD (gastroesophageal reflux disease)   . Osteoarthritis   . Stress incontinence, male     urge  . Dyslipidemia   . Hernia, hiatal   . Hyperlipidemia   . Hypertension   . DVT (deep venous thrombosis)   . Overactive bladder     Hx: of  . IBS (irritable bowel syndrome)     Hx: of  . Pneumonia 2002    history of  . Varicose veins of lower extremities with other complications 07/05/3663  . Atherosclerosis of native arteries of the extremities with intermittent claudication 02/10/2013  . DIVERTICULOSIS, COLON 06/13/2007    Qualifier: Diagnosis of  By: Leanne Chang MD, Bruce    . DEGENERATIVE JOINT DISEASE, GENERALIZED 01/24/2010    Annotation: diffuse pains of joints Qualifier: Diagnosis of  By: Leanne Chang MD, Bruce    . Shingles 09/11/2013  . Chronic diastolic congestive heart failure 11/23/2014  . CAD (coronary artery disease) 10/14/2014  . PERIPHERAL NEUROPATHY 05/30/2010    Annotation: likely DM related Qualifier: Diagnosis of  By: Leanne Chang MD, Bruce    . Edema 08/19/2014    Past Surgical History  Procedure Laterality Date  . Wrist tendon surgery    . Cts  bilateral    . Knee arthroscopy  1988, 1992    bilateral  . Total knee arthroplasty  2005, 2007  . Lumbar left arm  1961  . Tonsillectomy    . Back surgery    . Colonoscopy      Hx: of  . Laminectomy  08/18/2013    DECOMPRESSIVE LAMINECTOMIES  . Lumbar laminectomy/decompression microdiscectomy N/A 08/18/2013    Procedure: Lumbar two-three, lumbar  three-four, lumbar four-five decompressive laminectomies;  Surgeon: [name], MD;  Location: [facility];  Service: Neurosurgery;  Laterality: N/A;  . Left and right heart catheterization with coronary angiogram N/A 09/03/2014    Procedure: LEFT AND RIGHT HEART CATHETERIZATION WITH CORONARY ANGIOGRAM;  Surgeon: [name], MD;  Location: [facility] CATH LAB;  Service: Cardiovascular;  Laterality: N/A;    Family History  Problem Relation Age of Onset  . Other Mother 3    CABG  . Heart disease Mother   . Hypertension Mother   . Hyperlipidemia Son     History   Social History  . Marital Status: Married    Spouse Name: N/A  . Number of Children: 2  . Years of Education: N/A   Social History Main Topics  . Smoking status: Never Smoker   . Smokeless tobacco: Never Used  . Alcohol Use: No  . Drug Use: No  . Sexual Activity: Not on file   Other Topics Concern  . 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  amLODipine (NORVASC) 5 MG tablet, Take 2.5 mg by mouth daily., Disp: , Rfl:  .  aspirin 81 MG tablet, Take 81 mg by mouth daily. , Disp: , Rfl:  .  B-D UF III MINI PEN NEEDLES 31G X 5 MM MISC, USE 4X A DAY AS DIRECTED, Disp: 200 each, Rfl: 2 .  doxazosin (CARDURA) 4 MG tablet, Take 2 mg by mouth daily., Disp: , Rfl:  .  doxazosin (CARDURA) 4 MG tablet, TAKE ONE TABLET BY MOUTH DAILY, Disp: 90 tablet, Rfl: 2 .  furosemide (LASIX) 20 MG tablet, Take 2 tablets (40 mg total) by mouth daily as needed., Disp: 60 tablet, Rfl: 11 .  glucose blood (ONETOUCH VERIO) test strip, Use to test blood sugar 3 times daily as instructed. Dx: E11.59, Disp: 100  each, Rfl: 5 .  insulin aspart (NOVOLOG FLEXPEN) 100 UNIT/ML FlexPen, Inject 8-12 Units into the skin 3 (three) times daily with meals., Disp: 15 mL, Rfl: 2 .  Insulin Detemir (LEVEMIR FLEXTOUCH) 100 UNIT/ML Pen, INJECT 20 UNITS INTO THE SKIN DAILY AT 10 PM., Disp: 15 mL, Rfl: 2 .  metFORMIN (GLUCOPHAGE) 1000 MG tablet, Take 1 tablet (1,000 mg total) by mouth 2 (two) times daily with a meal., Disp: 180 tablet, Rfl: 3 .  Multiple Vitamins-Minerals (CENTRUM SILVER PO), Take 1 tablet by mouth daily. , Disp: , Rfl:  .  omeprazole (PRILOSEC) 20 MG capsule, TAKE 1 CAPSULE (20 MG TOTAL) BY MOUTH DAILY., Disp: 90 capsule, Rfl: 0 .  ONETOUCH DELICA LANCETS 42P MISC, Use to test  blood sugar 3 times daily as instructed. Dx code: E11.59, Disp: 300 each, Rfl: 3 .  pravastatin (PRAVACHOL) 40 MG tablet, Take 1 tablet (40 mg total) by mouth every evening., Disp: 30 tablet, Rfl: 11 .  Propylene Glycol (SYSTANE BALANCE OP), Place 2 drops into both eyes daily as needed (for eyes)., Disp: , Rfl:   EXAM:  Filed Vitals:   07/01/15 1033  BP: 100/56  Pulse: 65  Temp: 98.1 F (36.7 C)    Body mass index is 29.28 kg/(m^2).  GENERAL: vitals reviewed and listed above, alert, oriented, appears well hydrated and in no acute distress  HEENT: atraumatic, conjunttiva clear, no obvious abnormalities on inspection of external nose and ears  NECK: no obvious masses on inspection  LUNGS: clear to auscultation bilaterally, no wheezes, rales or rhonchi, good air movement  CV: HRRR, no peripheral edema  MS/NEURO: moves all extremities without noticeable abnormality, walks with a cane and avoids total weight bearing on R knee, normal sensation to light touch in UEs and LEs bilaterally, no bony TTP in neck or low back, patellar DTRs not elicited despite distraction in other DTRs equal, strength preserved throughout except for in dorsiflexion of R foot, SLRT and CLRT ok, FABER and FADIR with some pain in low back but neg  otherwise, spurling neg  PSYCH: pleasant and cooperative, no obvious depression or anxiety  ASSESSMENT AND PLAN:  Discussed the following assessment and plan:  Uncontrolled type 2 diabetes mellitus with peripheral circulatory disorder -improving  Hyperlipemia -having cramps he feel may be attributable to the statin, opted to do trial 1/2 or alt day dosing, possible trial off to see if resolves, discuss other potential etiologies   Diabetic peripheral neuropathy associated with type 2 diabetes mellitus -stable  Essential hypertension -stable  Coronary artery disease due to lipid rich plaque -sees cardiologist  OAB (overactive bladder) -stable, sees urologist  Chronic diastolic congestive heart failure -stable, sees cardiologist  DDD (degenerative disc disease), lumbar -he seems to be having recurrence of issues likely related to his significant DDD/spinal stenosis for the last few months, he has opted to contact his managing back doctor today regarding this - advised he call here if need re-referral or if needs assistance seeing his doctor  -Patient advised to return or notify a doctor immediately if symptoms worsen or persist or new concerns arise.  Patient Instructions  BEFORE YOU LEAVE: -follow up appointment in 4 months  Schedule an appointment with your back doctor - call today  Please trying 1/2 dose of the statin or every other day dosing to see if this helps with leg cramps  We recommend the following healthy lifestyle measures: - eat a healthy diet consisting of lots of vegetables, fruits, beans, nuts, seeds, healthy meats such as white chicken and fish and whole grains.  - avoid fried foods, fast food, processed foods, sodas, red meet and other fattening foods.  - get a least 150 minutes of aerobic exercise per week.       Colin Benton R.

## 2015-07-01 NOTE — Patient Instructions (Addendum)
BEFORE YOU LEAVE: -follow up appointment in 4 months  Schedule an appointment with your back doctor - call today  Please trying 1/2 dose of the statin or every other day dosing to see if this helps with leg cramps  We recommend the following healthy lifestyle measures: - eat a healthy diet consisting of lots of vegetables, fruits, beans, nuts, seeds, healthy meats such as white chicken and fish and whole grains.  - avoid fried foods, fast food, processed foods, sodas, red meet and other fattening foods.  - get a least 150 minutes of aerobic exercise per week.

## 2015-07-01 NOTE — Progress Notes (Signed)
Pre visit review using our clinic review tool, if applicable. No additional management support is needed unless otherwise documented below in the visit note. 

## 2015-07-05 ENCOUNTER — Telehealth: Payer: Self-pay | Admitting: Family Medicine

## 2015-07-05 NOTE — Telephone Encounter (Signed)
FYI pt has an appt with dr Newman Pies NS on 09-07-15 for back pain.

## 2015-07-14 ENCOUNTER — Other Ambulatory Visit (INDEPENDENT_AMBULATORY_CARE_PROVIDER_SITE_OTHER): Payer: Medicare Other | Admitting: *Deleted

## 2015-07-14 ENCOUNTER — Encounter: Payer: Self-pay | Admitting: Internal Medicine

## 2015-07-14 ENCOUNTER — Ambulatory Visit (INDEPENDENT_AMBULATORY_CARE_PROVIDER_SITE_OTHER): Payer: Medicare Other | Admitting: Internal Medicine

## 2015-07-14 VITALS — BP 110/62 | HR 64 | Temp 97.7°F | Resp 16 | Ht 70.0 in | Wt 194.0 lb

## 2015-07-14 DIAGNOSIS — E1151 Type 2 diabetes mellitus with diabetic peripheral angiopathy without gangrene: Secondary | ICD-10-CM

## 2015-07-14 DIAGNOSIS — E1159 Type 2 diabetes mellitus with other circulatory complications: Secondary | ICD-10-CM | POA: Diagnosis not present

## 2015-07-14 DIAGNOSIS — E1165 Type 2 diabetes mellitus with hyperglycemia: Principal | ICD-10-CM

## 2015-07-14 DIAGNOSIS — IMO0002 Reserved for concepts with insufficient information to code with codable children: Secondary | ICD-10-CM

## 2015-07-14 LAB — POCT GLYCOSYLATED HEMOGLOBIN (HGB A1C): Hemoglobin A1C: 7.7

## 2015-07-14 NOTE — Patient Instructions (Signed)
Please continue: - Metformin 1000 mg 2x a day - Levemir 20 units at bedtime - NovoLog 15 min before a meal:  6 units before breakfast   8 units before lunch  8 units before dinner Please add 2 units if you have a larger meal. Please subtract 2 units if you plan to be active after a meal.  Please return in 3 months with your sugar log.

## 2015-07-14 NOTE — Progress Notes (Signed)
Subjective:     Patient ID: [name], male   DOB: [birth year], 81 y.o.   MRN: [id number]  HPI [name] is a very pleasant 81 y.o. man returning for management of DM2, dx 07/2012, uncontrolled, insulin-dependent, with complications (peripheral neuropathy, PAD). Last visit 3 mo ago.  Last HbA1C was: Lab Results  Component Value Date   HGBA1C 7.7 07/14/2015   HGBA1C 8.9* 04/13/2015   HGBA1C 9.2* 01/13/2015   He was on a regimen of: - Metformin 1000 mg bid - Januvia 100 mg daily - Glipizide XL 10 mg  - Cycloset 1.6 mg  - Levemir 15 >> 13 units added 07/2014. We moved this to am  He is now on: - Metformin 1000 mg 2x a day - Levemir 20 units at bedtime - NovoLog 15 min before a meal:  8 >> 6 units before breakfast   10 >> 8 units before lunch  10 >> 8 units before dinner Please add 2 units if you have a larger meal. Please subtract 2 units if you plan to be active after a meal.  Pt again brings a great log, he checks sugars ~2-4x a day - greatly improved: - am: 115-150 >> 150-272 >> 54, 62-121 >> 159-215, 236 >>176-210 >> 131-160 >> 112, 120, 140-190 >> 103-140, 160 - 2h after b'fast: 251-302 >> 123-179 >> 191-239 >> 114-187 >> 84, 90-140 >> 140-180 >> 104-156 - before lunch: 246-324 >> 108-165, 186, 207 >> 150-160 >> 93-166, 228 >> 100-150 >> 142-170, 215 >> 97-148 - 2h after lunch: 102, 179-215 >> 184-214 >> 94-177, 196 >> 133-160 >> n/c >> 110, 130 >> 138-160 - dinner: 111-181 >> 177-290 >> 152, 210 >> 136 >> 81-162 >> 139, 140, 176 >> 105-220 >> 108-130, 183 - 2h after dinner: 137, 162-250 >> 197-230 >> 97, 104-186, 2x200 >> 92-161 >> 120-225 >> 107-180 - bedtime: 220-325 >> 142-250 >> 124, 176-248 >> 108-164 >> 138, 139 >> 115-220 >> 105-161 Had lows in am (47-60s) >> now high 60s - rarely  - no CKD. Last BUN/Cr: Lab Results  Component Value Date   BUN 14 03/26/2015   CREATININE 0.70 03/26/2015  Last ACR was normal in 05/2013. - last lipid panel: Lab Results   Component Value Date   CHOL 99 03/26/2015   HDL 47.60 03/26/2015   LDLCALC 43 03/26/2015   TRIG 44.0 03/26/2015   CHOLHDL 2 03/26/2015  On Pravastatin 40. - He has peripheral neuropathy. He is taking a B complex vitamin, and has a neuropathy cream. [name]. Last foot exam 03/05/2014. - Last eye exam 11/10/2014: no Bobby ([name]). He will have a new appt 07/26/2015.  He had a heart catheterization on 09/03/2014 >> 2 small Blockages >> no intervention other than statins. 2D ECHO with normal EF.  I reviewed pt's medications, allergies, PMH, social hx, family hx, and changes were documented in the history of present illness. Otherwise, unchanged from my initial visit note.   Review of Systems Constitutional: no weight gain, no fatigue, no subjective hyperthermia/hypothermia Eye: + blurry vision, no xerophthalmia ENT: no sore throat, no dysphagia/odynophagia/hoarseness Cardiovascular: no CP/SOB/no palpitations/leg swelling Respiratory: no cough/SOB Gastrointestinal: no N/V/D/C Musculoskeletal: no muscle/joint aches Skin: no rash Neuro: no tremors; + weakness in legs R>L , numbness in L upper arm - lateral aspect (will see ortho soon)  Objective:   Physical Exam BP 110/62 mmHg  Pulse 64  Temp(Src) 97.7 F (36.5 C) (Oral)  Resp 16  Ht 5\' 10"  (1.778 m)  Wt 194 lb (87.998 kg)  BMI 27.84 kg/m2  SpO2 97% Body mass index is 27.84 kg/(m^2).  Wt Readings from Last 3 Encounters:  07/14/15 194 lb (87.998 kg)  07/01/15 195 lb 6.4 oz (88.633 kg)  04/13/15 190 lb 9.6 oz (86.456 kg)  Constitutional: normal weight, in NAD Eyes: PERRLA, EOMI, no exophthalmos ENT: moist mucous membranes, no thyromegaly, no cervical lymphadenopathy Cardiovascular: RRR, + SEM 2/6, no RG, + leg edema L>R Respiratory: CTA B Gastrointestinal: abdomen soft, NT, ND, BS+ Skin: moist, warm, no rashes Neurological: no tremor with outstretched hands  Assessment:     1. DM2,  non-insulin-dependent, uncontrolled, with complications - target R9X for him: 7-7.5% - peripheral neuropathy - PAD [name]  Plan:     1. Diabetes mellitus type 2, with improved control after adding mealtime insulin. Sugars are all improved. Will continue current regimen: - I advised him to: Patient Instructions  Please continue: - Metformin 1000 mg 2x a day - Levemir 20 units at bedtime - NovoLog 15 min before a meal:  6 units before breakfast   8 units before lunch  8 units before dinner Please add 2 units if you have a larger meal. Please subtract 2 units if you plan to be active after a meal.  Please return in 3 months with your sugar log.   - he is very conscientious about taking his medicines and keeping a good CBG log >> continue checking CBGs 3x a day.  - up to date with eye exams  - does not have a computer at home so he cannot join my chart - Will check HbA1c today >> 7.7% (improved!) - RTC in 3 mo with sugar log.

## 2015-07-26 LAB — HM DIABETES EYE EXAM

## 2015-07-29 ENCOUNTER — Other Ambulatory Visit: Payer: Self-pay | Admitting: Internal Medicine

## 2015-08-26 ENCOUNTER — Inpatient Hospital Stay (HOSPITAL_COMMUNITY)
Admission: EM | Admit: 2015-08-26 | Discharge: 2015-08-31 | DRG: 871 | Disposition: A | Discharge status: Home Health | Payer: Medicare Other | Attending: Internal Medicine | Admitting: Internal Medicine

## 2015-08-26 ENCOUNTER — Encounter: Payer: Self-pay | Admitting: Family Medicine

## 2015-08-26 ENCOUNTER — Ambulatory Visit (INDEPENDENT_AMBULATORY_CARE_PROVIDER_SITE_OTHER): Payer: Medicare Other | Admitting: Family Medicine

## 2015-08-26 ENCOUNTER — Inpatient Hospital Stay (HOSPITAL_COMMUNITY): Payer: Medicare Other

## 2015-08-26 ENCOUNTER — Encounter (HOSPITAL_COMMUNITY): Payer: Self-pay

## 2015-08-26 ENCOUNTER — Emergency Department (HOSPITAL_COMMUNITY): Payer: Medicare Other

## 2015-08-26 VITALS — BP 118/58 | HR 83 | Temp 100.1°F | Ht 68.5 in | Wt 192.3 lb

## 2015-08-26 DIAGNOSIS — Z96659 Presence of unspecified artificial knee joint: Secondary | ICD-10-CM | POA: Diagnosis present

## 2015-08-26 DIAGNOSIS — Z79899 Other long term (current) drug therapy: Secondary | ICD-10-CM

## 2015-08-26 DIAGNOSIS — I70219 Atherosclerosis of native arteries of extremities with intermittent claudication, unspecified extremity: Secondary | ICD-10-CM | POA: Diagnosis present

## 2015-08-26 DIAGNOSIS — J189 Pneumonia, unspecified organism: Secondary | ICD-10-CM | POA: Diagnosis present

## 2015-08-26 DIAGNOSIS — E785 Hyperlipidemia, unspecified: Secondary | ICD-10-CM | POA: Diagnosis present

## 2015-08-26 DIAGNOSIS — A419 Sepsis, unspecified organism: Secondary | ICD-10-CM | POA: Diagnosis present

## 2015-08-26 DIAGNOSIS — K529 Noninfective gastroenteritis and colitis, unspecified: Secondary | ICD-10-CM | POA: Diagnosis present

## 2015-08-26 DIAGNOSIS — E1151 Type 2 diabetes mellitus with diabetic peripheral angiopathy without gangrene: Secondary | ICD-10-CM | POA: Diagnosis present

## 2015-08-26 DIAGNOSIS — N3281 Overactive bladder: Secondary | ICD-10-CM | POA: Diagnosis present

## 2015-08-26 DIAGNOSIS — Z8249 Family history of ischemic heart disease and other diseases of the circulatory system: Secondary | ICD-10-CM | POA: Diagnosis not present

## 2015-08-26 DIAGNOSIS — Z794 Long term (current) use of insulin: Secondary | ICD-10-CM | POA: Diagnosis not present

## 2015-08-26 DIAGNOSIS — Z882 Allergy status to sulfonamides status: Secondary | ICD-10-CM | POA: Diagnosis not present

## 2015-08-26 DIAGNOSIS — I2510 Atherosclerotic heart disease of native coronary artery without angina pectoris: Secondary | ICD-10-CM | POA: Diagnosis present

## 2015-08-26 DIAGNOSIS — I8393 Asymptomatic varicose veins of bilateral lower extremities: Secondary | ICD-10-CM | POA: Diagnosis present

## 2015-08-26 DIAGNOSIS — I10 Essential (primary) hypertension: Secondary | ICD-10-CM | POA: Diagnosis present

## 2015-08-26 DIAGNOSIS — E1165 Type 2 diabetes mellitus with hyperglycemia: Secondary | ICD-10-CM | POA: Diagnosis present

## 2015-08-26 DIAGNOSIS — M1990 Unspecified osteoarthritis, unspecified site: Secondary | ICD-10-CM | POA: Diagnosis present

## 2015-08-26 DIAGNOSIS — R509 Fever, unspecified: Secondary | ICD-10-CM

## 2015-08-26 DIAGNOSIS — R05 Cough: Secondary | ICD-10-CM

## 2015-08-26 DIAGNOSIS — Z881 Allergy status to other antibiotic agents status: Secondary | ICD-10-CM | POA: Diagnosis not present

## 2015-08-26 DIAGNOSIS — I5032 Chronic diastolic (congestive) heart failure: Secondary | ICD-10-CM | POA: Diagnosis present

## 2015-08-26 DIAGNOSIS — Z7982 Long term (current) use of aspirin: Secondary | ICD-10-CM

## 2015-08-26 DIAGNOSIS — E1142 Type 2 diabetes mellitus with diabetic polyneuropathy: Secondary | ICD-10-CM | POA: Diagnosis present

## 2015-08-26 DIAGNOSIS — R1033 Periumbilical pain: Secondary | ICD-10-CM | POA: Diagnosis not present

## 2015-08-26 DIAGNOSIS — Z8701 Personal history of pneumonia (recurrent): Secondary | ICD-10-CM

## 2015-08-26 DIAGNOSIS — R531 Weakness: Secondary | ICD-10-CM | POA: Diagnosis not present

## 2015-08-26 DIAGNOSIS — K219 Gastro-esophageal reflux disease without esophagitis: Secondary | ICD-10-CM | POA: Diagnosis present

## 2015-08-26 DIAGNOSIS — IMO0002 Reserved for concepts with insufficient information to code with codable children: Secondary | ICD-10-CM | POA: Diagnosis present

## 2015-08-26 DIAGNOSIS — J181 Lobar pneumonia, unspecified organism: Secondary | ICD-10-CM

## 2015-08-26 DIAGNOSIS — R109 Unspecified abdominal pain: Secondary | ICD-10-CM

## 2015-08-26 DIAGNOSIS — M542 Cervicalgia: Secondary | ICD-10-CM | POA: Diagnosis not present

## 2015-08-26 DIAGNOSIS — M436 Torticollis: Secondary | ICD-10-CM | POA: Diagnosis present

## 2015-08-26 DIAGNOSIS — I152 Hypertension secondary to endocrine disorders: Secondary | ICD-10-CM | POA: Diagnosis present

## 2015-08-26 DIAGNOSIS — R197 Diarrhea, unspecified: Secondary | ICD-10-CM | POA: Diagnosis not present

## 2015-08-26 DIAGNOSIS — N393 Stress incontinence (female) (male): Secondary | ICD-10-CM | POA: Diagnosis present

## 2015-08-26 DIAGNOSIS — E1159 Type 2 diabetes mellitus with other circulatory complications: Secondary | ICD-10-CM | POA: Diagnosis present

## 2015-08-26 LAB — CBC WITH DIFFERENTIAL/PLATELET
BASOS ABS: 0 10*3/uL (ref 0.0–0.1)
BASOS PCT: 0 %
EOS ABS: 0 10*3/uL (ref 0.0–0.7)
EOS PCT: 0 %
HCT: 30.3 % — ABNORMAL LOW (ref 39.0–52.0)
Hemoglobin: 10.1 g/dL — ABNORMAL LOW (ref 13.0–17.0)
Lymphocytes Relative: 13 %
Lymphs Abs: 1.8 10*3/uL (ref 0.7–4.0)
MCH: 28.5 pg (ref 26.0–34.0)
MCHC: 33.3 g/dL (ref 30.0–36.0)
MCV: 85.6 fL (ref 78.0–100.0)
MONO ABS: 0.9 10*3/uL (ref 0.1–1.0)
Monocytes Relative: 6 %
Neutro Abs: 11.4 10*3/uL — ABNORMAL HIGH (ref 1.7–7.7)
Neutrophils Relative %: 81 %
PLATELETS: 242 10*3/uL (ref 150–400)
RBC: 3.54 MIL/uL — ABNORMAL LOW (ref 4.22–5.81)
RDW: 14.1 % (ref 11.5–15.5)
WBC: 14.1 10*3/uL — ABNORMAL HIGH (ref 4.0–10.5)

## 2015-08-26 LAB — COMPREHENSIVE METABOLIC PANEL
ALBUMIN: 3.3 g/dL — AB (ref 3.5–5.0)
ALK PHOS: 51 U/L (ref 38–126)
ALT: 57 U/L (ref 17–63)
AST: 81 U/L — AB (ref 15–41)
Anion gap: 12 (ref 5–15)
BILIRUBIN TOTAL: 1 mg/dL (ref 0.3–1.2)
BUN: 23 mg/dL — AB (ref 6–20)
CALCIUM: 8.5 mg/dL — AB (ref 8.9–10.3)
CO2: 22 mmol/L (ref 22–32)
Chloride: 100 mmol/L — ABNORMAL LOW (ref 101–111)
Creatinine, Ser: 1.03 mg/dL (ref 0.61–1.24)
GFR calc Af Amer: >60 mL/min (ref >60)
GLUCOSE: 110 mg/dL — AB (ref 65–99)
Potassium: 3.6 mmol/L (ref 3.5–5.1)
Sodium: 134 mmol/L — ABNORMAL LOW (ref 135–145)
TOTAL PROTEIN: 7.4 g/dL (ref 6.5–8.1)

## 2015-08-26 LAB — URINALYSIS, ROUTINE W REFLEX MICROSCOPIC
Bilirubin Urine: NEGATIVE
Glucose, UA: NEGATIVE mg/dL
Ketones, ur: 15 mg/dL — AB
LEUKOCYTES UA: NEGATIVE
Nitrite: NEGATIVE
Protein, ur: 100 mg/dL — AB
SPECIFIC GRAVITY, URINE: 1.019 (ref 1.005–1.030)
UROBILINOGEN UA: 1 mg/dL (ref 0.0–1.0)
pH: 5.5 (ref 5.0–8.0)

## 2015-08-26 LAB — LACTIC ACID, PLASMA: Lactic Acid, Venous: 0.9 mmol/L (ref 0.5–2.0)

## 2015-08-26 LAB — PROCALCITONIN: PROCALCITONIN: 0.54 ng/mL

## 2015-08-26 LAB — I-STAT CG4 LACTIC ACID, ED: LACTIC ACID, VENOUS: 1.24 mmol/L (ref 0.5–2.0)

## 2015-08-26 LAB — I-STAT TROPONIN, ED: Troponin i, poc: 0.08 ng/mL (ref 0.00–0.08)

## 2015-08-26 LAB — PROTIME-INR
INR: 1.37 (ref 0.00–1.49)
Prothrombin Time: 16.9 s — ABNORMAL HIGH (ref 11.6–15.2)

## 2015-08-26 LAB — APTT: aPTT: 32 seconds (ref 24–37)

## 2015-08-26 LAB — CBG MONITORING, ED
GLUCOSE-CAPILLARY: 101 mg/dL — AB (ref 65–99)
Glucose-Capillary: 98 mg/dL (ref 65–99)

## 2015-08-26 LAB — URINE MICROSCOPIC-ADD ON

## 2015-08-26 LAB — GLUCOSE, CAPILLARY: GLUCOSE-CAPILLARY: 105 mg/dL — AB (ref 65–99)

## 2015-08-26 MED ORDER — ACETAMINOPHEN 325 MG PO TABS
650.0000 mg | ORAL_TABLET | Freq: Once | ORAL | Status: AC | PRN
Start: 2015-08-26 — End: 2015-08-26
  Administered 2015-08-26: 650 mg via ORAL
  Filled 2015-08-26: qty 2

## 2015-08-26 MED ORDER — B COMPLEX-C PO TABS
1.0000 | ORAL_TABLET | Freq: Every day | ORAL | Status: DC
  Administered 2015-08-27 – 2015-08-31 (×5): 1 via ORAL
  Filled 2015-08-26 (×5): qty 1

## 2015-08-26 MED ORDER — DIFLUPREDNATE 0.05 % OP EMUL
1.0000 [drp] | Freq: Two times a day (BID) | OPHTHALMIC | Status: DC
  Administered 2015-08-26 – 2015-08-31 (×10): 1 [drp] via OPHTHALMIC

## 2015-08-26 MED ORDER — SODIUM CHLORIDE 0.9 % IV BOLUS (SEPSIS)
1000.0000 mL | Freq: Once | INTRAVENOUS | Status: AC
  Administered 2015-08-26: 1000 mL via INTRAVENOUS

## 2015-08-26 MED ORDER — HYDRALAZINE HCL 20 MG/ML IJ SOLN: 5.0000 mg | INTRAMUSCULAR | Status: DC | PRN

## 2015-08-26 MED ORDER — ACETAMINOPHEN 325 MG PO TABS: 650.0000 mg | ORAL_TABLET | Freq: Four times a day (QID) | ORAL | Status: DC | PRN

## 2015-08-26 MED ORDER — ALBUTEROL SULFATE (2.5 MG/3ML) 0.083% IN NEBU: 2.5000 mg | INHALATION_SOLUTION | RESPIRATORY_TRACT | Status: DC | PRN

## 2015-08-26 MED ORDER — PRAVASTATIN SODIUM 40 MG PO TABS
40.0000 mg | ORAL_TABLET | Freq: Every evening | ORAL | Status: DC
  Administered 2015-08-26 – 2015-08-30 (×5): 40 mg via ORAL
  Filled 2015-08-26 (×7): qty 1

## 2015-08-26 MED ORDER — INSULIN DETEMIR 100 UNIT/ML ~~LOC~~ SOLN
14.0000 [IU] | Freq: Every day | SUBCUTANEOUS | Status: DC
  Administered 2015-08-26 – 2015-08-30 (×5): 14 [IU] via SUBCUTANEOUS
  Filled 2015-08-26 (×5): qty 0.14

## 2015-08-26 MED ORDER — FAMOTIDINE IN NACL 20-0.9 MG/50ML-% IV SOLN
20.0000 mg | Freq: Two times a day (BID) | INTRAVENOUS | Status: DC
  Administered 2015-08-26 – 2015-08-29 (×6): 20 mg via INTRAVENOUS
  Filled 2015-08-26 (×6): qty 50

## 2015-08-26 MED ORDER — SODIUM CHLORIDE 0.9 % IV BOLUS (SEPSIS): 1000.0000 mL | INTRAVENOUS | Status: DC

## 2015-08-26 MED ORDER — METRONIDAZOLE IN NACL 5-0.79 MG/ML-% IV SOLN
500.0000 mg | Freq: Three times a day (TID) | INTRAVENOUS | Status: DC
  Administered 2015-08-26: 500 mg via INTRAVENOUS
  Filled 2015-08-26 (×2): qty 100

## 2015-08-26 MED ORDER — ASPIRIN 81 MG PO CHEW
81.0000 mg | CHEWABLE_TABLET | Freq: Every day | ORAL | Status: DC
  Administered 2015-08-27 – 2015-08-31 (×5): 81 mg via ORAL
  Filled 2015-08-26 (×5): qty 1

## 2015-08-26 MED ORDER — CENTRUM SILVER PO TABS: 1.0000 | ORAL_TABLET | Freq: Every day | ORAL | Status: DC

## 2015-08-26 MED ORDER — VANCOMYCIN HCL IN DEXTROSE 1-5 GM/200ML-% IV SOLN
1000.0000 mg | Freq: Two times a day (BID) | INTRAVENOUS | Status: DC
  Administered 2015-08-26 – 2015-08-29 (×5): 1000 mg via INTRAVENOUS
  Filled 2015-08-26 (×6): qty 200

## 2015-08-26 MED ORDER — PIPERACILLIN-TAZOBACTAM 3.375 G IVPB
3.3750 g | Freq: Three times a day (TID) | INTRAVENOUS | Status: DC
  Administered 2015-08-27 – 2015-08-29 (×7): 3.375 g via INTRAVENOUS
  Filled 2015-08-26 (×8): qty 50

## 2015-08-26 MED ORDER — DM-GUAIFENESIN ER 30-600 MG PO TB12
1.0000 | ORAL_TABLET | Freq: Two times a day (BID) | ORAL | Status: DC
  Administered 2015-08-26 – 2015-08-31 (×10): 1 via ORAL
  Filled 2015-08-26 (×12): qty 1

## 2015-08-26 MED ORDER — IOHEXOL 300 MG/ML  SOLN
100.0000 mL | Freq: Once | INTRAMUSCULAR | Status: AC | PRN
  Administered 2015-08-26: 100 mL via INTRAVENOUS

## 2015-08-26 MED ORDER — NAPROXEN SODIUM 275 MG PO TABS
275.0000 mg | ORAL_TABLET | Freq: Two times a day (BID) | ORAL | Status: DC
  Administered 2015-08-27 – 2015-08-31 (×9): 275 mg via ORAL
  Filled 2015-08-26 (×12): qty 1

## 2015-08-26 MED ORDER — METRONIDAZOLE IN NACL 5-0.79 MG/ML-% IV SOLN
500.0000 mg | Freq: Three times a day (TID) | INTRAVENOUS | Status: DC
  Administered 2015-08-26 – 2015-08-27 (×3): 500 mg via INTRAVENOUS
  Filled 2015-08-26 (×5): qty 100

## 2015-08-26 MED ORDER — SODIUM CHLORIDE 0.9 % IV SOLN
INTRAVENOUS | Status: DC
  Administered 2015-08-26 – 2015-08-28 (×2): via INTRAVENOUS

## 2015-08-26 MED ORDER — ACETAMINOPHEN 650 MG RE SUPP: 650.0000 mg | Freq: Four times a day (QID) | RECTAL | Status: DC | PRN

## 2015-08-26 MED ORDER — SODIUM CHLORIDE 0.9 % IJ SOLN
3.0000 mL | Freq: Two times a day (BID) | INTRAMUSCULAR | Status: DC
  Administered 2015-08-28 – 2015-08-31 (×3): 3 mL via INTRAVENOUS

## 2015-08-26 MED ORDER — CHOLECALCIFEROL 10 MCG (400 UNIT) PO TABS
400.0000 [IU] | ORAL_TABLET | Freq: Every day | ORAL | Status: DC
  Administered 2015-08-27 – 2015-08-30 (×4): 400 [IU] via ORAL
  Filled 2015-08-26 (×7): qty 1

## 2015-08-26 MED ORDER — B COMPLEX PO TABS: 1.0000 | ORAL_TABLET | Freq: Every day | ORAL | Status: DC

## 2015-08-26 MED ORDER — ENOXAPARIN SODIUM 40 MG/0.4ML ~~LOC~~ SOLN
40.0000 mg | SUBCUTANEOUS | Status: DC
  Administered 2015-08-26 – 2015-08-30 (×5): 40 mg via SUBCUTANEOUS
  Filled 2015-08-26 (×6): qty 0.4

## 2015-08-26 MED ORDER — GATIFLOXACIN 0.5 % OP SOLN
1.0000 [drp] | Freq: Four times a day (QID) | OPHTHALMIC | Status: DC
  Administered 2015-08-26 – 2015-08-31 (×17): 1 [drp] via OPHTHALMIC
  Filled 2015-08-26: qty 2.5

## 2015-08-26 MED ORDER — PIPERACILLIN-TAZOBACTAM 3.375 G IVPB 30 MIN
3.3750 g | Freq: Once | INTRAVENOUS | Status: AC
  Administered 2015-08-26: 3.375 g via INTRAVENOUS
  Filled 2015-08-26: qty 50

## 2015-08-26 MED ORDER — DOXAZOSIN MESYLATE 4 MG PO TABS
4.0000 mg | ORAL_TABLET | Freq: Every day | ORAL | Status: DC
  Administered 2015-08-27 – 2015-08-31 (×5): 4 mg via ORAL
  Filled 2015-08-26 (×6): qty 1

## 2015-08-26 MED ORDER — INSULIN ASPART 100 UNIT/ML ~~LOC~~ SOLN
0.0000 [IU] | Freq: Three times a day (TID) | SUBCUTANEOUS | Status: DC
  Administered 2015-08-27: 2 [IU] via SUBCUTANEOUS
  Administered 2015-08-27: 3 [IU] via SUBCUTANEOUS
  Administered 2015-08-28: 2 [IU] via SUBCUTANEOUS
  Administered 2015-08-28: 3 [IU] via SUBCUTANEOUS
  Administered 2015-08-28 – 2015-08-29 (×2): 2 [IU] via SUBCUTANEOUS
  Administered 2015-08-29: 1 [IU] via SUBCUTANEOUS
  Administered 2015-08-29 – 2015-08-30 (×2): 2 [IU] via SUBCUTANEOUS
  Administered 2015-08-30: 1 [IU] via SUBCUTANEOUS
  Administered 2015-08-30: 3 [IU] via SUBCUTANEOUS
  Administered 2015-08-31: 1 [IU] via SUBCUTANEOUS

## 2015-08-26 MED ORDER — MORPHINE SULFATE (PF) 2 MG/ML IV SOLN: 1.0000 mg | INTRAVENOUS | Status: DC | PRN

## 2015-08-26 MED ORDER — KETOROLAC TROMETHAMINE 0.5 % OP SOLN
1.0000 [drp] | Freq: Four times a day (QID) | OPHTHALMIC | Status: DC
  Administered 2015-08-26 – 2015-08-31 (×17): 1 [drp] via OPHTHALMIC
  Filled 2015-08-26: qty 3

## 2015-08-26 MED ORDER — ONDANSETRON HCL 4 MG/2ML IJ SOLN: 4.0000 mg | Freq: Three times a day (TID) | INTRAMUSCULAR | Status: DC | PRN

## 2015-08-26 MED ORDER — ADULT MULTIVITAMIN W/MINERALS CH
1.0000 | ORAL_TABLET | Freq: Every day | ORAL | Status: DC
  Administered 2015-08-27 – 2015-08-30 (×4): 1 via ORAL
  Filled 2015-08-26 (×5): qty 1

## 2015-08-26 NOTE — ED Notes (Signed)
Patient states he has been feeling weak x 1 week. Patient states he had diarrhea 2 days ago, but resolved after taking Imodium

## 2015-08-26 NOTE — H&P (Addendum)
[facility] History and Physical  [name] VQM:[id number] DOB: [birth year] DOA: 08/26/2015  Referring physician: ED physician PCP: [name]., DO  Specialists:   Chief Complaint: Generalized weakness, fever, abdominal pain, diarrhea.  HPI: [name] is a 81 y.o. male with PMH of hypertension, hyperlipidemia, diabetes mellitus, GERD, diverticulosis, arthritis, DVT, IBS, diastolic congestive heart failure (EF 60-65% with grade 1 diastolic distortion), CAD, peripheral neuropathy, chronic neck pain, chronic knee pain, sleeping a lot at at normal baseline, who presents with generalized weakness, fever, abdominal pain, diarrhea.  Patient reports that in the past 7 days, he has been feeling very weak. He has fever or chills. He had one episode of watery diarrhea 2 days ago, which has resolved after he started taking Imodium at home. He has mild abdominal pain in periumbilical area, which is constant and nonradiating. He has mild nausea, but no vomiting. Currently no diarrhea. He has mild dry cough, but no chest pain or SOB now. Has pain all over, including at both knee joints, and posterior neck. He reports that he has chronic bilateral knee pain, which has worsened in the past several days, but no knee swelling. Has chronic posterior neck pain and neck stiffness. The nek pain is slightly worse than his baseline, but no worsening neck stiffness. He can put his chin to his chest without any problem. He has mild headache. No photophobia. Per patient's wife, he has been sleeping a lot in the past several month, which is normal to him. Patient does not have symptoms of UTI, rashes, unilateral weakness. He reports that he had a cataract surgery on her right eye on 08/16/15. Per his ophthalmologist, he has been recovering from surgery well. He is taking eyedrops.  In ED, patient was found to have WBC 14.3, fever with temperature 103.3, lactate 1.24, troponin negative, negative urinalysis for UTI,  electrolytes okay, renal function okay, negative chest x-ray for acute abnormalities. Patient's admitted to inpatient for further evaluation and treatment.   Where does patient live?   At home  Can patient participate in ADLs?  Some   Review of Systems:   General: has fevers, chills, no changes in body weight, has poor appetite, has fatigue HEENT: no blurry vision, hearing changes or sore throat Pulm: no dyspnea, has mild dry coughing, no wheezing CV: no chest pain, palpitations Abd: has nausea, abdominal pain, diarrhea, No vomiting or constipation GU: no dysuria, burning on urination, increased urinary frequency, hematuria  Ext: Minimal leg edema Neuro: no unilateral weakness, numbness, or tingling, no vision change or hearing loss Skin: no rash MSK: No muscle spasm, no deformity, no limitation of range of movement in spin Heme: No easy bruising.  Travel history: No recent long distant travel.  Allergy:  Allergies  Allergen Reactions  . Ciprofloxacin Other (See Comments)    "made me have a weird feeling. Disoriented."   . Sulfamethoxazole Rash    Past Medical History  Diagnosis Date  . Diabetes mellitus     type 2  . Diverticulosis of colon   . GERD (gastroesophageal reflux disease)   . Osteoarthritis   . Stress incontinence, male     urge  . Dyslipidemia   . Hernia, hiatal   . Hyperlipidemia   . Hypertension   . DVT (deep venous thrombosis)   . Overactive bladder     Hx: of  . IBS (irritable bowel syndrome)     Hx: of  . Pneumonia 2002    history of  .  Varicose veins of lower extremities with other complications 9/62/9528  . Atherosclerosis of native arteries of the extremities with intermittent claudication 02/10/2013  . DIVERTICULOSIS, COLON 06/13/2007    Qualifier: Diagnosis of  By: Leanne Chang MD, Bruce    . DEGENERATIVE JOINT DISEASE, GENERALIZED 01/24/2010    Annotation: diffuse pains of joints Qualifier: Diagnosis of  By: Leanne Chang MD, Bruce    . Shingles 09/11/2013   . Chronic diastolic congestive heart failure 11/23/2014  . CAD (coronary artery disease) 10/14/2014  . PERIPHERAL NEUROPATHY 05/30/2010    Annotation: likely DM related Qualifier: Diagnosis of  By: Leanne Chang MD, Bruce    . Edema 08/19/2014    Past Surgical History  Procedure Laterality Date  . Wrist tendon surgery    . Cts bilateral    . Knee arthroscopy  1988, 1992    bilateral  . Total knee arthroplasty  2005, 2007  . Lumbar left arm  1961  . Tonsillectomy    . Back surgery    . Colonoscopy      Hx: of  . Laminectomy  08/18/2013    DECOMPRESSIVE LAMINECTOMIES  . Lumbar laminectomy/decompression microdiscectomy N/A 08/18/2013    Procedure: Lumbar two-three, lumbar three-four, lumbar four-five decompressive laminectomies;  Surgeon: [name], MD;  Location: [facility];  Service: Neurosurgery;  Laterality: N/A;  . Left and right heart catheterization with coronary angiogram N/A 09/03/2014    Procedure: LEFT AND RIGHT HEART CATHETERIZATION WITH CORONARY ANGIOGRAM;  Surgeon: [name], MD;  Location: [facility] C[facility];  Service: Cardiovascular;  Laterality: N/A;  . Cataract extraction Bilateral 08/10/2015,08/15/2015    Social History:  reports that he has never smoked. He has never used smokeless tobacco. He reports that he does not drink alcohol or use illicit drugs.  Family History:  Family History  Problem Relation Age of Onset  . Other Mother 53    CABG  . Heart disease Mother   . Hypertension Mother   . Hyperlipidemia Son      Prior to Admission medications   Medication Sig Start Date End Date Taking? Authorizing Provider  amLODipine (NORVASC) 5 MG tablet Take 5 mg by mouth daily.    Yes Historical Provider, MD  aspirin 81 MG tablet Take 81 mg by mouth daily.    Yes Historical Provider, MD  b complex vitamins tablet Take 1 tablet by mouth daily.   Yes Historical Provider, MD  BESIVANCE 0.6 % SUSP Place 1 drop into the right eye 3 (three) times daily. Stopping 08-31-15  08/11/15  Yes Historical Provider, MD  Cholecalciferol (VITAMIN D PO) Take 1 capsule by mouth daily.   Yes Historical Provider, MD  doxazosin (CARDURA) 4 MG tablet TAKE ONE TABLET BY MOUTH DAILY 04/09/15  Yes [name], DO  DUREZOL 0.05 % EMUL Apply 1 drop to eye. BID for 1 week (9-13 to 9-20), QD for 1 week (9-20 to 9-27), then stop - RIGHT EYE QD for 1 week in LEFT EYE 9-14 to 09-01-15 08/18/15  Yes Historical Provider, MD  furosemide (LASIX) 20 MG tablet Take 2 tablets (40 mg total) by mouth daily as needed. Patient taking differently: Take 20 mg by mouth every other day.  11/12/14  Yes Lelon Perla, MD  insulin aspart (NOVOLOG FLEXPEN) 100 UNIT/ML FlexPen Inject 8-12 Units into the skin 3 (three) times daily with meals. Patient taking differently: Inject 8-12 Units into the skin 3 (three) times daily with meals. Sliding scale 04/13/15  Yes Philemon Kingdom, MD  Insulin Detemir (LEVEMIR FLEXTOUCH) 100 UNIT/ML Pen INJECT 20 UNITS INTO THE SKIN DAILY AT 10 PM. 04/13/15  Yes Philemon Kingdom, MD  metFORMIN (GLUCOPHAGE) 1000 MG tablet Take 1 tablet (1,000 mg total) by mouth 2 (two) times daily with a meal. 04/13/15  Yes Philemon Kingdom, MD  Multiple Vitamins-Minerals (CENTRUM SILVER PO) Take 1 tablet by mouth daily.    Yes Historical Provider, MD  naproxen sodium (ANAPROX) 220 MG tablet Take 220 mg by mouth 2 (two) times daily with a meal.   Yes Historical Provider, MD  omeprazole (PRILOSEC) 20 MG capsule TAKE 1 CAPSULE (20 MG TOTAL) BY MOUTH DAILY. 04/21/15  Yes [name], DO  pravastatin (PRAVACHOL) 40 MG tablet Take 1 tablet (40 mg total) by mouth every evening. 10/14/14 10/14/15 Yes Lelon Perla, MD  PROLENSA 0.07 % SOLN Place 1 drop into both eyes every evening. Stop in left eye 09-07-15 right eye on 09-14-15 08/11/15  Yes Historical Provider, MD    Physical Exam: Filed Vitals:   08/26/15 2030 08/26/15 2032 08/26/15 2036 08/26/15 2133  BP: 113/58  113/58 121/58  Pulse: 60 70 78 76  Temp:    98.9 F (37.2 C) 98.8 F (37.1 C)  TempSrc:   Rectal Oral  Resp: 18 17 19 18   Height:    5\' 10"  (1.778 m)  Weight:    87.8 kg (193 lb 9 oz)  SpO2: 95% 94% 95% 96%   General: Not in acute distress HEENT:       Eyes: PERRL, EOMI, no scleral icterus.       ENT: No discharge from the ears and nose, no pharynx injection, no tonsillar enlargement.        Neck: No JVD, no bruit, no mass felt. Heme: No neck lymph node enlargement. Cardiac: S1/S2, RRR, No murmurs, No gallops or rubs. Pulm:  No rales, wheezing, rhonchi or rubs. Abd: Soft, nondistended, mild tenderness over periumbilical area, no rebound pain, no organomegaly, BS present. Ext: Trace leg edema bilaterally. 2+DP/PT pulse bilaterally. Musculoskeletal: No joint deformities, No joint redness or warmth, no limitation of ROM in spin. Skin: No rashes.  Neuro: Alert, drowsy and sleepy, but oriented X3, cranial nerves II-XII grossly intact, muscle strength 5/5 in all extremities, sensation to light touch intact. Brachial reflex q+ bilaterally. Knee reflex 1+ bilaterally. Negative Babinski's sign. Normal finger to nose test. No neck stiffness or meningeal signs.  Psych: Patient is not psychotic, no suicidal or hemocidal ideation.  Labs on Admission:  Basic Metabolic Panel:  Recent Labs Lab 08/26/15 1636  NA 134*  K 3.6  CL 100*  CO2 22  GLUCOSE 110*  BUN 23*  CREATININE 1.03  CALCIUM 8.5*   Liver Function Tests:  Recent Labs Lab 08/26/15 1636  AST 81*  ALT 57  ALKPHOS 51  BILITOT 1.0  PROT 7.4  ALBUMIN 3.3*   No results for input(s): LIPASE, AMYLASE in the last 168 hours. No results for input(s): AMMONIA in the last 168 hours. CBC:  Recent Labs Lab 08/26/15 1636  WBC 14.1*  NEUTROABS 11.4*  HGB 10.1*  HCT 30.3*  MCV 85.6  PLT 242   Cardiac Enzymes: No results for input(s): CKTOTAL, CKMB, CKMBINDEX, TROPONINI in the last 168 hours.  BNP (last 3 results) No results for input(s): BNP in the last 8760  hours.  ProBNP (last 3 results) No results for input(s): PROBNP in the last 8760 hours.  CBG:  Recent Labs Lab 08/26/15 1625 08/26/15 1956 08/26/15 2141  GLUCAP 101* 98 105*    Radiological Exams on Admission: Dg Chest 2 View  08/26/2015   CLINICAL DATA:  Chest pain radiating to the left arm.  EXAM: CHEST  2 VIEW  COMPARISON:  08/13/2013  FINDINGS: Cardiomediastinal silhouette is enlarged. Mediastinal contours appear intact.  There is no evidence of focal airspace consolidation, pleural effusion or pneumothorax.  Osseous structures are without acute abnormality. The changes of thoracic spine diffuse idiopathic skeletal hyperostosis are seen. Soft tissues are grossly normal.  IMPRESSION: Enlarged cardiac silhouette, otherwise no radiographic evidence of acute cardiopulmonary abnormality.   Electronically Signed   By: Fidela Salisbury M.D.   On: 08/26/2015 17:43   Ct Abdomen Pelvis W Contrast  08/26/2015   CLINICAL DATA:  81 year old male with abdominal pain, weakness, and diarrhea.  EXAM: CT ABDOMEN AND PELVIS WITH CONTRAST  TECHNIQUE: Multidetector CT imaging of the abdomen and pelvis was performed using the standard protocol following bolus administration of intravenous contrast.  CONTRAST:  159mL OMNIPAQUE IOHEXOL 300 MG/ML  SOLN  COMPARISON:  Chest radiograph dated 08/26/2015, lumbar spine radiograph dated 08/05/2015.  FINDINGS: There is a patchy area of airspace opacity at the left lung base most compatible with pneumonia. Clinical correlation and follow-up resolution recommended. There is coronary vascular calcification. No intra-abdominal free air or free fluid identified.  Subcentimeter left hepatic hypodense lesion, too small to characterize. Gallstone. No pericholecystic fluid. The pancreas, spleen, adrenal glands appear unremarkable. There is a 6.3 x 4.8 cm cyst with partially calcified wall in the inferior pole of the right kidney. Ultrasound may provide better evaluation. Small  left renal hypodense lesions are not well characterized but likely represent cysts. There is no hydronephrosis on either side. The visualized ureters and urinary bladder appear unremarkable. The prostate and seminal vesicles are grossly unremarkable.  There is extensive colonic diverticulosis without active inflammation. There is apparent segmental thickening of the sigmoid colon without associated inflammatory changes, likely related to underdistention. Colitis is less likely but not excluded. Clinical correlation is recommended. Moderate stool noted throughout the colon. No evidence of bowel obstruction. The appendix is not visualized with certainty. No inflammatory changes identified in the right lower quadrant. Small hiatal hernia.  There is aortoiliac atherosclerotic disease. The abdominal aorta is tortuous. No portal venous gas identified. There is no lymphadenopathy.  Small fat containing umbilical hernia. Degenerative changes of the spine. Lower lumbar laminectomy. No acute fracture.  IMPRESSION: Left lower lobe pneumonia. Clinical correlation and follow-up resolution recommended.  Diverticulosis.  Underdistention versus less likely segmental colitis of the sigmoid colon. Clinical correlation recommended.  Cholelithiasis.  Right renal cyst.   Electronically Signed   By: Anner Crete M.D.   On: 08/26/2015 21:40     EKG: Independently reviewed. QTC 449, PVC, LAD Assessment/Plan Principal Problem:   Sepsis Active Problems:   Uncontrolled type 2 diabetes mellitus with peripheral circulatory disorder   Hyperlipemia   Diabetic peripheral neuropathy associated with type 2 diabetes mellitus   Essential hypertension   GERD   CAD (coronary artery disease)   Chronic diastolic congestive heart failure   OAB (overactive bladder)   Diarrhea   Cough   Abdominal pain   Generalized weakness   CAP (community acquired pneumonia)  Addendum: CT-abd/pelvis showed: Left lower lobe pneumonia,  underdistention versus less likely segmental colitis of the sigmoid colon.   -Will treat pt as CAP.  -patient is started with IV Zosyn, Flagyl for possible colitis, will add IV vancomycin  - Mucinex for cough  -  albuterol prn for SOB - Urine legionella and S. pneumococcal antigen - Follow up blood culture x2, sputum culture and  plus Flu pcr  Sepsis: Patient is septic with the leukocytosis and fever on admission. Hemodynamically stable. The etiology is not clear. Given patient's mild abdominal pain and one episode of diarrhea, intra-abdominal infection is the possible etiology, such as colitis, diverticulitis, C. difficile colitis and and viral enteritis.   -will admit to tele bed -start IV Flagyl and Zosyn empirically -CT-abd/pelvis -will get Procalcitonin and trend lactic acid levels per sepsis protocol. -IVF: 1 of NS bolus in ED, followed by 75 c/h (patient has a congestive heart failure, limiting aggressive IV fluids treatment). -prn Zofran for nausea, morphine for pain -NPO -will hold imodium. -check GI path panel and stool culture. -will check c diff pcr if patient develops diarrhea (not ordered yet). -follow up Bx and Ux. -check lipase  DM-II: Last A1c 7.7 on 03/16/15, not well controled. Patient is taking metformin and Levemir at home -will decrease Lantus dose from 20 units to 40 units daily -SSI  HLD: Last LDL was 43 on 03/26/15 -Continue home medications: Pravastatin  HTN: -hold home amlodipine and lasix since patient is at risk of developing hypotension -When necessary hydralazine IV -Patient is on Cardura which is for stress incontinence.   GERD: -will switch PPI to pepcid IV until C diff pcr negative  CAD (coronary artery disease): no chest pain. -continue ASA  OAB (overactive bladder): -Continue Cardura -Will d/c this med, if patient's bp drops.  Chronic diastolic congestive heart failure: 2-D echo on 08/26/14 showed EF 60-65% with grade 1 diastolic  dysfunction. Patient is on Lasix 40 mg prn at home. Patient has trace amount of leg edema bilaterally. CHF is compensated on admission. -Hold Lasix since patient in IV fluid to sepsis -ASA -Check BNP  Cough: Mild dry cough. CXR negative. No chest pain or shortness of breath. -When necessary Mucinex    DVT ppx: SQ Lovenox  Code Status: Full code Family Communication:  Yes, patient's wife  at bed side Disposition Plan: Admit to inpatient   Date of Service 08/26/2015    Ivor Costa [facility] Pager [phone]  If 7PM-7AM, please contact night-coverage [email or web address] Password [email or web address] 08/26/2015, 10:02 PM

## 2015-08-26 NOTE — Progress Notes (Signed)
RN notified admissions to see which MD was assigned to patient.  Awaiting a call back from Admissions on Amion.

## 2015-08-26 NOTE — Progress Notes (Signed)
Pre visit review using our clinic review tool, if applicable. No additional management support is needed unless otherwise documented below in the visit note. 

## 2015-08-26 NOTE — Progress Notes (Signed)
ANTIBIOTIC CONSULT NOTE - INITIAL  Pharmacy Consult for vancomycin Indication: pneumonia  Allergies  Allergen Reactions  . Ciprofloxacin Other (See Comments)    "made me have a weird feeling. Disoriented."   . Sulfamethoxazole Rash    Patient Measurements: Height: 5\' 10"  (177.8 cm) Weight: 193 lb 9 oz (87.8 kg) IBW/kg (Calculated) : 73  Vital Signs: Temp: 98.8 F (37.1 C) (09/15 2133) Temp Source: Oral (09/15 2133) BP: 121/58 mmHg (09/15 2133) Pulse Rate: 76 (09/15 2133) Intake/Output from previous day:   Intake/Output from this shift:    Labs:  Recent Labs  08/26/15 1636  WBC 14.1*  HGB 10.1*  PLT 242  CREATININE 1.03   Estimated Creatinine Clearance: 64.9 mL/min (by C-G formula based on Cr of 1.03). No results for input(s): VANCOTROUGH, VANCOPEAK, VANCORANDOM, GENTTROUGH, GENTPEAK, GENTRANDOM, TOBRATROUGH, TOBRAPEAK, TOBRARND, AMIKACINPEAK, AMIKACINTROU, AMIKACIN in the last 72 hours.   Microbiology: No results found for this or any previous visit (from the past 720 hour(s)).  Medical History: Past Medical History  Diagnosis Date  . Diabetes mellitus     type 2  . Diverticulosis of colon   . GERD (gastroesophageal reflux disease)   . Osteoarthritis   . Stress incontinence, male     urge  . Dyslipidemia   . Hernia, hiatal   . Hyperlipidemia   . Hypertension   . DVT (deep venous thrombosis)   . Overactive bladder     Hx: of  . IBS (irritable bowel syndrome)     Hx: of  . Pneumonia 2002    history of  . Varicose veins of lower extremities with other complications 6/54/6503  . Atherosclerosis of native arteries of the extremities with intermittent claudication 02/10/2013  . DIVERTICULOSIS, COLON 06/13/2007    Qualifier: Diagnosis of  By: Leanne Chang MD, Bruce    . DEGENERATIVE JOINT DISEASE, GENERALIZED 01/24/2010    Annotation: diffuse pains of joints Qualifier: Diagnosis of  By: Leanne Chang MD, Bruce    . Shingles 09/11/2013  . Chronic diastolic congestive  heart failure 11/23/2014  . CAD (coronary artery disease) 10/14/2014  . PERIPHERAL NEUROPATHY 05/30/2010    Annotation: likely DM related Qualifier: Diagnosis of  By: Leanne Chang MD, Bruce    . Edema 08/19/2014    Medications:  Scheduled:  . [START ON 08/27/2015] aspirin  81 mg Oral Daily  . [START ON 08/27/2015] B-complex with vitamin C  1 tablet Oral Daily  . [START ON 08/27/2015] cholecalciferol  400 Units Oral Daily  . dextromethorphan-guaiFENesin  1 tablet Oral BID  . Difluprednate  1 drop Ophthalmic BID  . [START ON 08/27/2015] doxazosin  4 mg Oral Daily  . enoxaparin (LOVENOX) injection  40 mg Subcutaneous Q24H  . famotidine (PEPCID) IV  20 mg Intravenous Q12H  . gatifloxacin  1 drop Both Eyes QID  . [START ON 08/27/2015] insulin aspart  0-9 Units Subcutaneous TID WC  . insulin detemir  14 Units Subcutaneous Q2200  . ketorolac  1 drop Both Eyes QID  . metronidazole  500 mg Intravenous Q8H  . [START ON 08/27/2015] multivitamin with minerals  1 tablet Oral Daily  . [START ON 08/27/2015] naproxen sodium  275 mg Oral BID WC  . [START ON 08/27/2015] piperacillin-tazobactam (ZOSYN)  IV  3.375 g Intravenous Q8H  . pravastatin  40 mg Oral QPM  . sodium chloride  3 mL Intravenous Q12H   Infusions:  . sodium chloride 75 mL/hr at 08/26/15 2028   Assessment: 81 yo male presented to ER with CC  weakness, fever, abd pain, diarrhea. To start vancomycin IV per pharmacy to be added to Zosyn and Flagyl for possible colitis.   Goal of Therapy:  Vancomycin trough level 15-20 mcg/ml  Plan:  Vancomycin 1g IV q12   Adrian Saran, PharmD, BCPS Pager [phone] 08/26/2015 10:07 PM

## 2015-08-26 NOTE — Progress Notes (Signed)
HPI:  [name] is a pleasant 81 yo M with a complicated PMH of ouncontrolled DM, HTN, HLD, DVT, CAD, PVD, DDD here for an acute visit for:  Fevers/weakness: -reports started feeling bad about 1 week ago and progressively has worsened -symptoms: severe neck pain (wife reports brings him to tears even with small movements)  - progressively worse now radiating to L arm with numbness of L arm, bilat lower extremity weakness with several falls this week, severe chills and rigors, fevers, diarrhea for one day several days ago, headaches and intermittent chest pain -reports had cataract surgery about 1 week ago -had eval with his [facility] 3 weeks ago for chronic DDD but reports this is all new and much worse -wife reports she found him face down outside today and he could not get up, it took them 30 minutes to get him up and bring him here -denies: sore throat, cough, joint swelling, rash, sick contacts, tick bites, urinary symptoms, vomiting  ROS: See pertinent positives and negatives per HPI.  Past Medical History  Diagnosis Date  . Diabetes mellitus     type 2  . Diverticulosis of colon   . GERD (gastroesophageal reflux disease)   . Osteoarthritis   . Stress incontinence, male     urge  . Dyslipidemia   . Hernia, hiatal   . Hyperlipidemia   . Hypertension   . DVT (deep venous thrombosis)   . Overactive bladder     Hx: of  . IBS (irritable bowel syndrome)     Hx: of  . Pneumonia 2002    history of  . Varicose veins of lower extremities with other complications 0/25/8527  . Atherosclerosis of native arteries of the extremities with intermittent claudication 02/10/2013  . DIVERTICULOSIS, COLON 06/13/2007    Qualifier: Diagnosis of  By: Leanne Chang MD, Bruce    . DEGENERATIVE JOINT DISEASE, GENERALIZED 01/24/2010    Annotation: diffuse pains of joints Qualifier: Diagnosis of  By: Leanne Chang MD, Bruce    . Shingles 09/11/2013  . Chronic diastolic congestive heart failure 11/23/2014  . CAD  (coronary artery disease) 10/14/2014  . PERIPHERAL NEUROPATHY 05/30/2010    Annotation: likely DM related Qualifier: Diagnosis of  By: Leanne Chang MD, Bruce    . Edema 08/19/2014    Past Surgical History  Procedure Laterality Date  . Wrist tendon surgery    . Cts bilateral    . Knee arthroscopy  1988, 1992    bilateral  . Total knee arthroplasty  2005, 2007  . Lumbar left arm  1961  . Tonsillectomy    . Back surgery    . Colonoscopy      Hx: of  . Laminectomy  08/18/2013    DECOMPRESSIVE LAMINECTOMIES  . Lumbar laminectomy/decompression microdiscectomy N/A 08/18/2013    Procedure: Lumbar two-three, lumbar three-four, lumbar four-five decompressive laminectomies;  Surgeon: [name], MD;  Location: [facility];  Service: Neurosurgery;  Laterality: N/A;  . Left and right heart catheterization with coronary angiogram N/A 09/03/2014    Procedure: LEFT AND RIGHT HEART CATHETERIZATION WITH CORONARY ANGIOGRAM;  Surgeon: [name], MD;  Location: [facility] CATH LAB;  Service: Cardiovascular;  Laterality: N/A;  . Cataract extraction Bilateral 08/10/2015,08/15/2015    Family History  Problem Relation Age of Onset  . Other Mother 71    CABG  . Heart disease Mother   . Hypertension Mother   . Hyperlipidemia Son     Social History   Social History  . Marital  Status: Married    Spouse Name: N/A  . Number of Children: 2  . Years of Education: N/A   Social History Main Topics  . Smoking status: Never Smoker   . Smokeless tobacco: Never Used  . Alcohol Use: No  . Drug Use: No  . Sexual Activity: Not Asked   Other Topics Concern  . 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  amLODipine (NORVASC) 5 MG tablet, Take 2.5 mg by mouth daily., Disp: , Rfl:  .  aspirin 81 MG tablet, Take 81 mg by mouth daily. , Disp: , Rfl:  .  B-D UF III MINI PEN NEEDLES 31G X 5 MM MISC, USE 4X A DAY AS DIRECTED, Disp: 200 each, Rfl: 5 .  doxazosin (CARDURA) 4 MG tablet, Take 2 mg by mouth daily., Disp: , Rfl:  .  doxazosin (CARDURA) 4 MG tablet, TAKE ONE TABLET BY MOUTH DAILY, Disp: 90 tablet, Rfl: 2 .  furosemide (LASIX) 20 MG tablet, Take 2 tablets (40 mg total) by mouth daily as needed., Disp: 60 tablet, Rfl: 11 .  glucose blood (ONETOUCH VERIO) test strip, Use to test blood sugar 3 times daily as instructed. Dx: E11.59, Disp: 100 each, Rfl: 5 .  insulin aspart (NOVOLOG FLEXPEN) 100 UNIT/ML FlexPen, Inject 8-12 Units into the skin 3 (three) times daily with meals., Disp: 15 mL, Rfl: 2 .  Insulin Detemir (LEVEMIR FLEXTOUCH) 100 UNIT/ML Pen, INJECT 20 UNITS INTO THE SKIN DAILY AT 10 PM., Disp: 15 mL, Rfl: 2 .  metFORMIN (GLUCOPHAGE) 1000 MG tablet, Take 1 tablet (1,000 mg total) by mouth 2 (two) times daily with a meal., Disp: 180 tablet, Rfl: 3 .  Multiple Vitamins-Minerals (CENTRUM SILVER PO), Take 1 tablet by mouth daily. , Disp: , Rfl:  .  omeprazole (PRILOSEC) 20 MG capsule, TAKE 1 CAPSULE (20 MG TOTAL) BY MOUTH DAILY., Disp: 90 capsule, Rfl: 0 .  ONETOUCH DELICA LANCETS 64P MISC, Use to test blood sugar 3 times daily as instructed. Dx code: E11.59, Disp: 300 each, Rfl: 3 .  pravastatin (PRAVACHOL) 40 MG tablet, Take 1 tablet (40 mg total) by mouth every evening., Disp: 30 tablet, Rfl: 11 .  Propylene Glycol (SYSTANE BALANCE OP), Place 2 drops into both eyes daily as needed (for eyes)., Disp: , Rfl:   EXAM:  Filed Vitals:   08/26/15 1423  BP: 118/58  Pulse: 83  Temp: 100.1 F (37.8 C)    Body mass index is 28.81 kg/(m^2).  GENERAL: vitals reviewed and listed above, pale, febrile, weak, walking very slow with a walker  HEENT: atraumatic, no  obvious abnormalities on inspection of external nose and ears  NECK: no obvious masses on inspection, neck stiffness and decreased ROM due to pain  LUNGS: clear to auscultation bilaterally, no wheezes, rales or rhonchi, good air movement  CV: HRRR, no peripheral edema  MS: walks slow with the walker  PSYCH: pleasant and cooperative, no obvious depression or anxiety  ASSESSMENT AND PLAN:  Discussed the following assessment and plan:  Fever, unspecified fever  cause  Neck pain  Weakness  -very concerning symptoms, unsure of etiology and while may viral ilnesss complicated by his many chronic medical conditions, given severity of symptoms feel warrants eval in emergency department for imaging neck, labs, urine, cardiac eval, CXR, possible LP and imaging brain if no other cause of fever and symptoms found -wife agreed and they declined EMS transport but agreed to take him to [facility] -advised assistant to notify ED -Patient advised to return or notify a doctor immediately if symptoms worsen or persist or new concerns arise.  There are no Patient Instructions on file for this visit.   Colin Benton R.

## 2015-08-26 NOTE — Progress Notes (Signed)
Utilization Review completed.  Tyarra Nolton RN CM  

## 2015-08-26 NOTE — ED Provider Notes (Signed)
CSN: [id number]     Arrival date & time 08/26/15  1553 History   First MD Initiated Contact with Patient 08/26/15 1617     Chief Complaint  Patient presents with  . Weakness    HPI   [name] is a 81 y.o. male with a PMH of DM, hyperlipidemia, CHF, CAD who presents to the ED with generalized weakness x 1 week, which worsened today. He also reports bilateral knee pain, which he states he has at baseline, but has become more severe over the past week. He states this has caused him difficulty ambulating at home. His wife is present at bedside, who reports he typically uses a cane, but that today he had to use a walker. He reports subjective fever and chills. He reports mild headache. He denies lightheadedness, dizziness, syncope. He reports nonproductive cough, which started last night. He denies recent illness or sick contact. He denies abdominal pain, nausea, vomiting, constipation, dysuria, urgency, frequency. He states he had one episode of loose stool 2 days ago, which resolved after taking imodium. He denies blood in stool.  Past Medical History  Diagnosis Date  . Diabetes mellitus     type 2  . Diverticulosis of colon   . GERD (gastroesophageal reflux disease)   . Osteoarthritis   . Stress incontinence, male     urge  . Dyslipidemia   . Hernia, hiatal   . Hyperlipidemia   . Hypertension   . DVT (deep venous thrombosis)   . Overactive bladder     Hx: of  . IBS (irritable bowel syndrome)     Hx: of  . Pneumonia 2002    history of  . Varicose veins of lower extremities with other complications 05/17/3709  . Atherosclerosis of native arteries of the extremities with intermittent claudication 02/10/2013  . DIVERTICULOSIS, COLON 06/13/2007    Qualifier: Diagnosis of  By: Leanne Chang MD, Bruce    . DEGENERATIVE JOINT DISEASE, GENERALIZED 01/24/2010    Annotation: diffuse pains of joints Qualifier: Diagnosis of  By: Leanne Chang MD, Bruce    . Shingles 09/11/2013  . Chronic diastolic congestive  heart failure 11/23/2014  . CAD (coronary artery disease) 10/14/2014  . PERIPHERAL NEUROPATHY 05/30/2010    Annotation: likely DM related Qualifier: Diagnosis of  By: Leanne Chang MD, Bruce    . Edema 08/19/2014   Past Surgical History  Procedure Laterality Date  . Wrist tendon surgery    . Cts bilateral    . Knee arthroscopy  1988, 1992    bilateral  . Total knee arthroplasty  2005, 2007  . Lumbar left arm  1961  . Tonsillectomy    . Back surgery    . Colonoscopy      Hx: of  . Laminectomy  08/18/2013    DECOMPRESSIVE LAMINECTOMIES  . Lumbar laminectomy/decompression microdiscectomy N/A 08/18/2013    Procedure: Lumbar two-three, lumbar three-four, lumbar four-five decompressive laminectomies;  Surgeon: [name], MD;  Location: [facility];  Service: Neurosurgery;  Laterality: N/A;  . Left and right heart catheterization with coronary angiogram N/A 09/03/2014    Procedure: LEFT AND RIGHT HEART CATHETERIZATION WITH CORONARY ANGIOGRAM;  Surgeon: [name], MD;  Location: [facility] CATH LAB;  Service: Cardiovascular;  Laterality: N/A;  . Cataract extraction Bilateral 08/10/2015,08/15/2015   Family History  Problem Relation Age of Onset  . Other Mother 28    CABG  . Heart disease Mother   . Hypertension Mother   . Hyperlipidemia Son  Social History  Substance Use Topics  . Smoking status: Never Smoker   . Smokeless tobacco: Never Used  . Alcohol Use: No      Review of Systems  Constitutional: Positive for fever, chills, activity change, appetite change and fatigue.       Reports decreased appetite.  HENT: Negative for congestion.   Respiratory: Positive for cough. Negative for shortness of breath.   Cardiovascular: Negative for chest pain and leg swelling.  Gastrointestinal: Positive for diarrhea. Negative for nausea, vomiting, abdominal pain and constipation.  Genitourinary: Negative for dysuria, urgency and frequency.  Musculoskeletal: Positive for arthralgias and neck  pain. Negative for myalgias, back pain and neck stiffness.       Reports neck pain unchanged from baseline. Reports bilateral knee pain.  Neurological: Positive for weakness and headaches. Negative for dizziness, syncope, light-headedness and numbness.       Reports generalized weakness.  All other systems reviewed and are negative.     Allergies  Ciprofloxacin and Sulfamethoxazole  Home Medications   Prior to Admission medications   Medication Sig Start Date End Date Taking? Authorizing Provider  amLODipine (NORVASC) 5 MG tablet Take 5 mg by mouth daily.    Yes Historical Provider, MD  aspirin 81 MG tablet Take 81 mg by mouth daily.    Yes Historical Provider, MD  b complex vitamins tablet Take 1 tablet by mouth daily.   Yes Historical Provider, MD  BESIVANCE 0.6 % SUSP Place 1 drop into the right eye 3 (three) times daily. Stopping 08-31-15 08/11/15  Yes Historical Provider, MD  Cholecalciferol (VITAMIN D PO) Take 1 capsule by mouth daily.   Yes Historical Provider, MD  doxazosin (CARDURA) 4 MG tablet TAKE ONE TABLET BY MOUTH DAILY 04/09/15  Yes [name], DO  DUREZOL 0.05 % EMUL Apply 1 drop to eye. BID for 1 week (9-13 to 9-20), QD for 1 week (9-20 to 9-27), then stop - RIGHT EYE QD for 1 week in LEFT EYE 9-14 to 09-01-15 08/18/15  Yes Historical Provider, MD  furosemide (LASIX) 20 MG tablet Take 2 tablets (40 mg total) by mouth daily as needed. Patient taking differently: Take 20 mg by mouth every other day.  11/12/14  Yes Lelon Perla, MD  insulin aspart (NOVOLOG FLEXPEN) 100 UNIT/ML FlexPen Inject 8-12 Units into the skin 3 (three) times daily with meals. Patient taking differently: Inject 8-12 Units into the skin 3 (three) times daily with meals. Sliding scale 04/13/15  Yes Philemon Kingdom, MD  Insulin Detemir (LEVEMIR FLEXTOUCH) 100 UNIT/ML Pen INJECT 20 UNITS INTO THE SKIN DAILY AT 10 PM. 04/13/15  Yes Philemon Kingdom, MD  metFORMIN (GLUCOPHAGE) 1000 MG tablet Take 1 tablet  (1,000 mg total) by mouth 2 (two) times daily with a meal. 04/13/15  Yes Philemon Kingdom, MD  Multiple Vitamins-Minerals (CENTRUM SILVER PO) Take 1 tablet by mouth daily.    Yes Historical Provider, MD  naproxen sodium (ANAPROX) 220 MG tablet Take 220 mg by mouth 2 (two) times daily with a meal.   Yes Historical Provider, MD  omeprazole (PRILOSEC) 20 MG capsule TAKE 1 CAPSULE (20 MG TOTAL) BY MOUTH DAILY. 04/21/15  Yes [name], DO  pravastatin (PRAVACHOL) 40 MG tablet Take 1 tablet (40 mg total) by mouth every evening. 10/14/14 10/14/15 Yes Lelon Perla, MD  PROLENSA 0.07 % SOLN Place 1 drop into both eyes every evening. Stop in left eye 09-07-15 right eye on 09-14-15 08/11/15  Yes Historical Provider, MD  BP 131/69 mmHg  Pulse 76  Temp(Src) 98.8 F (37.1 C) (Oral)  Resp 18  Ht 5\' 10"  (1.778 m)  Wt 193 lb 9 oz (87.8 kg)  BMI 27.77 kg/m2  SpO2 96% Physical Exam  Constitutional: He is oriented to person, place, and time. He appears well-developed and well-nourished. No distress.  HENT:  Head: Normocephalic and atraumatic.  Right Ear: External ear normal.  Left Ear: External ear normal.  Nose: Nose normal.  Mouth/Throat: Uvula is midline, oropharynx is clear and moist and mucous membranes are normal.  Eyes: Conjunctivae, EOM and lids are normal. Pupils are equal, round, and reactive to light. Right eye exhibits no discharge. Left eye exhibits no discharge. No scleral icterus.  Neck: Normal range of motion. Neck supple.  Mild tenderness to palpation of cervical paraspinal muscles. No step-off or palpable deformity.  Cardiovascular: Normal rate, regular rhythm, normal heart sounds, intact distal pulses and normal pulses.   Regular rate and rhythm with frequent ectopy.  Pulmonary/Chest: Effort normal and breath sounds normal. No respiratory distress. He has no wheezes. He has no rales.  Abdominal: Soft. Normal appearance and bowel sounds are normal. He exhibits no distension and no mass.  There is no tenderness. There is no rigidity, no rebound and no guarding.  Musculoskeletal: Normal range of motion. He exhibits tenderness. He exhibits no edema.  Diffuse tenderness to palpation of the knees bilaterally. No edema or effusion. Full range of motion of lower extremities bilaterally.  Neurological: He is alert and oriented to person, place, and time. He has normal strength. No cranial nerve deficit or sensory deficit.  Skin: Skin is warm, dry and intact. No rash noted. He is not diaphoretic. No erythema. No pallor.  Psychiatric: He has a normal mood and affect. His speech is normal and behavior is normal. Judgment and thought content normal.  Nursing note and vitals reviewed.   ED Course  Procedures (including critical care time)  Labs Review Labs Reviewed  URINALYSIS, ROUTINE W REFLEX MICROSCOPIC (NOT AT [facility] - Abnormal; Notable for the following:    Color, Urine AMBER (*)    APPearance CLOUDY (*)    Hgb urine dipstick LARGE (*)    Ketones, ur 15 (*)    Protein, ur 100 (*)    All other components within normal limits  COMPREHENSIVE METABOLIC PANEL - Abnormal; Notable for the following:    Sodium 134 (*)    Chloride 100 (*)    Glucose, Bld 110 (*)    BUN 23 (*)    Calcium 8.5 (*)    Albumin 3.3 (*)    AST 81 (*)    All other components within normal limits  CBC WITH DIFFERENTIAL/PLATELET - Abnormal; Notable for the following:    WBC 14.1 (*)    RBC 3.54 (*)    Hemoglobin 10.1 (*)    HCT 30.3 (*)    Neutro Abs 11.4 (*)    All other components within normal limits  PROTIME-INR - Abnormal; Notable for the following:    Prothrombin Time 16.9 (*)    All other components within normal limits  GLUCOSE, CAPILLARY - Abnormal; Notable for the following:    Glucose-Capillary 105 (*)    All other components within normal limits  LIPASE, BLOOD - Abnormal; Notable for the following:    Lipase 12 (*)    All other components within normal limits  CBG MONITORING, ED -  Abnormal; Notable for the following:    Glucose-Capillary 101 (*)  All other components within normal limits  CULTURE, BLOOD (ROUTINE X 2)  CULTURE, BLOOD (ROUTINE X 2)  URINE CULTURE  STOOL CULTURE  GRAM STAIN  CULTURE, EXPECTORATED SPUTUM-ASSESSMENT  URINE MICROSCOPIC-ADD ON  LACTIC ACID, PLASMA  LACTIC ACID, PLASMA  PROCALCITONIN  APTT  BRAIN NATRIURETIC PEPTIDE  GI PATHOGEN PANEL BY PCR, STOOL  COMPREHENSIVE METABOLIC PANEL  CBC  INFLUENZA PANEL BY PCR (TYPE A & B, H1N1)(NOT AT [facility])  LEGIONELLA ANTIGEN, URINE  STREP PNEUMONIAE URINARY ANTIGEN  I-STAT TROPOININ, ED  I-STAT CG4 LACTIC ACID, ED  I-STAT CG4 LACTIC ACID, ED  CBG MONITORING, ED    Imaging Review Dg Chest 2 View  08/26/2015   CLINICAL DATA:  Chest pain radiating to the left arm.  EXAM: CHEST  2 VIEW  COMPARISON:  08/13/2013  FINDINGS: Cardiomediastinal silhouette is enlarged. Mediastinal contours appear intact.  There is no evidence of focal airspace consolidation, pleural effusion or pneumothorax.  Osseous structures are without acute abnormality. The changes of thoracic spine diffuse idiopathic skeletal hyperostosis are seen. Soft tissues are grossly normal.  IMPRESSION: Enlarged cardiac silhouette, otherwise no radiographic evidence of acute cardiopulmonary abnormality.   Electronically Signed   By: Fidela Salisbury M.D.   On: 08/26/2015 17:43   Ct Abdomen Pelvis W Contrast  08/26/2015   CLINICAL DATA:  81 year old male with abdominal pain, weakness, and diarrhea.  EXAM: CT ABDOMEN AND PELVIS WITH CONTRAST  TECHNIQUE: Multidetector CT imaging of the abdomen and pelvis was performed using the standard protocol following bolus administration of intravenous contrast.  CONTRAST:  15mL OMNIPAQUE IOHEXOL 300 MG/ML  SOLN  COMPARISON:  Chest radiograph dated 08/26/2015, lumbar spine radiograph dated 08/05/2015.  FINDINGS: There is a patchy area of airspace opacity at the left lung base most compatible with pneumonia.  Clinical correlation and follow-up resolution recommended. There is coronary vascular calcification. No intra-abdominal free air or free fluid identified.  Subcentimeter left hepatic hypodense lesion, too small to characterize. Gallstone. No pericholecystic fluid. The pancreas, spleen, adrenal glands appear unremarkable. There is a 6.3 x 4.8 cm cyst with partially calcified wall in the inferior pole of the right kidney. Ultrasound may provide better evaluation. Small left renal hypodense lesions are not well characterized but likely represent cysts. There is no hydronephrosis on either side. The visualized ureters and urinary bladder appear unremarkable. The prostate and seminal vesicles are grossly unremarkable.  There is extensive colonic diverticulosis without active inflammation. There is apparent segmental thickening of the sigmoid colon without associated inflammatory changes, likely related to underdistention. Colitis is less likely but not excluded. Clinical correlation is recommended. Moderate stool noted throughout the colon. No evidence of bowel obstruction. The appendix is not visualized with certainty. No inflammatory changes identified in the right lower quadrant. Small hiatal hernia.  There is aortoiliac atherosclerotic disease. The abdominal aorta is tortuous. No portal venous gas identified. There is no lymphadenopathy.  Small fat containing umbilical hernia. Degenerative changes of the spine. Lower lumbar laminectomy. No acute fracture.  IMPRESSION: Left lower lobe pneumonia. Clinical correlation and follow-up resolution recommended.  Diverticulosis.  Underdistention versus less likely segmental colitis of the sigmoid colon. Clinical correlation recommended.  Cholelithiasis.  Right renal cyst.   Electronically Signed   By: Anner Crete M.D.   On: 08/26/2015 21:40     I have personally reviewed and evaluated these images and lab results as part of my medical decision-making.   EKG  Interpretation   Date/Time:  Thursday August 26 2015 16:10:00 EDT Ventricular Rate:  78 PR Interval:  125 QRS Duration: 101 QT Interval:  394 QTC Calculation: 449 R Axis:   -68 Text Interpretation:  Sinus rhythm Multiple ventricular premature  complexes Left anterior fascicular block Abnormal R-wave progression, late  transition No significant change since last tracing although multiple PVCs  now present Confirmed by NGUYEN, EMILY ([id number]) on 08/26/2015 6:46:52 PM      MDM   Final diagnoses:  Fever, unspecified fever cause  Abdominal pain    81 year old male presents to the emergency department with generalized weakness over the past week. Reports subjective fever and chills. Reports mild headache. Denies lightheadedness, dizziness, syncope. Reports nonproductive cough, which started last night. Denies recent illness or sick contact. Denies abdominal pain, nausea, vomiting, constipation, dysuria, urgency, frequency.   Patient febrile to 103 (rectal). Tylenol given for fever. No tachycardia or hypotension. Heart regular rate and rhythm with frequent ectopy. Lungs clear to auscultation bilaterally. Abdomen soft, nontender, nondistended. Diffuse tenderness to palpation of knees bilaterally. No significant edema or effusion. CBC with leukocytosis of 14.1, hemoglobin 10.1. Lactic acid within normal limits. Urinalysis negative for infection. EKG no evidence of acute ischemia, troponin 0.08. Chest x-ray with enlarged cardiac silhouette, otherwise no acute cardiopulmonary abnormality. Patient given 1 L bolus normal saline  Patient to be admitted. Hospitalist consulted. Spoke with Dr. Blaine Hamper, will see the patient in the ED and admit.  BP 131/69 mmHg  Pulse 76  Temp(Src) 98.8 F (37.1 C) (Oral)  Resp 18  Ht 5\' 10"  (1.778 m)  Wt 193 lb 9 oz (87.8 kg)  BMI 27.77 kg/m2  SpO2 96%        Marella Chimes, PA-C 08/27/15 [place], MD 08/27/15 [phone]

## 2015-08-27 DIAGNOSIS — I5032 Chronic diastolic (congestive) heart failure: Secondary | ICD-10-CM

## 2015-08-27 DIAGNOSIS — G629 Polyneuropathy, unspecified: Secondary | ICD-10-CM

## 2015-08-27 DIAGNOSIS — R509 Fever, unspecified: Secondary | ICD-10-CM

## 2015-08-27 DIAGNOSIS — A419 Sepsis, unspecified organism: Principal | ICD-10-CM

## 2015-08-27 DIAGNOSIS — E1142 Type 2 diabetes mellitus with diabetic polyneuropathy: Secondary | ICD-10-CM

## 2015-08-27 DIAGNOSIS — R531 Weakness: Secondary | ICD-10-CM

## 2015-08-27 DIAGNOSIS — J189 Pneumonia, unspecified organism: Secondary | ICD-10-CM

## 2015-08-27 DIAGNOSIS — E1159 Type 2 diabetes mellitus with other circulatory complications: Secondary | ICD-10-CM

## 2015-08-27 LAB — GLUCOSE, CAPILLARY
GLUCOSE-CAPILLARY: 117 mg/dL — AB (ref 65–99)
GLUCOSE-CAPILLARY: 206 mg/dL — AB (ref 65–99)
Glucose-Capillary: 170 mg/dL — ABNORMAL HIGH (ref 65–99)
Glucose-Capillary: 179 mg/dL — ABNORMAL HIGH (ref 65–99)

## 2015-08-27 LAB — COMPREHENSIVE METABOLIC PANEL
ALT: 50 U/L (ref 17–63)
AST: 67 U/L — AB (ref 15–41)
Albumin: 2.6 g/dL — ABNORMAL LOW (ref 3.5–5.0)
Alkaline Phosphatase: 50 U/L (ref 38–126)
Anion gap: 9 (ref 5–15)
BILIRUBIN TOTAL: 0.9 mg/dL (ref 0.3–1.2)
BUN: 17 mg/dL (ref 6–20)
CHLORIDE: 102 mmol/L (ref 101–111)
CO2: 23 mmol/L (ref 22–32)
CREATININE: 0.81 mg/dL (ref 0.61–1.24)
Calcium: 8.1 mg/dL — ABNORMAL LOW (ref 8.9–10.3)
GFR calc Af Amer: >60 mL/min (ref >60)
Glucose, Bld: 160 mg/dL — ABNORMAL HIGH (ref 65–99)
Potassium: 3.6 mmol/L (ref 3.5–5.1)
Sodium: 134 mmol/L — ABNORMAL LOW (ref 135–145)
Total Protein: 6.3 g/dL — ABNORMAL LOW (ref 6.5–8.1)

## 2015-08-27 LAB — CBC
HCT: 30.3 % — ABNORMAL LOW (ref 39.0–52.0)
Hemoglobin: 10.1 g/dL — ABNORMAL LOW (ref 13.0–17.0)
MCH: 29 pg (ref 26.0–34.0)
MCHC: 33.3 g/dL (ref 30.0–36.0)
MCV: 87.1 fL (ref 78.0–100.0)
PLATELETS: 243 10*3/uL (ref 150–400)
RBC: 3.48 MIL/uL — ABNORMAL LOW (ref 4.22–5.81)
RDW: 14.4 % (ref 11.5–15.5)
WBC: 10.3 10*3/uL (ref 4.0–10.5)

## 2015-08-27 LAB — LIPASE, BLOOD: LIPASE: 12 U/L — AB (ref 22–51)

## 2015-08-27 LAB — EXPECTORATED SPUTUM ASSESSMENT W REFEX TO RESP CULTURE: SPECIAL REQUESTS: NORMAL

## 2015-08-27 LAB — EXPECTORATED SPUTUM ASSESSMENT W GRAM STAIN, RFLX TO RESP C

## 2015-08-27 LAB — INFLUENZA PANEL BY PCR (TYPE A & B)
H1N1FLUPCR: NOT DETECTED
Influenza A By PCR: NEGATIVE
Influenza B By PCR: NEGATIVE

## 2015-08-27 LAB — LACTIC ACID, PLASMA: Lactic Acid, Venous: 0.9 mmol/L (ref 0.5–2.0)

## 2015-08-27 LAB — STREP PNEUMONIAE URINARY ANTIGEN: STREP PNEUMO URINARY ANTIGEN: NEGATIVE

## 2015-08-27 LAB — BRAIN NATRIURETIC PEPTIDE: B NATRIURETIC PEPTIDE 5: 534.6 pg/mL — AB (ref 0.0–100.0)

## 2015-08-27 NOTE — Evaluation (Signed)
Occupational Therapy Evaluation Patient Details Name: ARON INGE MRN: [id number] DOB: 1933-01-10 Today's Date: 08/27/2015    History of Present Illness Pt is a 81 y.o. male with PMH of hypertension, bilateral knee replacements, hyperlipidemia, diabetes mellitus, GERD, diverticulosis, arthritis, DVT, IBS, diastolic congestive heart failure (EF 60-65% with grade 1 diastolic distortion), CAD, peripheral neuropathy, chronic neck pain, chronic knee pain, sleeping a lot at at normal baseline, who presents with generalized weakness, fever, abdominal pain, diarrhea   Clinical Impression   Pt up to perform oral care and washing face at the sink in the bathroom with min guard assist. Needs some assist for LB self care as he states he is not supposed to lean forward after his cataract surgery right now and is having difficulty with crossing LEs up. Wife states she can help with LB dressing. Recommend HHOT for safety and UE strengthening as well as energy conservation strategies. Will follow on acute.     Follow Up Recommendations  Home health OT;Supervision/Assistance - 24 hour    Equipment Recommendations  3 in 1 bedside comode (assess for 3in1 versus none)    Recommendations for Other Services       Precautions / Restrictions Precautions Precautions: Fall Restrictions Weight Bearing Restrictions: No      Mobility Bed Mobility Overal bed mobility: Needs Assistance Bed Mobility: Supine to Sit     Supine to sit: Supervision     General bed mobility comments: supervision for safety as pt reports some lightheadedness with initially sitting up.  Transfers Overall transfer level: Needs assistance Equipment used: Rolling walker (2 wheeled) Transfers: Sit to/from Stand Sit to Stand: Min guard         General transfer comment: cues for hand placement.    Balance Overall balance assessment: Needs assistance   Sitting balance-Leahy Scale: Good       Standing balance-Leahy  Scale: Good                              ADL Overall ADL's : Needs assistance/impaired Eating/Feeding: Independent;Sitting   Grooming: Oral care;Wash/dry face;Min guard;Standing   Upper Body Bathing: Set up;Sitting   Lower Body Bathing: Minimal assistance;Sit to/from stand   Upper Body Dressing : Set up;Sitting   Lower Body Dressing: Minimal assistance;Moderate assistance;Sit to/from stand   Toilet Transfer: Min guard;Minimal assistance;Ambulation;RW   Toileting- Water quality scientist and Hygiene: Min guard;Sit to/from stand         General ADL Comments: Pt transferred into the bathroom to perform oral care standing at the sink. With increased time standing, pt states bilateral LE start to feel shaky but no LOb observed. Pt overall at min guard to min assist to manage tight space in the bathroom with the walker safely. Discussed safety strategies to sit down to thread clothing on over LEs and then stand to pull up since pt with bilateral LE pain and weakness. Also discussed use of shower seat for safety.      Vision Additional Comments: recent cataract surgery. states vision is good.   Perception     Praxis      Pertinent Vitals/Pain Pain Assessment: 0-10 Pain Score: 5  Pain Location: bilateral knee up to hip  Pain Descriptors / Indicators: Sore Pain Intervention(s): Repositioned;Monitored during session     Hand Dominance Right   Extremity/Trunk Assessment Upper Extremity Assessment Upper Extremity Assessment: Generalized weakness RUE Deficits / Details: unable to fully raise UE overhead. grossly 150 degrees  shoulder flexion. with AAROM, pt states some discomfort. Pt states UEs are fatigued/sore with walker use recently.  LUE Deficits / Details: as above.            Communication Communication Communication: No difficulties   Cognition Arousal/Alertness: Awake/alert Behavior During Therapy: WFL for tasks assessed/performed Overall Cognitive  Status: Within Functional Limits for tasks assessed                     General Comments       Exercises       Shoulder Instructions      Home Living Family/patient expects to be discharged to:: Private residence Living Arrangements: Spouse/significant other   Type of Home: House Home Access: Stairs to enter [employer] of Steps: 2 Entrance Stairs-Rails: Right;Left Home Layout: One level     Bathroom Shower/Tub: Occupational psychologist: [place]: Environmental consultant - 2 wheels;Cane - single point;Grab bars - toilet;Grab bars - tub/shower;Shower seat - built in          Prior Functioning/Environment Level of Independence: Independent with assistive device(s)        Comments: states he has used the walker in the last 24 hours and it has helped    OT Diagnosis: Generalized weakness   OT Problem List: Decreased strength;Decreased knowledge of use of DME or AE   OT Treatment/Interventions: Self-care/ADL training;Patient/family education;Therapeutic activities;DME and/or AE instruction    OT Goals(Current goals can be found in the care plan section) Acute Rehab OT Goals Patient Stated Goal: to get stronger. OT Goal Formulation: With patient/family Time For Goal Achievement: 09/10/15 Potential to Achieve Goals: Good  OT Frequency: Min 2X/week   Barriers to D/C:            Co-evaluation PT/OT/SLP Co-Evaluation/Treatment: Yes Reason for Co-Treatment: Other (comment) (fatigue)   OT goals addressed during session: ADL's and self-care;Proper use of Adaptive equipment and DME      End of Session Equipment Utilized During Treatment: Rolling walker  Activity Tolerance: Patient tolerated treatment well Patient left: in chair;with call bell/phone within reach;with family/visitor present   Time: 8891-6945 OT Time Calculation (min): 23 min Charges:  OT General Charges $OT Visit: 1 Procedure OT Evaluation $Initial OT Evaluation  Tier I: 1 Procedure G-Codes:    Jules Schick  [phone] 08/27/2015, 11:39 AM

## 2015-08-27 NOTE — Evaluation (Signed)
Physical Therapy Evaluation Patient Details Name: [name] MRN: [id number] DOB: [birth year] Today's Date: 08/27/2015   History of Present Illness  Pt is a 81 y.o. male with PMH of hypertension, bilateral knee replacements, hyperlipidemia, diabetes mellitus, GERD, diverticulosis, arthritis, DVT, IBS, diastolic congestive heart failure (EF 60-65% with grade 1 diastolic distortion), CAD, peripheral neuropathy, chronic neck pain, chronic knee pain, sleeping a lot at at normal baseline, who presents with generalized weakness, fever, abdominal pain, diarrhea  Clinical Impression  On eval, pt required Min guard assist for mobility-walked ~75 feet with RW. Pain rated 5/10 bil LEs. Pt fatigues fairly easily. Recommend HHPT and supervision for OOB/mobility. Pt states he already has a RW-recommended he use it for ambulation safety since LEs are weak.     Follow Up Recommendations Home health PT;Supervision for mobility/OOB    Equipment Recommendations  None recommended by PT    Recommendations for Other Services OT consult     Precautions / Restrictions Precautions Precautions: Fall Restrictions Weight Bearing Restrictions: No      Mobility  Bed Mobility Overal bed mobility: Needs Assistance Bed Mobility: Supine to Sit     Supine to sit: Supervision     General bed mobility comments: supervision for safety as pt reports some lightheadedness with initially sitting up.  Transfers Overall transfer level: Needs assistance Equipment used: Rolling walker (2 wheeled) Transfers: Sit to/from Stand Sit to Stand: Min guard         General transfer comment: close guard for safety.  Ambulation/Gait Ambulation/Gait assistance: Min guard Ambulation Distance (Feet): 75 Feet Assistive device: Rolling walker (2 wheeled) Gait Pattern/deviations: Step-through pattern;Decreased stride length     General Gait Details: close guard for safety. No LE buckling noted. Pt fatigues fairly  easily.   Stairs            Wheelchair Mobility    Modified Rankin (Stroke Patients Only)       Balance Overall balance assessment: Needs assistance   Sitting balance-Leahy Scale: Good       Standing balance-Leahy Scale: Good                               Pertinent Vitals/Pain Pain Assessment: 0-10 Pain Score: 5  Pain Location: bil LEs (knees to hips) Pain Descriptors / Indicators: Sore Pain Intervention(s): Monitored during session;Repositioned    Home Living Family/patient expects to be discharged to:: Private residence Living Arrangements: Spouse/significant other   Type of Home: House Home Access: Stairs to enter Entrance Stairs-Rails: Psychiatric nurse of Steps: 2 Home Layout: One level Home Equipment: Environmental consultant - 2 wheels;Cane - single point;Grab bars - toilet;Grab bars - tub/shower;Shower seat - built in      Prior Function Level of Independence: Independent with assistive device(s)         Comments: states he has used the walker in the last 24 hours and it has helped     Hand Dominance   Dominant Hand: Right    Extremity/Trunk Assessment   Upper Extremity Assessment: Defer to OT evaluation RUE Deficits / Details: unable to fully raise UE overhead. grossly 150 degrees shoulder flexion. with AAROM, pt states some discomfort. Pt states UEs are fatigued/sore with walker use recently.      LUE Deficits / Details: as above.    Lower Extremity Assessment: Generalized weakness      Cervical / Trunk Assessment: Normal  Communication   Communication: No difficulties  Cognition  Arousal/Alertness: Awake/alert Behavior During Therapy: WFL for tasks assessed/performed Overall Cognitive Status: Within Functional Limits for tasks assessed                      General Comments      Exercises        Assessment/Plan    PT Assessment Patient needs continued PT services  PT Diagnosis Difficulty  walking;Acute pain;Generalized weakness   PT Problem List Decreased strength;Decreased activity tolerance;Decreased balance;Decreased mobility;Decreased knowledge of use of DME;Pain  PT Treatment Interventions DME instruction;Gait training;Functional mobility training;Therapeutic activities;Patient/family education;Balance training;Therapeutic exercise   PT Goals (Current goals can be found in the Care Plan section) Acute Rehab PT Goals Patient Stated Goal: to get stronger. PT Goal Formulation: With patient/family Time For Goal Achievement: 09/10/15 Potential to Achieve Goals: Good    Frequency Min 3X/week   Barriers to discharge        Co-evaluation   Reason for Co-Treatment: Other (comment) (fatigue)   OT goals addressed during session: ADL's and self-care;Proper use of Adaptive equipment and DME       End of Session Equipment Utilized During Treatment: Gait belt Activity Tolerance: Patient tolerated treatment well Patient left: in chair;with call bell/phone within reach;with family/visitor present           Time: 9373-4287 PT Time Calculation (min) (ACUTE ONLY): 31 min   Charges:   PT Evaluation $Initial PT Evaluation Tier I: 1 Procedure     PT G Codes:        Weston Anna, MPT Pager: [phone]

## 2015-08-27 NOTE — Care Management Note (Signed)
Case Management Note  Patient Details  Name: [name] MRN: [id number] Date of Birth: 03-Feb-1933  Subjective/Objective:  81 y/o m admitted w/Sepsis. From home. PT cons-await recommendations.                  Action/Plan:dj/c plan home.   Expected Discharge Date:   (unknown)               Expected Discharge Plan:  [place]  In-House Referral:     Discharge planning Services  CM Consult  Post Acute Care Choice:    Choice offered to:     DME Arranged:    DME Agency:     HH Arranged:    HH Agency:     Status of Service:  In process, will continue to follow  Medicare Important Message Given:    Date Medicare IM Given:    Medicare IM give by:    Date Additional Medicare IM Given:    Additional Medicare Important Message give by:     If discussed at [place] of Stay Meetings, dates discussed:    Additional Comments:  Dessa Phi, RN 08/27/2015, 10:50 AM

## 2015-08-27 NOTE — Progress Notes (Signed)
Patient ID: [name], male   DOB: [birth year], 81 y.o.   MRN: [id number]  [facility] PROGRESS NOTE  [name] BSJ:[id number] DOB: 10/07/1933 DOA: 08/26/2015 PCP: [name]., DO   Brief narrative:    81 y.o. male with hypertension, hyperlipidemia, diabetes mellitus, GERD, diverticulosis, arthritis, DVT, IBS, diastolic congestive heart failure (EF 60-65% with grade 1 diastolic distortion), CAD, peripheral neuropathy, presented with generalized weakness, fever, abdominal pain, diarrhea several weeks in duration.   In ED, patient was found to have WBC 14.3, fever with temperature 103.3, lactate 1.24, troponin negative, negative urinalysis for UTI, electrolytes okay, renal function okay, negative chest x-ray for acute abnormalities. Patient's admitted to inpatient for further evaluation and treatment.   Assessment/Plan:   Sepsis secondary to LLL PNA, unknown pathogen at this time, present on admission - pt met sepsis criteria on admission - PNA is the source - continue vancomycin and zosyn for now - imaging studies reviewed with pt and family - CT abd was not very specific for colitis as initially suspected but current abx should be adequate in that case as well  - follow up on blood, urine, stool studies - repeat CBC in AM and transition to oral ABX as soon as possible   Diarrhea, abd pain - possibly related to PNA vs undelrying colitis but this was not very impressive on CT abd  - follow upon stool studies - provide analgesia, antiemetics as needed   DM-II - Last A1c 7.7 on 03/16/15, not well controled. Patient is taking metformin and Levemir at home - decreased Lantus dose from 20 units to 14 units daily until oral intake improves  -SSI  HLD - Last LDL was 43 on 03/26/15 - Continue home medications: Pravastatin  HTN: - hold home amlodipine and lasix since patient is at risk of developing hypotension - When necessary hydralazine IV - Patient is on Cardura which  is for stress incontinence.   GERD: - switched PPI to pepcid IV until C diff pcr negative  CAD (coronary artery disease): no chest pain. - continue ASA  OAB (overactive bladder): - Continue Cardura  Chronic diastolic congestive heart failure:  - 2-D echo on 08/26/14 showed EF 60-65% with grade 1 diastolic dysfunction. Patient is on Lasix 40 mg prn at home. - CHF is compensated on admission. - Hold Lasix since patient in IV fluids due to sepsis  DVT ppx: SQ Lovenox  Code Status: Full.  Family Communication:  plan of care discussed with the patient and wife at bedside  Disposition Plan: Home when stable.   IV access:  Peripheral IV  Procedures and diagnostic studies:    Dg Chest 2 View  08/26/2015   CLINICAL DATA:  Chest pain radiating to the left arm.  EXAM: CHEST  2 VIEW  COMPARISON:  08/13/2013  FINDINGS: Cardiomediastinal silhouette is enlarged. Mediastinal contours appear intact.  There is no evidence of focal airspace consolidation, pleural effusion or pneumothorax.  Osseous structures are without acute abnormality. The changes of thoracic spine diffuse idiopathic skeletal hyperostosis are seen. Soft tissues are grossly normal.  IMPRESSION: Enlarged cardiac silhouette, otherwise no radiographic evidence of acute cardiopulmonary abnormality.   Electronically Signed   By: Fidela Salisbury M.D.   On: 08/26/2015 17:43   Ct Abdomen Pelvis W Contrast  08/26/2015   CLINICAL DATA:  81 year old male with abdominal pain, weakness, and diarrhea.  EXAM: CT ABDOMEN AND PELVIS WITH CONTRAST  TECHNIQUE: Multidetector CT imaging of the abdomen and pelvis was performed  using the standard protocol following bolus administration of intravenous contrast.  CONTRAST:  151m OMNIPAQUE IOHEXOL 300 MG/ML  SOLN  COMPARISON:  Chest radiograph dated 08/26/2015, lumbar spine radiograph dated 08/05/2015.  FINDINGS: There is a patchy area of airspace opacity at the left lung base most compatible with pneumonia.  Clinical correlation and follow-up resolution recommended. There is coronary vascular calcification. No intra-abdominal free air or free fluid identified.  Subcentimeter left hepatic hypodense lesion, too small to characterize. Gallstone. No pericholecystic fluid. The pancreas, spleen, adrenal glands appear unremarkable. There is a 6.3 x 4.8 cm cyst with partially calcified wall in the inferior pole of the right kidney. Ultrasound may provide better evaluation. Small left renal hypodense lesions are not well characterized but likely represent cysts. There is no hydronephrosis on either side. The visualized ureters and urinary bladder appear unremarkable. The prostate and seminal vesicles are grossly unremarkable.  There is extensive colonic diverticulosis without active inflammation. There is apparent segmental thickening of the sigmoid colon without associated inflammatory changes, likely related to underdistention. Colitis is less likely but not excluded. Clinical correlation is recommended. Moderate stool noted throughout the colon. No evidence of bowel obstruction. The appendix is not visualized with certainty. No inflammatory changes identified in the right lower quadrant. Small hiatal hernia.  There is aortoiliac atherosclerotic disease. The abdominal aorta is tortuous. No portal venous gas identified. There is no lymphadenopathy.  Small fat containing umbilical hernia. Degenerative changes of the spine. Lower lumbar laminectomy. No acute fracture.  IMPRESSION: Left lower lobe pneumonia. Clinical correlation and follow-up resolution recommended.  Diverticulosis.  Underdistention versus less likely segmental colitis of the sigmoid colon. Clinical correlation recommended.  Cholelithiasis.  Right renal cyst.   Electronically Signed   By: AAnner CreteM.D.   On: 08/26/2015 21:40    Medical Consultants:  None   Other Consultants:  None  IAnti-Infectives:   Vancomycin 9/16 --> Zosyn 9/16  -->  MFaye Ramsay MD  [facility] Pager [phone] If 7PM-7AM, please contact night-coverage [email or web address] Password [email or web address]9/16/2016, 8:38 PM   LOS: 1 day   HPI/Subjective: No events overnight. Still with productive cough   Objective: Filed Vitals:   08/26/15 2314 08/27/15 0538 08/27/15 1400 08/27/15 2017  BP: 131/69 127/58 140/67 113/52  Pulse:  70 77 67  Temp:  99.8 F (37.7 C) 101 F (38.3 C) 99.8 F (37.7 C)  TempSrc:  Oral Oral Oral  Resp:  _0 Height:      Weight:  86.6 kg (190 lb 14.7 oz)    SpO2:  94% 97% 99%    Intake/Output Summary (Last 24 hours) at 08/27/15 2038 Last data filed at 08/27/15 1747  Gross per 24 hour  Intake 1161.25 ml  Output    750 ml  Net 411.25 ml    Exam:   General:  Pt is alert, follows commands appropriately, not in acute distress  Cardiovascular: Regular rate and rhythm, S1/S2, no murmurs, no rubs, no gallops  Respiratory: Clear to auscultation bilaterally, no wheezing, rhonchi at the LLL  Abdomen: Soft, non tender, non distended, bowel sounds present, no guarding  Extremities: No edema, pulses DP and PT palpable bilaterally  Neuro: Grossly nonfocal  Data Reviewed: Basic Metabolic Panel:  Recent Labs Lab 08/26/15 1636 08/27/15 0505  NA 134* 134*  K 3.6 3.6  CL 100* 102  CO2 22 23  GLUCOSE 110* 160*  BUN 23* 17  CREATININE 1.03 0.81  CALCIUM 8.5* 8.1*   Liver Function Tests:  Recent Labs Lab 08/26/15 1636 08/27/15 0505  AST 81* 67*  ALT 57 50  ALKPHOS 51 50  BILITOT 1.0 0.9  PROT 7.4 6.3*  ALBUMIN 3.3* 2.6*    Recent Labs Lab 08/26/15 2352  LIPASE 12*   No results for input(s): AMMONIA in the last 168 hours. CBC:  Recent Labs Lab 08/26/15 1636 08/27/15 0505  WBC 14.1* 10.3  NEUTROABS 11.4*  --   HGB 10.1* 10.1*  HCT 30.3* 30.3*  MCV 85.6 87.1  PLT 242 243   Cardiac Enzymes: No results for input(s): CKTOTAL, CKMB, CKMBINDEX, TROPONINI in the last 168 hours. BNP: Invalid  input(s): POCBNP CBG:  Recent Labs Lab 08/26/15 1956 08/26/15 2141 08/27/15 0733 08/27/15 1208 08/27/15 1620  GLUCAP 98 105* 170* 117* 206*    Recent Results (from the past 240 hour(s))  Urine culture     Status: None (Preliminary result)   Collection Time: 08/26/15  5:50 PM  Result Value Ref Range Status   Specimen Description URINE, CATHETERIZED  Final   Special Requests NONE  Final   Culture   Final    NO GROWTH < 24 HOURS Performed at [facility]    Report Status PENDING  Incomplete  Culture, sputum-assessment     Status: None   Collection Time: 08/27/15  6:21 AM  Result Value Ref Range Status   Specimen Description SPUTUM  Final   Special Requests Normal  Final   Sputum evaluation   Final    MICROSCOPIC FINDINGS SUGGEST THAT THIS SPECIMEN IS NOT REPRESENTATIVE OF LOWER RESPIRATORY SECRETIONS. PLEASE RECOLLECT. NOTIFIED J. SCOTTON RN AT [phone] ON 09.16.16 BY SHUEA    Report Status 08/27/2015 FINAL  Final     Scheduled Meds: . aspirin  81 mg Oral Daily  . B-complex with vitamin C  1 tablet Oral Daily  . cholecalciferol  400 Units Oral Daily  . dextromethorphan-guaiFENesin  1 tablet Oral BID  . Difluprednate  1 drop Ophthalmic BID  . doxazosin  4 mg Oral Daily  . enoxaparin (LOVENOX) injection  40 mg Subcutaneous Q24H  . famotidine (PEPCID) IV  20 mg Intravenous Q12H  . gatifloxacin  1 drop Both Eyes QID  . insulin aspart  0-9 Units Subcutaneous TID WC  . insulin detemir  14 Units Subcutaneous Q2200  . ketorolac  1 drop Both Eyes QID  . metronidazole  500 mg Intravenous 3 times per day  . multivitamin with minerals  1 tablet Oral Daily  . naproxen sodium  275 mg Oral BID WC  . piperacillin-tazobactam (ZOSYN)  IV  3.375 g Intravenous Q8H  . pravastatin  40 mg Oral QPM  . sodium chloride  3 mL Intravenous Q12H  . vancomycin  1,000 mg Intravenous Q12H   Continuous Infusions: . sodium chloride 75 mL/hr at 08/26/15 2028

## 2015-08-28 DIAGNOSIS — I10 Essential (primary) hypertension: Secondary | ICD-10-CM

## 2015-08-28 DIAGNOSIS — J189 Pneumonia, unspecified organism: Secondary | ICD-10-CM | POA: Diagnosis present

## 2015-08-28 DIAGNOSIS — J181 Lobar pneumonia, unspecified organism: Secondary | ICD-10-CM

## 2015-08-28 DIAGNOSIS — R197 Diarrhea, unspecified: Secondary | ICD-10-CM

## 2015-08-28 DIAGNOSIS — I2510 Atherosclerotic heart disease of native coronary artery without angina pectoris: Secondary | ICD-10-CM

## 2015-08-28 DIAGNOSIS — N3281 Overactive bladder: Secondary | ICD-10-CM

## 2015-08-28 DIAGNOSIS — E1149 Type 2 diabetes mellitus with other diabetic neurological complication: Secondary | ICD-10-CM

## 2015-08-28 DIAGNOSIS — I2583 Coronary atherosclerosis due to lipid rich plaque: Secondary | ICD-10-CM

## 2015-08-28 DIAGNOSIS — N318 Other neuromuscular dysfunction of bladder: Secondary | ICD-10-CM

## 2015-08-28 LAB — BASIC METABOLIC PANEL
ANION GAP: 6 (ref 5–15)
BUN: 12 mg/dL (ref 6–20)
CHLORIDE: 104 mmol/L (ref 101–111)
CO2: 25 mmol/L (ref 22–32)
Calcium: 8 mg/dL — ABNORMAL LOW (ref 8.9–10.3)
Creatinine, Ser: 0.74 mg/dL (ref 0.61–1.24)
GFR calc non Af Amer: >60 mL/min (ref >60)
Glucose, Bld: 152 mg/dL — ABNORMAL HIGH (ref 65–99)
POTASSIUM: 3.6 mmol/L (ref 3.5–5.1)
SODIUM: 135 mmol/L (ref 135–145)

## 2015-08-28 LAB — EXPECTORATED SPUTUM ASSESSMENT W REFEX TO RESP CULTURE

## 2015-08-28 LAB — CBC
HEMATOCRIT: 28.7 % — AB (ref 39.0–52.0)
HEMOGLOBIN: 9.4 g/dL — AB (ref 13.0–17.0)
MCH: 28.1 pg (ref 26.0–34.0)
MCHC: 32.8 g/dL (ref 30.0–36.0)
MCV: 85.7 fL (ref 78.0–100.0)
Platelets: 236 10*3/uL (ref 150–400)
RBC: 3.35 MIL/uL — AB (ref 4.22–5.81)
RDW: 14.5 % (ref 11.5–15.5)
WBC: 10.7 10*3/uL — AB (ref 4.0–10.5)

## 2015-08-28 LAB — URINE CULTURE: Culture: NO GROWTH

## 2015-08-28 LAB — EXPECTORATED SPUTUM ASSESSMENT W GRAM STAIN, RFLX TO RESP C

## 2015-08-28 LAB — GLUCOSE, CAPILLARY
GLUCOSE-CAPILLARY: 168 mg/dL — AB (ref 65–99)
GLUCOSE-CAPILLARY: 205 mg/dL — AB (ref 65–99)
GLUCOSE-CAPILLARY: 224 mg/dL — AB (ref 65–99)
Glucose-Capillary: 151 mg/dL — ABNORMAL HIGH (ref 65–99)

## 2015-08-28 MED ORDER — MAGIC MOUTHWASH
10.0000 mL | Freq: Three times a day (TID) | ORAL | Status: DC
  Administered 2015-08-28 – 2015-08-31 (×9): 10 mL via ORAL
  Filled 2015-08-28 (×12): qty 10

## 2015-08-28 NOTE — Progress Notes (Addendum)
Patient ID: [name], male   DOB: 15-Apr-1933, 81 y.o.   MRN: [id number]  [facility] PROGRESS NOTE  [name] XAJ:[id number] DOB: 1933/07/10 DOA: 08/26/2015 PCP: [name]., DO   Brief narrative:    81 y.o. male with hypertension, hyperlipidemia, diabetes mellitus, GERD, diverticulosis, arthritis, DVT, IBS, diastolic congestive heart failure (EF 60-65% with grade 1 diastolic distortion), CAD, peripheral neuropathy, presented with generalized weakness, fever, abdominal pain, diarrhea several weeks in duration.   In ED, patient was found to have WBC 14.3, fever with temperature 103.3, lactate 1.24, troponin negative, negative urinalysis for UTI, electrolytes okay, renal function okay, negative chest x-ray for acute abnormalities. Patient's admitted to inpatient for further evaluation and treatment.   Assessment/Plan:    Principal Problem:   Sepsis secondary to LLL PNA - pt met sepsis criteria on admission - PNA is the source, Tmax 101 F but fever curve overall trending down - continue vancomycin and zosyn day #3/7 - imaging studies reviewed with pt and family - CT abd was not very specific for colitis as initially suspected but current abx should be adequate in that case as well  - follow up on blood, urine, stool studies - repeat CBC in AM and transition to oral ABX once WBC stable and pt afebrile  - spent extensive time educating pt on use of IS, importance of deep breaths, relaxation techniques - pt demonstrated knowledge of IS use, inspiratory capacity up to 1500 cc this AM - asked to use Q hour 4-6 times while awake   Active Problems:   Left lower lobe pneumonia - continue ABX as noted above    Uncontrolled type 2 diabetes mellitus with peripheral circulatory disorder - Last A1c 7.7 on 03/16/15, not well controled. Patient is taking metformin and Levemir at home - decreased Lantus dose from 20 units to 14 units daily and seems to be sufficient for now - SSI  also provided as indicated     Essential hypertension - SBP 110 this AM - continue to hold home amlodipine and lasix since patient is at risk of developing hypotension - When necessary hydralazine IV is OK - Patient is on Cardura which is for stress incontinence.     Chronic diastolic congestive heart failure - 2-D echo on 08/26/14 showed EF 60-65% with grade 1 diastolic dysfunction. Patient is on Lasix 40 mg prn at home. - CHF is compensated at this time  - Hold Lasix for now until BP stabilizes     OAB (overactive bladder) - continue Cardura     Diarrhea - possibly related to PNA vs undelrying colitis but this was not very impressive on CT abd  - stool studies still pending  - provide analgesia, antiemetics as needed  - advanced diet to regular and pt tolerating well     Hyperlipemia - Last LDL was 43 on 03/26/15 - Continue home medication Pravastatin    GERD - switched PPI to pepcid IV until C diff pcr negative      CAD (coronary artery disease - no chest pain. - continue ASA  DVT prophylaxis - Lovenox SQ  Code Status: Full.  Family Communication:  plan of care discussed with the patient and wife at bedside  Disposition Plan: Home when stable.   IV access:  Peripheral IV  Procedures and diagnostic studies:    Dg Chest 2 View 08/26/2015  Enlarged cardiac silhouette, otherwise no radiographic evidence of acute cardiopulmonary abnormality.    Ct Abdomen Pelvis W Contrast  08/26/2015  Left lower lobe pneumonia. Clinical correlation and follow-up resolution recommended.  Diverticulosis.  Underdistention versus less likely segmental colitis of the sigmoid colon. Clinical correlation recommended.  Cholelithiasis.  Right renal cyst.     Medical Consultants:  None   Other Consultants:  None  IAnti-Infectives:   Vancomycin 9/16 --> Zosyn 9/16 -->  [name], MD  [facility] Pager [phone]  If 7PM-7AM, please contact night-coverage [email or web address] Password  [email or web address] 08/28/2015, 1:27 PM   LOS: 2 days   HPI/Subjective: No events overnight. Still with productive cough   Objective: Filed Vitals:   08/27/15 0538 08/27/15 1400 08/27/15 2017 08/28/15 0514  BP: 127/58 140/67 113/52 115/55  Pulse: 70 77 67 62  Temp: 99.8 F (37.7 C) 101 F (38.3 C) 99.8 F (37.7 C) 98.6 F (37 C)  TempSrc: Oral Oral Oral Oral  Resp: $Remo'20 18 18 16  'jewBN$ Height:      Weight: 86.6 kg (190 lb 14.7 oz)   90.2 kg (198 lb 13.7 oz)  SpO2: 94% 97% 99% 92%    Intake/Output Summary (Last 24 hours) at 08/28/15 1327 Last data filed at 08/28/15 0854  Gross per 24 hour  Intake 3433.75 ml  Output    700 ml  Net 2733.75 ml    Exam:   General:  Pt is alert, follows commands appropriately, not in acute distress  Cardiovascular: Regular rate and rhythm, S1/S2, no murmurs, no rubs, no gallops  Respiratory: Clear to auscultation bilaterally, no wheezing, rhonchi at the LLL remain with diminished breath sounds  Abdomen: Soft, non tender, non distended, bowel sounds present, no guarding  Extremities: No edema, pulses DP and PT palpable bilaterally  Neuro: Grossly nonfocal  Data Reviewed: Basic Metabolic Panel:  Recent Labs Lab 08/26/15 1636 08/27/15 0505 08/28/15 0504  NA 134* 134* 135  K 3.6 3.6 3.6  CL 100* 102 104  CO2 $Re'22 23 25  'dMJ$ GLUCOSE 110* 160* 152*  BUN 23* 17 12  CREATININE 1.03 0.81 0.74  CALCIUM 8.5* 8.1* 8.0*   Liver Function Tests:  Recent Labs Lab 08/26/15 1636 08/27/15 0505  AST 81* 67*  ALT 57 50  ALKPHOS 51 50  BILITOT 1.0 0.9  PROT 7.4 6.3*  ALBUMIN 3.3* 2.6*    Recent Labs Lab 08/26/15 2352  LIPASE 12*   No results for input(s): AMMONIA in the last 168 hours. CBC:  Recent Labs Lab 08/26/15 1636 08/27/15 0505 08/28/15 0504  WBC 14.1* 10.3 10.7*  NEUTROABS 11.4*  --   --   HGB 10.1* 10.1* 9.4*  HCT 30.3* 30.3* 28.7*  MCV 85.6 87.1 85.7  PLT 242 243 236   Cardiac Enzymes: No results for input(s): CKTOTAL, CKMB,  CKMBINDEX, TROPONINI in the last 168 hours. BNP: Invalid input(s): POCBNP CBG:  Recent Labs Lab 08/27/15 1208 08/27/15 1620 08/27/15 2204 08/28/15 0842 08/28/15 1147  GLUCAP 117* 206* 179* 168* 205*    Recent Results (from the past 240 hour(s))  Urine culture     Status: None   Collection Time: 08/26/15  5:50 PM  Result Value Ref Range Status   Specimen Description URINE, CATHETERIZED  Final   Special Requests NONE  Final   Culture   Final    NO GROWTH 2 DAYS Performed at [facility]    Report Status 08/28/2015 FINAL  Final  Culture, sputum-assessment     Status: None   Collection Time: 08/27/15  6:21 AM  Result Value Ref Range Status   Specimen Description SPUTUM  Final  Special Requests Normal  Final   Sputum evaluation   Final    MICROSCOPIC FINDINGS SUGGEST THAT THIS SPECIMEN IS NOT REPRESENTATIVE OF LOWER RESPIRATORY SECRETIONS. PLEASE RECOLLECT. NOTIFIED J. SCOTTON RN AT 1724 ON 09.16.16 BY SHUEA    Report Status 08/27/2015 FINAL  Final  Culture, expectorated sputum-assessment     Status: None   Collection Time: 08/28/15  6:04 AM  Result Value Ref Range Status   Specimen Description SPUTUM  Final   Special Requests Immunocompromised  Final   Sputum evaluation   Final    THIS SPECIMEN IS ACCEPTABLE. RESPIRATORY CULTURE REPORT TO FOLLOW.   Report Status 08/28/2015 FINAL  Final     Scheduled Meds: . aspirin  81 mg Oral Daily  . cholecalciferol  400 Units Oral Daily  . dextromethorphan-guaiFEN  1 tablet Oral BID  . Difluprednate  1 drop Ophthalmic BID  . doxazosin  4 mg Oral Daily  . enoxaparin  injection  40 mg Subcutaneous Q24H  . famotidine IV  20 mg Intravenous Q12H  . gatifloxacin  1 drop Both Eyes QID  . insulin aspart  0-9 Units Subcutaneous TID WC  . insulin detemir  14 Units Subcutaneous Q2200  . ketorolac  1 drop Both Eyes QID  . piperacillin-tazobactam (ZOSYN)  IV  3.375 g Intravenous Q8H  . pravastatin  40 mg Oral QPM  . vancomycin   1,000 mg Intravenous Q12H   Continuous Infusions:

## 2015-08-29 LAB — CBC
HEMATOCRIT: 28.3 % — AB (ref 39.0–52.0)
Hemoglobin: 9.4 g/dL — ABNORMAL LOW (ref 13.0–17.0)
MCH: 28.5 pg (ref 26.0–34.0)
MCHC: 33.2 g/dL (ref 30.0–36.0)
MCV: 85.8 fL (ref 78.0–100.0)
Platelets: 257 10*3/uL (ref 150–400)
RBC: 3.3 MIL/uL — ABNORMAL LOW (ref 4.22–5.81)
RDW: 14.4 % (ref 11.5–15.5)
WBC: 8 10*3/uL (ref 4.0–10.5)

## 2015-08-29 LAB — BASIC METABOLIC PANEL
Anion gap: 7 (ref 5–15)
BUN: 11 mg/dL (ref 6–20)
CALCIUM: 8.1 mg/dL — AB (ref 8.9–10.3)
CO2: 26 mmol/L (ref 22–32)
CREATININE: 0.8 mg/dL (ref 0.61–1.24)
Chloride: 106 mmol/L (ref 101–111)
GFR calc non Af Amer: >60 mL/min (ref >60)
Glucose, Bld: 186 mg/dL — ABNORMAL HIGH (ref 65–99)
Potassium: 3.6 mmol/L (ref 3.5–5.1)
Sodium: 139 mmol/L (ref 135–145)

## 2015-08-29 LAB — GLUCOSE, CAPILLARY
GLUCOSE-CAPILLARY: 168 mg/dL — AB (ref 65–99)
GLUCOSE-CAPILLARY: 162 mg/dL — AB (ref 65–99)
GLUCOSE-CAPILLARY: 290 mg/dL — AB (ref 65–99)
Glucose-Capillary: 128 mg/dL — ABNORMAL HIGH (ref 65–99)

## 2015-08-29 LAB — C DIFFICILE QUICK SCREEN W PCR REFLEX
C DIFFICLE (CDIFF) ANTIGEN: NEGATIVE
C Diff interpretation: NEGATIVE
C Diff toxin: NEGATIVE

## 2015-08-29 MED ORDER — DOXYCYCLINE HYCLATE 100 MG PO TABS
100.0000 mg | ORAL_TABLET | Freq: Two times a day (BID) | ORAL | Status: DC
  Administered 2015-08-29 – 2015-08-31 (×5): 100 mg via ORAL
  Filled 2015-08-29 (×5): qty 1

## 2015-08-29 MED ORDER — SACCHAROMYCES BOULARDII 250 MG PO CAPS
250.0000 mg | ORAL_CAPSULE | Freq: Two times a day (BID) | ORAL | Status: DC
  Administered 2015-08-29 – 2015-08-31 (×5): 250 mg via ORAL
  Filled 2015-08-29 (×5): qty 1

## 2015-08-29 MED ORDER — FAMOTIDINE 20 MG PO TABS
20.0000 mg | ORAL_TABLET | Freq: Two times a day (BID) | ORAL | Status: DC
  Administered 2015-08-29 – 2015-08-31 (×4): 20 mg via ORAL
  Filled 2015-08-29 (×4): qty 1

## 2015-08-29 NOTE — Progress Notes (Signed)
Key Points: Use following P&T approved IV to PO non-antibiotic change policy.  Description contains the criteria that are approved Note: Policy Excludes:  Esophagectomy patientsPHARMACIST - PHYSICIAN COMMUNICATION DR:   Doyle Askew CONCERNING: IV to Oral Route Change Policy  RECOMMENDATION: This patient is receiving Pepcid by the intravenous route.  Based on criteria approved by the Pharmacy and Therapeutics Committee, the intravenous medication(s) is/are being converted to the equivalent oral dose form(s).   DESCRIPTION: These criteria include:  The patient is eating (either orally or via tube) and/or has been taking other orally administered medications for a least 24 hours  The patient has no evidence of active gastrointestinal bleeding or impaired GI absorption (gastrectomy, short bowel, patient on TNA or NPO).  If you have questions about this conversion, please contact the Pharmacy Department  []   [phone] )  [facility] []   [phone] )  [facility]  []   [phone] )  [facility] [x]   [phone] )  [place], [place], [facility] 08/29/2015 11:27 AM

## 2015-08-29 NOTE — Progress Notes (Signed)
Occupational Therapy Treatment Patient Details Name: [name] MRN: [id number] DOB: 1933/06/26 Today's Date: 08/29/2015    History of present illness Pt is a 81 y.o. male with PMH of hypertension, bilateral knee replacements, hyperlipidemia, diabetes mellitus, GERD, diverticulosis, arthritis, DVT, IBS, diastolic congestive heart failure (EF 60-65% with grade 1 diastolic distortion), CAD, peripheral neuropathy, chronic neck pain, chronic knee pain, sleeping a lot at at normal baseline, who presents with generalized weakness, fever, abdominal pain, diarrhea   OT comments  Pt is making good progress in OT. Encouraged rest breaks; reviewed energy conservation and cues also given for safety with RW during session  Follow Up Recommendations  Home health OT;Supervision/Assistance - 24 hour    Equipment Recommendations  3 in 1 bedside comode    Recommendations for Other Services      Precautions / Restrictions Precautions Precautions: Fall Restrictions Weight Bearing Restrictions: No       Mobility Bed Mobility         Supine to sit: Supervision     General bed mobility comments: for safety; manage lines  Transfers     Transfers: Sit to/from Stand Sit to Stand: Min guard         General transfer comment: cues for UE placement.  Min guard for safety    Balance                                   ADL                           Toilet Transfer: Min guard;Ambulation;BSC   Toileting- Clothing Manipulation and Hygiene: Minimal assistance;Sit to/from stand         General ADL Comments: Pt ambulated to bathroom; cues to sidestep through tight space and cues for safety to back up to commode and chair completely prior to sitting down.  Min A given for thoroughness for hygiene:  pt has dressing on buttocks.  Pt washed hands and brushed teeth at sink at supervision level.  Pt states that his shower ledge is 1/2 the height of ours:  wants to wait  until next visit to practice this.  Reviewed energy conservation techniques.        Vision                     Perception     Praxis      Cognition   Behavior During Therapy: WFL for tasks assessed/performed Overall Cognitive Status: Within Functional Limits for tasks assessed                       Extremity/Trunk Assessment               Exercises     Shoulder Instructions       General Comments      Pertinent Vitals/ Pain       Pain Assessment: No/denies pain  Home Living                                          Prior Functioning/Environment              Frequency Min 2X/week     Progress Toward Goals  OT Goals(current goals can now be found in  the care plan section)  Progress towards OT goals: Progressing toward goals  Acute Rehab OT Goals Patient Stated Goal: to get stronger.  Plan      Co-evaluation                 End of Session     Activity Tolerance Patient tolerated treatment well   Patient Left in chair;with call bell/phone within reach;with family/visitor present;with chair alarm set   Nurse Communication          Time: [phone] OT Time Calculation (min): 30 min  Charges: OT General Charges $OT Visit: 1 Procedure OT Treatments $Self Care/Home Management : 23-37 mins  SPENCER,MARYELLEN 08/29/2015, 9:37 AM Lesle Chris, OTR/L [phone] 08/29/2015

## 2015-08-29 NOTE — Progress Notes (Signed)
Patient ID: [name], male   DOB: 25-Oct-1933, 81 y.o.   MRN: [id number]  [facility] PROGRESS NOTE  [name] [id number]:[id number] DOB: 06-30-1933 DOA: 08/26/2015 PCP: [name]., DO   Brief narrative:    81 y.o. male with hypertension, hyperlipidemia, diabetes mellitus, GERD, diverticulosis, arthritis, DVT, IBS, diastolic congestive heart failure (EF 60-65% with grade 1 diastolic distortion), CAD, peripheral neuropathy, presented with generalized weakness, fever, abdominal pain, diarrhea several weeks in duration.   In ED, patient was found to have WBC 14.3, fever with temperature 103.3, lactate 1.24, troponin negative, negative urinalysis for UTI, electrolytes okay, renal function okay, negative chest x-ray for acute abnormalities. Patient's admitted to inpatient for further evaluation and treatment.   Assessment/Plan:    Principal Problem:   Sepsis secondary to LLL PNA - pt met sepsis criteria on admission - PNA is the source, no fevers over the past 24 hours  - completed three days of vanc and zosyn, narrow down to doxycycline today  - imaging studies reviewed with pt and family - CT abd was not very specific for colitis as initially suspected but current abx should be adequate in that case as well  - spent extensive time educating pt on use of IS 9/17, importance of deep breaths, relaxation techniques - pt demonstrated knowledge of IS use, inspiratory capacity initially up to 1500 cc but now > 2000 - continue with IS use    Active Problems:   Left lower lobe pneumonia - continue ABX as noted above, changed to doxycycline 9/18     Uncontrolled type 2 diabetes mellitus with peripheral circulatory disorder - Last A1c 7.7 on 03/16/15, not well controled. Patient is taking metformin and Levemir at home - decreased Lantus dose from 20 units to 14 units daily and seems to be sufficient for now - SSI also provided as indicated     Essential hypertension - SBP 120's this  AM - continue to hold home amlodipine and lasix since patient is at risk of developing hypotension - When necessary hydralazine IV is OK - Patient is on Cardura which is for stress incontinence.     Chronic diastolic congestive heart failure - 2-D echo on 08/26/14 showed EF 60-65% with grade 1 diastolic dysfunction. Patient is on Lasix 40 mg prn at home. - CHF is compensated at this time  - may resume Lasix in am as per home medical regimen     OAB (overactive bladder) - continue Cardura     Diarrhea - possibly related to PNA vs undelrying colitis but this was not very impressive on CT abd  - stool studies still pending, stopped vancomycin and zosyn today and transition to oral doxycycline - place on florastor - follow up on C. Diff     Hyperlipemia - Last LDL was 43 on 03/26/15 - Continue home medication Pravastatin    GERD - switched PPI to pepcid IV until C diff pcr negative      CAD (coronary artery disease - no chest pain. - continue ASA  DVT prophylaxis - Lovenox SQ  Code Status: Full.  Family Communication:  plan of care discussed with the patient and wife at bedside  Disposition Plan: Home in AM  IV access:  Peripheral IV  Procedures and diagnostic studies:    Dg Chest 2 View 08/26/2015  Enlarged cardiac silhouette, otherwise no radiographic evidence of acute cardiopulmonary abnormality.    Ct Abdomen Pelvis W Contrast 08/26/2015  Left lower lobe pneumonia. Clinical correlation and  follow-up resolution recommended.  Diverticulosis.  Underdistention versus less likely segmental colitis of the sigmoid colon. Clinical correlation recommended.  Cholelithiasis.  Right renal cyst.     Medical Consultants:  None   Other Consultants:  None  IAnti-Infectives:   Vancomycin 9/16 --> 9/18 Zosyn 9/16 --> 9/18  Doxycycline 9/18 -->  [name], MD  [facility] Pager [phone]  If 7PM-7AM, please contact night-coverage [email or web address] Password [email or web address] 08/29/2015, 7:10  AM   LOS: 3 days   HPI/Subjective: No events overnight. Still with productive cough   Objective: Filed Vitals:   08/28/15 1435 08/28/15 2110 08/29/15 0503 08/29/15 0506  BP: 132/68 112/55 129/68   Pulse: 68 58 56   Temp: 98 F (36.7 C) 98.3 F (36.8 C) 98.3 F (36.8 C)   TempSrc: Oral Oral Oral   Resp: _0 Height:      Weight:    88.7 kg (195 lb 8.8 oz)  SpO2: 95% 95% 95%     Intake/Output Summary (Last 24 hours) at 08/29/15 0710 Last data filed at 08/29/15 0600  Gross per 24 hour  Intake   1130 ml  Output   1450 ml  Net   -320 ml    Exam:   General:  Pt is alert, follows commands appropriately, not in acute distress  Cardiovascular: Regular rate and rhythm, S1/S2, no murmurs, no rubs, no gallops  Respiratory: Clear to auscultation bilaterally, no wheezing, rhonchi at the LLL remain with diminished breath sounds  Abdomen: Soft, non tender, non distended, bowel sounds present, no guarding  Extremities: No edema, pulses DP and PT palpable bilaterally  Neuro: Grossly nonfocal  Data Reviewed: Basic Metabolic Panel:  Recent Labs Lab 08/26/15 1636 08/27/15 0505 08/28/15 0504 08/29/15 0513  NA 134* 134* 135 139  K 3.6 3.6 3.6 3.6  CL 100* 102 104 106  CO2 _1 GLUCOSE 110* 160* 152* 186*  BUN 23* _2 CREATININE 1.03 0.81 0.74 0.80  CALCIUM 8.5* 8.1* 8.0* 8.1*   Liver Function Tests:  Recent Labs Lab 08/26/15 1636 08/27/15 0505  AST 81* 67*  ALT 57 50  ALKPHOS 51 50  BILITOT 1.0 0.9  PROT 7.4 6.3*  ALBUMIN 3.3* 2.6*    Recent Labs Lab 08/26/15 2352  LIPASE 12*   No results for input(s): AMMONIA in the last 168 hours. CBC:  Recent Labs Lab 08/26/15 1636 08/27/15 0505 08/28/15 0504 08/29/15 0513  WBC 14.1* 10.3 10.7* 8.0  NEUTROABS 11.4*  --   --   --   HGB 10.1* 10.1* 9.4* 9.4*  HCT 30.3* 30.3* 28.7* 28.3*  MCV 85.6 87.1 85.7 85.8  PLT 242 243 236 257   Cardiac Enzymes: No results for input(s): CKTOTAL,  CKMB, CKMBINDEX, TROPONINI in the last 168 hours. BNP: Invalid input(s): POCBNP CBG:  Recent Labs Lab 08/27/15 2204 08/28/15 0842 08/28/15 1147 08/28/15 1638 08/28/15 2240  GLUCAP 179* 168* 205* 151* 224*    Recent Results (from the past 240 hour(s))  Blood Culture (routine x 2)     Status: None (Preliminary result)   Collection Time: 08/26/15  4:29 PM  Result Value Ref Range Status   Specimen Description BLOOD LEFT FOREARM  Final   Special Requests BOTTLES DRAWN AEROBIC AND ANAEROBIC 5CC  Final   Culture   Final    NO GROWTH 2 DAYS Performed at [facility]    Report Status PENDING  Incomplete  Blood Culture (routine x 2)  Status: None (Preliminary result)   Collection Time: 08/26/15  4:32 PM  Result Value Ref Range Status   Specimen Description BLOOD RIGHT FOREARM  Final   Special Requests BOTTLES DRAWN AEROBIC AND ANAEROBIC 5CC  Final   Culture   Final    NO GROWTH 2 DAYS Performed at [facility]    Report Status PENDING  Incomplete  Urine culture     Status: None   Collection Time: 08/26/15  5:50 PM  Result Value Ref Range Status   Specimen Description URINE, CATHETERIZED  Final   Special Requests NONE  Final   Culture   Final    NO GROWTH 2 DAYS Performed at [facility]    Report Status 08/28/2015 FINAL  Final  Culture, sputum-assessment     Status: None   Collection Time: 08/27/15  6:21 AM  Result Value Ref Range Status   Specimen Description SPUTUM  Final   Special Requests Normal  Final   Sputum evaluation   Final    MICROSCOPIC FINDINGS SUGGEST THAT THIS SPECIMEN IS NOT REPRESENTATIVE OF LOWER RESPIRATORY SECRETIONS. PLEASE RECOLLECT. NOTIFIED J. SCOTTON RN AT 5277 ON 09.16.16 BY SHUEA    Report Status 08/27/2015 FINAL  Final  Culture, expectorated sputum-assessment     Status: None   Collection Time: 08/28/15  6:04 AM  Result Value Ref Range Status   Specimen Description SPUTUM  Final   Special Requests Immunocompromised   Final   Sputum evaluation   Final    THIS SPECIMEN IS ACCEPTABLE. RESPIRATORY CULTURE REPORT TO FOLLOW.   Report Status 08/28/2015 FINAL  Final     Scheduled Meds: . aspirin  81 mg Oral Daily  . cholecalciferol  400 Units Oral Daily  . dextromethorphan-guaiFEN  1 tablet Oral BID  . Difluprednate  1 drop Ophthalmic BID  . doxazosin  4 mg Oral Daily  . enoxaparin  injection  40 mg Subcutaneous Q24H  . famotidine IV  20 mg Intravenous Q12H  . gatifloxacin  1 drop Both Eyes QID  . insulin aspart  0-9 Units Subcutaneous TID WC  . insulin detemir  14 Units Subcutaneous Q2200  . ketorolac  1 drop Both Eyes QID  . piperacillin-tazobactam (ZOSYN)  IV  3.375 g Intravenous Q8H  . pravastatin  40 mg Oral QPM  . vancomycin  1,000 mg Intravenous Q12H   Continuous Infusions:

## 2015-08-30 LAB — CULTURE, RESPIRATORY

## 2015-08-30 LAB — GLUCOSE, CAPILLARY
GLUCOSE-CAPILLARY: 203 mg/dL — AB (ref 65–99)
GLUCOSE-CAPILLARY: 256 mg/dL — AB (ref 65–99)
Glucose-Capillary: 143 mg/dL — ABNORMAL HIGH (ref 65–99)
Glucose-Capillary: 162 mg/dL — ABNORMAL HIGH (ref 65–99)

## 2015-08-30 LAB — LEGIONELLA ANTIGEN, URINE

## 2015-08-30 LAB — CULTURE, RESPIRATORY W GRAM STAIN

## 2015-08-30 MED ORDER — FUROSEMIDE 40 MG PO TABS: 40.0000 mg | ORAL_TABLET | Freq: Every day | ORAL | Status: DC | PRN

## 2015-08-30 NOTE — Care Management Note (Signed)
Case Management Note  Patient Details  Name: PARTICK MUSSELMAN MRN: [id number] Date of Birth: 1933/03/02  Subjective/Objective:  [facility] chosen for HHPT. [facility] rep Kristen aware of order.                   Action/Plan:d/c home w/HHC.   Expected Discharge Date:   (unknown)               Expected Discharge Plan:  [place]  In-House Referral:     Discharge planning Services  CM Consult  Post Acute Care Choice:    Choice offered to:  Patient  DME Arranged:    DME Agency:     HH Arranged:  PT [place]:  [place]  Status of Service:  Completed, signed off  Medicare Important Message Given:  Yes-second notification given Date Medicare IM Given:    Medicare IM give by:    Date Additional Medicare IM Given:    Additional Medicare Important Message give by:     If discussed at [place] of Stay Meetings, dates discussed:    Additional Comments:  Dessa Phi, RN 08/30/2015, 12:02 PM

## 2015-08-30 NOTE — Progress Notes (Signed)
Patient ID: [name], male   DOB: Sep 08, 1933, 81 y.o.   MRN: [id number]  [facility] PROGRESS NOTE  [name] GBE:[id number] DOB: 11/30/1933 DOA: 08/26/2015 PCP: Bobby Kern., DO   Brief narrative:    81 y.o. male with hypertension, hyperlipidemia, diabetes mellitus, GERD, diverticulosis, arthritis, DVT, IBS, diastolic congestive heart failure (EF 60-65% with grade 1 diastolic distortion), CAD, peripheral neuropathy, presented with generalized weakness, fever, abdominal pain, diarrhea several weeks in duration.   In ED, patient was found to have WBC 14.3, fever with temperature 103.3, lactate 1.24, troponin negative, negative urinalysis for UTI, electrolytes okay, renal function okay, negative chest x-ray for acute abnormalities. Patient's admitted to inpatient for further evaluation and treatment.   Assessment/Plan:    Principal Problem:   Sepsis secondary to LLL PNA - pt met sepsis criteria on admission - PNA is the source, no fevers over the past 48 hours  - completed three days of vanc and zosyn, has been transitioned to oral doxycycline 9/18, continue same regimen for now - imaging studies reviewed with pt and family - CT abd was not very specific for colitis as initially suspected but current abx should be adequate in that case as well  - spent extensive time educating pt on use of IS 9/17, importance of deep breaths, relaxation techniques - pt demonstrated knowledge of IS use, inspiratory capacity initially up to 1500 cc but now remains > 2000 cc - continue with IS use    Active Problems:   Left lower lobe pneumonia - continue ABX as noted above, changed to doxycycline 9/18     Uncontrolled type 2 diabetes mellitus with peripheral circulatory disorder - Last A1c 7.7 on 03/16/15, not well controled. Patient is taking metformin and Levemir at home - continue 14 units daily and seems to be sufficient for now - SSI also provided as indicated     Essential  hypertension - SBP 120's this AM - continue to hold home amlodipine and lasix since patient is at risk of developing hypotension - When necessary hydralazine IV is OK - Patient is on Cardura which is for stress incontinence.     Chronic diastolic congestive heart failure - 2-D echo on 08/26/14 showed EF 60-65% with grade 1 diastolic dysfunction. Patient is on Lasix 40 mg prn at home. - CHF is compensated at this time  - resume lasix per home medical regimen     OAB (overactive bladder) - continue Cardura     Diarrhea - possibly related to PNA vs undelrying colitis but this was not very impressive on CT abd  - C. Diff by PCR negative - continue Florastor, stool panel pending     Hyperlipemia - Last LDL was 43 on 03/26/15 - Continue home medication Pravastatin    GERD - switched PPI to pepcid IV until C diff pcr negative      CAD (coronary artery disease - no chest pain. - continue ASA  DVT prophylaxis - Lovenox SQ  Code Status: Full.  Family Communication:  plan of care discussed with the patient and wife at bedside  Disposition Plan: Home in AM  IV access:  Peripheral IV  Procedures and diagnostic studies:    Dg Chest 2 View 08/26/2015  Enlarged cardiac silhouette, otherwise no radiographic evidence of acute cardiopulmonary abnormality.    Ct Abdomen Pelvis W Contrast 08/26/2015  Left lower lobe pneumonia. Clinical correlation and follow-up resolution recommended.  Diverticulosis.  Underdistention versus less likely segmental colitis of the sigmoid  colon. Clinical correlation recommended.  Cholelithiasis.  Right renal cyst.     Medical Consultants:  None   Other Consultants:  None  IAnti-Infectives:   Vancomycin 9/16 --> 9/18 Zosyn 9/16 --> 9/18  Doxycycline 9/18 -->  [name], MD  [facility] Pager [phone]  If 7PM-7AM, please contact night-coverage [email or web address] Password [facility] 08/30/2015, 10:52 AM   LOS: 4 days   HPI/Subjective: No events overnight.  Still with productive cough but better overall   Objective: Filed Vitals:   08/29/15 1530 08/29/15 1953 08/30/15 0500 08/30/15 0626  BP: 129/59 133/62  127/60  Pulse: 58 65  63  Temp: 98.3 F (36.8 C) 98.2 F (36.8 C)  98 F (36.7 C)  TempSrc: Oral Oral  Oral  Resp: 20 20  20   Height:      Weight:   88.905 kg (196 lb)   SpO2: 95% 96%  93%    Intake/Output Summary (Last 24 hours) at 08/30/15 1052 Last data filed at 08/30/15 0800  Gross per 24 hour  Intake    340 ml  Output    900 ml  Net   -560 ml    Exam:   General:  Pt is alert, follows commands appropriately, not in acute distress  Cardiovascular: Regular rate and rhythm, S1/S2, no murmurs, no rubs, no gallops  Respiratory: Clear to auscultation bilaterally, no wheezing, rhonchi at the LLL remain with diminished breath sounds  Abdomen: Soft, non tender, non distended, bowel sounds present, no guarding  Extremities: No edema, pulses DP and PT palpable bilaterally  Neuro: Grossly nonfocal  Data Reviewed: Basic Metabolic Panel:  Recent Labs Lab 08/26/15 1636 08/27/15 0505 08/28/15 0504 08/29/15 0513  NA 134* 134* 135 139  K 3.6 3.6 3.6 3.6  CL 100* 102 104 106  CO2 22 23 25 26   GLUCOSE 110* 160* 152* 186*  BUN 23* 17 12 11   CREATININE 1.03 0.81 0.74 0.80  CALCIUM 8.5* 8.1* 8.0* 8.1*   Liver Function Tests:  Recent Labs Lab 08/26/15 1636 08/27/15 0505  AST 81* 67*  ALT 57 50  ALKPHOS 51 50  BILITOT 1.0 0.9  PROT 7.4 6.3*  ALBUMIN 3.3* 2.6*    Recent Labs Lab 08/26/15 2352  LIPASE 12*   No results for input(s): AMMONIA in the last 168 hours. CBC:  Recent Labs Lab 08/26/15 1636 08/27/15 0505 08/28/15 0504 08/29/15 0513  WBC 14.1* 10.3 10.7* 8.0  NEUTROABS 11.4*  --   --   --   HGB 10.1* 10.1* 9.4* 9.4*  HCT 30.3* 30.3* 28.7* 28.3*  MCV 85.6 87.1 85.7 85.8  PLT 242 243 236 257   Cardiac Enzymes: No results for input(s): CKTOTAL, CKMB, CKMBINDEX, TROPONINI in the last 168  hours. BNP: Invalid input(s): POCBNP CBG:  Recent Labs Lab 08/29/15 0759 08/29/15 1153 08/29/15 1628 08/29/15 2149 08/30/15 0724  GLUCAP 128* 168* 162* 290* 203*    Recent Results (from the past 240 hour(s))  Blood Culture (routine x 2)     Status: None (Preliminary result)   Collection Time: 08/26/15  4:29 PM  Result Value Ref Range Status   Specimen Description BLOOD LEFT FOREARM  Final   Special Requests BOTTLES DRAWN AEROBIC AND ANAEROBIC 5CC  Final   Culture   Final    NO GROWTH 3 DAYS Performed at [facility]    Report Status PENDING  Incomplete  Blood Culture (routine x 2)     Status: None (Preliminary result)   Collection Time: 08/26/15  4:32 PM  Result Value Ref Range Status   Specimen Description BLOOD RIGHT FOREARM  Final   Special Requests BOTTLES DRAWN AEROBIC AND ANAEROBIC 5CC  Final   Culture   Final    NO GROWTH 3 DAYS Performed at [facility]    Report Status PENDING  Incomplete  Urine culture     Status: None   Collection Time: 08/26/15  5:50 PM  Result Value Ref Range Status   Specimen Description URINE, CATHETERIZED  Final   Special Requests NONE  Final   Culture   Final    NO GROWTH 2 DAYS Performed at [facility]    Report Status 08/28/2015 FINAL  Final  Stool culture     Status: None (Preliminary result)   Collection Time: 08/27/15  6:21 AM  Result Value Ref Range Status   Specimen Description STOOL  Final   Special Requests NONE  Final   Culture   Final    NO SUSPICIOUS COLONIES, CONTINUING TO HOLD Performed at [facility]    Report Status PENDING  Incomplete  Culture, sputum-assessment     Status: None   Collection Time: 08/27/15  6:21 AM  Result Value Ref Range Status   Specimen Description SPUTUM  Final   Special Requests Normal  Final   Sputum evaluation   Final    MICROSCOPIC FINDINGS SUGGEST THAT THIS SPECIMEN IS NOT REPRESENTATIVE OF LOWER RESPIRATORY SECRETIONS. PLEASE RECOLLECT. NOTIFIED  J. SCOTTON RN AT [phone] ON 09.16.16 BY SHUEA    Report Status 08/27/2015 FINAL  Final  Culture, expectorated sputum-assessment     Status: None   Collection Time: 08/28/15  6:04 AM  Result Value Ref Range Status   Specimen Description SPUTUM  Final   Special Requests Immunocompromised  Final   Sputum evaluation   Final    THIS SPECIMEN IS ACCEPTABLE. RESPIRATORY CULTURE REPORT TO FOLLOW.   Report Status 08/28/2015 FINAL  Final  Culture, respiratory (NON-Expectorated)     Status: None   Collection Time: 08/28/15  6:04 AM  Result Value Ref Range Status   Specimen Description SPUTUM  Final   Special Requests NONE  Final   Gram Stain   Final    MODERATE WBC PRESENT, PREDOMINANTLY PMN MODERATE SQUAMOUS EPITHELIAL CELLS PRESENT FEW YEAST RARE GRAM NEGATIVE RODS Performed at [facility]    Culture   Final    FEW CANDIDA ALBICANS Performed at [facility]    Report Status 08/30/2015 FINAL  Final  C difficile quick scan w PCR reflex     Status: None   Collection Time: 08/29/15 12:00 PM  Result Value Ref Range Status   C Diff antigen NEGATIVE NEGATIVE Final   C Diff toxin NEGATIVE NEGATIVE Final   C Diff interpretation Negative for toxigenic C. difficile  Final     Scheduled Meds: . aspirin  81 mg Oral Daily  . cholecalciferol  400 Units Oral Daily  . dextromethorphan-guaiFEN  1 tablet Oral BID  . Difluprednate  1 drop Ophthalmic BID  . doxazosin  4 mg Oral Daily  . enoxaparin  injection  40 mg Subcutaneous Q24H  . famotidine IV  20 mg Intravenous Q12H  . gatifloxacin  1 drop Both Eyes QID  . insulin aspart  0-9 Units Subcutaneous TID WC  . insulin detemir  14 Units Subcutaneous Q2200  . ketorolac  1 drop Both Eyes QID  . piperacillin-tazobactam (ZOSYN)  IV  3.375 g Intravenous Q8H  . pravastatin  40 mg Oral QPM  . vancomycin  1,000 mg Intravenous Q12H   Continuous Infusions:

## 2015-08-30 NOTE — Progress Notes (Signed)
PT Cancellation Note  Patient Details Name: [name] MRN: [id number] DOB: May 20, 1933   Cancelled Treatment:    Reason Eval/Treat Not Completed: MD discontinued PT order-unsure of reason. Made RN aware. Continue to recommend HHPT at discharge.    Weston Anna, MPT Pager: [phone]

## 2015-08-30 NOTE — Care Management Important Message (Signed)
Important Message  Patient Details IM Letter given to Kathy/Case Manager to present to Patient.Important Message  Patient Details  Name: [name] MRN: [id number] Date of Birth: 1933/09/22   Medicare Important Message Given:  Yes-second notification given    Camillo Flaming 08/30/2015, 11:01 AM Name: [name] MRN: [id number] Date of Birth: 29-Sep-1933   Medicare Important Message Given:  Yes-second notification given    Camillo Flaming 08/30/2015, 11:01 AM

## 2015-08-31 LAB — CBC
HCT: 31.1 % — ABNORMAL LOW (ref 39.0–52.0)
HEMOGLOBIN: 9.8 g/dL — AB (ref 13.0–17.0)
MCH: 27.5 pg (ref 26.0–34.0)
MCHC: 31.5 g/dL (ref 30.0–36.0)
MCV: 87.4 fL (ref 78.0–100.0)
Platelets: 367 10*3/uL (ref 150–400)
RBC: 3.56 MIL/uL — ABNORMAL LOW (ref 4.22–5.81)
RDW: 14.7 % (ref 11.5–15.5)
WBC: 8.7 10*3/uL (ref 4.0–10.5)

## 2015-08-31 LAB — CULTURE, BLOOD (ROUTINE X 2)
CULTURE: NO GROWTH
CULTURE: NO GROWTH

## 2015-08-31 LAB — BASIC METABOLIC PANEL
Anion gap: 10 (ref 5–15)
BUN: 12 mg/dL (ref 6–20)
CALCIUM: 8.5 mg/dL — AB (ref 8.9–10.3)
CHLORIDE: 103 mmol/L (ref 101–111)
CO2: 27 mmol/L (ref 22–32)
CREATININE: 0.71 mg/dL (ref 0.61–1.24)
GFR calc non Af Amer: >60 mL/min (ref >60)
Glucose, Bld: 126 mg/dL — ABNORMAL HIGH (ref 65–99)
Potassium: 3.9 mmol/L (ref 3.5–5.1)
SODIUM: 140 mmol/L (ref 135–145)

## 2015-08-31 LAB — GLUCOSE, CAPILLARY
GLUCOSE-CAPILLARY: 144 mg/dL — AB (ref 65–99)
Glucose-Capillary: 203 mg/dL — ABNORMAL HIGH (ref 65–99)

## 2015-08-31 MED ORDER — DOXYCYCLINE HYCLATE 100 MG PO TABS: 100.0000 mg | ORAL_TABLET | Freq: Two times a day (BID) | ORAL | Status: DC

## 2015-08-31 MED ORDER — SACCHAROMYCES BOULARDII 250 MG PO CAPS: 250.0000 mg | ORAL_CAPSULE | Freq: Two times a day (BID) | ORAL | Status: DC

## 2015-08-31 MED ORDER — ALBUTEROL SULFATE (2.5 MG/3ML) 0.083% IN NEBU: 2.5000 mg | INHALATION_SOLUTION | RESPIRATORY_TRACT | Status: DC | PRN

## 2015-08-31 NOTE — Discharge Instructions (Signed)

## 2015-08-31 NOTE — Progress Notes (Signed)
Occupational Therapy Treatment Patient Details Name: [name] MRN: [id number] DOB: [birth year] Today's Date: 08/31/2015    History of present illness Pt is a 81 y.o. male with PMH of hypertension, bilateral knee replacements, hyperlipidemia, diabetes mellitus, GERD, diverticulosis, arthritis, DVT, IBS, diastolic congestive heart failure (EF 60-65% with grade 1 diastolic distortion), CAD, peripheral neuropathy, chronic neck pain, chronic knee pain, sleeping a lot at at normal baseline, who presents with generalized weakness, fever, abdominal pain, diarrhea   OT comments  Practiced shower transfer with pt and reinforced energy conservation education. Asked pt and wife about 3in1 but they state they do have a higher commode with grab bar and state they feel they will be fine with this. Pt doing well.     Follow Up Recommendations  Supervision/Assistance - 24 hour;Home health OT    Equipment Recommendations  None recommended by OT (pt states he has a higher commode with grab bar.)    Recommendations for Other Services      Precautions / Restrictions Precautions Precautions: Fall Restrictions Weight Bearing Restrictions: No       Mobility Bed Mobility               General bed mobility comments: in chair.   Transfers Overall transfer level: Needs assistance Equipment used: Rolling walker (2 wheeled) Transfers: Sit to/from Stand Sit to Stand: Min guard         General transfer comment: min guard for safety.     Balance                                   ADL                                   Tub/ Shower Transfer: Walk-in shower;Min guard;Grab bars;Rolling walker     General ADL Comments: Educated on shower transfer safety and demonstrated how to transfer into shower stepping backwards over ledge using walker to hold to. Pt then turned around to face shower head using grab bar. Wife verbalizes understanding of how to hold walker  steady. Discussed use of shower seat for energy conservation and discussed other energy conservation strategies including breaking down tasks into small steps and resting as needed. Nursing student going into room and stated she would hand pt the energy conseravtion handout.       Vision                     Perception     Praxis      Cognition   Behavior During Therapy: WFL for tasks assessed/performed Overall Cognitive Status: Within Functional Limits for tasks assessed                       Extremity/Trunk Assessment               Exercises     Shoulder Instructions       General Comments      Pertinent Vitals/ Pain       Pain Assessment: No/denies pain  Home Living                                          Prior Functioning/Environment  Frequency Min 2X/week     Progress Toward Goals  OT Goals(current goals can now be found in the care plan section)  Progress towards OT goals: Progressing toward goals     Plan Discharge plan remains appropriate    Co-evaluation                 End of Session Equipment Utilized During Treatment: Rolling walker   Activity Tolerance Patient tolerated treatment well   Patient Left in chair;with call bell/phone within reach;with family/visitor present   Nurse Communication          Time: 0950-1008 OT Time Calculation (min): 18 min  Charges: OT General Charges $OT Visit: 1 Procedure OT Treatments $Therapeutic Activity: 8-22 mins  Jules Schick  [phone] 08/31/2015, 10:19 AM

## 2015-08-31 NOTE — Discharge Summary (Signed)
Physician Discharge Summary  [name] MRN:[id number] DOB: 07/14/1933 DOA: 08/26/2015  PCP: KIM, HANNAH R., DO  Admit date: 08/26/2015 Discharge date: 08/31/2015  Recommendations for Outpatient Follow-up:  1. Pt will need to follow up with PCP in 2-3 weeks post discharge 2. Please obtain BMP to evaluate electrolytes and kidney function 3. Please also check CBC to evaluate Hg and Hct levels 4. Pt to continue taking oral doxycycline upon discharge to complete therapy   Discharge Diagnoses:  Principal Problem:   Sepsis Active Problems:   Abdominal pain   Left lower lobe pneumonia   Uncontrolled type 2 diabetes mellitus with peripheral circulatory disorder   Essential hypertension   Chronic diastolic congestive heart failure   OAB (overactive bladder)   Diarrhea   Hyperlipemia   Diabetic peripheral neuropathy associated with type 2 diabetes mellitus   GERD   CAD (coronary artery disease)   Discharge Condition: Stable  Diet recommendation: Heart healthy diet discussed in details    Brief narrative:    81 y.o. male with hypertension, hyperlipidemia, diabetes mellitus, GERD, diverticulosis, arthritis, DVT, IBS, diastolic congestive heart failure (EF 60-65% with grade 1 diastolic distortion), CAD, peripheral neuropathy, presented with generalized weakness, fever, abdominal pain, diarrhea several weeks in duration.   In ED, patient was found to have WBC 14.3, fever with temperature 103.3, lactate 1.24, troponin negative, negative urinalysis for UTI, electrolytes okay, renal function okay, negative chest x-ray for acute abnormalities. Patient's admitted to inpatient for further evaluation and treatment.   Assessment/Plan:    Principal Problem:  Sepsis secondary to LLL PNA - pt met sepsis criteria on admission - PNA is the source, no fevers over the past 48 hours  - completed three days of vanc and zosyn, has been transitioned to oral doxycycline 9/18, continue same  regimen for now upon discharge  - imaging studies reviewed with pt and family - CT abd was not very specific for colitis as initially suspected but current abx should be adequate in that case as well  - spent extensive time educating pt on use of IS 9/17, importance of deep breaths, relaxation techniques - pt demonstrated knowledge of IS use, inspiratory capacity initially up to 1500 cc but now remains > 2000 cc - continue with IS use   Active Problems:  Left lower lobe pneumonia - continue ABX as noted above, changed to doxycycline 9/18 and pt to continue taking upon discharge to complete therapy    Uncontrolled type 2 diabetes mellitus with peripheral circulatory disorder - Last A1c 7.7 on 03/16/15, not well controled. Patient is taking metformin and Levemir at home - continue home regimen upon discharge    Essential hypertension - resume home medical regimen  \  Chronic diastolic congestive heart failure - 2-D echo on 08/26/14 showed EF 60-65% with grade 1 diastolic dysfunction. Patient is on Lasix 40 mg prn at home. - CHF is compensated at this time  - resume lasix per home medical regimen    OAB (overactive bladder) - continue Cardura    Diarrhea - possibly related to PNA vs undelrying colitis but this was not very impressive on CT abd  - C. Diff by PCR negative - continue Florastor - resolved    Hyperlipemia - Last LDL was 43 on 03/26/15 - Continue home medication Pravastatin   GERD - switched PPI to pepcid IV until C diff pcr negative    CAD (coronary artery disease - no chest pain. - continue ASA  Code Status: Full.  Family   Communication: plan of care discussed with the patient and wife at bedside  Disposition Plan: Home  IV access:  Peripheral IV  Procedures and diagnostic studies:   Dg Chest 2 View 08/26/2015 Enlarged cardiac silhouette, otherwise no radiographic evidence of acute cardiopulmonary abnormality.   Ct Abdomen Pelvis W  Contrast 08/26/2015 Left lower lobe pneumonia. Clinical correlation and follow-up resolution recommended. Diverticulosis. Underdistention versus less likely segmental colitis of the sigmoid colon. Clinical correlation recommended. Cholelithiasis. Right renal cyst.   Medical Consultants:  None   Other Consultants:  None  IAnti-Infectives:   Vancomycin 9/16 --> 9/18 Zosyn 9/16 --> 9/18  Doxycycline 9/18 -->     Discharge Exam: Filed Vitals:   08/31/15 0900  BP: 144/60  Pulse: 57  Temp: 98.3 F (36.8 C)  Resp: 20   Filed Vitals:   08/30/15 2037 08/31/15 0412 08/31/15 0528 08/31/15 0900  BP: 147/76 135/59  144/60  Pulse: 60 58  57  Temp: 98.6 F (37 C) 98.2 F (36.8 C)  98.3 F (36.8 C)  TempSrc: Oral Oral  Oral  Resp: _0 Height:      Weight:   88.8 kg (195 lb 12.3 oz)   SpO2: 97% 96%  97%    General: Pt is alert, follows commands appropriately, not in acute distress Cardiovascular: Regular rate and rhythm, S1/S2 +, no murmurs, no rubs, no gallops Respiratory: Clear to auscultation bilaterally, no wheezing, no crackles, no rhonchi Abdominal: Soft, non tender, non distended, bowel sounds +, no guarding Extremities: trace bilateral LE pitting edema, no cyanosis, pulses palpable bilaterally DP and PT   Discharge Instructions  Discharge Instructions    Diet - low sodium heart healthy    Complete by:  As directed      Increase activity slowly    Complete by:  As directed             Medication List    TAKE these medications        albuterol (2.5 MG/3ML) 0.083% nebulizer solution  Commonly known as:  PROVENTIL  Take 3 mLs (2.5 mg total) by nebulization every 4 (four) hours as needed for wheezing or shortness of breath.     amLODipine 5 MG tablet  Commonly known as:  NORVASC  Take 5 mg by mouth daily.     aspirin 81 MG tablet  Take 81 mg by mouth daily.     b complex vitamins tablet  Take 1 tablet by mouth daily.     BESIVANCE  0.6 % Susp  Generic drug:  Besifloxacin HCl  Place 1 drop into the right eye 3 (three) times daily. Stopping 08-31-15     CENTRUM SILVER PO  Take 1 tablet by mouth daily.     doxazosin 4 MG tablet  Commonly known as:  CARDURA  TAKE ONE TABLET BY MOUTH DAILY     doxycycline 100 MG tablet  Commonly known as:  VIBRA-TABS  Take 1 tablet (100 mg total) by mouth every 12 (twelve) hours.     DUREZOL 0.05 % Emul  Generic drug:  Difluprednate  Apply 1 drop to eye. BID for 1 week (9-13 to 9-20), QD for 1 week (9-20 to 9-27), then stop - RIGHT EYE QD for 1 week in LEFT EYE 9-14 to 09-01-15     furosemide 20 MG tablet  Commonly known as:  LASIX  Take 2 tablets (40 mg total) by mouth daily as needed.     insulin aspart 100 UNIT/ML  FlexPen  Commonly known as:  NOVOLOG FLEXPEN  Inject 8-12 Units into the skin 3 (three) times daily with meals.     Insulin Detemir 100 UNIT/ML Pen  Commonly known as:  LEVEMIR FLEXTOUCH  INJECT 20 UNITS INTO THE SKIN DAILY AT 10 PM.     metFORMIN 1000 MG tablet  Commonly known as:  GLUCOPHAGE  Take 1 tablet (1,000 mg total) by mouth 2 (two) times daily with a meal.     naproxen sodium 220 MG tablet  Commonly known as:  ANAPROX  Take 220 mg by mouth 2 (two) times daily with a meal.     omeprazole 20 MG capsule  Commonly known as:  PRILOSEC  TAKE 1 CAPSULE (20 MG TOTAL) BY MOUTH DAILY.     pravastatin 40 MG tablet  Commonly known as:  PRAVACHOL  Take 1 tablet (40 mg total) by mouth every evening.     PROLENSA 0.07 % Soln  Generic drug:  Bromfenac Sodium  Place 1 drop into both eyes every evening. Stop in left eye 09-07-15 right eye on 09-14-15     saccharomyces boulardii 250 MG capsule  Commonly known as:  FLORASTOR  Take 1 capsule (250 mg total) by mouth 2 (two) times daily.     VITAMIN D PO  Take 1 capsule by mouth daily.           Follow-up Information    Follow up with [facility].   Why:  HHPT/HHOT   Contact  information:   [place] [address] [phone]       Follow up with Colin Benton R., DO.   Specialty:  Family Medicine   Contact information:   [place] [address] [phone]       Call [name], MD.   Specialty:  Internal Medicine   Why:  As needed   Contact information:   [address] [place] [address] [phone]        The results of significant diagnostics from this hospitalization (including imaging, microbiology, ancillary and laboratory) are listed below for reference.     Microbiology: Recent Results (from the past 240 hour(s))  Blood Culture (routine x 2)     Status: None (Preliminary result)   Collection Time: 08/26/15  4:29 PM  Result Value Ref Range Status   Specimen Description BLOOD LEFT FOREARM  Final   Special Requests BOTTLES DRAWN AEROBIC AND ANAEROBIC 5CC  Final   Culture   Final    NO GROWTH 4 DAYS Performed at [facility]    Report Status PENDING  Incomplete  Blood Culture (routine x 2)     Status: None (Preliminary result)   Collection Time: 08/26/15  4:32 PM  Result Value Ref Range Status   Specimen Description BLOOD RIGHT FOREARM  Final   Special Requests BOTTLES DRAWN AEROBIC AND ANAEROBIC 5CC  Final   Culture   Final    NO GROWTH 4 DAYS Performed at [facility]    Report Status PENDING  Incomplete  Urine culture     Status: None   Collection Time: 08/26/15  5:50 PM  Result Value Ref Range Status   Specimen Description URINE, CATHETERIZED  Final   Special Requests NONE  Final   Culture   Final    NO GROWTH 2 DAYS Performed at [facility]    Report Status 08/28/2015 FINAL  Final  Stool culture  Status: None (Preliminary result)   Collection Time: 08/27/15  6:21 AM  Result Value Ref Range Status   Specimen Description STOOL  Final   Special Requests NONE  Final   Culture   Final    NO SUSPICIOUS COLONIES, CONTINUING TO  HOLD Performed at [facility]    Report Status PENDING  Incomplete  Culture, sputum-assessment     Status: None   Collection Time: 08/27/15  6:21 AM  Result Value Ref Range Status   Specimen Description SPUTUM  Final   Special Requests Normal  Final   Sputum evaluation   Final    MICROSCOPIC FINDINGS SUGGEST THAT THIS SPECIMEN IS NOT REPRESENTATIVE OF LOWER RESPIRATORY SECRETIONS. PLEASE RECOLLECT. NOTIFIED J. SCOTTON RN AT 0645 ON 09.16.16 BY SHUEA    Report Status 08/27/2015 FINAL  Final  Culture, expectorated sputum-assessment     Status: None   Collection Time: 08/28/15  6:04 AM  Result Value Ref Range Status   Specimen Description SPUTUM  Final   Special Requests Immunocompromised  Final   Sputum evaluation   Final    THIS SPECIMEN IS ACCEPTABLE. RESPIRATORY CULTURE REPORT TO FOLLOW.   Report Status 08/28/2015 FINAL  Final  Culture, respiratory (NON-Expectorated)     Status: None   Collection Time: 08/28/15  6:04 AM  Result Value Ref Range Status   Specimen Description SPUTUM  Final   Special Requests NONE  Final   Gram Stain   Final    MODERATE WBC PRESENT, PREDOMINANTLY PMN MODERATE SQUAMOUS EPITHELIAL CELLS PRESENT FEW YEAST RARE GRAM NEGATIVE RODS Performed at [facility]    Culture   Final    FEW CANDIDA ALBICANS Performed at [facility]    Report Status 08/30/2015 FINAL  Final  C difficile quick scan w PCR reflex     Status: None   Collection Time: 08/29/15 12:00 PM  Result Value Ref Range Status   C Diff antigen NEGATIVE NEGATIVE Final   C Diff toxin NEGATIVE NEGATIVE Final   C Diff interpretation Negative for toxigenic C. difficile  Final     Labs: Basic Metabolic Panel:  Recent Labs Lab 08/26/15 1636 08/27/15 0505 08/28/15 0504 08/29/15 0513 08/31/15 0546  NA 134* 134* 135 139 140  K 3.6 3.6 3.6 3.6 3.9  CL 100* 102 104 106 103  CO2 22 23 25 26 27  GLUCOSE 110* 160* 152* 186* 126*  BUN 23* 17 12 11 12  CREATININE  1.03 0.81 0.74 0.80 0.71  CALCIUM 8.5* 8.1* 8.0* 8.1* 8.5*   Liver Function Tests:  Recent Labs Lab 08/26/15 1636 08/27/15 0505  AST 81* 67*  ALT 57 50  ALKPHOS 51 50  BILITOT 1.0 0.9  PROT 7.4 6.3*  ALBUMIN 3.3* 2.6*    Recent Labs Lab 08/26/15 2352  LIPASE 12*   CBC:  Recent Labs Lab 08/26/15 1636 08/27/15 0505 08/28/15 0504 08/29/15 0513 08/31/15 0546  WBC 14.1* 10.3 10.7* 8.0 8.7  NEUTROABS 11.4*  --   --   --   --   HGB 10.1* 10.1* 9.4* 9.4* 9.8*  HCT 30.3* 30.3* 28.7* 28.3* 31.1*  MCV 85.6 87.1 85.7 85.8 87.4  PLT 242 243 236 257 367    BNP (last 3 results)  Recent Labs  08/27/15 0505  BNP 534.6*    CBG:  Recent Labs Lab 08/30/15 0724 08/30/15 1143 08/30/15 1719 08/30/15 2033 08/31/15 0733  GLUCAP 203* 143* 162* 256* 144*     SIGNED: Time coordinating   discharge: 30 minutes  MAGICK-MYERS, ISKRA, MD  [facility] 08/31/2015, 10:19 AM Pager [phone]  If 7PM-7AM, please contact night-coverage [email or web address] Password [email or web address]     

## 2015-08-31 NOTE — Care Management Note (Signed)
Case Management Note  Patient Details  Name: [name] MRN: [id number] Date of Birth: 12-05-1933  Subjective/Objective:                    Action/Plan:d/c home w/HHPT/HHOT-[facility].   Expected Discharge Date:   (unknown)               Expected Discharge Plan:  [place]  In-House Referral:     Discharge planning Services  CM Consult  Post Acute Care Choice:    Choice offered to:  Patient  DME Arranged:    DME Agency:     HH Arranged:  PT, OT HH Agency:  [place]  Status of Service:  Completed, signed off  Medicare Important Message Given:  Yes-second notification given Date Medicare IM Given:    Medicare IM give by:    Date Additional Medicare IM Given:    Additional Medicare Important Message give by:     If discussed at [place] of Stay Meetings, dates discussed:    Additional Comments:  Dessa Phi, RN 08/31/2015, 11:23 AM

## 2015-09-01 ENCOUNTER — Other Ambulatory Visit: Payer: Self-pay | Admitting: Internal Medicine

## 2015-09-01 LAB — GI PATHOGEN PANEL BY PCR, STOOL
C DIFFICILE TOXIN A/B: NOT DETECTED
CAMPYLOBACTER BY PCR: NOT DETECTED
CRYPTOSPORIDIUM BY PCR: NOT DETECTED
E coli (ETEC) LT/ST: NOT DETECTED
E coli (STEC): NOT DETECTED
E coli 0157 by PCR: NOT DETECTED
G lamblia by PCR: NOT DETECTED
NOROVIRUS G1/G2: NOT DETECTED
ROTAVIRUS A BY PCR: NOT DETECTED
SALMONELLA BY PCR: NOT DETECTED
Shigella by PCR: NOT DETECTED

## 2015-09-01 LAB — STOOL CULTURE

## 2015-09-02 ENCOUNTER — Telehealth: Payer: Self-pay | Admitting: *Deleted

## 2015-09-02 NOTE — Telephone Encounter (Signed)
Transition Care Management Follow-up Telephone Call  How have you been since you were released from the hospital? Good; "Feeling like I'm getting my strength back again"   Do you understand why you were in the hospital? YES   Do you understand the discharge instrcutions? YES  Items Reviewed:  Medications reviewed: No; can't remember off top of head medications  Allergies reviewed: YES  Dietary changes reviewed: Carb diet in hospital but no restrictions with discharge   Referrals reviewed: N/A; Plans for physical therapy to come to house to begin working with patient    Functional Questionnaire:   Activities of Daily Living (ADLs):   He states they are independent in the following: bathroom and moving around in house States they require assistance with the following:nothing; "I'm back to myself again"   Any transportation issues/concerns?: NO   Any patient concerns? NO   Confirmed importance and date/time of follow-up visits scheduled: YES (Friday, 30th)    Confirmed with patient if condition begins to worsen call PCP or go to the ER.  Patient was given the Call-a-Nurse line [phone]: YES

## 2015-09-02 NOTE — Telephone Encounter (Signed)
Herbert Deaner called from [facility] [phone]) to let Dr Maudie Mercury know about the pts visit. 1) the medical report has that he is using an Albuterol inhaler and vitamin D and the pt stated he is NOT taking either of these but is taking a stool softener and Meclizine   2) he is requesting a verbal order to continue PT 2x a week for 2 weeks and 1x a week for 3 weeks and stated he recommended OT but the pt declined due to copay so he would also like a verbal order for skilled nursing for medical mgmt and depression    3)states the pt complains of fatigue and he is not sleeping due to incontinence and his wife states he "screams out" at night      4) pt also complains of LUE numbness and he is not sure how long he has had this.

## 2015-09-03 NOTE — Telephone Encounter (Signed)
I called [name] and left a detailed message with the information below and I called the pt and offered to schedule an appt.  Patient states none of these problems are new and he would rather wait until his hospital follow up visit on 9/30 to discuss this.

## 2015-09-03 NOTE — Telephone Encounter (Signed)
Needs office visit. Ok to follow order for PT from hospital doctor, but other issues need to be addressed at office visit.

## 2015-09-09 ENCOUNTER — Other Ambulatory Visit: Payer: Self-pay | Admitting: Internal Medicine

## 2015-09-09 ENCOUNTER — Telehealth: Payer: Self-pay | Admitting: Family Medicine

## 2015-09-09 NOTE — Telephone Encounter (Signed)
I called the pt and he stated the Colace has helped some and he will await the appt on tomorrow.

## 2015-09-09 NOTE — Telephone Encounter (Signed)
[name] with [facility] called to advise: Pt reports constipation several days, started colace today with slow progress. abdominal pain between 6-10   Elevated BP: sitting BP  150/80                        Standing BP 160/80  Pt has appointment tomorrow at 1:30 pm

## 2015-09-09 NOTE — Telephone Encounter (Signed)
Advise appt, ok tomorrow if they and pt feel ok to wait, otherwise today.

## 2015-09-10 ENCOUNTER — Encounter: Payer: Self-pay | Admitting: Family Medicine

## 2015-09-10 ENCOUNTER — Ambulatory Visit (INDEPENDENT_AMBULATORY_CARE_PROVIDER_SITE_OTHER): Payer: Medicare Other | Admitting: Family Medicine

## 2015-09-10 VITALS — BP 120/60 | HR 75 | Temp 97.8°F | Ht 70.0 in | Wt 195.0 lb

## 2015-09-10 DIAGNOSIS — D649 Anemia, unspecified: Secondary | ICD-10-CM

## 2015-09-10 DIAGNOSIS — E1165 Type 2 diabetes mellitus with hyperglycemia: Secondary | ICD-10-CM

## 2015-09-10 DIAGNOSIS — E1151 Type 2 diabetes mellitus with diabetic peripheral angiopathy without gangrene: Secondary | ICD-10-CM

## 2015-09-10 DIAGNOSIS — I10 Essential (primary) hypertension: Secondary | ICD-10-CM | POA: Diagnosis not present

## 2015-09-10 DIAGNOSIS — M1990 Unspecified osteoarthritis, unspecified site: Secondary | ICD-10-CM

## 2015-09-10 DIAGNOSIS — E1159 Type 2 diabetes mellitus with other circulatory complications: Secondary | ICD-10-CM | POA: Diagnosis not present

## 2015-09-10 DIAGNOSIS — A403 Sepsis due to Streptococcus pneumoniae: Secondary | ICD-10-CM | POA: Diagnosis not present

## 2015-09-10 DIAGNOSIS — Z23 Encounter for immunization: Secondary | ICD-10-CM | POA: Diagnosis not present

## 2015-09-10 DIAGNOSIS — IMO0002 Reserved for concepts with insufficient information to code with codable children: Secondary | ICD-10-CM

## 2015-09-10 DIAGNOSIS — E785 Hyperlipidemia, unspecified: Secondary | ICD-10-CM

## 2015-09-10 DIAGNOSIS — I2510 Atherosclerotic heart disease of native coronary artery without angina pectoris: Secondary | ICD-10-CM

## 2015-09-10 DIAGNOSIS — I2583 Coronary atherosclerosis due to lipid rich plaque: Secondary | ICD-10-CM

## 2015-09-10 DIAGNOSIS — J189 Pneumonia, unspecified organism: Secondary | ICD-10-CM

## 2015-09-10 NOTE — Patient Instructions (Signed)
BEFORE YOU LEAVE: -schedule lab only appointment (you do NOT need to fast) to check your blood counts in 1 month -schedule follow up in 3-4 months  STOP the Naproxen Can use tylenol only as needed per instructions for pain.  Follow up with your cardiologist and your diabetes doctors as planned.  We will cancel the physical therapy and home health orders - but please continue to do your exercises at least 4 days per week and return to our clinic if any weakness or balance issues persist or other concerns.

## 2015-09-10 NOTE — Progress Notes (Signed)
HPI:  Transitional care visit, see phone call from our office following recent hospital discharge.  Hospitalized 9/15-9/20/16 for sepsis 2ndary to LLL pneumonia. Treated with 3 days of vanc and zosyn then oral doxycycline. Stay complicated by possible mild colitis.C. Diff PCR negative. Discharged home with OT/PT and HH but he refused HH and OT and reports he does not need the PT at home. He is not very happy with the therapist because he reports he will write down things that the pt did not tell him. He is able to do the home exercises on his own and feels like strength and balance have improved significantly and does not feel he needs this. Reports he is able to ambulate normally, go out walking and has been driving and feels fine the last several days. Denies: CP, SOB, weakness, falls, fevers, abd pain, DOE. Reports he is eating a good diet and wife agrees. Denies diarrhea or melena or hematochezia.    Other chronic issues:  Uncontrolled type 2 diabetes mellitus with peripheral circulatory disorder - Last A1c 7.7 on 03/16/15, not well controled. Patient is taking metformin and Levemir at home -reports home fasting BS 95-125 since discharge - reports has scheduled follow up with his endocrinologist   Essential hypertension - stable -denies: CP, SOB, DOE -BP great today   Chronic diastolic congestive heart failure - 2-D echo on 08/26/14 showed EF 60-65% with grade 1 diastolic dysfunction. Patient is on Lasix 40 mg prn at home. - CHF is compensated at this time  - denies swelling, SOB, DOE -reports he has follow up scheduled with his cardiologist   OAB (overactive bladder) - continue Cardura    Diarrhea - possibly related to PNA vs undelrying colitis but this was not very impressive on CT abd  - C. Diff by PCR negative - continue Florastor - resolved  -denies diarrhea, abd pain, melena, hematochezia   Hyperlipemia - Last LDL was 43 on 03/26/15 - Continue home medication  Pravastatin   GERD - cont current tx    CAD (coronary artery disease - no chest pain. - continue ASA, follow up with cardiologist for routine care  Anemia: -in hospital and mild in the past on review cardiology records -he denies any melena, weight loss, fevers, chronic fatigue or hematochezia or bowel changes -he take naproxen twice daily and has for years because somebody told him to for OA - doesn't feel he needs it  ROS: See pertinent positives and negatives per HPI.  Past Medical History  Diagnosis Date  . Diabetes mellitus     type 2  . Diverticulosis of colon   . GERD (gastroesophageal reflux disease)   . Osteoarthritis   . Stress incontinence, male     urge  . Dyslipidemia   . Hernia, hiatal   . Hyperlipidemia   . Hypertension   . DVT (deep venous thrombosis)   . Overactive bladder     Hx: of  . IBS (irritable bowel syndrome)     Hx: of  . Pneumonia 2002    history of  . Varicose veins of lower extremities with other complications 8/36/6294  . Atherosclerosis of native arteries of the extremities with intermittent claudication 02/10/2013  . DIVERTICULOSIS, COLON 06/13/2007    Qualifier: Diagnosis of  By: Leanne Chang MD, Bruce    . DEGENERATIVE JOINT DISEASE, GENERALIZED 01/24/2010    Annotation: diffuse pains of joints Qualifier: Diagnosis of  By: Leanne Chang MD, Bruce    . Shingles 09/11/2013  . Chronic diastolic congestive  heart failure 11/23/2014  . CAD (coronary artery disease) 10/14/2014  . PERIPHERAL NEUROPATHY 05/30/2010    Annotation: likely DM related Qualifier: Diagnosis of  By: Leanne Chang MD, Bruce    . Edema 08/19/2014    Past Surgical History  Procedure Laterality Date  . Wrist tendon surgery    . Cts bilateral    . Knee arthroscopy  1988, 1992    bilateral  . Total knee arthroplasty  2005, 2007  . Lumbar left arm  1961  . Tonsillectomy    . Back surgery    . Colonoscopy      Hx: of  . Laminectomy  08/18/2013    DECOMPRESSIVE LAMINECTOMIES  . Lumbar  laminectomy/decompression microdiscectomy N/A 08/18/2013    Procedure: Lumbar two-three, lumbar three-four, lumbar four-five decompressive laminectomies;  Surgeon: [name], MD;  Location: [place] [facility];  Service: Neurosurgery;  Laterality: N/A;  . Left and right heart catheterization with coronary angiogram N/A 09/03/2014    Procedure: LEFT AND RIGHT HEART CATHETERIZATION WITH CORONARY ANGIOGRAM;  Surgeon: [name], MD;  Location: [facility] CATH LAB;  Service: Cardiovascular;  Laterality: N/A;  . Cataract extraction Bilateral 08/10/2015,08/15/2015    Family History  Problem Relation Age of Onset  . Other Mother 39    CABG  . Heart disease Mother   . Hypertension Mother   . Hyperlipidemia Son     Social History   Social History  . Marital Status: Married    Spouse Name: N/A  . Number of Children: 2  . Years of Education: N/A   Social History Main Topics  . Smoking status: Never Smoker   . Smokeless tobacco: Never Used  . Alcohol Use: No  . Drug Use: No  . Sexual Activity: Not Asked   Other Topics Concern  . 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  amLODipine (NORVASC) 5 MG tablet, Take 5 mg by mouth daily. , Disp: , Rfl:  .  aspirin 81 MG tablet, Take 81 mg by mouth daily. , Disp: , Rfl:  .  b complex vitamins tablet, Take 1 tablet by mouth daily., Disp: , Rfl:  .  BESIVANCE 0.6 % SUSP, Place 1 drop into the right eye 3 (three) times daily. Stopping 08-31-15, Disp: , Rfl:  .  Cholecalciferol (VITAMIN D PO), Take 1 capsule by mouth daily., Disp: , Rfl:  .  doxazosin (CARDURA) 4 MG tablet, TAKE ONE TABLET BY MOUTH DAILY, Disp: 90 tablet,  Rfl: 2 .  doxycycline (VIBRA-TABS) 100 MG tablet, Take 1 tablet (100 mg total) by mouth every 12 (twelve) hours., Disp: 10 tablet, Rfl: 0 .  DUREZOL 0.05 % EMUL, Apply 1 drop to eye. BID for 1 week (9-13 to 9-20), QD for 1 week (9-20 to 9-27), then stop - RIGHT EYE QD for 1 week in LEFT EYE 9-14 to 09-01-15, Disp: , Rfl:  .  furosemide (LASIX) 20 MG tablet, Take 2 tablets (40 mg total) by mouth daily as needed. (Patient taking differently: Take 20 mg by mouth every other day. ),  Disp: 60 tablet, Rfl: 11 .  insulin aspart (NOVOLOG FLEXPEN) 100 UNIT/ML FlexPen, Inject 8-12 Units into the skin 3 (three) times daily with meals. (Patient taking differently: Inject 8-12 Units into the skin 3 (three) times daily with meals. Sliding scale), Disp: 15 mL, Rfl: 2 .  Insulin Detemir (LEVEMIR FLEXTOUCH) 100 UNIT/ML Pen, INJECT 20 UNITS INTO THE SKIN DAILY AT 10 PM., Disp: 15 mL, Rfl: 2 .  metFORMIN (GLUCOPHAGE) 1000 MG tablet, Take 1 tablet (1,000 mg total) by mouth 2 (two) times daily with a meal., Disp: 180 tablet, Rfl: 3 .  Multiple Vitamins-Minerals (CENTRUM SILVER PO), Take 1 tablet by mouth daily. , Disp: , Rfl:  .  naproxen sodium (ANAPROX) 220 MG tablet, Take 220 mg by mouth 2 (two) times daily with a meal., Disp: , Rfl:  .  NOVOLOG FLEXPEN 100 UNIT/ML FlexPen, INJECT 8 TO 12 UNITS 3 TIMES DAILY WITH MEALS AS DIRECTED., Disp: 15 mL, Rfl: 1 .  omeprazole (PRILOSEC) 20 MG capsule, TAKE 1 CAPSULE (20 MG TOTAL) BY MOUTH DAILY., Disp: 90 capsule, Rfl: 0 .  ONETOUCH VERIO test strip, USE TO TEST BLOOD SUGAR 3 TIMES DAILY AS INSTRUCTED., Disp: 100 each, Rfl: 4 .  pravastatin (PRAVACHOL) 40 MG tablet, Take 1 tablet (40 mg total) by mouth every evening., Disp: 30 tablet, Rfl: 11 .  PROLENSA 0.07 % SOLN, Place 1 drop into both eyes every evening. Stop in left eye 09-07-15 right eye on 09-14-15, Disp: , Rfl:  .  saccharomyces boulardii (FLORASTOR) 250 MG capsule, Take 1 capsule (250 mg total) by mouth 2 (two) times  daily., Disp: 40 capsule, Rfl: 0  EXAM:  Filed Vitals:   09/10/15 1319  BP: 120/60  Pulse: 75  Temp: 97.8 F (36.6 C)    Body mass index is 27.98 kg/(m^2).  GENERAL: vitals reviewed and listed above, alert, oriented, appears well hydrated and in no acute distress  HEENT: atraumatic, conjunttiva clear, no obvious abnormalities on inspection of external nose and ears  NECK: no obvious masses on inspection  LUNGS: clear to auscultation bilaterally, no wheezes, rales or rhonchi, good air movement  CV: HRRR, no peripheral edema  MS: moves all extremities without noticeable abnormality  PSYCH: pleasant and cooperative, no obvious depression or anxiety  ASSESSMENT AND PLAN:  Discussed the following assessment and plan:  Sepsis due to Streptococcus pneumoniae CAP (community acquired pneumonia) -recovering quite well  Uncontrolled type 2 diabetes mellitus with peripheral circulatory disorder -follow up with endo   Essential hypertension Hyperlipemia Coronary artery disease due to lipid rich plaque -stable  Anemia, unspecified anemia type - Plan: CBC with Differential -stop naproxen, recheck CBC in one month, discussed potential causes  Osteoarthritis, unspecified osteoarthritis type, unspecified site -tylenol prn  -Patient advised to return or notify a doctor immediately if symptoms worsen or persist or new concerns arise.  Patient Instructions  BEFORE YOU LEAVE: -schedule lab only appointment (you do NOT need to fast) to check your blood counts in 1 month -schedule follow up in 3-4 months  STOP the Naproxen Can use tylenol only as needed per instructions for pain.  Follow up with your cardiologist and your diabetes doctors as planned.  We will cancel the physical therapy and home health orders - but please continue to do your exercises at least 4 days per week and return to our clinic if any weakness or balance issues persist or other concerns.     Colin Benton R.

## 2015-09-10 NOTE — Progress Notes (Signed)
Pre visit review using our clinic review tool, if applicable. No additional management support is needed unless otherwise documented below in the visit note. 

## 2015-09-13 ENCOUNTER — Telehealth: Payer: Self-pay | Admitting: Family Medicine

## 2015-09-13 NOTE — Telephone Encounter (Signed)
I called Clair Gulling and informed him per Dr Maudie Mercury and the pt to cancel PT and he agreed.  I also faxed the home health plan of care back to [place] at [phone] with a note stating this as well.

## 2015-09-13 NOTE — Telephone Encounter (Signed)
Herbert Deaner, Physical Therapist from [facility] called regarding this patient. Mr. Heber Aurora states that he spoke with the patient and an order was given to cancel therapy. Please confirm with therapist he can be reached at [phone]

## 2015-09-27 ENCOUNTER — Other Ambulatory Visit: Payer: Self-pay

## 2015-10-08 ENCOUNTER — Ambulatory Visit (INDEPENDENT_AMBULATORY_CARE_PROVIDER_SITE_OTHER): Payer: Medicare Other | Admitting: Family Medicine

## 2015-10-08 ENCOUNTER — Other Ambulatory Visit (INDEPENDENT_AMBULATORY_CARE_PROVIDER_SITE_OTHER): Payer: Medicare Other

## 2015-10-08 ENCOUNTER — Encounter: Payer: Self-pay | Admitting: Family Medicine

## 2015-10-08 VITALS — BP 116/60 | HR 84 | Temp 97.0°F | Ht 70.0 in | Wt 199.0 lb

## 2015-10-08 DIAGNOSIS — T148 Other injury of unspecified body region: Secondary | ICD-10-CM

## 2015-10-08 DIAGNOSIS — D649 Anemia, unspecified: Secondary | ICD-10-CM | POA: Diagnosis not present

## 2015-10-08 DIAGNOSIS — T148XXA Other injury of unspecified body region, initial encounter: Secondary | ICD-10-CM

## 2015-10-08 DIAGNOSIS — W19XXXA Unspecified fall, initial encounter: Secondary | ICD-10-CM | POA: Diagnosis not present

## 2015-10-08 DIAGNOSIS — S161XXA Strain of muscle, fascia and tendon at neck level, initial encounter: Secondary | ICD-10-CM | POA: Diagnosis not present

## 2015-10-08 LAB — CBC WITH DIFFERENTIAL/PLATELET
BASOS PCT: 0.4 % (ref 0.0–3.0)
Basophils Absolute: 0 10*3/uL (ref 0.0–0.1)
Eosinophils Absolute: 0.1 10*3/uL (ref 0.0–0.7)
Eosinophils Relative: 1.4 % (ref 0.0–5.0)
HEMATOCRIT: 34.8 % — AB (ref 39.0–52.0)
Hemoglobin: 11.4 g/dL — ABNORMAL LOW (ref 13.0–17.0)
LYMPHS PCT: 35.7 % (ref 12.0–46.0)
Lymphs Abs: 2.1 10*3/uL (ref 0.7–4.0)
MCHC: 32.9 g/dL (ref 30.0–36.0)
MCV: 86.8 fl (ref 78.0–100.0)
MONOS PCT: 8.1 % (ref 3.0–12.0)
Monocytes Absolute: 0.5 10*3/uL (ref 0.1–1.0)
NEUTROS ABS: 3.2 10*3/uL (ref 1.4–7.7)
Neutrophils Relative %: 54.4 % (ref 43.0–77.0)
PLATELETS: 189 10*3/uL (ref 150.0–400.0)
RBC: 4.01 Mil/uL — ABNORMAL LOW (ref 4.22–5.81)
RDW: 15.5 % (ref 11.5–15.5)
WBC: 5.9 10*3/uL (ref 4.0–10.5)

## 2015-10-08 NOTE — Progress Notes (Signed)
Pre visit review using our clinic review tool, if applicable. No additional management support is needed unless otherwise documented below in the visit note. 

## 2015-10-08 NOTE — Progress Notes (Signed)
HPI:  Acute visit for:  Multiple abrasions: -mechanical fall on hill outside when tripped 1 week ago -caught himself as landed on R side on concrete but suffered abrasion to skin on R later knee and R upper arm and hand -no loc, able to bear weight immediately -has had some pain in arm, neck and at wound on leg since but seems to be recovering - take tylenol at times which provides adequate relief -using walker or cane   -feels is healing well, but had to come in for labs so decided to have check also  ROS: See pertinent positives and negatives per HPI.  Past Medical History  Diagnosis Date  . Diabetes mellitus     type 2  . Diverticulosis of colon   . GERD (gastroesophageal reflux disease)   . Osteoarthritis   . Stress incontinence, male     urge  . Dyslipidemia   . Hernia, hiatal   . Hyperlipidemia   . Hypertension   . DVT (deep venous thrombosis) ([place])   . Overactive bladder     Hx: of  . IBS (irritable bowel syndrome)     Hx: of  . Pneumonia 2002    history of  . Varicose veins of lower extremities with other complications 1/61/0960  . Atherosclerosis of native arteries of the extremities with intermittent claudication 02/10/2013  . DIVERTICULOSIS, COLON 06/13/2007    Qualifier: Diagnosis of  By: Leanne Chang MD, Bruce    . DEGENERATIVE JOINT DISEASE, GENERALIZED 01/24/2010    Annotation: diffuse pains of joints Qualifier: Diagnosis of  By: Leanne Chang MD, Bruce    . Shingles 09/11/2013  . Chronic diastolic congestive heart failure ([place]) 11/23/2014  . CAD (coronary artery disease) 10/14/2014  . PERIPHERAL NEUROPATHY 05/30/2010    Annotation: likely DM related Qualifier: Diagnosis of  By: Leanne Chang MD, Bruce    . Edema 08/19/2014    Past Surgical History  Procedure Laterality Date  . Wrist tendon surgery    . Cts bilateral    . Knee arthroscopy  1988, 1992    bilateral  . Total knee arthroplasty  2005, 2007  . Lumbar left arm  1961  . Tonsillectomy    . Back surgery    .  Colonoscopy      Hx: of  . Laminectomy  08/18/2013    DECOMPRESSIVE LAMINECTOMIES  . Lumbar laminectomy/decompression microdiscectomy N/A 08/18/2013    Procedure: Lumbar two-three, lumbar three-four, lumbar four-five decompressive laminectomies;  Surgeon: [name], MD;  Location: [facility];  Service: Neurosurgery;  Laterality: N/A;  . Left and right heart catheterization with coronary angiogram N/A 09/03/2014    Procedure: LEFT AND RIGHT HEART CATHETERIZATION WITH CORONARY ANGIOGRAM;  Surgeon: [name], MD;  Location: [facility] CATH LAB;  Service: Cardiovascular;  Laterality: N/A;  . Cataract extraction Bilateral 08/10/2015,08/15/2015    Family History  Problem Relation Age of Onset  . Other Mother 53    CABG  . Heart disease Mother   . Hypertension Mother   . Hyperlipidemia Son     Social History   Social History  . Marital Status: Married    Spouse Name: N/A  . Number of Children: 2  . Years of Education: N/A   Social History Main Topics  . Smoking status: Never Smoker   . Smokeless tobacco: Never Used  . Alcohol Use: No  . Drug Use: No  . Sexual Activity: Not Asked   Other Topics Concern  . 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  amLODipine (NORVASC) 5 MG tablet, Take 5 mg by mouth daily. , Disp: , Rfl:  .  aspirin 81 MG tablet, Take 81 mg by mouth daily. , Disp: , Rfl:  .  BESIVANCE 0.6 % SUSP, Place 1 drop into the right eye 3 (three) times daily. Stopping 08-31-15, Disp: , Rfl:  .  Cholecalciferol (VITAMIN D PO), Take 1 capsule by mouth daily., Disp: , Rfl:  .  doxazosin (CARDURA) 4 MG tablet, TAKE ONE TABLET BY MOUTH  DAILY, Disp: 90 tablet, Rfl: 2 .  doxycycline (VIBRA-TABS) 100 MG tablet, Take 1 tablet (100 mg total) by mouth every 12 (twelve) hours., Disp: 10 tablet, Rfl: 0 .  DUREZOL 0.05 % EMUL, Apply 1 drop to eye. BID for 1 week (9-13 to 9-20), QD for 1 week (9-20 to 9-27), then stop - RIGHT EYE QD for 1 week in LEFT EYE 9-14 to 09-01-15, Disp: , Rfl:  .  furosemide (LASIX) 20 MG tablet, Take 2 tablets (40 mg total) by mouth daily as needed. (Patient taking differently: Take 20 mg by mouth every other day. ), Disp: 60 tablet, Rfl: 11 .  insulin aspart (NOVOLOG FLEXPEN) 100 UNIT/ML FlexPen, Inject 8-12 Units into the skin 3 (three) times daily with meals. (Patient taking differently: Inject 8-12 Units into the skin 3 (three) times daily with meals. Sliding scale), Disp: 15 mL, Rfl: 2 .  Insulin Detemir (LEVEMIR FLEXTOUCH) 100 UNIT/ML Pen, INJECT 20 UNITS INTO THE SKIN DAILY AT 10 PM., Disp: 15 mL, Rfl: 2 .  metFORMIN (GLUCOPHAGE) 1000 MG tablet, Take 1 tablet (1,000 mg total) by mouth 2 (two) times daily with a meal., Disp: 180 tablet, Rfl: 3 .  Multiple Vitamins-Minerals (CENTRUM SILVER PO), Take 1 tablet by mouth daily. , Disp: , Rfl:  .  naproxen sodium (ANAPROX) 220 MG tablet, Take 220 mg by mouth 2 (two) times daily with a meal., Disp: , Rfl:  .  NOVOLOG FLEXPEN 100 UNIT/ML FlexPen, INJECT 8 TO 12 UNITS 3 TIMES DAILY WITH MEALS AS DIRECTED., Disp: 15 mL, Rfl: 1 .  omeprazole (PRILOSEC) 20 MG capsule, TAKE 1 CAPSULE (20 MG TOTAL) BY MOUTH DAILY., Disp: 90 capsule, Rfl: 0 .  ONETOUCH VERIO test strip, USE TO TEST BLOOD SUGAR 3 TIMES DAILY AS INSTRUCTED., Disp: 100 each, Rfl: 4 .  pravastatin (PRAVACHOL) 40 MG tablet, Take 1 tablet (40 mg total) by mouth every evening., Disp: 30 tablet, Rfl: 11 .  PROLENSA 0.07 % SOLN, Place 1 drop into both eyes every evening. Stop in left eye 09-07-15 right eye on 09-14-15, Disp: , Rfl:  .  saccharomyces boulardii (FLORASTOR) 250 MG capsule, Take 1 capsule (250 mg total) by  mouth 2 (two) times daily., Disp: 40 capsule, Rfl: 0  EXAM:  Filed Vitals:   10/08/15 0943  BP: 116/60  Pulse: 84  Temp: 97 F (36.1 C)    Body mass index is 28.55 kg/(m^2).  GENERAL: vitals reviewed and listed above, alert, oriented, appears well hydrated and in no acute distress  HEENT: atraumatic, conjunttiva clear, no obvious abnormalities on inspection of external nose and ears  NECK: no obvious masses on inspection, normal ROM, no bony TTP, minimal TTP in R subocc muscles and traps  LUNGS: clear to auscultation bilaterally, no wheezes, rales or rhonchi, good air movement  CV: HRRR, no peripheral edema  MS: moves all extremities without noticeable abnormality, normal gait with walker, no bony TTP in arm, shoulder, neck or leg in areas of concern, normal ROM heada and neck, shoulder/arm and legs/knee  SKIN: large healing superficial abrasion R lateral leg just above knee and smaller superficial healing abrasion R lat upper arm no induration, surrounding erythema, pus or other signs of infection  PSYCH: pleasant and cooperative, no obvious depression or anxiety  ASSESSMENT AND PLAN:  Discussed the following assessment and plan:  Fall, initial encounter  Skin abrasion  Neck strain, initial encounter  -abrasion clean and healing well without signs of infection -no findings on exam to suggest osseous or serious injury -fall precautions and wound care recs provided -cont prn safe dosing of tylenol as needed for pain -Patient advised to return or notify a doctor immediately if symptoms worsen or persist or new concerns arise.  There are no Patient Instructions on file for this visit.   Bobby Benton R.

## 2015-10-14 ENCOUNTER — Other Ambulatory Visit (INDEPENDENT_AMBULATORY_CARE_PROVIDER_SITE_OTHER): Payer: Medicare Other | Admitting: *Deleted

## 2015-10-14 ENCOUNTER — Encounter: Payer: Self-pay | Admitting: Internal Medicine

## 2015-10-14 ENCOUNTER — Other Ambulatory Visit: Payer: Self-pay | Admitting: *Deleted

## 2015-10-14 ENCOUNTER — Ambulatory Visit (INDEPENDENT_AMBULATORY_CARE_PROVIDER_SITE_OTHER): Payer: Medicare Other | Admitting: Internal Medicine

## 2015-10-14 VITALS — BP 104/60 | HR 88 | Temp 98.0°F | Resp 12 | Wt 200.6 lb

## 2015-10-14 DIAGNOSIS — E1165 Type 2 diabetes mellitus with hyperglycemia: Secondary | ICD-10-CM

## 2015-10-14 DIAGNOSIS — IMO0002 Reserved for concepts with insufficient information to code with codable children: Secondary | ICD-10-CM

## 2015-10-14 DIAGNOSIS — E1151 Type 2 diabetes mellitus with diabetic peripheral angiopathy without gangrene: Secondary | ICD-10-CM

## 2015-10-14 LAB — POCT GLYCOSYLATED HEMOGLOBIN (HGB A1C): Hemoglobin A1C: 7

## 2015-10-14 NOTE — Progress Notes (Signed)
Subjective:     Patient ID: [name], male   DOB: [birth year], 81 y.o.   MRN: [id number]  HPI [name] is a very pleasant 81 y.o. man returning for management of DM2, dx 07/2012, uncontrolled, insulin-dependent, with complications (peripheral neuropathy, PAD). Last visit 3 mo ago.  In 08/2015 he was hospitalized for ?sepsis with strep pneumoniae after acquiring left lower lobe pneumonia.   He had a fall on 10/08/2015. He is now walking with a walker.  Last HbA1C was: Lab Results  Component Value Date   HGBA1C 7.7 07/14/2015   HGBA1C 8.9* 04/13/2015   HGBA1C 9.2* 01/13/2015   He is on: - Metformin 1000 mg 2x a day - Levemir 20 units at bedtime - NovoLog 15 min before a meal:  8 >> 6 units before breakfast   10 >> 8 units before lunch  10 >> 8 units before dinner Please add 2 units if you have a larger meal. Please subtract 2 units if you plan to be active after a meal.  Pt again brings a great log, he checks sugars ~2-4x a day - greatly improved: - am: 54, 62-121 >> 159-215, 236 >>176-210 >> 131-160 >> 112, 120, 140-190 >> 103-140, 160 >> 94-137, 150 - 2h after b'fast: 251-302 >> 123-179 >> 191-239 >> 114-187 >> 84, 90-140 >> 140-180 >> 104-156 >> 119-170 - before lunch: 108-165, 186, 207 >> 150-160 >> 93-166, 228 >> 100-150 >> 142-170, 215 >> 97-148 >> 92-140 - 2h after lunch: 102, 179-215 >> 184-214 >> 94-177, 196 >> 133-160 >> n/c >> 110, 130 >> 138-160 >> 140-183 - dinner: 177-290 >> 152, 210 >> 136 >> 81-162 >> 139, 140, 176 >> 105-220 >> 108-130, 183 >> 104-174, 201 - 2h after dinner: 137, 162-250 >> 197-230 >> 97, 104-186, 2x200 >> 92-161 >> 120-225 >> 107-180 >> 150-170 - bedtime: 220-325 >> 142-250 >> 124, 176-248 >> 108-164 >> 138, 139 >> 115-220 >> 105-161 >> 116-184 Had lows in am (47-60s) >> now high 60s - rarely >> 90.  - no CKD. Last BUN/Cr: Lab Results  Component Value Date   BUN 12 08/31/2015   CREATININE 0.71 08/31/2015  Last ACR was normal in  05/2013. - last lipid panel: Lab Results  Component Value Date   CHOL 99 03/26/2015   HDL 47.60 03/26/2015   LDLCALC 43 03/26/2015   TRIG 44.0 03/26/2015   CHOLHDL 2 03/26/2015  Stopped Pravastatin since last visit.Marland Kitchen - He has peripheral neuropathy. He is taking a B complex vitamin, and has a neuropathy cream. [name] at the [facility].  - Last eye exam 07/26/2015: no Bobby ([name]). Cataract Sx in 08/2015.  - On ASA 81.  He had a heart catheterization on 09/03/2014 >> 2 small Blockages >> no intervention other than statins. 2D ECHO with normal EF.  I reviewed pt's medications, allergies, PMH, social hx, family hx, and changes were documented in the history of present illness. Otherwise, unchanged from my initial visit note. He also stopped Alleve and Pravastatin.  Review of Systems Constitutional: no weight gain, no fatigue, no subjective hyperthermia/hypothermia Eye: no blurry vision, no xerophthalmia ENT: no sore throat, no dysphagia/odynophagia/hoarseness Cardiovascular: no CP/SOB/no palpitations/leg swelling Respiratory: no cough/SOB Gastrointestinal: no N/V/D/C Musculoskeletal: no muscle/joint aches Skin: no rash Neuro: no tremors; + weakness in legs   Objective:   Physical Exam BP 104/60 mmHg  Pulse 88  Temp(Src) 98 F (36.7 C) (Oral)  Resp 12  Wt 200 lb 9.6  oz (90.992 kg)  SpO2 97% Body mass index is 28.78 kg/(m^2).  Wt Readings from Last 3 Encounters:  10/14/15 200 lb 9.6 oz (90.992 kg)  10/08/15 199 lb (90.266 kg)  09/10/15 195 lb (88.451 kg)  Constitutional: normal weight, in NAD Eyes: PERRLA, EOMI, no exophthalmos ENT: moist mucous membranes, no thyromegaly, no cervical lymphadenopathy Cardiovascular: RRR, + SEM 2/6, no RG, + leg edema R>L Respiratory: CTA B Gastrointestinal: abdomen soft, NT, ND, BS+ Skin: moist, warm, no rashes Neurological: no tremor with outstretched hands  Assessment:     1. DM2, non-insulin-dependent, uncontrolled,  with complications - target P7T for him: 7-7.5% - peripheral neuropathy - PAD [name]  Plan:     1. Diabetes mellitus type 2, with improved control after adding mealtime insulin. Last hemoglobin A1c was improved, at 7.7%. No lows. Will continue current regimen: - I advised him to: Patient Instructions  Please continue: - Metformin 1000 mg 2x a day - Levemir 20 units at bedtime - NovoLog 15 min before a meal:  6 units before breakfast   8 units before lunch  8 units before dinner Please add 2 units if you have a larger meal. Please subtract 2 units if you plan to be active after a meal.  Please return in 3 months with your sugar log.   - he is very conscientious about taking his medicines and keeping a good CBG log >> continue checking CBGs 3x a day.  - up to date with eye exams and flu shots - Will check HbA1c today >> 7.0% (perfect!) - RTC in 3 mo with sugar log.

## 2015-10-14 NOTE — Patient Instructions (Signed)
Please continue: - Metformin 1000 mg 2x a day - Levemir 20 units at bedtime - NovoLog 15 min before a meal:  6 units before breakfast   8 units before lunch  8 units before dinner Please add 2 units if you have a larger meal. Please subtract 2 units if you plan to be active after a meal.  Please come back for a follow-up appointment in 3 months.

## 2015-10-20 ENCOUNTER — Other Ambulatory Visit: Payer: Self-pay | Admitting: Cardiology

## 2015-10-22 ENCOUNTER — Other Ambulatory Visit: Payer: Self-pay | Admitting: *Deleted

## 2015-10-22 MED ORDER — OMEPRAZOLE 20 MG PO CPDR: DELAYED_RELEASE_CAPSULE | ORAL | Status: DC

## 2015-10-28 ENCOUNTER — Ambulatory Visit: Payer: Medicare Other | Admitting: Family Medicine

## 2015-11-02 ENCOUNTER — Other Ambulatory Visit: Payer: Self-pay | Admitting: *Deleted

## 2015-11-02 MED ORDER — INSULIN DETEMIR 100 UNIT/ML FLEXPEN: PEN_INJECTOR | SUBCUTANEOUS | Status: DC

## 2015-11-16 ENCOUNTER — Other Ambulatory Visit: Payer: Self-pay | Admitting: Internal Medicine

## 2015-11-16 NOTE — Progress Notes (Signed)
HPI: FU edema and chest pain. ABIs 3/14 normal. Seen previously for chest pain, dyspnea and lower ext edema. Echocardiogram September 2015 showed normal LV function, grade 1 diastolic dysfunction and mild mitral regurgitation. Moderate left atrial enlargement. Cardiac catheterization September 2015 showed a 30% left main, 40-50% LAD and no obstructive disease in the circumflex or right coronary artery. PCWP 4. PA pressure 19 over 4. Since last seen, He continues with some dyspnea on exertion, orthopnea and bilateral lower extremity edema. Occasional chest tightness with exertion.  Current Outpatient Prescriptions  Medication Sig Dispense Refill  . acetaminophen (TYLENOL) 500 MG tablet Take 500 mg by mouth 2 (two) times daily.    Marland Kitchen amLODipine (NORVASC) 5 MG tablet Take 5 mg by mouth daily.     Marland Kitchen aspirin 81 MG tablet Take 81 mg by mouth daily.     Marland Kitchen doxazosin (CARDURA) 4 MG tablet TAKE ONE TABLET BY MOUTH DAILY 90 tablet 2  . furosemide (LASIX) 20 MG tablet TAKE 2 TABLETS (40 MG TOTAL) BY MOUTH DAILY AS NEEDED. 60 tablet 1  . Insulin Detemir (LEVEMIR FLEXTOUCH) 100 UNIT/ML Pen INJECT 20 UNITS INTO THE SKIN DAILY AT 10 PM. 15 mL 2  . metFORMIN (GLUCOPHAGE) 1000 MG tablet Take 1 tablet (1,000 mg total) by mouth 2 (two) times daily with a meal. 180 tablet 3  . Multiple Vitamins-Minerals (CENTRUM SILVER PO) Take 1 tablet by mouth daily.     Marland Kitchen NOVOLOG FLEXPEN 100 UNIT/ML FlexPen INJECT 8 TO 12 UNITS 3 TIMES DAILY WITH MEALS AS DIRECTED. 15 mL 1  . omeprazole (PRILOSEC) 20 MG capsule TAKE 1 CAPSULE (20 MG TOTAL) BY MOUTH DAILY. 90 capsule 1  . ONETOUCH VERIO test strip USE TO TEST BLOOD SUGAR 3 TIMES DAILY AS INSTRUCTED. 100 each 4  . Propylene Glycol (SYSTANE BALANCE) 0.6 % SOLN Apply to eye. 2 drops each eye once daily    . PROLENSA 0.07 % SOLN Place 1 drop into both eyes every evening. Stop in left eye 09-07-15 right eye on 09-14-15     No current facility-administered medications for this  visit.     Past Medical History  Diagnosis Date  . Diabetes mellitus     type 2  . Diverticulosis of colon   . GERD (gastroesophageal reflux disease)   . Osteoarthritis   . Stress incontinence, male     urge  . Dyslipidemia   . Hernia, hiatal   . Hyperlipidemia   . Hypertension   . DVT (deep venous thrombosis) ([place])   . Overactive bladder     Hx: of  . IBS (irritable bowel syndrome)     Hx: of  . Pneumonia 2002    history of  . Varicose veins of lower extremities with other complications 0000000  . Atherosclerosis of native arteries of the extremities with intermittent claudication 02/10/2013  . DIVERTICULOSIS, COLON 06/13/2007    Qualifier: Diagnosis of  By: Leanne Chang MD, Bruce    . DEGENERATIVE JOINT DISEASE, GENERALIZED 01/24/2010    Annotation: diffuse pains of joints Qualifier: Diagnosis of  By: Leanne Chang MD, Bruce    . Shingles 09/11/2013  . Chronic diastolic congestive heart failure ([place]) 11/23/2014  . CAD (coronary artery disease) 10/14/2014  . PERIPHERAL NEUROPATHY 05/30/2010    Annotation: likely DM related Qualifier: Diagnosis of  By: Leanne Chang MD, Bruce    . Edema 08/19/2014    Past Surgical History  Procedure Laterality Date  . Wrist tendon surgery    .  Cts bilateral    . Knee arthroscopy  1988, 1992    bilateral  . Total knee arthroplasty  2005, 2007  . Lumbar left arm  1961  . Tonsillectomy    . Back surgery    . Colonoscopy      Hx: of  . Laminectomy  08/18/2013    DECOMPRESSIVE LAMINECTOMIES  . Lumbar laminectomy/decompression microdiscectomy N/A 08/18/2013    Procedure: Lumbar two-three, lumbar three-four, lumbar four-five decompressive laminectomies;  Surgeon: [name], MD;  Location: [place] [facility];  Service: Neurosurgery;  Laterality: N/A;  . Left and right heart catheterization with coronary angiogram N/A 09/03/2014    Procedure: LEFT AND RIGHT HEART CATHETERIZATION WITH CORONARY ANGIOGRAM;  Surgeon: [name], MD;  Location: [facility] CATH LAB;   Service: Cardiovascular;  Laterality: N/A;  . Cataract extraction Bilateral 08/10/2015,08/15/2015    Social History   Social History  . Marital Status: Married    Spouse Name: N/A  . Number of Children: 2  . Years of Education: N/A   Occupational History  . Not on file.   Social History Main Topics  . Smoking status: Never Smoker   . Smokeless tobacco: Never Used  . Alcohol Use: No  . Drug Use: No  . Sexual Activity: Not on file   Other Topics Concern  . 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 Remaining systems are negative.  Physical Exam: Well-developed well-nourished in no acute distress.  Skin is warm and dry.  HEENT is normal.  Neck is supple.  Chest is clear to auscultation with normal expansion.  Cardiovascular exam is regular rate and rhythm. 2/6 systolic murmur left sternal border. Abdominal exam nontender or distended. No masses palpated. Extremities show trace edema. neuro grossly intact  ECG 08/26/2015-sinus rhythm, PVCs, left axis deviation.

## 2015-11-17 ENCOUNTER — Ambulatory Visit (INDEPENDENT_AMBULATORY_CARE_PROVIDER_SITE_OTHER): Payer: Medicare Other | Admitting: Cardiology

## 2015-11-17 ENCOUNTER — Encounter: Payer: Self-pay | Admitting: Cardiology

## 2015-11-17 VITALS — BP 130/74 | HR 67 | Ht 70.0 in | Wt 204.4 lb

## 2015-11-17 DIAGNOSIS — Z79899 Other long term (current) drug therapy: Secondary | ICD-10-CM

## 2015-11-17 DIAGNOSIS — E785 Hyperlipidemia, unspecified: Secondary | ICD-10-CM

## 2015-11-17 DIAGNOSIS — I10 Essential (primary) hypertension: Secondary | ICD-10-CM

## 2015-11-17 MED ORDER — FUROSEMIDE 40 MG PO TABS: 40.0000 mg | ORAL_TABLET | Freq: Every day | ORAL | Status: DC

## 2015-11-17 NOTE — Assessment & Plan Note (Signed)
Management per primary care. 

## 2015-11-17 NOTE — Assessment & Plan Note (Signed)
Patient complains of dyspnea on exertion and lower extremity edema.Change Lasix to 40 mg daily. Check potassium, renal function and BNP in 1 week.

## 2015-11-17 NOTE — Patient Instructions (Signed)
Medication Instructions:  INCREASE Furosemide to 40 mg.  >>You may take 2 tablets daily until the 20 mg tablets run out.  >>A new prescription has been sent to your pharmacy electronically.  Labwork: Your physician recommends that you return for lab work in 1 week.  Testing/Procedures: NONE  Follow-Up: Dr [name] recommends that you schedule a follow-up appointment in 3 months.  If you need a refill on your cardiac medications before your next appointment, please call your pharmacy.

## 2015-11-17 NOTE — Assessment & Plan Note (Signed)
Continue aspirin. Statin was discontinued by primary care.

## 2015-11-17 NOTE — Assessment & Plan Note (Signed)
Blood pressure controlled. Continue present medications. 

## 2015-11-25 ENCOUNTER — Telehealth: Payer: Self-pay | Admitting: *Deleted

## 2015-11-25 LAB — BASIC METABOLIC PANEL
BUN: 14 mg/dL (ref 7–25)
CHLORIDE: 104 mmol/L (ref 98–110)
CO2: 27 mmol/L (ref 20–31)
CREATININE: 0.81 mg/dL (ref 0.70–1.11)
Calcium: 9.2 mg/dL (ref 8.6–10.3)
Glucose, Bld: 152 mg/dL — ABNORMAL HIGH (ref 65–99)
POTASSIUM: 4.4 mmol/L (ref 3.5–5.3)
Sodium: 139 mmol/L (ref 135–146)

## 2015-11-25 LAB — BRAIN NATRIURETIC PEPTIDE: Brain Natriuretic Peptide: 82.2 pg/mL (ref 0.0–100.0)

## 2015-11-25 NOTE — Telephone Encounter (Signed)
-----   Message from Lelon Perla, MD sent at 11/25/2015 11:00 AM EST ----- [facility]

## 2015-11-25 NOTE — Telephone Encounter (Signed)
LEFT  DETAIL MESSAGE ON SECURE  VOICE MAIL patient. Result GIVEN. ANY QUESTION MAY CALL BACK

## 2015-12-10 ENCOUNTER — Other Ambulatory Visit: Payer: Self-pay | Admitting: Cardiology

## 2015-12-10 NOTE — Telephone Encounter (Signed)
REFILL 

## 2015-12-20 ENCOUNTER — Other Ambulatory Visit: Payer: Self-pay | Admitting: Family Medicine

## 2015-12-21 NOTE — Telephone Encounter (Signed)
Ok to refill norvasc for 1 year.  Also, on review of chart recent cardiology notes state that statin was discontinued by PCP - which is not the case to my knowledge. My recent notes state to continue the statin.  Please clarify with pt whether or not he is taking his statin and if he has any concerns regarding this as I did not advise him to stop. Thanks.

## 2015-12-22 NOTE — Telephone Encounter (Signed)
Rx refill sent for Amlodipine.  Patient informed of the message below and states he is still taking Pravastatin 40mg  a day.

## 2016-01-05 ENCOUNTER — Ambulatory Visit (INDEPENDENT_AMBULATORY_CARE_PROVIDER_SITE_OTHER): Payer: Medicare Other | Admitting: Family Medicine

## 2016-01-05 ENCOUNTER — Encounter: Payer: Self-pay | Admitting: Family Medicine

## 2016-01-05 VITALS — BP 136/70 | HR 74 | Temp 98.2°F | Ht 70.0 in | Wt 209.3 lb

## 2016-01-05 DIAGNOSIS — I2583 Coronary atherosclerosis due to lipid rich plaque: Secondary | ICD-10-CM

## 2016-01-05 DIAGNOSIS — N3281 Overactive bladder: Secondary | ICD-10-CM

## 2016-01-05 DIAGNOSIS — D649 Anemia, unspecified: Secondary | ICD-10-CM | POA: Insufficient documentation

## 2016-01-05 DIAGNOSIS — E1165 Type 2 diabetes mellitus with hyperglycemia: Secondary | ICD-10-CM

## 2016-01-05 DIAGNOSIS — E785 Hyperlipidemia, unspecified: Secondary | ICD-10-CM

## 2016-01-05 DIAGNOSIS — IMO0002 Reserved for concepts with insufficient information to code with codable children: Secondary | ICD-10-CM

## 2016-01-05 DIAGNOSIS — I2510 Atherosclerotic heart disease of native coronary artery without angina pectoris: Secondary | ICD-10-CM

## 2016-01-05 DIAGNOSIS — I5032 Chronic diastolic (congestive) heart failure: Secondary | ICD-10-CM

## 2016-01-05 DIAGNOSIS — E1142 Type 2 diabetes mellitus with diabetic polyneuropathy: Secondary | ICD-10-CM

## 2016-01-05 DIAGNOSIS — I10 Essential (primary) hypertension: Secondary | ICD-10-CM | POA: Diagnosis not present

## 2016-01-05 DIAGNOSIS — E1151 Type 2 diabetes mellitus with diabetic peripheral angiopathy without gangrene: Secondary | ICD-10-CM

## 2016-01-05 NOTE — Progress Notes (Signed)
HPI:  Diabetes w peripheral circulatory disorder - Last A1c 7 10/14/15 -managed by endocrinology -meds: metformin1000bid, Levimir 20, novolog 6,8,8 -eye exam: utd -foot exam: utd -denies:hypoglycemia, vision changes   Essential hypertension - stable -meds: norvasc 5, doxasosin 4, lasix 40,  -denies: CP, SOB, DOE -BP great today  CAD, Chronic diastolic congestive heart failure - 2-D echo on 08/26/14 showed EF 60-65% with grade 1 diastolic dysfunction. -cath 2015 30% LM, 40-50%LAD  -meds: asa, statin, lasix - denies swelling, SOB, DOE   OAB (overactive bladder) -meds: Cardura  -stable   Hyperlipemia - stable - meds: Pravastatin  Anemia: -in hospital and mild in the past on review cardiology records -he denies any melena, weight loss, fevers, chronic fatigue improved significantly after holding naproxen  ROS: See pertinent positives and negatives per HPI.  Past Medical History  Diagnosis Date  . Diabetes mellitus     type 2 with peripheral neuropathy  . CAD (coronary artery disease)   . Atherosclerotic PVD with intermittent claudication ([place])   . Diastolic CHF, chronic ([place])   . HTN (hypertension)   . Dyslipidemia   . DVT (deep venous thrombosis) ([place])   . Overactive bladder     Hx: of stress incontinence  . IBS (irritable bowel syndrome)     Hx: of  . Pneumonia 2002    history of  . Varicose veins of lower extremities with other complications 0000000  . GERD (gastroesophageal reflux disease)     hx hiatal hernia  . Shingles   . Edema 08/19/2014  . Diverticulosis   . Osteoarthritis   . Anemia     Past Surgical History  Procedure Laterality Date  . Wrist tendon surgery    . Cts bilateral    . Knee arthroscopy  1988, 1992    bilateral  . Total knee arthroplasty  2005, 2007  . Lumbar left arm  1961  . Tonsillectomy    . Back surgery    . Colonoscopy      Hx: of  . Laminectomy  08/18/2013    DECOMPRESSIVE LAMINECTOMIES  . Lumbar  laminectomy/decompression microdiscectomy N/A 08/18/2013    Procedure: Lumbar two-three, lumbar three-four, lumbar four-five decompressive laminectomies;  Surgeon: [name], MD;  Location: [place] [facility];  Service: Neurosurgery;  Laterality: N/A;  . Left and right heart catheterization with coronary angiogram N/A 09/03/2014    Procedure: LEFT AND RIGHT HEART CATHETERIZATION WITH CORONARY ANGIOGRAM;  Surgeon: [name], MD;  Location: [facility];  Service: Cardiovascular;  Laterality: N/A;  . Cataract extraction Bilateral 08/10/2015,08/15/2015    Family History  Problem Relation Age of Onset  . Other Mother 55    CABG  . Heart disease Mother   . Hypertension Mother   . Hyperlipidemia Son     Social History   Social History  . Marital Status: Married    Spouse Name: N/A  . Number of Children: 2  . Years of Education: N/A   Social History Main Topics  . Smoking status: Never Smoker   . Smokeless tobacco: Never Used  . Alcohol Use: No  . Drug Use: No  . Sexual Activity: Not Asked   Other Topics Concern  . 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  acetaminophen (TYLENOL) 500 MG tablet, Take 500 mg by mouth 2 (two) times daily., Disp: , Rfl:  .  amLODipine (NORVASC) 5 MG tablet, TAKE ONE TABLET BY MOUTH DAILY, Disp: 90 tablet, Rfl: 3 .  aspirin 81 MG tablet, Take 81 mg by mouth daily. , Disp: , Rfl:  .  doxazosin (CARDURA) 4 MG tablet, TAKE ONE TABLET BY MOUTH DAILY, Disp: 90 tablet, Rfl: 2 .  furosemide (LASIX) 40 MG tablet, Take 1 tablet (40 mg total) by mouth daily., Disp: 30 tablet, Rfl: 11 .  Insulin Detemir (LEVEMIR FLEXTOUCH) 100  UNIT/ML Pen, INJECT 20 UNITS INTO THE SKIN DAILY AT 10 PM., Disp: 15 mL, Rfl: 2 .  metFORMIN (GLUCOPHAGE) 1000 MG tablet, Take 1 tablet (1,000 mg total) by mouth 2 (two) times daily with a meal., Disp: 180 tablet, Rfl: 3 .  Multiple Vitamins-Minerals (CENTRUM SILVER PO), Take 1 tablet by mouth daily. , Disp: , Rfl:  .  NOVOLOG FLEXPEN 100 UNIT/ML FlexPen, INJECT 8 TO 12 UNITS 3 TIMES DAILY WITH MEALS AS DIRECTED., Disp: 15 mL, Rfl: 1 .  omeprazole (PRILOSEC) 20 MG capsule, TAKE 1 CAPSULE (20 MG TOTAL) BY MOUTH DAILY., Disp: 90 capsule, Rfl: 1 .  ONETOUCH VERIO test strip, USE TO TEST BLOOD SUGAR 3 TIMES DAILY AS INSTRUCTED., Disp: 100 each, Rfl: 4 .  pravastatin (PRAVACHOL) 40 MG tablet, TAKE 1 TABLET (40 MG TOTAL) BY MOUTH EVERY EVENING., Disp: 30 tablet, Rfl: 3 .  Propylene Glycol (SYSTANE BALANCE) 0.6 % SOLN, Apply to eye. 2 drops each eye once daily, Disp: , Rfl:   EXAM:  Filed Vitals:   01/05/16 0808  BP: 136/70  Pulse: 74  Temp: 98.2 F (36.8 C)    Body mass index is 30.03 kg/(m^2).  GENERAL: vitals reviewed and listed above, alert, oriented, appears well hydrated and in no acute distress  HEENT: atraumatic, conjunttiva clear, no obvious abnormalities on inspection of external nose and ears  NECK: no obvious masses on inspection  LUNGS: clear to auscultation bilaterally, no wheezes, rales or rhonchi, good air movement  CV: HRRR, no peripheral edema  MS: moves all extremities without noticeable abnormality  PSYCH: pleasant and cooperative, no obvious depression or anxiety  ASSESSMENT AND PLAN:  Discussed the following assessment and plan:  Uncontrolled type 2 diabetes mellitus with peripheral circulatory disorder (HCC)  Hyperlipemia  Diabetic peripheral neuropathy associated with type 2 diabetes mellitus ([place])  Essential hypertension  Coronary artery disease due to lipid rich plaque  Chronic diastolic congestive heart failure (HCC)  OAB (overactive  bladder)  Anemia, unspecified anemia type  -doing well - not getting much activity -discussed options and he may start wide stationary bike at [place] starting with 5-10 minutes, then gradually working his way up -fall precautions -he opted to hold off on labs until next visit as is not fastinh -Patient advised to return or notify a doctor immediately if symptoms worsen or persist or new concerns arise.  Patient Instructions  BEFORE YOU LEAVE: -schedule Medicare Wellness Exam in 3 months - please come fasting for 8 hours and we will plan to do labs - PLEASE DRINK PLENTY of water   We recommend the following healthy lifestyle measures: - eat a healthy whole foods diet consisting of regular small meals composed of  vegetables, fruits, beans, nuts, seeds, healthy meats such as white chicken and fish and whole grains.  - avoid sweets, white starchy foods, fried foods, fast food, processed foods, sodas, red meet and other fattening foods.  - get a least 150-300 minutes of aerobic exercise per week.       Bobby Benton R.

## 2016-01-05 NOTE — Progress Notes (Signed)
Pre visit review using our clinic review tool, if applicable. No additional management support is needed unless otherwise documented below in the visit note. 

## 2016-01-05 NOTE — Patient Instructions (Signed)
BEFORE YOU LEAVE: -schedule Medicare Wellness Exam in 3 months - please come fasting for 8 hours and we will plan to do labs - PLEASE DRINK PLENTY of water   We recommend the following healthy lifestyle measures: - eat a healthy whole foods diet consisting of regular small meals composed of vegetables, fruits, beans, nuts, seeds, healthy meats such as white chicken and fish and whole grains.  - avoid sweets, white starchy foods, fried foods, fast food, processed foods, sodas, red meet and other fattening foods.  - get a least 150-300 minutes of aerobic exercise per week.

## 2016-01-08 ENCOUNTER — Other Ambulatory Visit: Payer: Self-pay | Admitting: Internal Medicine

## 2016-01-14 ENCOUNTER — Encounter: Payer: Self-pay | Admitting: Internal Medicine

## 2016-01-14 ENCOUNTER — Ambulatory Visit (INDEPENDENT_AMBULATORY_CARE_PROVIDER_SITE_OTHER): Payer: Medicare Other | Admitting: Internal Medicine

## 2016-01-14 VITALS — BP 118/66 | HR 71 | Temp 98.1°F | Ht 67.25 in | Wt 209.1 lb

## 2016-01-14 DIAGNOSIS — E1165 Type 2 diabetes mellitus with hyperglycemia: Secondary | ICD-10-CM | POA: Diagnosis not present

## 2016-01-14 DIAGNOSIS — E1151 Type 2 diabetes mellitus with diabetic peripheral angiopathy without gangrene: Secondary | ICD-10-CM | POA: Diagnosis not present

## 2016-01-14 DIAGNOSIS — IMO0002 Reserved for concepts with insufficient information to code with codable children: Secondary | ICD-10-CM

## 2016-01-14 LAB — POCT GLYCOSYLATED HEMOGLOBIN (HGB A1C): HEMOGLOBIN A1C: 7.8

## 2016-01-14 MED ORDER — INSULIN DETEMIR 100 UNIT/ML FLEXPEN: PEN_INJECTOR | SUBCUTANEOUS | Status: DC

## 2016-01-14 NOTE — Progress Notes (Signed)
Subjective:     Patient ID: [name], male   DOB: [birth year], 82 y.o.   MRN: [id number]  HPI [name] is a very pleasant 82 y.o. man returning for management of DM2, dx 07/2012, uncontrolled, insulin-dependent, with complications (peripheral neuropathy, PAD). Last visit 3 mo ago.  Still using the walker after his fall in 09/2015 >> still weakness in legs.  Sugars higher over the Holidays >> just started to watch his diet again.  Last HbA1C was: Lab Results  Component Value Date   HGBA1C 7.0 10/14/2015   HGBA1C 7.7 07/14/2015   HGBA1C 8.9* 04/13/2015   He is on: - Metformin 1000 mg 2x a day - Levemir 20 units at bedtime - Humalog 15 min before a meal:  6 units before breakfast   8 units before lunch  8 units before dinner Please add 2 units if you have a larger meal. Please subtract 2 units if you plan to be active after a meal.  Pt again brings a great log, he checks sugars ~2-4x a day >> slightly higher: - am: 159-215, 236 >>176-210 >> 131-160 >> 112, 120, 140-190 >> 103-140, 160 >> 94-137, 150 >> 109-146, 171 - 2h after b'fast: 123-179 >> 191-239 >> 114-187 >> 84, 90-140 >> 140-180 >> 104-156 >> 119-170 >>136-162 - before lunch: 150-160 >> 93-166, 228 >> 100-150 >> 142-170, 215 >> 97-148 >> 92-140 >> 85, 123-160 - 2h after lunch: 184-214 >> 94-177, 196 >> 133-160 >> n/c >> 110, 130 >> 138-160 >> 140-183 >> 123-180 - dinner:152, 210 >> 136 >> 81-162 >> 139, 140, 176 >> 105-220 >> 108-130, 183 >> 104-174, 201>> 114-180 - 2h after dinner:197-230 >> 97, 104-186, 2x200 >> 92-161 >> 120-225 >> 107-180 >> 150-170 >> 140-160, 180 - bedtime: 142-250 >> 124, 176-248 >> 108-164 >> 138, 139 >> 115-220 >> 105-161 >> 116-184 >> 130-175 Had lows in am (47-60s) >> 85.  - no CKD. Last BUN/Cr: Lab Results  Component Value Date   BUN 14 11/24/2015   CREATININE 0.81 11/24/2015  Not on an ACEI. - last lipid panel: Lab Results  Component Value Date   CHOL 99 03/26/2015   HDL  47.60 03/26/2015   LDLCALC 43 03/26/2015   TRIG 44.0 03/26/2015   CHOLHDL 2 03/26/2015  Off Pravastatin. - He has peripheral neuropathy. He is taking a B complex vitamin, and has a neuropathy cream. [name] at [facility].  - Last eye exam 07/26/2015: no DR (Dr. Alois Cliche). Cataract Sx in 08/2015.  - On ASA 81.  He had a heart catheterization on 09/03/2014 >> 2 small Blockages >> no intervention other than statins. 2D ECHO with normal EF.  In 08/2015 he was hospitalized for ?sepsis with strep pneumoniae after acquiring left lower lobe pneumonia.   I reviewed pt's medications, allergies, PMH, social hx, family hx, and changes were documented in the history of present illness. Otherwise, unchanged from my initial visit note.   Review of Systems Constitutional: no weight gain, no fatigue, no subjective hyperthermia/hypothermia Eye: no blurry vision, no xerophthalmia ENT: no sore throat, no dysphagia/odynophagia/hoarseness Cardiovascular: no CP/SOB/no palpitations/leg swelling Respiratory: no cough/SOB Gastrointestinal: no N/V/+ D/no C Musculoskeletal: no muscle/joint aches Skin: no rash Neuro: no tremors; + weakness in legs   Objective:   Physical Exam BP 118/66 mmHg  Pulse 71  Temp(Src) 98.1 F (36.7 C) (Oral)  Ht 5' 7.25" (1.708 m)  Wt 209 lb 2 oz (94.858 kg)  BMI 32.52 kg/m2  SpO2 97% Body mass index is 32.52 kg/(m^2).  Wt Readings from Last 3 Encounters:  01/14/16 209 lb 2 oz (94.858 kg)  01/05/16 209 lb 4.8 oz (94.938 kg)  11/17/15 204 lb 6.4 oz (92.715 kg)  Constitutional: normal weight, in NAD Eyes: PERRLA, EOMI, no exophthalmos ENT: moist mucous membranes, no thyromegaly, no cervical lymphadenopathy Cardiovascular: RRR, + SEM 2/6, no RG, + leg edema R>L Respiratory: CTA B Gastrointestinal: abdomen soft, NT, ND, BS+ Skin: moist, warm, no rashes Neurological: no tremor with outstretched hands  Assessment:     1. DM2, non-insulin-dependent,  uncontrolled, with complications - target 123XX123 for him: 7-7.5% - peripheral neuropathy - PAD Dr. [name]  Plan:     1. Pt with long standing Diabetes mellitus type 2, with improved control after adding mealtime insulin. Last hemoglobin A1c was improved, at 7.0%, but sugars are a little higher now >> will increase Levemir. - I advised him to: Patient Instructions  Please continue: - Metformin 1000 mg 2x a day - NovoLog 15 min before a meal:  6 units before breakfast   8 units before lunch  8 units before dinner Please add 2 units if you have a larger meal. Please subtract 2 units if you plan to be active after a meal.  Increase: - Levemir to 22 units at bedtime  Please come back for a follow-up appointment in 3 months.  - he is very conscientious about taking his medicines and keeping a good CBG log >> continue checking CBGs 3x a day.  - up to date with eye exams and flu shots - Will check HbA1c today >> 7.8% (higher!) - RTC in 3 mo with sugar log.

## 2016-01-14 NOTE — Progress Notes (Signed)
Pre visit review using our clinic review tool, if applicable. No additional management support is needed unless otherwise documented below in the visit note. 

## 2016-01-14 NOTE — Patient Instructions (Addendum)
Please continue: - Metformin 1000 mg 2x a day - NovoLog 15 min before a meal:  6 units before breakfast   8 units before lunch  8 units before dinner Please add 2 units if you have a larger meal. Please subtract 2 units if you plan to be active after a meal.  Increase: - Levemir to 22 units at bedtime  Please come back for a follow-up appointment in 3 months.

## 2016-02-13 ENCOUNTER — Other Ambulatory Visit: Payer: Self-pay | Admitting: Internal Medicine

## 2016-02-22 NOTE — Progress Notes (Signed)
HPI: FU edema and chest pain. ABIs 3/14 normal. Seen previously for chest pain, dyspnea and lower ext edema. Echocardiogram September 2015 showed normal LV function, grade 1 diastolic dysfunction and mild mitral regurgitation. Moderate left atrial enlargement. Cardiac catheterization September 2015 showed a 30% left main, 40-50% LAD and no obstructive disease in the circumflex or right coronary artery. PCWP 4. PA pressure 19 over 4. Since last seen, He has some dyspnea with moderate activities but not routine activities. No orthopnea, PND, pedal edema, exertional chest pain, palpitations or syncope.  Current Outpatient Prescriptions  Medication Sig Dispense Refill  . acetaminophen (TYLENOL) 500 MG tablet Take 500 mg by mouth 2 (two) times daily.    Marland Kitchen amLODipine (NORVASC) 5 MG tablet TAKE ONE TABLET BY MOUTH DAILY 90 tablet 3  . aspirin 81 MG tablet Take 81 mg by mouth daily.     Marland Kitchen doxazosin (CARDURA) 4 MG tablet TAKE ONE TABLET BY MOUTH DAILY 90 tablet 2  . furosemide (LASIX) 40 MG tablet Take 1 tablet (40 mg total) by mouth daily. 30 tablet 11  . glucose blood (ONETOUCH VERIO) test strip USE TO TEST BLOOD SUGAR 3 TIMES DAILY AS INSTRUCTED. DX: E11.51 100 each 5  . Insulin Detemir (LEVEMIR FLEXTOUCH) 100 UNIT/ML Pen INJECT 22 UNITS INTO THE SKIN DAILY AT 10 PM. 15 mL 2  . metFORMIN (GLUCOPHAGE) 1000 MG tablet Take 1 tablet (1,000 mg total) by mouth 2 (two) times daily with a meal. 180 tablet 3  . Multiple Vitamins-Minerals (CENTRUM SILVER PO) Take 1 tablet by mouth daily.     Marland Kitchen omeprazole (PRILOSEC) 20 MG capsule TAKE 1 CAPSULE (20 MG TOTAL) BY MOUTH DAILY. 90 capsule 1  . ONETOUCH VERIO test strip USE TO TEST BLOOD SUGAR 3 TIMES DAILY AS INSTRUCTED. 100 each 4  . pravastatin (PRAVACHOL) 40 MG tablet TAKE 1 TABLET (40 MG TOTAL) BY MOUTH EVERY EVENING. 30 tablet 3  . Propylene Glycol (SYSTANE BALANCE) 0.6 % SOLN Apply to eye. 2 drops each eye once daily     No current facility-administered  medications for this visit.     Past Medical History  Diagnosis Date  . Diabetes mellitus     type 2 with peripheral neuropathy  . CAD (coronary artery disease)   . Atherosclerotic PVD with intermittent claudication ([place])   . Diastolic CHF, chronic ([place])   . HTN (hypertension)   . Dyslipidemia   . DVT (deep venous thrombosis) ([place])   . Overactive bladder     Hx: of stress incontinence  . IBS (irritable bowel syndrome)     Hx: of  . Pneumonia 2002    history of  . Varicose veins of lower extremities with other complications 0000000  . GERD (gastroesophageal reflux disease)     hx hiatal hernia  . Shingles   . Edema 08/19/2014  . Diverticulosis   . Osteoarthritis   . Anemia     Past Surgical History  Procedure Laterality Date  . Wrist tendon surgery    . Cts bilateral    . Knee arthroscopy  1988, 1992    bilateral  . Total knee arthroplasty  2005, 2007  . Lumbar left arm  1961  . Tonsillectomy    . Back surgery    . Colonoscopy      Hx: of  . Laminectomy  08/18/2013    DECOMPRESSIVE LAMINECTOMIES  . Lumbar laminectomy/decompression microdiscectomy N/A 08/18/2013    Procedure: Lumbar two-three, lumbar three-four, lumbar four-five  decompressive laminectomies;  Surgeon: [name], MD;  Location: [facility];  Service: Neurosurgery;  Laterality: N/A;  . Left and right heart catheterization with coronary angiogram N/A 09/03/2014    Procedure: LEFT AND RIGHT HEART CATHETERIZATION WITH CORONARY ANGIOGRAM;  Surgeon: [name], MD;  Location: [facility] CATH LAB;  Service: Cardiovascular;  Laterality: N/A;  . Cataract extraction Bilateral 08/10/2015,08/15/2015    Social History   Social History  . Marital Status: Married    Spouse Name: N/A  . Number of Children: 2  . Years of Education: N/A   Occupational History  . Not on file.   Social History Main Topics  . Smoking status: Never Smoker   . Smokeless tobacco: Never Used  . Alcohol Use: No  . Drug Use: No    . Sexual Activity: Not on file   Other Topics Concern  . 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 Other Mother 60    CABG  . Heart disease Mother   . Hypertension Mother   . Hyperlipidemia Son     ROS: Problems with balance and arthralgias but no fevers or chills, productive cough, hemoptysis, dysphasia, odynophagia, melena, hematochezia, dysuria, hematuria, rash, seizure activity, orthopnea, PND, pedal edema, claudication. Remaining systems are negative.  Physical Exam: Well-developed well-nourished in no acute distress.  Skin is warm and dry.  HEENT is normal.  Neck is supple.  Chest is clear to auscultation with normal expansion.  Cardiovascular exam is regular rate and rhythm.  Abdominal exam nontender or distended. No masses palpated. Extremities show no edema. neuro grossly intact   Electrocardiogram shows sinus rhythm, left anterior fascicular block and no ST changes.

## 2016-02-23 ENCOUNTER — Ambulatory Visit (INDEPENDENT_AMBULATORY_CARE_PROVIDER_SITE_OTHER): Payer: Medicare Other | Admitting: Cardiology

## 2016-02-23 ENCOUNTER — Encounter: Payer: Self-pay | Admitting: Cardiology

## 2016-02-23 VITALS — BP 112/57 | HR 65 | Ht 70.0 in | Wt 208.0 lb

## 2016-02-23 DIAGNOSIS — E785 Hyperlipidemia, unspecified: Secondary | ICD-10-CM | POA: Diagnosis not present

## 2016-02-23 DIAGNOSIS — I2510 Atherosclerotic heart disease of native coronary artery without angina pectoris: Secondary | ICD-10-CM | POA: Diagnosis not present

## 2016-02-23 DIAGNOSIS — I2583 Coronary atherosclerosis due to lipid rich plaque: Secondary | ICD-10-CM

## 2016-02-23 DIAGNOSIS — I10 Essential (primary) hypertension: Secondary | ICD-10-CM

## 2016-02-23 DIAGNOSIS — I5032 Chronic diastolic (congestive) heart failure: Secondary | ICD-10-CM | POA: Diagnosis not present

## 2016-02-23 NOTE — Patient Instructions (Signed)
Your physician wants you to follow-up in: ONE YEAR WITH DR CRENSHAW You will receive a reminder letter in the mail two months in advance. If you don't receive a letter, please call our office to schedule the follow-up appointment.   If you need a refill on your cardiac medications before your next appointment, please call your pharmacy.  

## 2016-02-23 NOTE — Assessment & Plan Note (Signed)
Continue statin. 

## 2016-02-23 NOTE — Assessment & Plan Note (Signed)
Continue aspirin and statin. 

## 2016-02-23 NOTE — Assessment & Plan Note (Signed)
Patient is euvolemic on examination.Continue present dose of Lasix. 

## 2016-02-23 NOTE — Assessment & Plan Note (Signed)
Blood pressure controlled. Continue present medications. 

## 2016-02-26 ENCOUNTER — Other Ambulatory Visit: Payer: Self-pay | Admitting: Internal Medicine

## 2016-02-28 ENCOUNTER — Other Ambulatory Visit: Payer: Self-pay

## 2016-02-29 ENCOUNTER — Other Ambulatory Visit: Payer: Self-pay | Admitting: *Deleted

## 2016-02-29 MED ORDER — INSULIN LISPRO 100 UNIT/ML (KWIKPEN): PEN_INJECTOR | SUBCUTANEOUS | Status: DC

## 2016-03-01 ENCOUNTER — Other Ambulatory Visit: Payer: Self-pay | Admitting: Cardiology

## 2016-03-01 ENCOUNTER — Other Ambulatory Visit: Payer: Self-pay | Admitting: Internal Medicine

## 2016-03-01 NOTE — Telephone Encounter (Signed)
Rx(s) sent to pharmacy electronically.  

## 2016-03-17 ENCOUNTER — Other Ambulatory Visit: Payer: Self-pay | Admitting: Internal Medicine

## 2016-03-17 ENCOUNTER — Other Ambulatory Visit: Payer: Self-pay | Admitting: Family Medicine

## 2016-04-06 ENCOUNTER — Other Ambulatory Visit: Payer: Self-pay | Admitting: Family Medicine

## 2016-04-12 ENCOUNTER — Other Ambulatory Visit (INDEPENDENT_AMBULATORY_CARE_PROVIDER_SITE_OTHER): Payer: Medicare Other | Admitting: *Deleted

## 2016-04-12 ENCOUNTER — Encounter: Payer: Self-pay | Admitting: Internal Medicine

## 2016-04-12 ENCOUNTER — Ambulatory Visit (INDEPENDENT_AMBULATORY_CARE_PROVIDER_SITE_OTHER): Payer: Medicare Other | Admitting: Internal Medicine

## 2016-04-12 VITALS — BP 122/60 | HR 71 | Temp 97.6°F | Resp 12 | Wt 212.0 lb

## 2016-04-12 DIAGNOSIS — E1151 Type 2 diabetes mellitus with diabetic peripheral angiopathy without gangrene: Secondary | ICD-10-CM | POA: Diagnosis not present

## 2016-04-12 DIAGNOSIS — E1165 Type 2 diabetes mellitus with hyperglycemia: Secondary | ICD-10-CM

## 2016-04-12 DIAGNOSIS — IMO0002 Reserved for concepts with insufficient information to code with codable children: Secondary | ICD-10-CM

## 2016-04-12 LAB — POCT GLYCOSYLATED HEMOGLOBIN (HGB A1C): Hemoglobin A1C: 7.4

## 2016-04-12 MED ORDER — INSULIN DETEMIR 100 UNIT/ML FLEXPEN
PEN_INJECTOR | SUBCUTANEOUS | Status: DC
Start: 2016-04-12 — End: 2016-07-18

## 2016-04-12 NOTE — Progress Notes (Signed)
Subjective:     Patient ID: [name], male   DOB: [birth year], 82 y.o.   MRN: [id number]  HPI [name] is a very pleasant 82 y.o. man returning for management of DM2, dx 07/2012, uncontrolled, insulin-dependent, with complications (peripheral neuropathy, PAD). Last visit 3 mo ago.  Still using a walker after his fall in 09/2015 >> still weakness in legs.  Sugars higher over the Holidays >> sugars higher >> now improving.  Last HbA1C was: Lab Results  Component Value Date   HGBA1C 7.8 01/14/2016   HGBA1C 7.0 10/14/2015   HGBA1C 7.7 07/14/2015   He is on: - Metformin 1000 mg 2x a day - Levemir 20 >> 22 units at bedtime (increased 01/2016) - Humalog 15 min before a meal:  6 units before a regular meal 8 units before a larger meal Please subtract 2 units if you plan to be active after a meal.  Pt again brings a great log, he checks sugars ~2-4x a day >> better: - am: 112, 120, 140-190 >> 103-140, 160 >> 94-137, 150 >> 109-146, 171 >> 75 (if no bedtime snack)-145, 161 - 2h after b'fast: 191-239 >> 114-187 >> 84, 90-140 >> 140-180 >> 104-156 >> 119-170 >>136-162 >> 61, 110-170, 180 - before lunch: 93-166, 228 >> 100-150 >> 142-170, 215 >> 97-148 >> 92-140 >> 85, 123-160 >> 91-150, 177 - 2h after lunch: 94-177, 196 >> 133-160 >> n/c >> 110, 130 >> 138-160 >> 140-183 >> 123-180 >> 105-170 - dinner:136 >> 81-162 >> 139, 140, 176 >> 105-220 >> 108-130, 183 >> 104-174, 201>> 114-180 >> 78, 88-175 - 2h after dinner: 97, 104-186, 2x200 >> 92-161 >> 120-225 >> 107-180 >> 150-170 >> 140-160, 180 >> 135, 149-165, 185 - bedtime: 124, 176-248 >> 108-164 >> 138, 139 >> 115-220 >> 105-161 >> 116-184 >> 130-175 >> 125-170, 216 Had lows in am (47-60s) >> not anymore >> 111, 205  - no CKD. Last BUN/Cr: Lab Results  Component Value Date   BUN 14 11/24/2015   CREATININE 0.81 11/24/2015  Not on an ACEI. - last lipid panel: Lab Results  Component Value Date   CHOL 99 03/26/2015   HDL 47.60  03/26/2015   LDLCALC 43 03/26/2015   TRIG 44.0 03/26/2015   CHOLHDL 2 03/26/2015  Off Pravastatin. - He has peripheral neuropathy. He is taking a B complex vitamin, and has a neuropathy cream. Seeing [name] at the [facility].  - Last eye exam 11/2015: no DR (Dr. Alois Cliche). Cataract Sx in 08/2015.  - On ASA 81.  He had a heart catheterization on 09/03/2014 >> 2 small Blockages >> no intervention other than statins. 2D ECHO with normal EF.  In 08/2015 he was hospitalized for ?sepsis with strep pneumoniae after acquiring left lower lobe pneumonia.   I reviewed pt's medications, allergies, PMH, social hx, family hx, and changes were documented in the history of present illness. Otherwise, unchanged from my initial visit note. He started on Myrbetriq.  Review of Systems Constitutional: no weight gain, no fatigue, no subjective hyperthermia/hypothermia Eye: no blurry vision, no xerophthalmia ENT: no sore throat, no dysphagia/odynophagia/hoarseness Cardiovascular: no CP/SOB/no palpitations/leg swelling Respiratory: no cough/SOB Gastrointestinal: no N/V/D/C Musculoskeletal: no muscle/joint aches Skin: no rash Neuro: no tremors; + weakness in legs   Objective:   Physical Exam BP 122/60 mmHg  Pulse 71  Temp(Src) 97.6 F (36.4 C) (Oral)  Resp 12  Wt 212 lb (96.163 kg)  SpO2 96% Body mass index is 30.42 kg/(m^2).  Wt Readings from Last 3 Encounters:  02/23/16 208 lb (94.348 kg)  01/14/16 209 lb 2 oz (94.858 kg)  01/05/16 209 lb 4.8 oz (94.938 kg)  Constitutional: normal weight, in NAD Eyes: PERRLA, EOMI, no exophthalmos ENT: moist mucous membranes, no thyromegaly, no cervical lymphadenopathy Cardiovascular: RRR, + SEM 2/6, no RG, + leg edema R>L Respiratory: CTA B Gastrointestinal: abdomen soft, NT, ND, BS+ Skin: moist, warm, no rashes Neurological: no tremor with outstretched hands  Assessment:     1. DM2, insulin-dependent, uncontrolled, with complications - target [id number]  for him: 7-7.5% - peripheral neuropathy - PAD Dr. [name]  Plan:     1. Pt with long standing Diabetes mellitus type 2, with improved control after adding mealtime insulin, but with higher CBG and HbA1c at last visit, after the Holidays. We increase Levemir at that time. Sugars better. He tells me his sugars are lower in am if he does not eat a snack at bedtime >> will decrease his Levemir back to 20 units so that he does not have to eat a snack. - I advised him to: Patient Instructions  Please continue: - Metformin 1000 mg 2x a day - Humalog 15 min before a meal:  6 units before a regular meal 8 units before a larger meal Please subtract 2 units if you plan to be active after a meal.  Decrease Levemir to 20 units at bedtime.  Please come back for a follow-up appointment in 3 months.  - he is very conscientious about taking his medicines and keeping a good CBG log >> continue checking CBGs 3x a day.  - up to date with eye exams and flu shots - Will check HbA1c today >> 7.4% (lower!) - RTC in 3 mo with sugar log.

## 2016-04-12 NOTE — Patient Instructions (Addendum)
Please continue: - Metformin 1000 mg 2x a day - Humalog 15 min before a meal:  6 units before a regular meal 8 units before a larger meal Please subtract 2 units if you plan to be active after a meal.  Decrease Levemir to 20 units at bedtime.  Please come back for a follow-up appointment in 3 months.

## 2016-04-19 ENCOUNTER — Encounter: Payer: Medicare Other | Admitting: Family Medicine

## 2016-04-20 ENCOUNTER — Encounter: Payer: Self-pay | Admitting: Family Medicine

## 2016-04-20 ENCOUNTER — Ambulatory Visit (INDEPENDENT_AMBULATORY_CARE_PROVIDER_SITE_OTHER): Payer: Medicare Other | Admitting: Family Medicine

## 2016-04-20 VITALS — BP 132/62 | HR 88 | Temp 98.6°F | Ht 68.0 in | Wt 209.4 lb

## 2016-04-20 DIAGNOSIS — I5032 Chronic diastolic (congestive) heart failure: Secondary | ICD-10-CM

## 2016-04-20 DIAGNOSIS — E785 Hyperlipidemia, unspecified: Secondary | ICD-10-CM | POA: Diagnosis not present

## 2016-04-20 DIAGNOSIS — IMO0002 Reserved for concepts with insufficient information to code with codable children: Secondary | ICD-10-CM

## 2016-04-20 DIAGNOSIS — D649 Anemia, unspecified: Secondary | ICD-10-CM | POA: Diagnosis not present

## 2016-04-20 DIAGNOSIS — Z0000 Encounter for general adult medical examination without abnormal findings: Secondary | ICD-10-CM

## 2016-04-20 DIAGNOSIS — E1142 Type 2 diabetes mellitus with diabetic polyneuropathy: Secondary | ICD-10-CM

## 2016-04-20 DIAGNOSIS — E1151 Type 2 diabetes mellitus with diabetic peripheral angiopathy without gangrene: Secondary | ICD-10-CM

## 2016-04-20 DIAGNOSIS — I10 Essential (primary) hypertension: Secondary | ICD-10-CM | POA: Diagnosis not present

## 2016-04-20 DIAGNOSIS — M542 Cervicalgia: Secondary | ICD-10-CM | POA: Diagnosis not present

## 2016-04-20 DIAGNOSIS — I2510 Atherosclerotic heart disease of native coronary artery without angina pectoris: Secondary | ICD-10-CM

## 2016-04-20 DIAGNOSIS — I2583 Coronary atherosclerosis due to lipid rich plaque: Secondary | ICD-10-CM

## 2016-04-20 DIAGNOSIS — N3281 Overactive bladder: Secondary | ICD-10-CM

## 2016-04-20 DIAGNOSIS — E1165 Type 2 diabetes mellitus with hyperglycemia: Secondary | ICD-10-CM

## 2016-04-20 LAB — CBC
HCT: 34.8 % — ABNORMAL LOW (ref 39.0–52.0)
HEMOGLOBIN: 11.4 g/dL — AB (ref 13.0–17.0)
MCHC: 32.8 g/dL (ref 30.0–36.0)
MCV: 86.2 fl (ref 78.0–100.0)
PLATELETS: 161 10*3/uL (ref 150.0–400.0)
RBC: 4.03 Mil/uL — ABNORMAL LOW (ref 4.22–5.81)
RDW: 15.3 % (ref 11.5–15.5)
WBC: 6.3 10*3/uL (ref 4.0–10.5)

## 2016-04-20 LAB — BASIC METABOLIC PANEL
BUN: 23 mg/dL (ref 6–23)
CHLORIDE: 102 meq/L (ref 96–112)
CO2: 27 mEq/L (ref 19–32)
Calcium: 9.3 mg/dL (ref 8.4–10.5)
Creatinine, Ser: 0.86 mg/dL (ref 0.40–1.50)
GFR: 90.82 mL/min (ref >60.00)
Glucose, Bld: 135 mg/dL — ABNORMAL HIGH (ref 70–99)
POTASSIUM: 4.2 meq/L (ref 3.5–5.1)
Sodium: 138 mEq/L (ref 135–145)

## 2016-04-20 LAB — LIPID PANEL
CHOLESTEROL: 97 mg/dL (ref 0–200)
HDL: 35.4 mg/dL — AB (ref >39.00)
LDL CALC: 48 mg/dL (ref 0–99)
NonHDL: 61.86
TRIGLYCERIDES: 70 mg/dL (ref 0.0–149.0)
Total CHOL/HDL Ratio: 3
VLDL: 14 mg/dL (ref 0.0–40.0)

## 2016-04-20 LAB — VITAMIN B12: Vitamin B-12: 363 pg/mL (ref 211–911)

## 2016-04-20 NOTE — Progress Notes (Signed)
Medicare Annual Preventive Care Visit  (initial annual wellness or annual wellness exam)  Concerns and/or follow up today:  Diabetes w peripheral circulatory disorder  - Last A1c [id number] 7.4 -complications: peripheral neuropathy -managed by endocrinology  -meds: metformin1000bid, Levimir 20,  Mealtime insulin -eye exam: utd  -foot exam: due today, occasionally burning sensation in feet and fairly at night, seeing podiatry, wants to check b12 regarding his neuropathy -denies:hypoglycemia, vision changes   Essential hypertension  - stable  -meds: norvasc 5, doxasosin 4, lasix 40,  -denies: CP, SOB, DOE  -BP great today   CAD, Chronic diastolic congestive heart failure  - 2-D echo on 08/26/14 showed EF 60-65% with grade 1 diastolic dysfunction.  -cath 2015 30% LM, 40-50%LAD  -meds: asa, statin, lasix  - denies swelling, SOB, DOE   OAB (overactive bladder)  -meds: Cardura  -stable  -Sees [facility] u[facility]ist, Dr. Patsy Baltimore  Hyperlipemia  - stable  - meds: Pravastatin   Anemia:  -during hospital stay 2016 and mild in the past on review cardiology records - improving last check -he denies any melena, weight loss, fevers, chronic fatigue improved significantly after holding naproxen  R shoulder pain: -new issue, started about 2 weeks ago -achy pain in R neck, traps, radiating to upper R arm -feels pinched a nerve; very active with yard work,but can't think of injury -now much better with radiating symptoms resolved and neck/shoulde rpain resolving/mild -denies: weakness, numbness, paresthesia, fevers, malaise, ha   ROS: negative for report of fevers, unintentional weight loss, vision changes, vision loss, hearing loss or change, chest pain, sob, hemoptysis, melena, hematochezia, hematuria, genital discharge or lesions, falls, bleeding or bruising, loc, thoughts of suicide or self harm, memory loss  1.) Patient-completed health risk assessment  - completed and reviewed, see  scanned documentation  2.) Review of Medical History: -PMH, PSH, Family History and current specialty and care providers reviewed and updated and listed below  - see scanned in document in chart and below  Past Medical History  Diagnosis Date  . Diabetes mellitus     type 2 with peripheral neuropathy  . CAD (coronary artery disease)   . Atherosclerotic PVD with intermittent claudication ([place])   . Diastolic CHF, chronic ([place])   . HTN (hypertension)   . Dyslipidemia   . DVT (deep venous thrombosis) ([place])   . Overactive bladder     Hx: of stress incontinence  . IBS (irritable bowel syndrome)     Hx: of  . Pneumonia 2002    history of  . Varicose veins of lower extremities with other complications 0000000  . GERD (gastroesophageal reflux disease)     hx hiatal hernia  . Shingles   . Edema 08/19/2014  . Diverticulosis   . Osteoarthritis   . Anemia     Past Surgical History  Procedure Laterality Date  . Wrist tendon surgery    . Cts bilateral    . Knee arthroscopy  1988, 1992    bilateral  . Total knee arthroplasty  2005, 2007  . Lumbar left arm  1961  . Tonsillectomy    . Back surgery    . Colonoscopy      Hx: of  . Laminectomy  08/18/2013    DECOMPRESSIVE LAMINECTOMIES  . Lumbar laminectomy/decompression microdiscectomy N/A 08/18/2013    Procedure: Lumbar two-three, lumbar three-four, lumbar four-five decompressive laminectomies;  Surgeon: [name], MD;  Location: [facility];  Service: Neurosurgery;  Laterality: N/A;  . Left and right heart catheterization  with coronary angiogram N/A 09/03/2014    Procedure: LEFT AND RIGHT HEART CATHETERIZATION WITH CORONARY ANGIOGRAM;  Surgeon: [name], MD;  Location[facility] CATH LAB;  Service: Cardiovascular;  Laterality: N/A;  . Cataract extraction Bilateral 08/10/2015,08/15/2015    Social History   Social History  . Marital Status: Married    Spouse Name: N/A  . Number of Children: 2  . Years of Education: N/A    Occupational History  . Not on file.   Social History Main Topics  . Smoking status: Never Smoker   . Smokeless tobacco: Never Used  . Alcohol Use: No  . Drug Use: No  . Sexual Activity: Not on file   Other Topics Concern  . 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 Other Mother 34    CABG  . Heart disease Mother   . Hypertension Mother   . Hyperlipidemia Son     Current Outpatient Prescriptions on File Prior to Visit  Medication Sig Dispense Refill  . acetaminophen (TYLENOL) 500 MG tablet Take 500 mg by mouth 2 (two) times daily.    Marland Kitchen amLODipine (NORVASC) 5 MG tablet TAKE ONE TABLET BY MOUTH DAILY 90 tablet 3  . aspirin 81 MG tablet Take 81 mg by mouth daily.     . cholecalciferol (VITAMIN D) 1000 units tablet Take 2,000 Units by mouth daily.    Marland Kitchen doxazosin (CARDURA) 4 MG tablet TAKE ONE TABLET BY MOUTH DAILY 90 tablet 1  . furosemide (LASIX) 40 MG tablet Take 1 tablet (40 mg total) by mouth daily. 30 tablet 11  . glucose blood (ONETOUCH VERIO) test strip USE TO TEST BLOOD SUGAR 3 TIMES DAILY AS INSTRUCTED. DX: E11.51 100 each 5  . Insulin Detemir (LEVEMIR FLEXTOUCH) 100 UNIT/ML Pen INJECT 20 UNITS INTO THE SKIN DAILY AT 10 PM. 15 mL 2  . insulin lispro (HUMALOG KWIKPEN) 100 UNIT/ML KiwkPen INJECT 6 TO 8 UNITS BEFOR EACH MEAL 3 TIMES A DAY. 15 mL 2  . metFORMIN (GLUCOPHAGE) 1000 MG tablet TAKE 1 TABLET (1,000 MG TOTAL) BY MOUTH 2 (TWO) TIMES DAILY WITH A MEAL. 60 tablet 2  . Multiple Vitamins-Minerals (CENTRUM SILVER PO) Take 1 tablet by mouth daily.     Marland Kitchen MYRBETRIQ 50 MG TB24 tablet Take 50 mg by mouth at bedtime.     Marland Kitchen  omeprazole (PRILOSEC) 20 MG capsule TAKE 1 CAPSULE (20 MG TOTAL) BY MOUTH DAILY. 90 capsule 1  . ONETOUCH DELICA LANCETS 99991111 MISC USE TO TEST BLOOD SUGAR 3 TIMES A DAY AS INSTRUCTED. 300 each 2  . ONETOUCH VERIO test strip USE TO TEST BLOOD SUGAR 3 TIMES DAILY AS INSTRUCTED. 100 each 4  . pravastatin (PRAVACHOL) 40 MG tablet TAKE 1 TABLET (40 MG TOTAL) BY MOUTH EVERY EVENING. 30 tablet 11  . Propylene Glycol (SYSTANE BALANCE) 0.6 % SOLN Apply to eye. 2 drops each eye once daily     No current facility-administered medications on file prior to visit.     3.) Review of  functional ability and level of safety:  Any difficulty hearing?  NO  History of falling?  NO  Any trouble with IADLs - using a phone, using transportation, grocery shopping, preparing meals, doing housework, doing laundry, taking medications and managing money? NO  Advance Directives? Yes, does not wish to update  See summary of recommendations in Patient Instructions below.  4.) Physical Exam Filed Vitals:   04/20/16 0659  BP: 132/62  Pulse: 88  Temp: 98.6 F (37 C)   Estimated body mass index is 31.85 kg/(m^2) as calculated from the following:   Height as of this encounter: 5\' 8"  (1.727 m).   Weight as of this encounter: 209 lb 6.4 oz (94.983 kg).  EKG (optional): deferred  General: alert, appear well hydrated and in no acute distress  HEENT: visual acuity grossly intact  CV: HRRR, no peripheral edema  Lungs: CTA bilaterally, no rhonchi, rales or wheezing  Foot exam: done  Psych: pleasant and cooperative, no obvious depression or anxiety  Neuro/MS: Normal inspection of the head and neck with normal range of motion of the head and neck except for soft mobile subcutaneous nodule on R lower post neck (he reports this has been there unchanged for many years), TTP r trap upper fibers, no bony tenderness to palpation of the cervical or thoracic spine; normal strength, sensitivity to light touch and dtrs  in UEs bilaterally, neg spurling, normal radial pulses  Cognitive function grossly intact  See patient instructions for recommendations.  Education and counseling regarding the above review of health provided with a plan for the following: -see scanned patient completed form for further details -fall prevention strategies discussed  -healthy lifestyle discussed -importance and resources for completing advanced directives discussed -see patient instructions below for any other recommendations provided  4)The following written screening schedule of preventive measures were reviewed with assessment and plan made per below, orders and patient instructions:      AAA screening done if applicable     Alcohol screening done     Obesity Screening and counseling done     STI screening (Hep C if born 1943-65) offered and per pt wishes     Tobacco Screening done done       Pneumococcal (PPSV23 -one dose after 64, one before if risk factors), influenza yearly and hepatitis B vaccines (if high risk - end stage renal disease, IV drugs, homosexual men, live in home for mentally retarded, hemophilia receiving factors) ASSESSMENT/PLAN: done      Prostate cancer screening ASSESSMENT/PLAN: n/a, sees urologist      Colorectal cancer screening (FOBT yearly or flex sig q4y or colonoscopy q10y or barium enema q4y) ASSESSMENT/PLAN: utd or ordered      Diabetes outpatient self-management training services ASSESSMENT/PLAN: utd or done      Screening for glaucoma(q1y if high risk - diabetes, FH, AA and > 73 or hispanic and > 65) ASSESSMENT/PLAN: utd or advised      Medical nutritional therapy for individuals with diabetes or renal disease ASSESSMENT/PLAN: see orders      Cardiovascular screening blood tests (lipids q5y) ASSESSMENT/PLAN: see orders and labs      Diabetes screening tests ASSESSMENT/PLAN: see orders and labs   7.) Summary:   Medicare annual wellness visit, subsequent -risk  factors and conditions per above assessment were discussed and treatment, recommendations and referrals were offered per documentation above and orders and patient instructions. Neck pain  Uncontrolled type 2 diabetes mellitus with peripheral circulatory disorder (HCC) Diabetic peripheral neuropathy  associated with type 2 diabetes mellitus ([place]) -lifestyle recs, cont treatment per endocrine recs, check b12 per his request regarding neuropathy -foot exam done -foot care reviewed  Hyperlipemia - Plan: Lipid Panel -lifestyle recs  Essential hypertension - Plan: Basic metabolic panel -continue current treatment, BP ok today  Coronary artery disease due to lipid rich plaque Chronic diastolic congestive heart failure ([place]) -cont current tx, sees cardiologist  OAB (overactive bladder) -stable, sees urologist  Anemia, unspecified anemia type - Plan: CBC (no diff)  Neck/shoulder pain: -query cervical radiculopathy versus muscular vs other -seems to be resolving so we opted for conservative approach with tylenol prn, heat and HEP -he agrees to follow up if any new symptoms or not resolved entirely in then next few weeks  Patient Instructions  Before you leave: -Neck spasm exercises -Labs -Schedule follow-up in 3-4 months  Please do the exercises provided for the neck 3 days per week. Can use Tylenol and heat as needed for pain. If symptoms persist in 3-4 weeks please schedule a follow-up appointment. Follow-up sooner if symptoms are worsening or new symptoms develop.  We recommend the following healthy lifestyle measures: - eat a healthy whole foods diet consisting of regular small meals composed of vegetables, fruits, beans, nuts, seeds, healthy meats such as white chicken and fish and whole grains.  - avoid sweets, white starchy foods, fried foods, fast food, processed foods, sodas, red meet and other fattening foods.  - get a least 150-300 minutes of aerobic exercise per week.    -We have ordered labs or studies at this visit. It can take up to 1-2 weeks for results and processing. We will contact you with instructions IF your results are abnormal. Normal results will be released to your [facility]. If you have not heard from [place] or can not find your results in [facility] in 2 weeks please contact our office.  FOR YOUR DIABETES:  []  Eat a healthy low carb diet (avoid sweets, sweet drinks, breads, potatoes, rice, etc.) and ensure 3 small meals daily.  []  Get AT LEAST 150 minutes of cardiovascular exercise per week - 30 minutes per day is best of sustained sweaty exercise.  []  Take all of your medications every day as directed by your doctor. Call your doctor immediately if you have any questions about your medications or are running low.  []  Check your blood sugar often and when you feel unwell and keep a log to bring to all health appointments. FASTING: before you eat anything in the morning POSTPRANDIAL: 1-2 hours after a meal  []  If any low blood sugars < 70, eat a snack and call your doctor immediately.  []  See an eye doctor every year and fax your diabetic eye exam to our office.  Fax: [phone]  []  Take good care of your feet and keep them soft and callus free. Check your feet daily and wear comfortable shoes. See your doctor immediately if you have any cuts, calluses or wounds on your feet.

## 2016-04-20 NOTE — Patient Instructions (Signed)
Before you leave: -Neck spasm exercises -Labs -Schedule follow-up in 3-4 months  Please do the exercises provided for the neck 3 days per week. Can use Tylenol and heat as needed for pain. If symptoms persist in 3-4 weeks please schedule a follow-up appointment. Follow-up sooner if symptoms are worsening or new symptoms develop.  We recommend the following healthy lifestyle measures: - eat a healthy whole foods diet consisting of regular small meals composed of vegetables, fruits, beans, nuts, seeds, healthy meats such as white chicken and fish and whole grains.  - avoid sweets, white starchy foods, fried foods, fast food, processed foods, sodas, red meet and other fattening foods.  - get a least 150-300 minutes of aerobic exercise per week.   -We have ordered labs or studies at this visit. It can take up to 1-2 weeks for results and processing. We will contact you with instructions IF your results are abnormal. Normal results will be released to your [facility]. If you have not heard from [place] or can not find your results in [facility] in 2 weeks please contact our office.  FOR YOUR DIABETES:  []  Eat a healthy low carb diet (avoid sweets, sweet drinks, breads, potatoes, rice, etc.) and ensure 3 small meals daily.  []  Get AT LEAST 150 minutes of cardiovascular exercise per week - 30 minutes per day is best of sustained sweaty exercise.  []  Take all of your medications every day as directed by your doctor. Call your doctor immediately if you have any questions about your medications or are running low.  []  Check your blood sugar often and when you feel unwell and keep a log to bring to all health appointments. FASTING: before you eat anything in the morning POSTPRANDIAL: 1-2 hours after a meal  []  If any low blood sugars < 70, eat a snack and call your doctor immediately.  []  See an eye doctor every year and fax your diabetic eye exam to our office.  Fax: [phone]  []  Take good care of your  feet and keep them soft and callus free. Check your feet daily and wear comfortable shoes. See your doctor immediately if you have any cuts, calluses or wounds on your feet.

## 2016-06-20 ENCOUNTER — Other Ambulatory Visit: Payer: Self-pay | Admitting: Internal Medicine

## 2016-07-17 ENCOUNTER — Encounter: Payer: Self-pay | Admitting: Internal Medicine

## 2016-07-17 ENCOUNTER — Other Ambulatory Visit: Payer: Self-pay | Admitting: Internal Medicine

## 2016-07-17 ENCOUNTER — Ambulatory Visit (INDEPENDENT_AMBULATORY_CARE_PROVIDER_SITE_OTHER): Payer: Medicare Other | Admitting: Internal Medicine

## 2016-07-17 VITALS — BP 118/78 | HR 62 | Ht 68.5 in | Wt 210.0 lb

## 2016-07-17 DIAGNOSIS — E1151 Type 2 diabetes mellitus with diabetic peripheral angiopathy without gangrene: Secondary | ICD-10-CM | POA: Diagnosis not present

## 2016-07-17 DIAGNOSIS — E1165 Type 2 diabetes mellitus with hyperglycemia: Secondary | ICD-10-CM

## 2016-07-17 DIAGNOSIS — IMO0002 Reserved for concepts with insufficient information to code with codable children: Secondary | ICD-10-CM

## 2016-07-17 LAB — POCT GLYCOSYLATED HEMOGLOBIN (HGB A1C): HEMOGLOBIN A1C: 7.6

## 2016-07-17 NOTE — Patient Instructions (Addendum)
Patient Instructions  Please continue: - Metformin 1000 mg 2x a day - Levemir 20 units at bedtime.  Increase: - Humalog 15 min before a meal:  6 units before a small meal 8 units before a regular meal 10 units before a large meal Please subtract 2 units if you plan to be active after a meal.  Please come back for a follow-up appointment in 3 months.

## 2016-07-17 NOTE — Addendum Note (Signed)
Addended by: Caprice Beaver T on: 07/17/2016 09:33 AM   Modules accepted: Orders

## 2016-07-17 NOTE — Progress Notes (Signed)
Subjective:     Patient ID: [name], male   DOB: 15-Dec-1932, 82 y.o.   MRN: [id number]  HPI [name] is a very pleasant 82 y.o. man returning for management of DM2, dx 07/2012, uncontrolled, insulin-dependent, with complications (CAD, peripheral neuropathy, PAD). Last visit 3 mo ago.  Still using a walker after his fall in 09/2015 >> still weakness in legs. He feels his weakness in leg.  Last HbA1C was: Lab Results  Component Value Date   HGBA1C 7.4 04/12/2016   HGBA1C 7.8 01/14/2016   HGBA1C 7.0 10/14/2015   He is on: - Metformin 1000 mg 2x a day - Levemir 20 >> 22 >> 20 units at bedtime - Humalog 15 min before a meal:  6 units before a regular meal 8 units before a larger meal Please subtract 2 units if you plan to be active after a meal.  Pt again brings a great log, he checks sugars ~2-4x a day >> ~ same with few higher spikes: cantaloupe /watermelon - am: 103-140, 160 >> 94-137, 150 >> 109-146, 171 >> 75 (if no bedtime snack)-145, 161 >> 109-156 - 2h after b'fast: 84, 90-140 >> 140-180 >> 104-156 >> 119-170 >>136-162 >> 61, 110-170, 180 >> 125-168, 210 - before lunch: 100-150 >> 142-170, 215 >> 97-148 >> 92-140 >> 85, 123-160 >> 91-150, 177 >> 98-155, 168 - 2h after lunch: 133-160 >> n/c >> 110, 130 >> 138-160 >> 140-183 >> 123-180 >> 105-170 >> 155-185 - dinner:139, 140, 176 >> 105-220 >> 108-130, 183 >> 104-174, 201>> 114-180 >> 78, 88-175 >> 128-165, 171 - 2h after dinner:92-161 >> 120-225 >> 107-180 >> 150-170 >> 140-160, 180 >> 135, 149-165, 185 >> 131-171 - bedtime: 138, 139 >> 115-220 >> 105-161 >> 116-184 >> 130-175 >> 125-170, 216 >> 108-174 Had lows in am (47-60s) >> not anymore >> 111, 205 >> 120-150  - no CKD. Last BUN/Cr: Lab Results  Component Value Date   BUN 23 04/20/2016   CREATININE 0.86 04/20/2016  Not on an ACEI. - last lipid panel: Lab Results  Component Value Date   CHOL 97 04/20/2016   HDL 35.40 (L) 04/20/2016   LDLCALC 48 04/20/2016    TRIG 70.0 04/20/2016   CHOLHDL 3 04/20/2016  Off Pravastatin. - He has peripheral neuropathy. He is taking a B complex vitamin, and has a neuropathy cream. Seeing [name] at the [facility].  - Last eye exam 11/2015: no Bobby ([name]). Cataract Sx in 08/2015.  - On ASA 81.  He had a heart catheterization on 09/03/2014 >> 2 small Blockages >> no intervention other than statins. 2D ECHO with normal EF.  In 08/2015 he was hospitalized for ?sepsis with strep pneumoniae after acquiring left lower lobe pneumonia.   I reviewed pt's medications, allergies, PMH, social hx, family hx, and changes were documented in the history of present illness. Otherwise, unchanged from my initial visit note. He started [employer].  Review of Systems Constitutional: no weight gain, no fatigue, no subjective hyperthermia/hypothermia Eye: no blurry vision, no xerophthalmia ENT: no sore throat, no dysphagia/odynophagia/hoarseness Cardiovascular: no CP/SOB/no palpitations/leg swelling Respiratory: no cough/SOB Gastrointestinal: no N/V/D/C Musculoskeletal: no muscle/joint aches Skin: no rash Neuro: no tremors; + weakness in legs   Objective:   Physical Exam BP 118/78 (BP Location: Left Arm, Patient Position: Sitting)   Pulse 62   Ht 5' 8.5" (1.74 m)   Wt 210 lb (95.3 kg)   SpO2 96%   BMI 31.47 kg/m  Body  mass index is 31.47 kg/m.  Wt Readings from Last 3 Encounters:  07/17/16 210 lb (95.3 kg)  04/20/16 209 lb 6.4 oz (95 kg)  04/12/16 212 lb (96.2 kg)  Constitutional: normal weight, in NAD Eyes: PERRLA, EOMI, no exophthalmos ENT: moist mucous membranes, no thyromegaly, no cervical lymphadenopathy Cardiovascular: RRR, + SEM 2/6, no RG, + leg edema R>L Respiratory: CTA B Gastrointestinal: abdomen soft, NT, ND, BS+ Skin: moist, warm, no rashes Neurological: no tremor with outstretched hands  Assessment:     1. DM2, insulin-dependent, uncontrolled, with complications; target 123XX123 for him:  7-7.5% - CAD - peripheral neuropathy - PAD Bobby. Stanford [name]  Plan:     1. Pt with long standing Diabetes mellitus type 2, with improved control after adding mealtime insulin. At last visit, we decreased Levemir to 20 units so that he does not have to eat a snack at bedtime. His sugars are a little higher >> will increase mealtime insulin. Advised to stay active. - I advised him to:  Patient Instructions  Please continue: - Metformin 1000 mg 2x a day - Levemir 20 units at bedtime.  Increase: - Humalog 15 min before a meal:  6 units before a small meal 8 units before a regular meal 10 units before a large meal Please subtract 2 units if you plan to be active after a meal.  Please come back for a follow-up appointment in 3 months.  - he is very conscientious about taking his medicines and keeping a good CBG log >> continue checking CBGs 3x a day.  - up to date with eye exams - Will check HbA1c today >> 7.6% (a little higher) - RTC in 3 mo with sugar log.  Bobby Kingdom, MD PhD [facility] Endocrinology

## 2016-07-18 ENCOUNTER — Other Ambulatory Visit: Payer: Self-pay | Admitting: Internal Medicine

## 2016-07-26 NOTE — Progress Notes (Signed)
HPI:  Several new issues:  R knee pain/weakness: -since surgery remotely -never feels regain his full balance and has occasional falls due to perceived weakness here -reports saw his surgeon about if a few years ago and was told could do meds or injs, but not much else could be done -denies: worsening, swelling, numbness  R neck pain: -chronic -doing exercises and help some, but feels worse over the years -has lipoma here and wonders if contributing, reports had biopsy of this remotely and told benign and surgery not a good idea -pain and decreased ROM R neck -no radiation to arm, weakness or numbness arm  Diabetes w peripheral circulatory disorder  - Last A1c 123456 7.6 -complications: peripheral neuropathy -managed by endocrinology  -meds: metformin, Levimir,  Mealtime insulin - reports mealtime insulin just increased -eye exam: utd  -foot exam: utd -denies:hypoglycemia, vision changes   Essential hypertension  - stable  -meds: norvasc 5, doxasosin 4, lasix 40,  -denies: CP, SOB, DOE   CAD, Chronic diastolic congestive heart failure  - 2-D echo on 08/26/14 showed EF 60-65% with grade 1 diastolic dysfunction.  -cath 2015 30% LM, 40-50%LAD  -meds: asa, statin, lasix  - denies swelling, SOB, DOE -cardiologist: Dr. [name]   OAB (overactive bladder)  -meds: Cardura, myrbetriq, vesicare -stable  -Sees [facility] urologist, Dr. Patsy [place]  GERD: -meds: prilosec  Hyperlipemia  - stable  - meds: Pravastatin   Anemia:  -during hospital stay 2016 and mild in the past on review cardiology records - improving last check -he denies any melena, weight loss, fevers, chronic fatigue improved significantly after holding naproxen  R shoulder pain: -new issue, started about 2 weeks ago -achy pain in R neck, traps, radiating to upper R arm -feels pinched a nerve; very active with yard work,but can't think of injury -now much better with radiating symptoms resolved and  neck/shoulde rpain resolving/mild -denies: weakness, numbness, paresthesia, fevers, malaise, ha  ROS: See pertinent positives and negatives per HPI.  Past Medical History:  Diagnosis Date  . Anemia   . Atherosclerotic PVD with intermittent claudication ([place])   . CAD (coronary artery disease)   . Diabetes mellitus    type 2 with peripheral neuropathy  . Diastolic CHF, chronic ([place])   . Diverticulosis   . DVT (deep venous thrombosis) ([place])   . Dyslipidemia   . Edema 08/19/2014  . GERD (gastroesophageal reflux disease)    hx hiatal hernia  . HTN (hypertension)   . IBS (irritable bowel syndrome)    Hx: of  . Osteoarthritis   . Overactive bladder    Hx: of stress incontinence  . Pneumonia 2002   history of  . Shingles   . Varicose veins of lower extremities with other complications 0000000    Past Surgical History:  Procedure Laterality Date  . BACK SURGERY    . CATARACT EXTRACTION Bilateral 08/10/2015,08/15/2015  . COLONOSCOPY     Hx: of  . CTS bilateral    . KNEE ARTHROSCOPY  1988, 1992   bilateral  . LAMINECTOMY  08/18/2013   DECOMPRESSIVE LAMINECTOMIES  . LEFT AND RIGHT HEART CATHETERIZATION WITH CORONARY ANGIOGRAM N/A 09/03/2014   Procedure: LEFT AND RIGHT HEART CATHETERIZATION WITH CORONARY ANGIOGRAM;  Surgeon: [name], MD;  Location: [facility] CATH LAB;  Service: Cardiovascular;  Laterality: N/A;  . LUMBAR LAMINECTOMY/DECOMPRESSION MICRODISCECTOMY N/A 08/18/2013   Procedure: Lumbar two-three, lumbar three-four, lumbar four-five decompressive laminectomies;  Surgeon: [name], MD;  Location: [facility];  Service: Neurosurgery;  Laterality: N/A;  . lumbar left arm  1961  . TONSILLECTOMY    . TOTAL KNEE ARTHROPLASTY  2005, 2007  . wrist tendon surgery      Family History  Problem Relation Age of Onset  . Other Mother 57    CABG  . Heart disease Mother   . Hypertension Mother   . Hyperlipidemia Son     Social History   Social History  . Marital  status: Married    Spouse name: N/A  . Number of children: 2  . Years of education: N/A   Social History Main Topics  . Smoking status: Never Smoker  . Smokeless tobacco: Never Used  . Alcohol use No  . Drug use: No  . Sexual activity: Not Asked   Other Topics Concern  . 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  acetaminophen (TYLENOL) 500 MG tablet, Take 500 mg by mouth 2 (two) times daily., Disp: , Rfl:  .  amLODipine (NORVASC) 5 MG tablet, TAKE ONE TABLET BY MOUTH DAILY, Disp: 90 tablet, Rfl: 3 .  aspirin 81 MG tablet, Take 81 mg by mouth daily. , Disp: , Rfl:  .  B-D UF III MINI PEN NEEDLES 31G X 5 MM MISC, USE 4X A DAY AS DIRECTED, Disp: 200 each, Rfl: 4 .  cholecalciferol (VITAMIN D) 1000 units tablet, Take 2,000 Units by mouth daily., Disp: , Rfl:  .  doxazosin (CARDURA) 4 MG tablet, TAKE ONE TABLET BY MOUTH DAILY, Disp: 90 tablet, Rfl: 1 .  furosemide (LASIX) 40 MG tablet, Take 1 tablet (40 mg total) by mouth daily., Disp: 30 tablet, Rfl: 11 .  glucose blood (ONETOUCH VERIO) test strip, USE TO TEST BLOOD SUGAR 3 TIMES DAILY AS INSTRUCTED. DX: E11.51, Disp: 100 each, Rfl: 5 .  HUMALOG KWIKPEN 100 UNIT/ML KiwkPen, INJECT 6 TO 10 UNITS BEFOR EACH MEAL 3 TIMES A DAY., Disp: 15 pen, Rfl: 3 .  LEVEMIR FLEXTOUCH 100 UNIT/ML Pen, INJECT 20 UNITS INTO THE SKIN DAILY AT 10 PM., Disp: 15 pen, Rfl: 1 .  metFORMIN (GLUCOPHAGE) 1000 MG tablet, TAKE 1 TABLET (1,000 MG TOTAL) BY MOUTH 2 (TWO) TIMES DAILY WITH A MEAL., Disp: 60 tablet, Rfl: 1 .  Multiple Vitamins-Minerals (CENTRUM SILVER PO), Take 1 tablet by mouth daily. , Disp: , Rfl:  .  MYRBETRIQ 50 MG TB24  tablet, Take 50 mg by mouth at bedtime. , Disp: , Rfl:  .  omeprazole (PRILOSEC) 20 MG capsule, TAKE 1 CAPSULE (20 MG TOTAL) BY MOUTH DAILY., Disp: 90 capsule, Rfl: 1 .  ONETOUCH DELICA LANCETS [id number] MISC, USE TO TEST BLOOD SUGAR 3 TIMES A DAY AS INSTRUCTED., Disp: 300 each, Rfl: 2 .  ONETOUCH VERIO test strip, USE TO TEST BLOOD SUGAR 3 TIMES DAILY AS INSTRUCTED., Disp: 100 each, Rfl: 4 .  pravastatin (PRAVACHOL) 40 MG tablet, TAKE 1 TABLET (40 MG TOTAL) BY MOUTH EVERY EVENING., Disp: 30 tablet, Rfl: 11 .  Propylene Glycol (SYSTANE BALANCE) 0.6 % SOLN, Apply to eye. 2 drops each eye once daily,  Disp: , Rfl:  .  VESICARE 5 MG tablet, , Disp: , Rfl:   EXAM:  Vitals:   07/27/16 0753  BP: 130/64  Pulse: 66  Temp: 98 F (36.7 C)    Body mass index is 30.7 kg/m.  GENERAL: vitals reviewed and listed above, alert, oriented, appears well hydrated and in no acute distress  HEENT: atraumatic, conjunttiva clear, no obvious abnormalities on inspection of external nose and ears  NECK: no obvious masses on inspection  LUNGS: clear to auscultation bilaterally, no wheezes, rales or rhonchi, good air movement  CV: HRRR, no peripheral edema  MS: moves all extremities without noticeable abnormality; uses walker, Status post knee surgery bilaterally with chronically enlarged knee joints, no significant weakness on exam, good sensitivity to light touch in the lower extremities; he has a soft subcutaneous cutaneous mass several centimeters in diameter in the right upper back near the spine, tenderness to palpation in the paracervical muscles and trapezius mid fibers with some decreased range of motion in rotation of the head and neck to the right in particular, no significant bony tenderness to palpation of the cervical and thoracic spine, seems to have good strength in the upper extremities.  PSYCH: pleasant and cooperative, no obvious depression or anxiety  ASSESSMENT AND PLAN:  Discussed the following  assessment and plan:  Physical deconditioning - Plan: Ambulatory referral to Physical Therapy Osteoarthritis of multiple joints, unspecified osteoarthritis type - Plan: Ambulatory referral to Physical Therapy -We'll have him do some physical therapy for this and follow-up with his orthopedic doctor if this does not help  Neck pain on right side - Plan: DG Cervical Spine Complete Mass on back - Plan: DG Cervical Spine Complete -Likely some osteoarthritis in the neck, will get plain films to further evaluate -I don't know if the mass is playing a role, it sounds like she has had a biopsy and this is a likely lipoma given the location and exam findings; he declined seeing a surgeon for removal and risk of surgery may outweigh any benefits, but we'll likely have him see Dr. Nelva Bush to get his thoughts on this and see if he can do anything to help  Diabetic peripheral neuropathy associated with type 2 diabetes mellitus ([place]) -Advised would like hemoglobin A1c around 7 or slightly below; he said this is the same goal with his and endocrinologist there working on it  Hyperlipemia Essential hypertension Chronic diastolic congestive heart failure ([place]) Coronary artery disease due to lipid rich plaque - continue current treatment   Gastroesophageal reflux disease, esophagitis presence not specified -Continue current treatment   OAB (overactive bladder) -Continue current treatment with urologist   -Patient advised to return or notify a doctor immediately if symptoms worsen or persist or new concerns arise.  Patient Instructions  BEFORE YOU LEAVE: -follow up: 3-4 months -xray sheet  I placed a referral to physical therapy about the balance and knee issues.  Please get the xray of the neck, then we will try to get you in with Dr. Nelva Bush at [facility] about the neck issues.  We recommend the following healthy lifestyle: 1) Small portions - eat off of salad plate instead of dinner  plate 2) Eat a healthy clean diet with avoidance of (less then 1 serving per week) processed foods, sweetened drinks, white starches, red meat, fast foods and sweets and consisting of: * 5-9 servings per day of fresh or frozen fruits and vegetables (not corn or potatoes, not dried or canned) *nuts and  seeds, beans *olives and olive oil *small portions of lean meats such as fish and white chicken  *small portions of whole grains 3)Get at least 150 minutes of sweaty aerobic exercise per week 4)reduce stress - counseling, meditation, relaxation to balance other aspects of your life     Colin Benton R., DO

## 2016-07-27 ENCOUNTER — Encounter: Payer: Self-pay | Admitting: Family Medicine

## 2016-07-27 ENCOUNTER — Ambulatory Visit (INDEPENDENT_AMBULATORY_CARE_PROVIDER_SITE_OTHER)
Admission: RE | Admit: 2016-07-27 | Discharge: 2016-07-27 | Disposition: A | Discharge status: Home / Self Care | Payer: Medicare Other | Source: Ambulatory Visit | Attending: Family Medicine | Admitting: Family Medicine

## 2016-07-27 ENCOUNTER — Ambulatory Visit (INDEPENDENT_AMBULATORY_CARE_PROVIDER_SITE_OTHER): Payer: Medicare Other | Admitting: Family Medicine

## 2016-07-27 VITALS — BP 130/64 | HR 66 | Temp 98.0°F | Ht 68.5 in | Wt 204.9 lb

## 2016-07-27 DIAGNOSIS — I10 Essential (primary) hypertension: Secondary | ICD-10-CM

## 2016-07-27 DIAGNOSIS — K219 Gastro-esophageal reflux disease without esophagitis: Secondary | ICD-10-CM

## 2016-07-27 DIAGNOSIS — M542 Cervicalgia: Secondary | ICD-10-CM

## 2016-07-27 DIAGNOSIS — R229 Localized swelling, mass and lump, unspecified: Secondary | ICD-10-CM | POA: Diagnosis not present

## 2016-07-27 DIAGNOSIS — I5032 Chronic diastolic (congestive) heart failure: Secondary | ICD-10-CM

## 2016-07-27 DIAGNOSIS — M159 Polyosteoarthritis, unspecified: Secondary | ICD-10-CM

## 2016-07-27 DIAGNOSIS — R5381 Other malaise: Secondary | ICD-10-CM

## 2016-07-27 DIAGNOSIS — N3281 Overactive bladder: Secondary | ICD-10-CM

## 2016-07-27 DIAGNOSIS — E1142 Type 2 diabetes mellitus with diabetic polyneuropathy: Secondary | ICD-10-CM

## 2016-07-27 DIAGNOSIS — R222 Localized swelling, mass and lump, trunk: Secondary | ICD-10-CM

## 2016-07-27 DIAGNOSIS — I2583 Coronary atherosclerosis due to lipid rich plaque: Secondary | ICD-10-CM

## 2016-07-27 DIAGNOSIS — E785 Hyperlipidemia, unspecified: Secondary | ICD-10-CM

## 2016-07-27 DIAGNOSIS — I2510 Atherosclerotic heart disease of native coronary artery without angina pectoris: Secondary | ICD-10-CM

## 2016-07-27 NOTE — Patient Instructions (Signed)
BEFORE YOU LEAVE: -follow up: 3-4 months -xray sheet  I placed a referral to physical therapy about the balance and knee issues.  Please get the xray of the neck, then we will try to get you in with Dr. Nelva Bush at [facility] about the neck issues.  We recommend the following healthy lifestyle: 1) Small portions - eat off of salad plate instead of dinner plate 2) Eat a healthy clean diet with avoidance of (less then 1 serving per week) processed foods, sweetened drinks, white starches, red meat, fast foods and sweets and consisting of: * 5-9 servings per day of fresh or frozen fruits and vegetables (not corn or potatoes, not dried or canned) *nuts and seeds, beans *olives and olive oil *small portions of lean meats such as fish and white chicken  *small portions of whole grains 3)Get at least 150 minutes of sweaty aerobic exercise per week 4)reduce stress - counseling, meditation, relaxation to balance other aspects of your life

## 2016-07-27 NOTE — Progress Notes (Signed)
Pre visit review using our clinic review tool, if applicable. No additional management support is needed unless otherwise documented below in the visit note. 

## 2016-07-30 ENCOUNTER — Other Ambulatory Visit: Payer: Self-pay | Admitting: Internal Medicine

## 2016-07-31 ENCOUNTER — Other Ambulatory Visit: Payer: Self-pay | Admitting: *Deleted

## 2016-07-31 DIAGNOSIS — M5030 Other cervical disc degeneration, unspecified cervical region: Secondary | ICD-10-CM

## 2016-08-01 ENCOUNTER — Encounter: Payer: Self-pay | Admitting: Physical Therapy

## 2016-08-01 ENCOUNTER — Ambulatory Visit: Payer: Medicare Other | Attending: Family Medicine | Admitting: Physical Therapy

## 2016-08-01 DIAGNOSIS — M6281 Muscle weakness (generalized): Secondary | ICD-10-CM

## 2016-08-01 DIAGNOSIS — M25561 Pain in right knee: Secondary | ICD-10-CM | POA: Diagnosis present

## 2016-08-01 DIAGNOSIS — R262 Difficulty in walking, not elsewhere classified: Secondary | ICD-10-CM | POA: Diagnosis present

## 2016-08-01 DIAGNOSIS — R296 Repeated falls: Secondary | ICD-10-CM | POA: Diagnosis present

## 2016-08-01 NOTE — Therapy (Signed)
[facility] [address] [place], [place], [address] Phone: [phone]   Fax:  [phone]  Physical Therapy Treatment  Patient Details  Name: [name] MRN: [id number] Date of Birth: 01/24/1933 Referring Provider: Colin Benton DO  Encounter Date: 08/01/2016      PT End of Session - 08/01/16 1505    Visit Number 1   Number of Visits 10   Date for PT Re-Evaluation 09/26/16   Authorization Type Medicare G codes:  KX at visit 15   PT Start Time 1015   PT Stop Time 1100   PT Time Calculation (min) 45 min   Activity Tolerance Patient limited by fatigue      Past Medical History:  Diagnosis Date  . Anemia   . Atherosclerotic PVD with intermittent claudication ([place])   . CAD (coronary artery disease)   . Diabetes mellitus    type 2 with peripheral neuropathy  . Diastolic CHF, chronic ([place])   . Diverticulosis   . DVT (deep venous thrombosis) ([place])   . Dyslipidemia   . Edema 08/19/2014  . GERD (gastroesophageal reflux disease)    hx hiatal hernia  . HTN (hypertension)   . IBS (irritable bowel syndrome)    Hx: of  . Osteoarthritis   . Overactive bladder    Hx: of stress incontinence  . Pneumonia 2002   history of  . Shingles   . Varicose veins of lower extremities with other complications 0000000    Past Surgical History:  Procedure Laterality Date  . BACK SURGERY    . CATARACT EXTRACTION Bilateral 08/10/2015,08/15/2015  . COLONOSCOPY     Hx: of  . CTS bilateral    . KNEE ARTHROSCOPY  1988, 1992   bilateral  . LAMINECTOMY  08/18/2013   DECOMPRESSIVE LAMINECTOMIES  . LEFT AND RIGHT HEART CATHETERIZATION WITH CORONARY ANGIOGRAM N/A 09/03/2014   Procedure: LEFT AND RIGHT HEART CATHETERIZATION WITH CORONARY ANGIOGRAM;  Surgeon: [name], MD;  Location: [facility] CATH LAB;  Service: Cardiovascular;  Laterality: N/A;  . LUMBAR LAMINECTOMY/DECOMPRESSION MICRODISCECTOMY N/A 08/18/2013   Procedure: Lumbar  two-three, lumbar three-four, lumbar four-five decompressive laminectomies;  Surgeon: [name], MD;  Location: [facility];  Service: Neurosurgery;  Laterality: N/A;  . lumbar left arm  1961  . TONSILLECTOMY    . TOTAL KNEE ARTHROPLASTY  2005, 2007  . wrist tendon surgery      There were no vitals filed for this visit.      Subjective Assessment - 08/01/16 1021    Subjective Patient complains of difficulty walking, hard to lift right LE, causing him to trip fall.  Multi joint OA;  Uses RW full time x 1year.   I need the cane to stand to water the flowers.   Going to see Dr. Nelva Bush regarding neck pain;  inability to be active;  had to have wheelchair to go to [employer] with grandchildren.  Difficulty turning head for driving.     Patient is accompained by: Family member   Pertinent History Right TKR, DM, Vertigo, CAD, CHF;   How long can you sit comfortably? as long as I want to but when I get up achy right hip, thigh;  lumbar discectomy/discectomy   How long can you walk comfortably? 10-15 yards then get weak feeling all over, some shortness of breath   Diagnostic tests X-ray of neck    Patient Stated Goals hopefully get my balance straightened out;  get stronger so  I can walk better   Currently in Pain? Yes   Pain Score 8    Pain Location Neck   Pain Orientation Right;Left   Pain Type Chronic pain   Aggravating Factors  turning head to the right;  walking;  standing in one spot > 1 minute (holds rails in shower);     Pain Relieving Factors sitting; heating pad on neck             [facility] PT Assessment - 08/01/16 0001      Assessment   Medical Diagnosis physical deconditioning;  OA multiple joints    Referring Provider Colin Benton DO   [facility] Date/Surgical Date --  > 6 months   Hand Dominance Right   Next MD Visit 3 months   Prior Therapy TKR 2005, 2007     Precautions   Precautions Fall     Restrictions   Weight Bearing Restrictions No     Balance Screen    Has the patient fallen in the past 6 months Yes   How many times? 3 or 4  3 of falls in yard caugth toe   Has the patient had a decrease in activity level because of a fear of falling?  Yes   Is the patient reluctant to leave their home because of a fear of falling?  No     Home Environment   Living Environment Private residence   Living Arrangements Spouse/significant other   Type of [place] Access Level entry   Home Layout One level   [place] - 4 wheels;Cane - quad     Prior Function   Level of Independence Independent   Vocation Retired   Leisure [employer] surgeon yard;  I move around in the house;  go out with breakfast buddies     Observation/Other Assessments   Focus on Therapeutic Outcomes (FOTO)  not done secondary to multi region pain     Posture/Postural Control   Posture/Postural Control Postural limitations   Postural Limitations Rounded Shoulders;Forward head;Decreased thoracic kyphosis     ROM / Strength   AROM / PROM / Strength AROM;Strength     AROM   Overall AROM Comments UE WFLs;  pain and very limited cervical AROM in all planes   AROM Assessment Site Hip;Knee;Ankle     Strength   Overall Strength Comments Unable to single leg stand on right secondary to knee give-way;  able to rise from standard without UEs after several tries   Strength Assessment Site Hip;Knee;Ankle   Right/Left Hip Right;Left   Right Hip Flexion 4-/5   Right Hip Extension 3+/5   Right Hip ABduction 3+/5   Left Hip Flexion 4/5   Left Hip Extension 4-/5   Left Hip ABduction 4-/5   Right/Left Knee Right;Left   Right Knee Flexion 3+/5   Right Knee Extension 3+/5   Left Knee Flexion 4-/5   Left Knee Extension 4-/5   Right/Left Ankle Right;Left     Flexibility   Soft Tissue Assessment /Muscle Length yes   Hamstrings bilateral decreased muscle length   Quadriceps bilateral decreased hip flexor lengths     Palpation   Palpation comment audible and  palpable right knee crepitus     Ambulation/Gait   Ambulation Distance (Feet) 40 Feet   Assistive device 4-wheeled walker   Gait Comments noted right LE excessive toe out/bumps heel on opposite foot several times     Berg Balance Test   Sit to  Stand Able to stand  independently using hands   Standing Unsupported Able to stand 2 minutes with supervision   Sitting with Back Unsupported but Feet Supported on Floor or Stool Able to sit 2 minutes under supervision   Stand to Sit Controls descent by using hands   Transfers Able to transfer safely, definite need of hands   Standing Unsupported with Eyes Closed Able to stand 10 seconds with supervision   Standing Ubsupported with Feet Together Needs help to attain position but able to stand for 30 seconds with feet together   From Standing, Reach Forward with Outstretched Arm Can reach forward >5 cm safely (2")   From Standing Position, Pick up Object from [place] to pick up shoe, needs supervision   From Standing Position, Turn to Look Behind Over each Shoulder Looks behind one side only/other side shows less weight shift   Turn 360 Degrees Needs close supervision or verbal cueing   Standing Unsupported, Alternately Place Feet on Step/Stool Needs assistance to keep from falling or unable to try   Standing Unsupported, One Foot in [place] help to step but can hold 15 seconds   Standing on One Leg Unable to try or needs assist to prevent fall   Total Score 29     Timed Up and Go Test   Manual TUG (seconds) 19   TUG Comments with RW                               PT Short Term Goals - 08/01/16 1519      PT SHORT TERM GOAL #1   Title The patient will demonstrate compliance with basic HEP to improve LE muscle lengths and initial LE strengthening   08/29/16   Time 4   Period Weeks   Status New     PT SHORT TERM GOAL #2   Title The patient will have improved Timed up and Go test to 17 sec indicating improved gait  speed for short distance ambulation   Time 4   Period Weeks   Status New     PT SHORT TERM GOAL #3   Title BERG balance test score improved to 34/56 indicating improved static and dynamic balance for decreased fall risk   Time 4   Period Weeks   Status New     PT SHORT TERM GOAL #4   Title HS length improved to 55 degrees needed for improved standing and walking tolerance   Time 4   Period Weeks   Status New           PT Long Term Goals - 08/01/16 1524      PT LONG TERM GOAL #1   Title The patient will be independent in a progressive HEP and appropriate [employer]neakers program for further progress and improvements in mobility   09/26/16   Time 8   Period Weeks   Status New     PT LONG TERM GOAL #2   Title The patient will report a 50% improvement in pain and function with ADLS   Time 8   Period Weeks   Status New     PT LONG TERM GOAL #3   Title The patient will have bilateral LE strength to grossly 4/5 to be able to stand 5 minutes for shaving, showering and personal care   Time 8   Period Weeks   Status New     PT  LONG TERM GOAL #4   Title Patient will be able to ambulate 240 feet with RW needed for short distance community ambulation    Time 8   Period Weeks   Status New     PT LONG TERM GOAL #5   Title BERG balance test score improved to 36/56 indicating decreased fall risk   Time 8   Period Weeks   Status New     Additional Long Term Goals   Additional Long Term Goals Yes     PT LONG TERM GOAL #6   Title Timed up and Go test improved to 14 sec indicating improved gait speed for community ambulation   Time 8   Period Weeks   Status New               Plan - August 17, 2016 1507    Clinical Impression Statement The patient presents with medical diagnoses of physical deconditioning and multi joint osteoarthritis.  He has right knee pain and neck pain but he reports his primary complaint is weakness limiting his ability to stand and walk and frequent  falls (3x in the last 6 months.).  He uses a 4 wheeled walker almost full time but uses a cane in the house sometimes.   His gait distance even with the walker is very limited approx 40 feet.   When he goes to the baseball game or to [employer] with his grandchildren he uses a wheelchair.  Difficulty rising from sit to stand without UE use.  Right LE strength grossly 3+/5, left 4-/5.  Decreased core strength 3+/5.  Decreased HS and hip flexor muscle lengths.  BERG score 29/56 indicating he is at 100% risk of falls and needs walker full time for indoor and outdoor terrain.  Timed up and Go 19 sec (norm 12.7).  The patient is of moderate complexity evaluation due to an evolving condition (progressive worsening over time) and numerous co-morbidities including diabetes, neuropathy, CHF, CAD, previous spinal surgery and multi joint arthritis.     Rehab Potential Good   Clinical Impairments Affecting Rehab Potential DM with LE neuropathy, CAD, CHF, vertigo, Bilateral TKR 10+ years ago   PT Frequency 2x / week   PT Duration 8 weeks   PT Treatment/Interventions ADLs/Self Care Home Management;Electrical Stimulation;Cryotherapy;Moist Heat;Ultrasound;Balance training;Therapeutic exercise;Neuromuscular re-education;Patient/family education;Manual techniques;Taping   PT Next Visit Plan Check 6 min walk test capacity (expect numerous rest breaks will be needed; also check Oxygen saturation);  start Nu-Step;  initiate low level supine or seated LE stretching and strengthening for basic HEP   Recommended Other Services Patient/wife interested in joining [employer] program at [employer] upon discharge   Consulted and Agree with Plan of Care Family member/caregiver      Patient will benefit from skilled therapeutic intervention in order to improve the following deficits and impairments:  Abnormal gait, Decreased activity tolerance, Decreased balance, Decreased mobility, Decreased endurance, Decreased  range of motion, Difficulty walking, Impaired flexibility, Pain, Postural dysfunction  Visit Diagnosis: Muscle weakness (generalized) - Plan: PT plan of care cert/re-cert  Repeated falls - Plan: PT plan of care cert/re-cert  Difficulty in walking, not elsewhere classified - Plan: PT plan of care cert/re-cert  Pain in right knee - Plan: PT plan of care cert/re-cert       G-Codes - Aug 17, 2016 1530    Functional Assessment Tool Used BERG; Timed up and Go; clinical judgement   Functional Limitation Mobility: Walking and moving around   Mobility: Walking and Moving  Around Current Status [id number]) At least 60 percent but less than 80 percent impaired, limited or restricted   Mobility: Walking and Moving Around Goal Status [phone]) At least 40 percent but less than 60 percent impaired, limited or restricted      Problem List Patient Active Problem List   Diagnosis Date Noted  . OAB (overactive bladder) 03/26/2015  . Chronic diastolic congestive heart failure ([place]) 11/23/2014  . CAD (coronary artery disease) 10/14/2014  . Diabetic peripheral neuropathy associated with type 2 diabetes mellitus ([place]) 05/30/2010  . Essential hypertension 08/20/2007  . Uncontrolled type 2 diabetes mellitus with peripheral circulatory disorder ([place]) 06/13/2007  . Hyperlipemia 06/13/2007  . GERD 06/13/2007    Alvera Singh 08/01/2016, 3:33 PM  [facility] [address], [place], [place], [address] Phone: [phone]   Fax:  [phone]  Name: [name] MRN: [id number] Date of Birth: [birth year]

## 2016-08-01 NOTE — Therapy (Signed)
[facility] [address] [place], [place], [address] Phone: [phone]   Fax:  [phone]  Physical Therapy Evaluation  Patient Details  Name: [name] MRN: [id number] Date of Birth: 25-Sep-1933 Referring Provider: Colin Benton DO  Encounter Date: 08/01/2016      PT End of Session - 08/01/16 1505    Visit Number 1   Number of Visits 10   Date for PT Re-Evaluation 09/26/16   Authorization Type Medicare G codes:  KX at visit 15   PT Start Time 1015   PT Stop Time 1100   PT Time Calculation (min) 45 min   Activity Tolerance Patient limited by fatigue      Past Medical History:  Diagnosis Date  . Anemia   . Atherosclerotic PVD with intermittent claudication ([place])   . CAD (coronary artery disease)   . Diabetes mellitus    type 2 with peripheral neuropathy  . Diastolic CHF, chronic ([place])   . Diverticulosis   . DVT (deep venous thrombosis) ([place])   . Dyslipidemia   . Edema 08/19/2014  . GERD (gastroesophageal reflux disease)    hx hiatal hernia  . HTN (hypertension)   . IBS (irritable bowel syndrome)    Hx: of  . Osteoarthritis   . Overactive bladder    Hx: of stress incontinence  . Pneumonia 2002   history of  . Shingles   . Varicose veins of lower extremities with other complications 0000000    Past Surgical History:  Procedure Laterality Date  . BACK SURGERY    . CATARACT EXTRACTION Bilateral 08/10/2015,08/15/2015  . COLONOSCOPY     Hx: of  . CTS bilateral    . KNEE ARTHROSCOPY  1988, 1992   bilateral  . LAMINECTOMY  08/18/2013   DECOMPRESSIVE LAMINECTOMIES  . LEFT AND RIGHT HEART CATHETERIZATION WITH CORONARY ANGIOGRAM N/A 09/03/2014   Procedure: LEFT AND RIGHT HEART CATHETERIZATION WITH CORONARY ANGIOGRAM;  Surgeon: [name], MD;  Location: [facility] CATH LAB;  Service: Cardiovascular;  Laterality: N/A;  . LUMBAR LAMINECTOMY/DECOMPRESSION MICRODISCECTOMY N/A 08/18/2013   Procedure: Lumbar  two-three, lumbar three-four, lumbar four-five decompressive laminectomies;  Surgeon: [name], MD;  Location: [place] [facility]UR[facility];  Service: Neurosurgery;  Laterality: N/A;  . lumbar left arm  1961  . TONSILLECTOMY    . TOTAL KNEE ARTHROPLASTY  2005, 2007  . wrist tendon surgery      There were no vitals filed for this visit.       Subjective Assessment - 08/01/16 1021    Subjective Patient complains of difficulty walking, hard to lift right LE, causing him to trip fall.  Multi joint OA;  Uses RW full time x 1year.   I need the cane to stand to water the flowers.   Going to see Dr. Nelva Bush regarding neck pain;  inability to be active;  had to have wheelchair to go to [employer] with grandchildren.  Difficulty turning head for driving.     Patient is accompained by: Family member   Pertinent History Right TKR, DM, Vertigo, CAD, CHF;   How long can you sit comfortably? as long as I want to but when I get up achy right hip, thigh;  lumbar discectomy/discectomy   How long can you walk comfortably? 10-15 yards then get weak feeling all over, some shortness of breath   Diagnostic tests X-ray of neck    Patient Stated Goals hopefully get my balance straightened out;  get stronger  so I can walk better   Currently in Pain? Yes   Pain Score 8    Pain Location Neck   Pain Orientation Right;Left   Pain Type Chronic pain   Aggravating Factors  turning head to the right;  walking;  standing in one spot > 1 minute (holds rails in shower);     Pain Relieving Factors sitting; heating pad on neck             [facility] PT Assessment - 08/01/16 0001      Assessment   Medical Diagnosis physical deconditioning;  OA multiple joints    Referring Provider Colin Benton DO   Onset Date/Surgical Date --  > 6 months   Hand Dominance Right   Next MD Visit 3 months   Prior Therapy TKR 2005, 2007     Precautions   Precautions Fall     Restrictions   Weight Bearing Restrictions No     Balance Screen    Has the patient fallen in the past 6 months Yes   How many times? 3 or 4  3 of falls in yard caugth toe   Has the patient had a decrease in activity level because of a fear of falling?  Yes   Is the patient reluctant to leave their home because of a fear of falling?  No     Home Environment   Living Environment Private residence   Living Arrangements Spouse/significant other   Type of Annex Access Level entry   Home Layout One level   [place] - 4 wheels;Cane - quad     Prior Function   Level of Independence Independent   Vocation Retired   Leisure Tree surgeon yard;  I move around in the house;  go out with breakfast buddies     Observation/Other Assessments   Focus on Therapeutic Outcomes (FOTO)  not done secondary to multi region pain     Posture/Postural Control   Posture/Postural Control Postural limitations   Postural Limitations Rounded Shoulders;Forward head;Decreased thoracic kyphosis     ROM / Strength   AROM / PROM / Strength AROM;Strength     AROM   Overall AROM Comments UE WFLs;  pain and very limited cervical AROM in all planes   AROM Assessment Site Hip;Knee;Ankle     Strength   Overall Strength Comments Unable to single leg stand on right secondary to knee give-way;  able to rise from standard without UEs after several tries   Strength Assessment Site Hip;Knee;Ankle   Right/Left Hip Right;Left   Right Hip Flexion 4-/5   Right Hip Extension 3+/5   Right Hip ABduction 3+/5   Left Hip Flexion 4/5   Left Hip Extension 4-/5   Left Hip ABduction 4-/5   Right/Left Knee Right;Left   Right Knee Flexion 3+/5   Right Knee Extension 3+/5   Left Knee Flexion 4-/5   Left Knee Extension 4-/5   Right/Left Ankle Right;Left     Flexibility   Soft Tissue Assessment /Muscle Length yes   Hamstrings bilateral decreased muscle length   Quadriceps bilateral decreased hip flexor lengths     Palpation   Palpation comment audible  and palpable right knee crepitus     Ambulation/Gait   Ambulation Distance (Feet) 40 Feet   Assistive device 4-wheeled walker   Gait Comments noted right LE excessive toe out/bumps heel on opposite foot several times     [employer]  to Stand Able to stand  independently using hands   Standing Unsupported Able to stand 2 minutes with supervision   Sitting with Back Unsupported but Feet Supported on Floor or Stool Able to sit 2 minutes under supervision   Stand to Sit Controls descent by using hands   Transfers Able to transfer safely, definite need of hands   Standing Unsupported with Eyes Closed Able to stand 10 seconds with supervision   Standing Ubsupported with Feet Together Needs help to attain position but able to stand for 30 seconds with feet together   From Standing, Reach Forward with Outstretched Arm Can reach forward >5 cm safely (2")   From Standing Position, Pick up Object from [place] to pick up shoe, needs supervision   From Standing Position, Turn to Look Behind Over each Shoulder Looks behind one side only/other side shows less weight shift   Turn 360 Degrees Needs close supervision or verbal cueing   Standing Unsupported, Alternately Place Feet on Step/Stool Needs assistance to keep from falling or unable to try   Standing Unsupported, One Foot in [place] help to step but can hold 15 seconds   Standing on One Leg Unable to try or needs assist to prevent fall   Total Score 29     Timed Up and Go Test   Manual TUG (seconds) 19   TUG Comments with RW                             PT Short Term Goals - 08/01/16 1519      PT SHORT TERM GOAL #1   Title The patient will demonstrate compliance with basic HEP to improve LE muscle lengths and initial LE strengthening   08/29/16   Time 4   Period Weeks   Status New     PT SHORT TERM GOAL #2   Title The patient will have improved Timed up and Go test to 17 sec indicating improved gait  speed for short distance ambulation   Time 4   Period Weeks   Status New     PT SHORT TERM GOAL #3   Title BERG balance test score improved to 34/56 indicating improved static and dynamic balance for decreased fall risk   Time 4   Period Weeks   Status New     PT SHORT TERM GOAL #4   Title HS length improved to 55 degrees needed for improved standing and walking tolerance   Time 4   Period Weeks   Status New           PT Long Term Goals - 08/01/16 1524      PT LONG TERM GOAL #1   Title The patient will be independent in a progressive HEP and appropriate Silver Sneakers program for further progress and improvements in mobility   09/26/16   Time 8   Period Weeks   Status New     PT LONG TERM GOAL #2   Title The patient will report a 50% improvement in pain and function with ADLS   Time 8   Period Weeks   Status New     PT LONG TERM GOAL #3   Title The patient will have bilateral LE strength to grossly 4/5 to be able to stand 5 minutes for shaving, showering and personal care   Time 8   Period Weeks   Status New     PT LONG  TERM GOAL #4   Title Patient will be able to ambulate 240 feet with RW needed for short distance community ambulation    Time 8   Period Weeks   Status New     PT LONG TERM GOAL #5   Title BERG balance test score improved to 36/56 indicating decreased fall risk   Time 8   Period Weeks   Status New     Additional Long Term Goals   Additional Long Term Goals Yes     PT LONG TERM GOAL #6   Title Timed up and Go test improved to 14 sec indicating improved gait speed for community ambulation   Time 8   Period Weeks   Status New               Plan - 08/01/16 1507    Clinical Impression Statement The patient presents with medical diagnoses of physical deconditioning and multi joint osteoarthritis.  He has right knee pain and neck pain but he reports his primary complaint is weakness limiting his ability to stand and walk and frequent  falls (3x in the last 6 months.).  He uses a 4 wheeled walker almost full time but uses a cane in the house sometimes.   His gait distance even with the walker is very limited approx 40 feet.   When he goes to the baseball game or to [employer] with his grandchildren he uses a wheelchair.  Difficulty rising from sit to stand without UE use.  Right LE strength grossly 3+/5, left 4-/5.  Decreased core strength 3+/5.  Decreased HS and hip flexor muscle lengths.  BERG score 29/56 indicating he is at 100% risk of falls and needs walker full time for indoor and outdoor terrain.  Timed up and Go 19 sec (norm 12.7).  The patient is of moderate complexity evaluation due to an evolving condition (progressive worsening over time) and numerous co-morbidities including diabetes, neuropathy, CHF, CAD, previous spinal surgery and multi joint arthritis.     Rehab Potential Good   Clinical Impairments Affecting Rehab Potential DM with LE neuropathy, CAD, CHF, vertigo, Bilateral TKR 10+ years ago   PT Frequency 2x / week   PT Duration 8 weeks   PT Treatment/Interventions ADLs/Self Care Home Management;Electrical Stimulation;Cryotherapy;Moist Heat;Ultrasound;Balance training;Therapeutic exercise;Neuromuscular re-education;Patient/family education;Manual techniques;Taping   PT Next Visit Plan Check 6 min walk test capacity (expect numerous rest breaks will be needed; also check Oxygen saturation);  start Nu-Step;  initiate low level supine or seated LE stretching and strengthening for basic HEP   Recommended Other Services Patient/wife interested in joining [employer] program at [employer] upon discharge   Consulted and Agree with Plan of Care Family member/caregiver      Patient will benefit from skilled therapeutic intervention in order to improve the following deficits and impairments:  Abnormal gait, Decreased activity tolerance, Decreased balance, Decreased mobility, Decreased endurance, Decreased  range of motion, Difficulty walking, Impaired flexibility, Pain, Postural dysfunction  Visit Diagnosis: Muscle weakness (generalized) - Plan: PT plan of care cert/re-cert  Repeated falls - Plan: PT plan of care cert/re-cert  Difficulty in walking, not elsewhere classified - Plan: PT plan of care cert/re-cert  Pain in right knee - Plan: PT plan of care cert/re-cert      G-Codes - [id number] 1530    Functional Assessment Tool Used BERG; Timed up and Go; clinical judgement   Functional Limitation Mobility: Walking and moving around   Mobility: Walking and Moving Around Current  Status [phone]) At least 60 percent but less than 80 percent impaired, limited or restricted   Mobility: Walking and Moving Around Goal Status [phone]) At least 40 percent but less than 60 percent impaired, limited or restricted       Problem List Patient Active Problem List   Diagnosis Date Noted  . OAB (overactive bladder) 03/26/2015  . Chronic diastolic congestive heart failure ([place]) 11/23/2014  . CAD (coronary artery disease) 10/14/2014  . Diabetic peripheral neuropathy associated with type 2 diabetes mellitus ([place]) 05/30/2010  . Essential hypertension 08/20/2007  . Uncontrolled type 2 diabetes mellitus with peripheral circulatory disorder ([place]) 06/13/2007  . Hyperlipemia 06/13/2007  . GERD 06/13/2007   Ruben Im, PT 08/01/16 3:34 PM Phone: [phone] Fax: [phone] Alvera Singh 08/01/2016, 3:33 PM  [facility] [address] [place], [place], [address] Phone: [phone]   Fax:  [phone]  Name: [name] MRN: [id number] Date of Birth: 1933-10-11

## 2016-08-02 ENCOUNTER — Ambulatory Visit: Payer: Medicare Other | Admitting: Physical Therapy

## 2016-08-02 ENCOUNTER — Encounter: Payer: Self-pay | Admitting: Physical Therapy

## 2016-08-02 DIAGNOSIS — M6281 Muscle weakness (generalized): Secondary | ICD-10-CM | POA: Diagnosis not present

## 2016-08-02 DIAGNOSIS — R296 Repeated falls: Secondary | ICD-10-CM

## 2016-08-02 DIAGNOSIS — R262 Difficulty in walking, not elsewhere classified: Secondary | ICD-10-CM

## 2016-08-02 DIAGNOSIS — M25561 Pain in right knee: Secondary | ICD-10-CM

## 2016-08-02 NOTE — Patient Instructions (Signed)
Levator Stretch   Grasp seat or sit on hand on side to be stretched. Turn head toward other side and look down. Use hand on head to gently stretch neck in that position. Hold ____ seconds. Repeat on other side. Repeat ____ times. Do ____ sessions per day.  http://gt2.exer.us/30   Copyright  VHI. All rights reserved.  Side-Bending   One hand on opposite side of head, pull head to side as far as is comfortable. Stop if there is pain. Hold ____ seconds. Repeat with other hand to other side. Repeat ____ times. Do ____ sessions per day.   Copyright  VHI. All rights reserved.  Scapular Retraction (Standing)   With arms at sides, pinch shoulder blades together. Repeat ____ times per set. Do ____ sets per session. Do ____ sessions per day.  http://orth.exer.us/944   Copyright  VHI. All rights reserved.  [facility] [address] [place], [place] [address] Phone # [phone] Fax [phone]

## 2016-08-02 NOTE — Therapy (Signed)
[facility] [address] [place], [place], [address] Phone: [phone]   Fax:  [phone]  Physical Therapy Treatment  Patient Details  Name: [name] MRN: [id number] Date of Birth: May 10, 1933 Referring Provider: Colin Benton DO  Encounter Date: 08/02/2016      PT End of Session - 08/02/16 1314    Visit Number 2   Number of Visits 10   Date for PT Re-Evaluation 09/26/16   Authorization Type Medicare G codes:  KX at visit 15   PT Start Time 1237   PT Stop Time 1330   PT Time Calculation (min) 53 min   Activity Tolerance Patient limited by fatigue   Behavior During Therapy [facility] for tasks assessed/performed      Past Medical History:  Diagnosis Date  . Anemia   . Atherosclerotic PVD with intermittent claudication ([place])   . CAD (coronary artery disease)   . Diabetes mellitus    type 2 with peripheral neuropathy  . Diastolic CHF, chronic ([place])   . Diverticulosis   . DVT (deep venous thrombosis) ([place])   . Dyslipidemia   . Edema 08/19/2014  . GERD (gastroesophageal reflux disease)    hx hiatal hernia  . HTN (hypertension)   . IBS (irritable bowel syndrome)    Hx: of  . Osteoarthritis   . Overactive bladder    Hx: of stress incontinence  . Pneumonia 2002   history of  . Shingles   . Varicose veins of lower extremities with other complications 0000000    Past Surgical History:  Procedure Laterality Date  . BACK SURGERY    . CATARACT EXTRACTION Bilateral 08/10/2015,08/15/2015  . COLONOSCOPY     Hx: of  . CTS bilateral    . KNEE ARTHROSCOPY  1988, 1992   bilateral  . LAMINECTOMY  08/18/2013   DECOMPRESSIVE LAMINECTOMIES  . LEFT AND RIGHT HEART CATHETERIZATION WITH CORONARY ANGIOGRAM N/A 09/03/2014   Procedure: LEFT AND RIGHT HEART CATHETERIZATION WITH CORONARY ANGIOGRAM;  Surgeon: [name], MD;  Location: [facility] CATH LAB;  Service: Cardiovascular;  Laterality: N/A;  . LUMBAR  LAMINECTOMY/DECOMPRESSION MICRODISCECTOMY N/A 08/18/2013   Procedure: Lumbar two-three, lumbar three-four, lumbar four-five decompressive laminectomies;  Surgeon: [name], MD;  Location: [place] NEURO [facility]S;  Service: Neurosurgery;  Laterality: N/A;  . lumbar left arm  1961  . TONSILLECTOMY    . TOTAL KNEE ARTHROPLASTY  2005, 2007  . wrist tendon surgery      There were no vitals filed for this visit.      Subjective Assessment - 08/02/16 1239    Subjective Pt reports some neck pain today. Has not been very activie so it is not so bad.    Pertinent History Right TKR, DM, Vertigo, CAD, CHF;   How long can you sit comfortably? as long as I want to but when I get up achy right hip, thigh;  lumbar discectomy/discectomy   How long can you walk comfortably? 10-15 yards then get weak feeling all over, some shortness of breath   Diagnostic tests X-ray of neck    Patient Stated Goals hopefully get my balance straightened out;  get stronger so I can walk better   Currently in Pain? Yes   Pain Score 5    Pain Location Neck   Pain Orientation Right;Left   Pain Descriptors / Indicators Tightness   Pain Type Chronic pain   Pain Relieving Factors Rest, heat pack on neck, tylenol  [facility] Adult PT Treatment/Exercise - 08/02/16 0001      Exercises   Exercises Neck;Shoulder;Lumbar;Knee/Hip     Neck Exercises: Theraband   Rows 20 reps   Horizontal ABduction 20 reps     Knee/Hip Exercises: Aerobic   Nustep L1 x 5 mins  seat 11 arms 13     Knee/Hip Exercises: Seated   Long Arc Quad AROM;Strengthening;Both;2 sets;10 reps;Weights  #2   Ball Squeeze 30   Clamshell with TheraBand Red   Marching AROM;Strengthening;Both;2 sets;10 reps     Shoulder Exercises: Seated   Row AROM;Strengthening;Both;20 reps;Theraband   Horizontal ABduction AROM;Strengthening;Both;20 reps;Theraband   External Rotation AROM;Strengthening;Both;20 reps;Theraband     Modalities    Modalities Electrical Stimulation;Moist Heat     Moist Heat Therapy   Number Minutes Moist Heat 15 Minutes   Moist Heat Location Cervical     Electrical Stimulation   Electrical Stimulation Location Bil upper traps   Electrical Stimulation Action IFC   Electrical Stimulation Parameters 9   Electrical Stimulation Goals Pain     Neck Exercises: Stretches   Upper Trapezius Stretch 2 reps;10 seconds   Levator Stretch 2 reps;10 seconds                PT Education - 08/02/16 1318    Education provided Yes   Education Details Neck stretches and shoulder strength   Person(s) Educated Patient   Methods Demonstration;Explanation;Handout   Comprehension Verbalized understanding;Returned demonstration          PT Short Term Goals - 08/01/16 1519      PT SHORT TERM GOAL #1   Title The patient will demonstrate compliance with basic HEP to improve LE muscle lengths and initial LE strengthening   08/29/16   Time 4   Period Weeks   Status New     PT SHORT TERM GOAL #2   Title The patient will have improved Timed up and Go test to 17 sec indicating improved gait speed for short distance ambulation   Time 4   Period Weeks   Status New     PT SHORT TERM GOAL #3   Title BERG balance test score improved to 34/56 indicating improved static and dynamic balance for decreased fall risk   Time 4   Period Weeks   Status New     PT SHORT TERM GOAL #4   Title HS length improved to 55 degrees needed for improved standing and walking tolerance   Time 4   Period Weeks   Status New           PT Long Term Goals - 08/01/16 1524      PT LONG TERM GOAL #1   Title The patient will be independent in a progressive HEP and appropriate Silver Sneakers program for further progress and improvements in mobility   09/26/16   Time 8   Period Weeks   Status New     PT LONG TERM GOAL #2   Title The patient will report a 50% improvement in pain and function with ADLS   Time 8   Period  Weeks   Status New     PT LONG TERM GOAL #3   Title The patient will have bilateral LE strength to grossly 4/5 to be able to stand 5 minutes for shaving, showering and personal care   Time 8   Period Weeks   Status New     PT LONG TERM GOAL #4   Title Patient will be able  to ambulate 240 feet with RW needed for short distance community ambulation    Time 8   Period Weeks   Status New     PT LONG TERM GOAL #5   Title BERG balance test score improved to 36/56 indicating decreased fall risk   Time 8   Period Weeks   Status New     Additional Long Term Goals   Additional Long Term Goals Yes     PT LONG TERM GOAL #6   Title Timed up and Go test improved to 14 sec indicating improved gait speed for community ambulation   Time 8   Period Weeks   Status New               Plan - 08/02/16 1314    Clinical Impression Statement Pt presents with global weakness and multi joint OA. Pt uses 4 wheeled RW for amb. Slouched foward flexed posture. Very stiff neck with decreased ROM. Pt reports having occasional increased right sided neck pain with exercises. Able to tolerate all stregnthening exercises well with short rest breaks in between. Will continue to strengthen globally and progress to standing exercises.    Rehab Potential Good   Clinical Impairments Affecting Rehab Potential DM with LE neuropathy, CAD, CHF, vertigo, Bilateral TKR 10+ years ago   PT Frequency 2x / week   PT Duration 8 weeks   PT Treatment/Interventions ADLs/Self Care Home Management;Electrical Stimulation;Cryotherapy;Moist Heat;Ultrasound;Balance training;Therapeutic exercise;Neuromuscular re-education;Patient/family education;Manual techniques;Taping   PT Next Visit Plan Check 6 min walk test capacity (expect numerous rest breaks will be needed; also check Oxygen saturation);  start Nu-Step;  initiate low level supine or seated LE stretching and strengthening for basic HEP   PT Home Exercise Plan Pt given neck  stretches and strengthening exercises.   Consulted and Agree with Plan of Care Patient      Patient will benefit from skilled therapeutic intervention in order to improve the following deficits and impairments:  Abnormal gait, Decreased activity tolerance, Decreased balance, Decreased mobility, Decreased endurance, Decreased range of motion, Difficulty walking, Impaired flexibility, Pain, Postural dysfunction  Visit Diagnosis: Muscle weakness (generalized)  Repeated falls  Difficulty in walking, not elsewhere classified  Pain in right knee       G-Codes - Aug 31, 2016 1530    Functional Assessment Tool Used BERG; Timed up and Go; clinical judgement   Functional Limitation Mobility: Walking and moving around   Mobility: Walking and Moving Around Current Status [phone]) At least 60 percent but less than 80 percent impaired, limited or restricted   Mobility: Walking and Moving Around Goal Status [phone]) At least 40 percent but less than 60 percent impaired, limited or restricted      Problem List Patient Active Problem List   Diagnosis Date Noted  . OAB (overactive bladder) 03/26/2015  . Chronic diastolic congestive heart failure ([place]) 11/23/2014  . CAD (coronary artery disease) 10/14/2014  . Diabetic peripheral neuropathy associated with type 2 diabetes mellitus ([place]) 05/30/2010  . Essential hypertension 08/20/2007  . Uncontrolled type 2 diabetes mellitus with peripheral circulatory disorder ([place]) 06/13/2007  . Hyperlipemia 06/13/2007  . GERD 06/13/2007    Mikle Bosworth PTA 08/02/2016, 1:32 PM  [facility] [address] [place], [place], [address] Phone: [phone]   Fax:  [phone]  Name: AZAI KIRKMAN MRN: [id number] Date of Birth: [birth year]

## 2016-08-08 ENCOUNTER — Ambulatory Visit: Payer: Medicare Other | Admitting: Physical Therapy

## 2016-08-08 DIAGNOSIS — M6281 Muscle weakness (generalized): Secondary | ICD-10-CM

## 2016-08-08 DIAGNOSIS — M25561 Pain in right knee: Secondary | ICD-10-CM

## 2016-08-08 DIAGNOSIS — R296 Repeated falls: Secondary | ICD-10-CM

## 2016-08-08 DIAGNOSIS — R262 Difficulty in walking, not elsewhere classified: Secondary | ICD-10-CM

## 2016-08-08 NOTE — Therapy (Signed)
[facility] [address] [place], [place], [address] Phone: [phone]   Fax:  [phone]  Physical Therapy Treatment  Patient Details  Name: [name] MRN: [id number] Date of Birth: 09-20-1933 Referring Provider: Colin Benton DO  Encounter Date: 08/08/2016      PT End of Session - 08/08/16 1122    Visit Number 3   Number of Visits 10   Date for PT Re-Evaluation 09/26/16   Authorization Type Medicare G codes:  KX at visit 15   PT Start Time 1100   PT Stop Time 1155   PT Time Calculation (min) 55 min   Activity Tolerance Patient tolerated treatment well      Past Medical History:  Diagnosis Date  . Anemia   . Atherosclerotic PVD with intermittent claudication ([place])   . CAD (coronary artery disease)   . Diabetes mellitus    type 2 with peripheral neuropathy  . Diastolic CHF, chronic ([place])   . Diverticulosis   . DVT (deep venous thrombosis) ([place])   . Dyslipidemia   . Edema 08/19/2014  . GERD (gastroesophageal reflux disease)    hx hiatal hernia  . HTN (hypertension)   . IBS (irritable bowel syndrome)    Hx: of  . Osteoarthritis   . Overactive bladder    Hx: of stress incontinence  . Pneumonia 2002   history of  . Shingles   . Varicose veins of lower extremities with other complications 0000000    Past Surgical History:  Procedure Laterality Date  . BACK SURGERY    . CATARACT EXTRACTION Bilateral 08/10/2015,08/15/2015  . COLONOSCOPY     Hx: of  . CTS bilateral    . KNEE ARTHROSCOPY  1988, 1992   bilateral  . LAMINECTOMY  08/18/2013   DECOMPRESSIVE LAMINECTOMIES  . LEFT AND RIGHT HEART CATHETERIZATION WITH CORONARY ANGIOGRAM N/A 09/03/2014   Procedure: LEFT AND RIGHT HEART CATHETERIZATION WITH CORONARY ANGIOGRAM;  Surgeon: [name], MD;  Location: [facility] CATH LAB;  Service: Cardiovascular;  Laterality: N/A;  . LUMBAR LAMINECTOMY/DECOMPRESSION MICRODISCECTOMY N/A 08/18/2013   Procedure:  Lumbar two-three, lumbar three-four, lumbar four-five decompressive laminectomies;  Surgeon: [name], MD;  Location: [place] [facility]URO [facility]S;  Service: Neurosurgery;  Laterality: N/A;  . lumbar left arm  1961  . TONSILLECTOMY    . TOTAL KNEE ARTHROPLASTY  2005, 2007  . wrist tendon surgery      There were no vitals filed for this visit.      Subjective Assessment - 08/08/16 1102    Subjective My neck has been hurting some so I've been using heat.  I can't tell my balance is better yet.  I've been doing my exercises 2-3x/day.  My right knee doesn't pop now like it used to.   Currently in Pain? Yes   Pain Score 5    Pain Location Neck   Pain Orientation Right;Left   Pain Type Chronic pain                         [facility] Adult PT Treatment/Exercise - 08/08/16 0001      Knee/Hip Exercises: Stretches   Other Knee/Hip Stretches doorway psoas stetch 3x 5 right and left   Other Knee/Hip Stretches push downs on walker seated 10x 5 sec holds     Knee/Hip Exercises: Aerobic   Nustep L1 8 min     Knee/Hip Exercises: Standing   Other Standing Knee Exercises weight shifting  4 ways with UE support and no support   Other Standing Knee Exercises sit to stand from very high plinth no UEs 10x     Knee/Hip Exercises: Seated   Long Arc Quad AROM;Strengthening;Both;2 sets;10 reps;Weights  #2   Clamshell with TheraBand Red  20 x   Other Seated Knee/Hip Exercises 2# plyo ball core series 1 min each x4     Moist Heat Therapy   Number Minutes Moist Heat 15 Minutes   Moist Heat Location Cervical     Electrical Stimulation   Electrical Stimulation Location Bil upper traps   Electrical Stimulation Action IFC   Electrical Stimulation Parameters 7 ma 15 min   Electrical Stimulation Goals Pain                  PT Short Term Goals - 08/08/16 1123      PT SHORT TERM GOAL #1   Title The patient will demonstrate compliance with basic HEP to improve LE muscle lengths and  initial LE strengthening   08/29/16   Time 4   Period Weeks   Status On-going     PT SHORT TERM GOAL #2   Title The patient will have improved Timed up and Go test to 17 sec indicating improved gait speed for short distance ambulation   Time 4   Period Weeks   Status On-going     PT SHORT TERM GOAL #3   Title BERG balance test score improved to 34/56 indicating improved static and dynamic balance for decreased fall risk   Time 4   Period Weeks   Status On-going     PT SHORT TERM GOAL #4   Title HS length improved to 55 degrees needed for improved standing and walking tolerance   Time 4   Period Weeks   Status On-going           PT Long Term Goals - 08/08/16 1123      PT LONG TERM GOAL #1   Title The patient will be independent in a progressive HEP and appropriate Silver Sneakers program for further progress and improvements in mobility   09/26/16   Time 8   Period Weeks   Status On-going     PT LONG TERM GOAL #2   Title The patient will report a 50% improvement in pain and function with ADLS   Time 8   Period Weeks   Status On-going     PT LONG TERM GOAL #3   Title The patient will have bilateral LE strength to grossly 4/5 to be able to stand 5 minutes for shaving, showering and personal care   Time 8   Period Weeks   Status On-going     PT LONG TERM GOAL #4   Title Patient will be able to ambulate 240 feet with RW needed for short distance community ambulation    Time 8   Period Weeks   Status On-going     PT LONG TERM GOAL #5   Title BERG balance test score improved to 36/56 indicating decreased fall risk   Time 8   Period Weeks   Status On-going     PT LONG TERM GOAL #6   Title Timed up and Go test improved to 14 sec indicating improved gait speed for community ambulation   Time 8   Period Weeks   Status On-going               Plan - 08/08/16 1124  Clinical Impression Statement The patient has multi joint pain.  He is able to participate  in low level seated and standing exercises but reports LE muscular fatigue during session requiring 2 rest breaks.  No right knee crepitus appreciated (significant change from initial eval).  Good neck pain relief with e-stim/heat.  Therapist closely monitoring pain, fatigue level and providing CGA for safety in standing.     PT Next Visit Plan Nu-Step;  LE stretching;  LE strengthening in sitting, supine and standing;  core activation low level ex;  e-stim/heat for neck pain as needed      Patient will benefit from skilled therapeutic intervention in order to improve the following deficits and impairments:     Visit Diagnosis: Muscle weakness (generalized)  Repeated falls  Difficulty in walking, not elsewhere classified  Pain in right knee     Problem List Patient Active Problem List   Diagnosis Date Noted  . OAB (overactive bladder) 03/26/2015  . Chronic diastolic congestive heart failure ([place]) 11/23/2014  . CAD (coronary artery disease) 10/14/2014  . Diabetic peripheral neuropathy associated with type 2 diabetes mellitus ([place]) 05/30/2010  . Essential hypertension 08/20/2007  . Uncontrolled type 2 diabetes mellitus with peripheral circulatory disorder ([place]) 06/13/2007  . Hyperlipemia 06/13/2007  . GERD 06/13/2007   Ruben Im, PT 08/08/16 4:11 PM Phone: [phone] Fax: [phone]  Alvera Singh 08/08/2016, 4:10 PM  [facility] [address] [place], [place], [address] Phone: [phone]   Fax:  [phone]  Name: [name] MRN: [id number] Date of Birth: [birth year]

## 2016-08-10 ENCOUNTER — Ambulatory Visit: Payer: Medicare Other | Admitting: Physical Therapy

## 2016-08-10 DIAGNOSIS — M6281 Muscle weakness (generalized): Secondary | ICD-10-CM | POA: Diagnosis not present

## 2016-08-10 DIAGNOSIS — M25561 Pain in right knee: Secondary | ICD-10-CM

## 2016-08-10 DIAGNOSIS — R296 Repeated falls: Secondary | ICD-10-CM

## 2016-08-10 DIAGNOSIS — R262 Difficulty in walking, not elsewhere classified: Secondary | ICD-10-CM

## 2016-08-10 NOTE — Patient Instructions (Signed)
Leg Extension (Hamstring)   Sit toward front edge of chair, with leg out straight, heel on floor, toes pointing toward body. Keeping back straight, bend forward at hip, breathing out through pursed lips. Return, breathing in. Repeat __3_ times. Repeat with other leg. Do __2_ sessions per day. Variation: Perform from standing position, with support.  Hamstring Step 1   Straighten left knee. Keep knee level with other knee or on bolster. Hold _30__ seconds. Relax knee by returning foot to start. Repeat 3___ times.  Ruben Im PT [facility] [address], [place], [place] [address] Phone # [phone] Fax [phone]

## 2016-08-10 NOTE — Therapy (Signed)
[facility] [address] [place], [place], [address] Phone: [phone]   Fax:  [phone]  Physical Therapy Treatment  Patient Details  Name: [name] MRN: [id number] Date of Birth: December 16, 1932 Referring Provider: Colin Benton DO  Encounter Date: 08/10/2016      PT End of Session - 08/10/16 0948    Visit Number 4   Number of Visits 10   Date for PT Re-Evaluation 09/26/16   Authorization Type Medicare G codes:  KX at visit 15   PT Start Time 0925   PT Stop Time 1010   PT Time Calculation (min) 45 min   Activity Tolerance Patient tolerated treatment well      Past Medical History:  Diagnosis Date  . Anemia   . Atherosclerotic PVD with intermittent claudication ([place])   . CAD (coronary artery disease)   . Diabetes mellitus    type 2 with peripheral neuropathy  . Diastolic CHF, chronic ([place])   . Diverticulosis   . DVT (deep venous thrombosis) ([place])   . Dyslipidemia   . Edema 08/19/2014  . GERD (gastroesophageal reflux disease)    hx hiatal hernia  . HTN (hypertension)   . IBS (irritable bowel syndrome)    Hx: of  . Osteoarthritis   . Overactive bladder    Hx: of stress incontinence  . Pneumonia 2002   history of  . Shingles   . Varicose veins of lower extremities with other complications 0000000    Past Surgical History:  Procedure Laterality Date  . BACK SURGERY    . CATARACT EXTRACTION Bilateral 08/10/2015,08/15/2015  . COLONOSCOPY     Hx: of  . CTS bilateral    . KNEE ARTHROSCOPY  1988, 1992   bilateral  . LAMINECTOMY  08/18/2013   DECOMPRESSIVE LAMINECTOMIES  . LEFT AND RIGHT HEART CATHETERIZATION WITH CORONARY ANGIOGRAM N/A 09/03/2014   Procedure: LEFT AND RIGHT HEART CATHETERIZATION WITH CORONARY ANGIOGRAM;  Surgeon: [name], MD;  Location: [facility] CATH LAB;  Service: Cardiovascular;  Laterality: N/A;  . LUMBAR LAMINECTOMY/DECOMPRESSION MICRODISCECTOMY N/A 08/18/2013   Procedure:  Lumbar two-three, lumbar three-four, lumbar four-five decompressive laminectomies;  Surgeon: [name], MD;  Location: [facility]UR[facility];  Service: Neurosurgery;  Laterality: N/A;  . lumbar left arm  1961  . TONSILLECTOMY    . TOTAL KNEE ARTHROPLASTY  2005, 2007  . wrist tendon surgery      There were no vitals filed for this visit.      Subjective Assessment - 08/10/16 0927    Subjective Reports mostly leg soreness after last visit.  States his neck pain is no worse/no better.  Declines e-stim/heat secondary to another appointment.  I give out from my waist to my knees with walking.  "Since I've been coming here, that right knee is doing better.  I trust it now."   Currently in Pain? Yes   Pain Score 5    Pain Location Neck   Pain Type Chronic pain   Aggravating Factors  when I first get up                         [facility] Adult PT Treatment/Exercise - 08/10/16 0001      Neck Exercises: Seated   Other Seated Exercise yellow band rows and extensions 10x each     Lumbar Exercises: Stretches   Active Hamstring Stretch 2 reps;30 seconds     Knee/Hip Exercises: Aerobic  Nustep L1 9 min     Knee/Hip Exercises: Standing   Hip ADduction AROM;Right;Left;1 set;10 reps   Hip Extension AROM;Right;Left;1 set;10 reps   Other Standing Knee Exercises weight shifting 4 ways with UE support and no support   Other Standing Knee Exercises step taps 4 inches 10x     Knee/Hip Exercises: Seated   Long Arc Quad Strengthening;Right;Left;1 set;10 reps   Long Arc Quad Limitations red band   Ball Squeeze 25x   Clamshell with TheraBand Red   Other Seated Knee/Hip Exercises hip flexion ankle DF red band  10x red band   Sit to [employer] 10 reps;without UE support  from very high plinth                PT Education - 08/10/16 1845    Education provided Yes   Education Details seated and supine HS stretch   Person(s) Educated Patient   Methods  Explanation;Demonstration;Handout   Comprehension Verbalized understanding;Returned demonstration          PT Short Term Goals - 08/10/16 1850      PT SHORT TERM GOAL #1   Title The patient will demonstrate compliance with basic HEP to improve LE muscle lengths and initial LE strengthening   08/29/16   Time 4   Period Weeks   Status On-going     PT SHORT TERM GOAL #2   Title The patient will have improved Timed up and Go test to 17 sec indicating improved gait speed for short distance ambulation   Time 4   Period Weeks   Status On-going     PT SHORT TERM GOAL #3   Title BERG balance test score improved to 34/56 indicating improved static and dynamic balance for decreased fall risk   Time 4   Period Weeks   Status On-going     PT SHORT TERM GOAL #4   Title HS length improved to 55 degrees needed for improved standing and walking tolerance   Time 4   Period Weeks   Status On-going           PT Long Term Goals - 08/10/16 1850      PT LONG TERM GOAL #1   Title The patient will be independent in a progressive HEP and appropriate Silver Sneakers program for further progress and improvements in mobility   09/26/16   Time 8   Period Weeks   Status On-going     PT LONG TERM GOAL #2   Title The patient will report a 50% improvement in pain and function with ADLS   Time 8   Period Weeks   Status On-going     PT LONG TERM GOAL #3   Title The patient will have bilateral LE strength to grossly 4/5 to be able to stand 5 minutes for shaving, showering and personal care   Time 8   Period Weeks   Status On-going     PT LONG TERM GOAL #4   Title Patient will be able to ambulate 240 feet with RW needed for short distance community ambulation    Time 8   Period Weeks   Status On-going     PT LONG TERM GOAL #5   Title BERG balance test score improved to 36/56 indicating decreased fall risk   Time 8   Period Weeks   Status On-going     PT LONG TERM GOAL #6   Title Timed  up and Go test improved to 14 sec indicating  improved gait speed for community ambulation   Time 8   Period Weeks   Status On-going               Plan - 08/10/16 1846    Clinical Impression Statement The patient continues to be very limited to standing tolerance secondary to back and LE pain.  Pain relieved in sitting.  He lacks bilateral ankle DF and PF motor control (most likely from neuropathy) which affects his balance with forward and backward weight shifting.  Progress will be slowed by numerous co-morbidities.  He declines modalities today b/c  he has another appt following but states this has helped with his neck pain on previous visits.  Therapist closely monitoring response and modifying as needed for pain.  Close supervision for safety.     PT Next Visit Plan Nu-Step;  LE stretching;  LE strengthening in sitting, supine and standing;  core activation low level ex;  e-stim/heat for neck pain as needed      Patient will benefit from skilled therapeutic intervention in order to improve the following deficits and impairments:     Visit Diagnosis: Muscle weakness (generalized)  Repeated falls  Difficulty in walking, not elsewhere classified  Pain in right knee     Problem List Patient Active Problem List   Diagnosis Date Noted  . OAB (overactive bladder) 03/26/2015  . Chronic diastolic congestive heart failure ([place]) 11/23/2014  . CAD (coronary artery disease) 10/14/2014  . Diabetic peripheral neuropathy associated with type 2 diabetes mellitus ([place]) 05/30/2010  . Essential hypertension 08/20/2007  . Uncontrolled type 2 diabetes mellitus with peripheral circulatory disorder ([place]) 06/13/2007  . Hyperlipemia 06/13/2007  . GERD 06/13/2007    Alvera Singh 08/10/2016, 6:52 PM  [facility] [address] [place], [place], [address] Phone: [phone]   Fax:  [phone]  Name: [name] MRN:  [id number] Date of Birth: 08-May-1933

## 2016-08-15 ENCOUNTER — Ambulatory Visit: Payer: Medicare Other | Attending: Family Medicine | Admitting: Physical Therapy

## 2016-08-15 DIAGNOSIS — R262 Difficulty in walking, not elsewhere classified: Secondary | ICD-10-CM | POA: Diagnosis present

## 2016-08-15 DIAGNOSIS — M6281 Muscle weakness (generalized): Secondary | ICD-10-CM | POA: Diagnosis present

## 2016-08-15 DIAGNOSIS — M25561 Pain in right knee: Secondary | ICD-10-CM | POA: Diagnosis present

## 2016-08-15 DIAGNOSIS — R296 Repeated falls: Secondary | ICD-10-CM | POA: Diagnosis present

## 2016-08-15 NOTE — Therapy (Signed)
[facility] [address], [place], [place], [address] Phone: [phone]   Fax:  [phone]  Physical Therapy Treatment  Patient Details  Name: [name] MRN: [id number] Date of Birth: [birth year] Referring Provider: Colin Benton DO  Encounter Date: 08/15/2016      PT End of Session - 08/15/16 1031    Visit Number 5   Number of Visits 10   Date for PT Re-Evaluation 09/26/16   Authorization Type Medicare G codes:  KX at visit 15   PT Start Time 1015   PT Stop Time 1105   PT Time Calculation (min) 50 min   Activity Tolerance Patient tolerated treatment well      Past Medical History:  Diagnosis Date  . Anemia   . Atherosclerotic PVD with intermittent claudication ([place])   . CAD (coronary artery disease)   . Diabetes mellitus    type 2 with peripheral neuropathy  . Diastolic CHF, chronic ([place])   . Diverticulosis   . DVT (deep venous thrombosis) ([place])   . Dyslipidemia   . Edema 08/19/2014  . GERD (gastroesophageal reflux disease)    hx hiatal hernia  . HTN (hypertension)   . IBS (irritable bowel syndrome)    Hx: of  . Osteoarthritis   . Overactive bladder    Hx: of stress incontinence  . Pneumonia 2002   history of  . Shingles   . Varicose veins of lower extremities with other complications 0000000    Past Surgical History:  Procedure Laterality Date  . BACK SURGERY    . CATARACT EXTRACTION Bilateral 08/10/2015,08/15/2015  . COLONOSCOPY     Hx: of  . CTS bilateral    . KNEE ARTHROSCOPY  1988, 1992   bilateral  . LAMINECTOMY  08/18/2013   DECOMPRESSIVE LAMINECTOMIES  . LEFT AND RIGHT HEART CATHETERIZATION WITH CORONARY ANGIOGRAM N/A 09/03/2014   Procedure: LEFT AND RIGHT HEART CATHETERIZATION WITH CORONARY ANGIOGRAM;  Surgeon: [name], MD;  Location: [facility] CATH LAB;  Service: Cardiovascular;  Laterality: N/A;  . LUMBAR LAMINECTOMY/DECOMPRESSION MICRODISCECTOMY N/A 08/18/2013   Procedure:  Lumbar two-three, lumbar three-four, lumbar four-five decompressive laminectomies;  Surgeon: [name], MD;  Location: [facility]UR[facility]S;  Service: Neurosurgery;  Laterality: N/A;  . lumbar left arm  1961  . TONSILLECTOMY    . TOTAL KNEE ARTHROPLASTY  2005, 2007  . wrist tendon surgery      There were no vitals filed for this visit.      Subjective Assessment - 08/15/16 1017    Subjective I was really sore in my chest over the weekend.  I've had that checked out before and they said my heart was OK.  I think the (TENS) machine and heat help my neck but I feel pretty good this morning.     Currently in Pain? Yes   Pain Score 0-No pain   Pain Type Chronic pain                         [facility] Adult PT Treatment/Exercise - 08/15/16 0001      Lumbar Exercises: Supine   Bridge 10 reps   Bridge Limitations with LEs over ball      Knee/Hip Exercises: Stretches   Active Hamstring Stretch Right;Left;3 reps;30 seconds   Active Hamstring Stretch Limitations in supine with strap     Knee/Hip Exercises: Aerobic   Nustep L1 10 min     Knee/Hip Exercises: Standing  Other Standing Knee Exercises weight shifting 4 ways with UE support and no support     Knee/Hip Exercises: Seated   Long Arc Quad Strengthening;Right;Left;1 set;10 reps   Long Arc Quad Limitations red band   Other Seated Knee/Hip Exercises hip flexion ankle DF red band  10x red band   Sit to [employer] 10 reps;without UE support  from very high plinth     Moist Heat Therapy   Number Minutes Moist Heat 15 Minutes   Moist Heat Location Cervical     Electrical Stimulation   Electrical Stimulation Location Bil upper traps   Electrical Stimulation Action IFC   Electrical Stimulation Parameters 7 ma 15 min   Electrical Stimulation Goals Pain     Staggered stand weight shifting 8x right and left.  Needs min assist secondary to loss of balance.               PT Short Term Goals - 08/15/16 1035       PT SHORT TERM GOAL #1   Title The patient will demonstrate compliance with basic HEP to improve LE muscle lengths and initial LE strengthening   08/29/16   Time 4   Period Weeks   Status On-going     PT SHORT TERM GOAL #2   Title The patient will have improved Timed up and Go test to 17 sec indicating improved gait speed for short distance ambulation   Time 4   Period Weeks   Status On-going     PT SHORT TERM GOAL #3   Title BERG balance test score improved to 34/56 indicating improved static and dynamic balance for decreased fall risk   Time 4   Status On-going     PT SHORT TERM GOAL #4   Title HS length improved to 55 degrees needed for improved standing and walking tolerance   Time 4   Period Weeks   Status On-going           PT Long Term Goals - 08/15/16 1036      PT LONG TERM GOAL #1   Title The patient will be independent in a progressive HEP and appropriate Silver Sneakers program for further progress and improvements in mobility   09/26/16   Time 8   Period Weeks   Status On-going     PT LONG TERM GOAL #2   Title The patient will report a 50% improvement in pain and function with ADLS   Period Weeks   Status On-going     PT LONG TERM GOAL #3   Title The patient will have bilateral LE strength to grossly 4/5 to be able to stand 5 minutes for shaving, showering and personal care   Time 8   Period Weeks   Status On-going     PT LONG TERM GOAL #4   Title Patient will be able to ambulate 240 feet with RW needed for short distance community ambulation    Time 8   Period Weeks   Status On-going     PT LONG TERM GOAL #5   Title BERG balance test score improved to 36/56 indicating decreased fall risk   Time 8   Period Weeks   Status On-going     PT LONG TERM GOAL #6   Title Timed up and Go test improved to 14 sec indicating improved gait speed for community ambulation   Time 8   Period Weeks   Status On-going  Plan - 08/15/16 1032     Clinical Impression Statement The patient fatigues quickly with ex.  Pain in back and LEs with standing/walking and relieved with sitting.  Multi region joint pain slows progress somewhat but patient is highly motivated and has good home support.  On target to meet STGS in 1-2 weeks.   Therapist closely monitoring response and modifying as needed for pain and fatigue.    PT Next Visit Plan Nu-Step;  LE stretching;  LE strengthening in sitting, supine and standing;  core activation low level ex;  e-stim/heat for neck pain as needed      Patient will benefit from skilled therapeutic intervention in order to improve the following deficits and impairments:     Visit Diagnosis: Muscle weakness (generalized)  Repeated falls  Difficulty in walking, not elsewhere classified  Pain in right knee     Problem List Patient Active Problem List   Diagnosis Date Noted  . OAB (overactive bladder) 03/26/2015  . Chronic diastolic congestive heart failure ([place]) 11/23/2014  . CAD (coronary artery disease) 10/14/2014  . Diabetic peripheral neuropathy associated with type 2 diabetes mellitus ([place]) 05/30/2010  . Essential hypertension 08/20/2007  . Uncontrolled type 2 diabetes mellitus with peripheral circulatory disorder ([place]) 06/13/2007  . Hyperlipemia 06/13/2007  . GERD 06/13/2007   Ruben Im, PT 08/15/16 10:53 AM Phone: [phone] Fax: [phone]  Alvera Singh 08/15/2016, 10:44 AM  [facility] [address] [place], [place], [address] Phone: [phone]   Fax:  [phone]  Name: [name] MRN: [id number] Date of Birth: 1933-10-29

## 2016-08-17 ENCOUNTER — Ambulatory Visit: Payer: Medicare Other | Admitting: Physical Therapy

## 2016-08-17 DIAGNOSIS — M25561 Pain in right knee: Secondary | ICD-10-CM

## 2016-08-17 DIAGNOSIS — M6281 Muscle weakness (generalized): Secondary | ICD-10-CM

## 2016-08-17 DIAGNOSIS — R296 Repeated falls: Secondary | ICD-10-CM

## 2016-08-17 DIAGNOSIS — R262 Difficulty in walking, not elsewhere classified: Secondary | ICD-10-CM

## 2016-08-17 NOTE — Therapy (Signed)
[facility] [address] [place], [place], [address] Phone: [phone]   Fax:  [phone]  Physical Therapy Treatment  Patient Details  Name: [name] MRN: [id number] Date of Birth: 1933/04/18 Referring Provider: Colin Benton DO  Encounter Date: 08/17/2016      PT End of Session - 08/17/16 1056    Visit Number 6   Number of Visits 10   Date for PT Re-Evaluation 09/26/16   Authorization Type Medicare G codes:  KX at visit 15   PT Start Time 1015   PT Stop Time 1110   PT Time Calculation (min) 55 min   Activity Tolerance Patient tolerated treatment well      Past Medical History:  Diagnosis Date  . Anemia   . Atherosclerotic PVD with intermittent claudication ([place])   . CAD (coronary artery disease)   . Diabetes mellitus    type 2 with peripheral neuropathy  . Diastolic CHF, chronic ([place])   . Diverticulosis   . DVT (deep venous thrombosis) ([place])   . Dyslipidemia   . Edema 08/19/2014  . GERD (gastroesophageal reflux disease)    hx hiatal hernia  . HTN (hypertension)   . IBS (irritable bowel syndrome)    Hx: of  . Osteoarthritis   . Overactive bladder    Hx: of stress incontinence  . Pneumonia 2002   history of  . Shingles   . Varicose veins of lower extremities with other complications 0000000    Past Surgical History:  Procedure Laterality Date  . BACK SURGERY    . CATARACT EXTRACTION Bilateral 08/10/2015,08/15/2015  . COLONOSCOPY     Hx: of  . CTS bilateral    . KNEE ARTHROSCOPY  1988, 1992   bilateral  . LAMINECTOMY  08/18/2013   DECOMPRESSIVE LAMINECTOMIES  . LEFT AND RIGHT HEART CATHETERIZATION WITH CORONARY ANGIOGRAM N/A 09/03/2014   Procedure: LEFT AND RIGHT HEART CATHETERIZATION WITH CORONARY ANGIOGRAM;  Surgeon: [name], MD;  Location: [facility] CATH LAB;  Service: Cardiovascular;  Laterality: N/A;  . LUMBAR LAMINECTOMY/DECOMPRESSION MICRODISCECTOMY N/A 08/18/2013   Procedure:  Lumbar two-three, lumbar three-four, lumbar four-five decompressive laminectomies;  Surgeon: [name], MD;  Location: [place] NEURO ORS;  Service: Neurosurgery;  Laterality: N/A;  . lumbar left arm  1961  . TONSILLECTOMY    . TOTAL KNEE ARTHROPLASTY  2005, 2007  . wrist tendon surgery      There were no vitals filed for this visit.      Subjective Assessment - 08/17/16 1027    Subjective Neck pain yesterday and some soreness in shoulders.   E-stim/heat relieves pain for 1 day.  "Today is really good, usually I'm hurting in the morning."     Currently in Pain? No/denies   Pain Score 0-No pain   Pain Location Neck   Pain Orientation Right;Left   Pain Type Chronic pain            [facility] PT Assessment - 08/17/16 0001      Flexibility   Hamstrings 55 degrees bilaterally                     [facility] Adult PT Treatment/Exercise - 08/17/16 0001      Lumbar Exercises: Stretches   Active Hamstring Stretch 3 reps;30 seconds   Active Hamstring Stretch Limitations with strap supine     Lumbar Exercises: Supine   Clam 10 reps   Clam Limitations red band with abdominal brace  Bent Knee Raise 10 reps   Bent Knee Raise Limitations red band around thighs   Bridge 10 reps   Bridge Limitations with LEs over ball      Knee/Hip Exercises: Aerobic   Nustep L1 10 min     Knee/Hip Exercises: Standing   Hip ADduction AROM;Right;Left;1 set;10 reps   Hip Extension AROM;Right;Left;1 set;10 reps   Other Standing Knee Exercises step taps 4 inches 10x     Knee/Hip Exercises: Seated   Long Arc Quad Strengthening;Right;Left;1 set;10 reps   Long Arc Quad Limitations red band   Other Seated Knee/Hip Exercises hip flexion ankle DF red band  10x red band   Sit to [employer] 10 reps;without UE support  from very high plinth     Moist Heat Therapy   Number Minutes Moist Heat 15 Minutes   Moist Heat Location Cervical     Electrical Stimulation   Electrical Stimulation Location Bil  upper traps   Electrical Stimulation Action IFC   Electrical Stimulation Parameters 7 ma 15 min   Electrical Stimulation Goals Pain                  PT Short Term Goals - 08/17/16 1102      PT SHORT TERM GOAL #1   Title The patient will demonstrate compliance with basic HEP to improve LE muscle lengths and initial LE strengthening   08/29/16   Status Achieved     PT SHORT TERM GOAL #2   Title The patient will have improved Timed up and Go test to 17 sec indicating improved gait speed for short distance ambulation   Time 4   Period Weeks   Status On-going     PT SHORT TERM GOAL #3   Title BERG balance test score improved to 34/56 indicating improved static and dynamic balance for decreased fall risk   Time 4   Period Weeks   Status On-going     PT SHORT TERM GOAL #4   Title HS length improved to 55 degrees needed for improved standing and walking tolerance   Status Achieved           PT Long Term Goals - 08/17/16 1103      PT LONG TERM GOAL #1   Title The patient will be independent in a progressive HEP and appropriate Silver Sneakers program for further progress and improvements in mobility   09/26/16   Time 8   Period Weeks   Status On-going     PT LONG TERM GOAL #2   Title The patient will report a 50% improvement in pain and function with ADLS   Time 8   Period Weeks   Status On-going     PT LONG TERM GOAL #3   Title The patient will have bilateral LE strength to grossly 4/5 to be able to stand 5 minutes for shaving, showering and personal care   Time 8   Period Weeks   Status On-going     PT LONG TERM GOAL #4   Title Patient will be able to ambulate 240 feet with RW needed for short distance community ambulation    Time 8   Period Weeks   Status On-going     PT LONG TERM GOAL #5   Title BERG balance test score improved to 36/56 indicating decreased fall risk   Time 8   Period Weeks   Status On-going     PT LONG TERM GOAL #6   Title Timed  up and Go test improved to 14 sec indicating improved gait speed for community ambulation   Time 8   Period Weeks   Status On-going               Plan - 08/17/16 1056    Clinical Impression Statement The patient reports LE fatigue with low level strengthening and low back pain with prolonged standing.  He reports functional improvements at home including greater ease with sit to stand and decreased right knee crepitus since starting therapy.  He is progressing with meeting goals although progress may be slowed by age and co-morbidites.  Therapist closely monitoring response and providing close supervision for safety.     PT Next Visit Plan Nu-Step;  LE stretching;  LE strengthening in sitting, supine and standing;  core activation low level ex;  e-stim/heat for neck pain as needed;  TUG and BERG in 1 week      Patient will benefit from skilled therapeutic intervention in order to improve the following deficits and impairments:     Visit Diagnosis: Muscle weakness (generalized)  Repeated falls  Difficulty in walking, not elsewhere classified  Pain in right knee     Problem List Patient Active Problem List   Diagnosis Date Noted  . OAB (overactive bladder) 03/26/2015  . Chronic diastolic congestive heart failure ([place]) 11/23/2014  . CAD (coronary artery disease) 10/14/2014  . Diabetic peripheral neuropathy associated with type 2 diabetes mellitus ([place]) 05/30/2010  . Essential hypertension 08/20/2007  . Uncontrolled type 2 diabetes mellitus with peripheral circulatory disorder ([place]) 06/13/2007  . Hyperlipemia 06/13/2007  . GERD 06/13/2007   Ruben Im, PT 08/17/16 11:05 AM Phone: [phone] Fax: [phone]  Alvera Singh 08/17/2016, 11:05 AM  [facility] [address] [place], [place], [address] Phone: [phone]   Fax:  [phone]  Name: [name] MRN: [id number] Date of Birth:  [birth year]

## 2016-08-22 ENCOUNTER — Ambulatory Visit: Payer: Medicare Other | Admitting: Physical Therapy

## 2016-08-22 DIAGNOSIS — R296 Repeated falls: Secondary | ICD-10-CM

## 2016-08-22 DIAGNOSIS — M6281 Muscle weakness (generalized): Secondary | ICD-10-CM

## 2016-08-22 DIAGNOSIS — R262 Difficulty in walking, not elsewhere classified: Secondary | ICD-10-CM

## 2016-08-22 DIAGNOSIS — M25561 Pain in right knee: Secondary | ICD-10-CM

## 2016-08-22 NOTE — Therapy (Signed)
[facility] [address] [place], [place], [address] Phone: [phone]   Fax:  [phone]  Physical Therapy Treatment  Patient Details  Name: [name] MRN: [id number] Date of Birth: 01-30-1933 Referring Provider: Colin Benton DO  Encounter Date: 08/22/2016      PT End of Session - 08/22/16 0920    Visit Number 7   Number of Visits 10   Date for PT Re-Evaluation 09/26/16   Authorization Type Medicare G codes:  KX at visit 15   PT Start Time 0848   PT Stop Time 0930   PT Time Calculation (min) 42 min   Activity Tolerance Patient tolerated treatment well      Past Medical History:  Diagnosis Date  . Anemia   . Atherosclerotic PVD with intermittent claudication ([place])   . CAD (coronary artery disease)   . Diabetes mellitus    type 2 with peripheral neuropathy  . Diastolic CHF, chronic ([place])   . Diverticulosis   . DVT (deep venous thrombosis) ([place])   . Dyslipidemia   . Edema 08/19/2014  . GERD (gastroesophageal reflux disease)    hx hiatal hernia  . HTN (hypertension)   . IBS (irritable bowel syndrome)    Hx: of  . Osteoarthritis   . Overactive bladder    Hx: of stress incontinence  . Pneumonia 2002   history of  . Shingles   . Varicose veins of lower extremities with other complications 0000000    Past Surgical History:  Procedure Laterality Date  . BACK SURGERY    . CATARACT EXTRACTION Bilateral 08/10/2015,08/15/2015  . COLONOSCOPY     Hx: of  . CTS bilateral    . KNEE ARTHROSCOPY  1988, 1992   bilateral  . LAMINECTOMY  08/18/2013   DECOMPRESSIVE LAMINECTOMIES  . LEFT AND RIGHT HEART CATHETERIZATION WITH CORONARY ANGIOGRAM N/A 09/03/2014   Procedure: LEFT AND RIGHT HEART CATHETERIZATION WITH CORONARY ANGIOGRAM;  Surgeon: [name], MD;  Location: [facility] CATH LAB;  Service: Cardiovascular;  Laterality: N/A;  . LUMBAR LAMINECTOMY/DECOMPRESSION MICRODISCECTOMY N/A 08/18/2013   Procedure:  Lumbar two-three, lumbar three-four, lumbar four-five decompressive laminectomies;  Surgeon: [name], MD;  Location: [place] NEURO ORS;  Service: Neurosurgery;  Laterality: N/A;  . lumbar left arm  1961  . TONSILLECTOMY    . TOTAL KNEE ARTHROPLASTY  2005, 2007  . wrist tendon surgery      There were no vitals filed for this visit.      Subjective Assessment - 08/22/16 0853    Subjective Patient reports increased neck and shoulder pain which he attributes using the arms on the Nu-Step.  Reports standing produces low back pain.     Currently in Pain? Yes   Pain Score 3    Pain Location Neck   Aggravating Factors  using arms                         [facility] Adult PT Treatment/Exercise - 08/22/16 0001      Lumbar Exercises: Stretches   Active Hamstring Stretch 3 reps;30 seconds   Active Hamstring Stretch Limitations with strap supine     Lumbar Exercises: Supine   Clam 15 reps   Clam Limitations red band with abdominal brace, alternating legs   Bridge 10 reps   Bridge Limitations with LEs over ball closer to ankles for inc difficulty   Isometric Hip Flexion 10 reps     Knee/Hip Exercises:  Aerobic   Nustep L1 10 min  Legs only     Knee/Hip Exercises: Standing   Hip ADduction AROM;Right;Left;1 set;10 reps   Hip Extension AROM;Right;Left;1 set;10 reps   Other Standing Knee Exercises weight shifting 4 ways with UE support and no support   Other Standing Knee Exercises step taps 4 inches 10x     Knee/Hip Exercises: Seated   Long Arc Quad Strengthening;Right;Left;1 set;10 reps   Long Arc Quad Limitations red band   Other Seated Knee/Hip Exercises hip flexion ankle DF red band  10x red band   Sit to [employer] 10 reps  from mat plus wedge                  PT Short Term Goals - 08/22/16 0927      PT SHORT TERM GOAL #1   Title The patient will demonstrate compliance with basic HEP to improve LE muscle lengths and initial LE strengthening   08/29/16    Status Achieved     PT SHORT TERM GOAL #2   Title The patient will have improved Timed up and Go test to 17 sec indicating improved gait speed for short distance ambulation   Time 4   Period Weeks   Status On-going     PT SHORT TERM GOAL #3   Title BERG balance test score improved to 34/56 indicating improved static and dynamic balance for decreased fall risk   Time 4   Period Weeks   Status On-going     PT SHORT TERM GOAL #4   Title HS length improved to 55 degrees needed for improved standing and walking tolerance   Status Achieved           PT Long Term Goals - 08/22/16 [id number]      PT LONG TERM GOAL #1   Title The patient will be independent in a progressive HEP and appropriate Silver Sneakers program for further progress and improvements in mobility   09/26/16   Time 8   Period Weeks   Status On-going     PT LONG TERM GOAL #2   Title The patient will report a 50% improvement in pain and function with ADLS   Time 8   Period Weeks     PT LONG TERM GOAL #3   Title The patient will have bilateral LE strength to grossly 4/5 to be able to stand 5 minutes for shaving, showering and personal care   Time 8   Period Weeks   Status On-going     PT LONG TERM GOAL #4   Title Patient will be able to ambulate 240 feet with RW needed for short distance community ambulation    Time 8   Period Weeks   Status On-going     PT LONG TERM GOAL #5   Title BERG balance test score improved to 36/56 indicating decreased fall risk   Time 8   Period Weeks   Status On-going     PT LONG TERM GOAL #6   Title Timed up and Go test improved to 14 sec indicating improved gait speed for community ambulation   Time 8   Period Weeks   Status On-going               Plan - 08/22/16 [id number]    Clinical Impression Statement Modified exercises to decrease UE use secondary to complaints of increased shoulder and neck pain.  He reports LE muscle fatigue with exercises in sitting and standing  exercises.  Frequent sitting breaks needed between standing ex.  Patient declines e-stim/heat today.  Therapist closely monitoring response with all and modifying as needed.     Clinical Impairments Affecting Rehab Potential DM with LE neuropathy, CAD, CHF, vertigo, Bilateral TKR 10+ years ago   PT Next Visit Plan Nu-Step without arms  LE stretching;  LE strengthening in sitting, supine and standing;  core activation low level ex;  e-stim/heat for neck pain as needed;  TUG and BERG next visit      Patient will benefit from skilled therapeutic intervention in order to improve the following deficits and impairments:     Visit Diagnosis: Muscle weakness (generalized)  Repeated falls  Difficulty in walking, not elsewhere classified  Pain in right knee     Problem List Patient Active Problem List   Diagnosis Date Noted  . OAB (overactive bladder) 03/26/2015  . Chronic diastolic congestive heart failure ([place]) 11/23/2014  . CAD (coronary artery disease) 10/14/2014  . Diabetic peripheral neuropathy associated with type 2 diabetes mellitus ([place]) 05/30/2010  . Essential hypertension 08/20/2007  . Uncontrolled type 2 diabetes mellitus with peripheral circulatory disorder ([place]) 06/13/2007  . Hyperlipemia 06/13/2007  . GERD 06/13/2007   Ruben Im, PT 08/22/16 8:18 PM Phone: [phone] Fax: [phone]  Alvera Singh 08/22/2016, 8:18 PM  [facility] [address] [place], [place], [address] Phone: [phone]   Fax:  [phone]  Name: NATANAEL BERRETTA MRN: [id number] Date of Birth: Dec 19, 1932

## 2016-08-29 ENCOUNTER — Ambulatory Visit: Payer: Medicare Other | Admitting: Physical Therapy

## 2016-08-29 DIAGNOSIS — M6281 Muscle weakness (generalized): Secondary | ICD-10-CM | POA: Diagnosis not present

## 2016-08-29 DIAGNOSIS — M25561 Pain in right knee: Secondary | ICD-10-CM

## 2016-08-29 DIAGNOSIS — R262 Difficulty in walking, not elsewhere classified: Secondary | ICD-10-CM

## 2016-08-29 DIAGNOSIS — R296 Repeated falls: Secondary | ICD-10-CM

## 2016-08-29 NOTE — Therapy (Signed)
[facility] [address], [place], [place], [address] Phone: [phone]   Fax:  [phone]  Physical Therapy Treatment  Patient Details  Name: [name] MRN: [id number] Date of Birth: 02-Mar-1933 Referring Provider: Colin Benton DO  Encounter Date: 08/29/2016      PT End of Session - 08/29/16 0909    Visit Number 8   Number of Visits 10   Date for PT Re-Evaluation 09/26/16   Authorization Type Medicare G codes:  KX at visit 15   PT Start Time 0842   PT Stop Time 0935   PT Time Calculation (min) 53 min   Activity Tolerance Patient tolerated treatment well      Past Medical History:  Diagnosis Date  . Anemia   . Atherosclerotic PVD with intermittent claudication ([place])   . CAD (coronary artery disease)   . Diabetes mellitus    type 2 with peripheral neuropathy  . Diastolic CHF, chronic ([place])   . Diverticulosis   . DVT (deep venous thrombosis) ([place])   . Dyslipidemia   . Edema 08/19/2014  . GERD (gastroesophageal reflux disease)    hx hiatal hernia  . HTN (hypertension)   . IBS (irritable bowel syndrome)    Hx: of  . Osteoarthritis   . Overactive bladder    Hx: of stress incontinence  . Pneumonia 2002   history of  . Shingles   . Varicose veins of lower extremities with other complications 0000000    Past Surgical History:  Procedure Laterality Date  . BACK SURGERY    . CATARACT EXTRACTION Bilateral 08/10/2015,08/15/2015  . COLONOSCOPY     Hx: of  . CTS bilateral    . KNEE ARTHROSCOPY  1988, 1992   bilateral  . LAMINECTOMY  08/18/2013   DECOMPRESSIVE LAMINECTOMIES  . LEFT AND RIGHT HEART CATHETERIZATION WITH CORONARY ANGIOGRAM N/A 09/03/2014   Procedure: LEFT AND RIGHT HEART CATHETERIZATION WITH CORONARY ANGIOGRAM;  Surgeon: [name], MD;  [facility] CATH LAB;  Service: Cardiovascular;  Laterality: N/A;  . LUMBAR LAMINECTOMY/DECOMPRESSION MICRODISCECTOMY N/A 08/18/2013   Procedure:  Lumbar two-three, lumbar three-four, lumbar four-five decompressive laminectomies;  Surgeon: [name], MD;  Location: [place] NEURO ORS;  Service: Neurosurgery;  Laterality: N/A;  . lumbar left arm  1961  . TONSILLECTOMY    . TOTAL KNEE ARTHROPLASTY  2005, 2007  . wrist tendon surgery      There were no vitals filed for this visit.      Subjective Assessment - 08/29/16 0844    Subjective Reports decreased shoulder/chest pain after last visit since altered Nu-Step to use legs only.  States LE weakness persists.   Going to see Dr. Nelva Bush on Saturday.  Back pain with standing.     Currently in Pain? Yes   Pain Score 5    Pain Location Neck   Pain Orientation Right;Left   Pain Type Chronic pain   Aggravating Factors  standing;  turning head   Pain Relieving Factors sitting;  not moving head                         [facility] Adult PT Treatment/Exercise - 08/29/16 0001      Lumbar Exercises: Supine   Clam 15 reps   Clam Limitations red band with abdominal brace, alternating legs     Knee/Hip Exercises: Stretches   Active Hamstring Stretch Right;Left;3 reps;30 seconds   Active Hamstring Stretch Limitations in supine  with strap     Knee/Hip Exercises: Aerobic   Nustep L1 10 min  Legs only     Knee/Hip Exercises: Standing   Hip ADduction AROM;Right;Left;1 set;10 reps   Hip Extension AROM;Right;Left;1 set;10 reps   Lateral Step Up Right;Left;1 set;5 reps   Other Standing Knee Exercises red band hip extension right and left 10x each   Other Standing Knee Exercises step taps 4 inches 10x     Knee/Hip Exercises: Seated   Long Arc Quad Strengthening;Right;Left;1 set;10 reps   Long Arc Quad Limitations red band   Other Seated Knee/Hip Exercises hip flexion ankle DF red band  10x red band   Sit to [employer] 10 reps  from mat plus wedge     Moist Heat Therapy   Number Minutes Moist Heat 15 Minutes   Moist Heat Location Cervical     Electrical Stimulation   Electrical  Stimulation Location Bil upper traps   Electrical Stimulation Action IFC    Electrical Stimulation Parameters 7 ma 15 min   Electrical Stimulation Goals Pain                  PT Short Term Goals - 08/29/16 [id number]      PT SHORT TERM GOAL #1   Title The patient will demonstrate compliance with basic HEP to improve LE muscle lengths and initial LE strengthening   08/29/16   Status Achieved     PT SHORT TERM GOAL #2   Title The patient will have improved Timed up and Go test to 17 sec indicating improved gait speed for short distance ambulation   Time 4   Period Weeks   Status On-going     PT SHORT TERM GOAL #3   Title BERG balance test score improved to 34/56 indicating improved static and dynamic balance for decreased fall risk   Period Weeks   Status On-going     PT SHORT TERM GOAL #4   Title HS length improved to 55 degrees needed for improved standing and walking tolerance   Status Achieved           PT Long Term Goals - 08/29/16 [id number]      PT LONG TERM GOAL #1   Title The patient will be independent in a progressive HEP and appropriate Silver Sneakers program for further progress and improvements in mobility   09/26/16   Time 8   Period Weeks   Status On-going     PT LONG TERM GOAL #2   Title The patient will report a 50% improvement in pain and function with ADLS   Time 8   Period Weeks   Status On-going     PT LONG TERM GOAL #3   Title The patient will have bilateral LE strength to grossly 4/5 to be able to stand 5 minutes for shaving, showering and personal care   Time 8   Period Weeks   Status On-going     PT LONG TERM GOAL #4   Title Patient will be able to ambulate 240 feet with RW needed for short distance community ambulation    Time 8   Period Weeks   Status On-going     PT LONG TERM GOAL #5   Title BERG balance test score improved to 36/56 indicating decreased fall risk   Time 8   Period Weeks   Status On-going     PT LONG TERM GOAL  #6   Title Timed up and Go test improved to  14 sec indicating improved gait speed for community ambulation   Time 8   Period Weeks   Status On-going               Plan - 08/29/16 0910    Clinical Impression Statement Patient continues to have difficulty standing for short periods of time secondary to knee and back pain.  Intermittent HS cramps as well today.  He reports increased neck discomfort today and requests e-stim/heat for pain relief.  Continues to need seated rest breaks between ex but after resting he is able to participate in a progression of strengthening.     PT Next Visit Plan Nu-Step without arms  LE stretching;  LE strengthening in sitting, supine and standing;  core activation low level ex;   TUG and BERG next visit      Patient will benefit from skilled therapeutic intervention in order to improve the following deficits and impairments:     Visit Diagnosis: Muscle weakness (generalized)  Repeated falls  Difficulty in walking, not elsewhere classified  Pain in right knee     Problem List Patient Active Problem List   Diagnosis Date Noted  . OAB (overactive bladder) 03/26/2015  . Chronic diastolic congestive heart failure ([place]) 11/23/2014  . CAD (coronary artery disease) 10/14/2014  . Diabetic peripheral neuropathy associated with type 2 diabetes mellitus ([place]) 05/30/2010  . Essential hypertension 08/20/2007  . Uncontrolled type 2 diabetes mellitus with peripheral circulatory disorder ([place]) 06/13/2007  . Hyperlipemia 06/13/2007  . GERD 06/13/2007   Ruben Im, PT 08/29/16 9:24 AM Phone: [phone] Fax: [phone]  Alvera Singh 08/29/2016, 9:24 AM  [facility] [address] [place], [place], [address] Phone: [phone]   Fax:  [phone]  Name: [name] MRN: [id number] Date of Birth: September 04, 1933

## 2016-08-31 ENCOUNTER — Ambulatory Visit: Payer: Medicare Other | Admitting: Physical Therapy

## 2016-08-31 DIAGNOSIS — M25561 Pain in right knee: Secondary | ICD-10-CM

## 2016-08-31 DIAGNOSIS — M6281 Muscle weakness (generalized): Secondary | ICD-10-CM

## 2016-08-31 DIAGNOSIS — R296 Repeated falls: Secondary | ICD-10-CM

## 2016-08-31 DIAGNOSIS — R262 Difficulty in walking, not elsewhere classified: Secondary | ICD-10-CM

## 2016-08-31 NOTE — Therapy (Signed)
[facility] [address] [place], [place], [address] Phone: [phone]   Fax:  [phone]  Physical Therapy Treatment  Patient Details  Name: [name] MRN: [id number] Date of Birth: Apr 01, 1933 Referring Provider: Colin Benton DO  Encounter Date: 08/31/2016      PT End of Session - 08/31/16 1027    Visit Number 9   Number of Visits 10   Date for PT Re-Evaluation 09/26/16   Authorization Type Medicare G codes:  KX at visit 15   PT Start Time 1010   PT Stop Time 1053   PT Time Calculation (min) 43 min   Activity Tolerance Patient limited by fatigue      Past Medical History:  Diagnosis Date  . Anemia   . Atherosclerotic PVD with intermittent claudication ([place])   . CAD (coronary artery disease)   . Diabetes mellitus    type 2 with peripheral neuropathy  . Diastolic CHF, chronic ([place])   . Diverticulosis   . DVT (deep venous thrombosis) ([place])   . Dyslipidemia   . Edema 08/19/2014  . GERD (gastroesophageal reflux disease)    hx hiatal hernia  . HTN (hypertension)   . IBS (irritable bowel syndrome)    Hx: of  . Osteoarthritis   . Overactive bladder    Hx: of stress incontinence  . Pneumonia 2002   history of  . Shingles   . Varicose veins of lower extremities with other complications 03/29/3789    Past Surgical History:  Procedure Laterality Date  . BACK SURGERY    . CATARACT EXTRACTION Bilateral 08/10/2015,08/15/2015  . COLONOSCOPY     Hx: of  . CTS bilateral    . KNEE ARTHROSCOPY  1988, 1992   bilateral  . LAMINECTOMY  08/18/2013   DECOMPRESSIVE LAMINECTOMIES  . LEFT AND RIGHT HEART CATHETERIZATION WITH CORONARY ANGIOGRAM N/A 09/03/2014   Procedure: LEFT AND RIGHT HEART CATHETERIZATION WITH CORONARY ANGIOGRAM;  Surgeon: [name], MD;  Location: [facility] C[facility] LAB;  Service: Cardiovascular;  Laterality: N/A;  . LUMBAR LAMINECTOMY/DECOMPRESSION MICRODISCECTOMY N/A 08/18/2013   Procedure: Lumbar  two-three, lumbar three-four, lumbar four-five decompressive laminectomies;  Surgeon: [name], MD;  Location: [place] NEURO ORS;  Service: Neurosurgery;  Laterality: N/A;  . lumbar left arm  1961  . TONSILLECTOMY    . TOTAL KNEE ARTHROPLASTY  2005, 2007  . wrist tendon surgery      There were no vitals filed for this visit.      Subjective Assessment - 08/31/16 1018    Subjective Patient complains of increased shoulder pain after last visit and the day after which he attributes to using the strap to stretch his HS.  Patient complains of generally not feeling well today.     Currently in Pain? Yes   Pain Score 6    Pain Location Back   Multiple Pain Sites Yes   Pain Score 6   Pain Location Shoulder   Pain Orientation Right;Left   Pain Type Chronic pain            [facility] PT Assessment - 08/31/16 0001      Timed Up and Go Test   Manual TUG (seconds) 17.6   TUG Comments with RW                     [facility] Adult PT Treatment/Exercise - 08/31/16 0001      Knee/Hip Exercises: Stretches   Active Hamstring Stretch Right;Left;3 reps;30  seconds   Active Hamstring Stretch Limitations seated with foot on stool     Knee/Hip Exercises: Aerobic   Nustep L1 10 min  Legs only     Knee/Hip Exercises: Seated   Long Arc Quad Strengthening;Right;Left;1 set;10 reps   Long Arc Quad Limitations red band   Clamshell with TheraBand Red  10x   Other Seated Knee/Hip Exercises hip flexion ankle DF red band  10x red band     Moist Heat Therapy   Number Minutes Moist Heat 15 Minutes   Moist Heat Location Cervical     Electrical Stimulation   Electrical Stimulation Location neck and upper traps   Electrical Stimulation Action IFC   Electrical Stimulation Parameters 7 ma 15 min   Electrical Stimulation Goals Pain                  PT Short Term Goals - 08/31/16 1041      PT SHORT TERM GOAL #1   Title The patient will demonstrate compliance with basic HEP to  improve LE muscle lengths and initial LE strengthening   08/29/16   Status Achieved     PT SHORT TERM GOAL #2   Title The patient will have improved Timed up and Go test to 17 sec indicating improved gait speed for short distance ambulation   Status Partially Met     PT SHORT TERM GOAL #3   Title BERG balance test score improved to 34/56 indicating improved static and dynamic balance for decreased fall risk   Time 4   Period Weeks   Status On-going     PT SHORT TERM GOAL #4   Title HS length improved to 55 degrees needed for improved standing and walking tolerance   Status Achieved           PT Long Term Goals - 08/31/16 1041      PT LONG TERM GOAL #1   Title The patient will be independent in a progressive HEP and appropriate Silver Sneakers program for further progress and improvements in mobility   09/26/16   Time 8   Period Weeks   Status On-going     PT LONG TERM GOAL #2   Title The patient will report a 50% improvement in pain and function with ADLS   Time 8   Period Weeks   Status On-going     PT LONG TERM GOAL #3   Title The patient will have bilateral LE strength to grossly 4/5 to be able to stand 5 minutes for shaving, showering and personal care   Time 8   Period Weeks   Status On-going     PT LONG TERM GOAL #4   Title Patient will be able to ambulate 240 feet with RW needed for short distance community ambulation    Time 8   Period Weeks   Status On-going     PT LONG TERM GOAL #5   Title BERG balance test score improved to 36/56 indicating decreased fall risk   Time 8   Period Weeks   Status On-going     PT LONG TERM GOAL #6   Title Timed up and Go test improved to 14 sec indicating improved gait speed for community ambulation   Time 8   Period Weeks   Status On-going               Plan - 08/31/16 1028    Clinical Impression Statement The patient report increased shoulder, back and neck  pain today and generally not feeling better.  His  shoulders are quite sensitive and painful and therefore exercises need to be modified to avoid any UE use.  Patient also prone to LE cramping.  Will defer recheck of BERG balance test until next visit secondary to patient generally not feeling well today.   He did request modalities today for pain control.   He did improve Timed up and Go test by 1.5 sec since initial.  Therapist closely monitoring response and supervision in standing for safety.     PT Next Visit Plan Nu-Step without arms  LE stretching no strap secondary to UE pain;    LE strengthening in sitting, supine and standing;  core activation low level ex;  BERG next visit';  G code       Patient will benefit from skilled therapeutic intervention in order to improve the following deficits and impairments:     Visit Diagnosis: Muscle weakness (generalized)  Repeated falls  Difficulty in walking, not elsewhere classified  Pain in right knee     Problem List Patient Active Problem List   Diagnosis Date Noted  . OAB (overactive bladder) 03/26/2015  . Chronic diastolic congestive heart failure ([place]) 11/23/2014  . CAD (coronary artery disease) 10/14/2014  . Diabetic peripheral neuropathy associated with type 2 diabetes mellitus ([place]) 05/30/2010  . Essential hypertension 08/20/2007  . Uncontrolled type 2 diabetes mellitus with peripheral circulatory disorder ([place]) 06/13/2007  . Hyperlipemia 06/13/2007  . GERD 06/13/2007   Ruben Im, PT 08/31/16 10:52 AM Phone: [phone] Fax: [phone]  Alvera Singh 08/31/2016, 10:51 AM  [facility] [address], [place], [place], [address] Phone: [phone]   Fax:  [phone]  Name: [name] MRN: [id number] Date of Birth: 09-29-1933

## 2016-09-01 ENCOUNTER — Ambulatory Visit (INDEPENDENT_AMBULATORY_CARE_PROVIDER_SITE_OTHER): Payer: Medicare Other | Admitting: *Deleted

## 2016-09-01 DIAGNOSIS — Z23 Encounter for immunization: Secondary | ICD-10-CM

## 2016-09-05 ENCOUNTER — Ambulatory Visit: Payer: Medicare Other | Admitting: Physical Therapy

## 2016-09-05 DIAGNOSIS — M6281 Muscle weakness (generalized): Secondary | ICD-10-CM | POA: Diagnosis not present

## 2016-09-05 DIAGNOSIS — M25561 Pain in right knee: Secondary | ICD-10-CM

## 2016-09-05 DIAGNOSIS — R262 Difficulty in walking, not elsewhere classified: Secondary | ICD-10-CM

## 2016-09-05 DIAGNOSIS — R296 Repeated falls: Secondary | ICD-10-CM

## 2016-09-05 NOTE — Therapy (Signed)
[facility] [address], [place], [place], [address] Phone: [phone]   Fax:  [phone]  Physical Therapy Treatment  Patient Details  Name: [name] MRN: [id number] Date of Birth: [birth year] Referring Provider: Colin Benton DO  Encounter Date: 09/05/2016      PT End of Session - 09/05/16 0908    Visit Number 10   Number of Visits 20   Date for PT Re-Evaluation 09/26/16   Authorization Type Medicare G codes:  KX at visit 15   PT Start Time 0845   PT Stop Time 0930   PT Time Calculation (min) 45 min   Activity Tolerance Patient tolerated treatment well      Past Medical History:  Diagnosis Date  . Anemia   . Atherosclerotic PVD with intermittent claudication ([place])   . CAD (coronary artery disease)   . Diabetes mellitus    type 2 with peripheral neuropathy  . Diastolic CHF, chronic ([place])   . Diverticulosis   . DVT (deep venous thrombosis) ([place])   . Dyslipidemia   . Edema 08/19/2014  . GERD (gastroesophageal reflux disease)    hx hiatal hernia  . HTN (hypertension)   . IBS (irritable bowel syndrome)    Hx: of  . Osteoarthritis   . Overactive bladder    Hx: of stress incontinence  . Pneumonia 2002   history of  . Shingles   . Varicose veins of lower extremities with other complications 2/40/9735    Past Surgical History:  Procedure Laterality Date  . BACK SURGERY    . CATARACT EXTRACTION Bilateral 08/10/2015,08/15/2015  . COLONOSCOPY     Hx: of  . CTS bilateral    . KNEE ARTHROSCOPY  1988, 1992   bilateral  . LAMINECTOMY  08/18/2013   DECOMPRESSIVE LAMINECTOMIES  . LEFT AND RIGHT HEART CATHETERIZATION WITH CORONARY ANGIOGRAM N/A 09/03/2014   Procedure: LEFT AND RIGHT HEART CATHETERIZATION WITH CORONARY ANGIOGRAM;  Surgeon: [name], MD;  Location: [facility] CATH LAB;  Service: Cardiovascular;  Laterality: N/A;  . LUMBAR LAMINECTOMY/DECOMPRESSION MICRODISCECTOMY N/A 08/18/2013   Procedure:  Lumbar two-three, lumbar three-four, lumbar four-five decompressive laminectomies;  Surgeon: [name], MD;  Location: [place] NEURO ORS;  Service: Neurosurgery;  Laterality: N/A;  . lumbar left arm  1961  . TONSILLECTOMY    . TOTAL KNEE ARTHROPLASTY  2005, 2007  . wrist tendon surgery      There were no vitals filed for this visit.      Subjective Assessment - 09/05/16 0847    Subjective I feel worse.  My left shoulder hurts more than my right shoulder.  Went to see Dr. Nelva Bush yesterday and he is referring for an MRI and referring to a ortho surgeon.  My shoulder pain is waking me up at night.   Had a vertigo spell on Saturday.  Better today but still a little off.     Currently in Pain? Yes   Pain Score 8    Pain Location Shoulder   Pain Orientation Left   Pain Type Chronic pain   Pain Score 4   Pain Location Neck   Pain Onset More than a month ago   Pain Frequency Constant            [facility] PT Assessment - 09/05/16 0001      Berg Balance Test   Sit to Stand Able to stand  independently using hands   Standing Unsupported Able to stand 2 minutes with  supervision   Sitting with Back Unsupported but Feet Supported on Floor or Stool Able to sit 2 minutes under supervision   Stand to Sit Controls descent by using hands   Transfers Able to transfer safely, definite need of hands   Standing Unsupported with Eyes Closed Able to stand 10 seconds with supervision   Standing Ubsupported with Feet Together Able to place feet together independently but unable to hold for 30 seconds   From Standing, Reach Forward with Outstretched Arm Can reach forward >5 cm safely (2")   From Standing Position, Pick up Object from [place] to pick up shoe, needs supervision   From Standing Position, Turn to Look Behind Over each Shoulder Looks behind one side only/other side shows less weight shift   Turn 360 Degrees Able to turn 360 degrees safely but slowly   Standing Unsupported, Alternately  Place Feet on Step/Stool Needs assistance to keep from falling or unable to try   Standing Unsupported, One Foot in [employer] balance while stepping or standing   Standing on One Leg Unable to try or needs assist to prevent fall   Total Score 30                     [facility] Adult PT Treatment/Exercise - 09/05/16 0001      Knee/Hip Exercises: Stretches   Active Hamstring Stretch Right;Left;3 reps;30 seconds   Active Hamstring Stretch Limitations seated with foot on stool   Other Knee/Hip Stretches red band PF 15x right left     Knee/Hip Exercises: Aerobic   Nustep L1 10 min  Legs only     Knee/Hip Exercises: Seated   Long Arc Quad Strengthening;Right;Left;1 set;10 reps   Long Arc Quad Limitations red band   Clamshell with TheraBand Red  10x3 double and single legs   Other Seated Knee/Hip Exercises hip flexion ankle DF red band  10x red band                  PT Short Term Goals - 09/05/16 2119      PT SHORT TERM GOAL #1   Title The patient will demonstrate compliance with basic HEP to improve LE muscle lengths and initial LE strengthening   08/29/16   Status Achieved     PT SHORT TERM GOAL #2   Title The patient will have improved Timed up and Go test to 17 sec indicating improved gait speed for short distance ambulation   Time 4   Period Weeks   Status Partially Met     PT SHORT TERM GOAL #3   Title BERG balance test score improved to 34/56 indicating improved static and dynamic balance for decreased fall risk   Time 4   Period Weeks   Status On-going     PT SHORT TERM GOAL #4   Title HS length improved to 55 degrees needed for improved standing and walking tolerance   Status Achieved           PT Long Term Goals - 09/05/16 2120      PT LONG TERM GOAL #1   Title The patient will be independent in a progressive HEP and appropriate Silver Sneakers program for further progress and improvements in mobility   09/26/16   Time 8   Period Weeks    Status On-going     PT LONG TERM GOAL #2   Title The patient will report a 50% improvement in pain and function with ADLS  Time 8   Period Weeks   Status On-going     PT LONG TERM GOAL #3   Title The patient will have bilateral LE strength to grossly 4/5 to be able to stand 5 minutes for shaving, showering and personal care   Time 8   Period Weeks   Status On-going     PT LONG TERM GOAL #4   Title Patient will be able to ambulate 240 feet with RW needed for short distance community ambulation    Period Weeks   Status On-going     PT LONG TERM GOAL #5   Title BERG balance test score improved to 36/56 indicating decreased fall risk   Time 8   Period Weeks   Status On-going     PT LONG TERM GOAL #6   Title Timed up and Go test improved to 14 sec indicating improved gait speed for community ambulation   Time 8   Period Weeks   Status On-going               Plan - 09/28/2016 0924    Clinical Impression Statement The patient is generally not feeling well today with reports of a bout of vertigo over the weekend and a worsening of left shoulder pain (he has been referred for an MRI and follow up wiht ortho surgeon).  Decreased exercise intensity today.  His BERG improved to 30/56 but anticipate he would have improved  much more if not for his bout of vertigo over the weekend.  Therapist closely monitoring for safety with balance challenges and modifying for shoulder pain and dizziness.     PT Next Visit Plan Nu-Step without arms  LE stretching no strap secondary to UE pain;    LE strengthening in sitting, supine and standing;  core activation low level ex;  get baseline 6 min walk test      Patient will benefit from skilled therapeutic intervention in order to improve the following deficits and impairments:     Visit Diagnosis: Muscle weakness (generalized)  Repeated falls  Difficulty in walking, not elsewhere classified  Pain in right knee       G-Codes - 2016-09-28  14-Feb-2121    Functional Assessment Tool Used BERG; Timed up and Go; clinical judgement   Functional Limitation Mobility: Walking and moving around   Mobility: Walking and Moving Around Current Status [phone]) At least 60 percent but less than 80 percent impaired, limited or restricted   Mobility: Walking and Moving Around Goal Status [phone]) At least 40 percent but less than 60 percent impaired, limited or restricted      Problem List Patient Active Problem List   Diagnosis Date Noted  . OAB (overactive bladder) 03/26/2015  . Chronic diastolic congestive heart failure ([place]) 11/23/2014  . CAD (coronary artery disease) 10/14/2014  . Diabetic peripheral neuropathy associated with type 2 diabetes mellitus ([place]) 05/30/2010  . Essential hypertension 08/20/2007  . Uncontrolled type 2 diabetes mellitus with peripheral circulatory disorder ([place]) 06/13/2007  . Hyperlipemia 06/13/2007  . GERD 06/13/2007   Ruben Im, PT 28-Sep-2016 9:23 PM Phone: [phone] Fax: [phone]  Alvera Singh 09-28-2016, Sandi Mariscal PM  [facility] [address], [place], [place], [address] Phone: [phone]   Fax:  [phone]  Name: [name] MRN: [id number] Date of Birth: 10/09/1933

## 2016-09-05 NOTE — Therapy (Signed)
[facility] [address], [place], [place], [address] Phone: [phone]   Fax:  [phone]  Physical Therapy Treatment  Patient Details  Name: [name] MRN: [id number] Date of Birth: [birth year] Referring Provider: Colin Benton DO  Encounter Date: 09/05/2016      PT End of Session - 09/05/16 0908    Visit Number 10   Number of Visits 20   Date for PT Re-Evaluation 09/26/16   Authorization Type Medicare G codes:  KX at visit 15   PT Start Time 0845   PT Stop Time 0930   PT Time Calculation (min) 45 min   Activity Tolerance Patient tolerated treatment well      Past Medical History:  Diagnosis Date  . Anemia   . Atherosclerotic PVD with intermittent claudication ([place])   . CAD (coronary artery disease)   . Diabetes mellitus    type 2 with peripheral neuropathy  . Diastolic CHF, chronic ([place])   . Diverticulosis   . DVT (deep venous thrombosis) ([place])   . Dyslipidemia   . Edema 08/19/2014  . GERD (gastroesophageal reflux disease)    hx hiatal hernia  . HTN (hypertension)   . IBS (irritable bowel syndrome)    Hx: of  . Osteoarthritis   . Overactive bladder    Hx: of stress incontinence  . Pneumonia 2002   history of  . Shingles   . Varicose veins of lower extremities with other complications 2/40/9735    Past Surgical History:  Procedure Laterality Date  . BACK SURGERY    . CATARACT EXTRACTION Bilateral 08/10/2015,08/15/2015  . COLONOSCOPY     Hx: of  . CTS bilateral    . KNEE ARTHROSCOPY  1988, 1992   bilateral  . LAMINECTOMY  08/18/2013   DECOMPRESSIVE LAMINECTOMIES  . LEFT AND RIGHT HEART CATHETERIZATION WITH CORONARY ANGIOGRAM N/A 09/03/2014   Procedure: LEFT AND RIGHT HEART CATHETERIZATION WITH CORONARY ANGIOGRAM;  Surgeon: [name], MD;  Location: [facility] CATH LAB;  Service: Cardiovascular;  Laterality: N/A;  . LUMBAR LAMINECTOMY/DECOMPRESSION MICRODISCECTOMY N/A 08/18/2013   Procedure:  Lumbar two-three, lumbar three-four, lumbar four-five decompressive laminectomies;  Surgeon: [name], MD;  Location: [place] NEURO ORS;  Service: Neurosurgery;  Laterality: N/A;  . lumbar left arm  1961  . TONSILLECTOMY    . TOTAL KNEE ARTHROPLASTY  2005, 2007  . wrist tendon surgery      There were no vitals filed for this visit.      Subjective Assessment - 09/05/16 0847    Subjective I feel worse.  My left shoulder hurts more than my right shoulder.  Went to see Dr. Nelva Bush yesterday and he is referring for an MRI and referring to a ortho surgeon.  My shoulder pain is waking me up at night.   Had a vertigo spell on Saturday.  Better today but still a little off.     Currently in Pain? Yes   Pain Score 8    Pain Location Shoulder   Pain Orientation Left   Pain Type Chronic pain   Pain Score 4   Pain Location Neck   Pain Onset More than a month ago   Pain Frequency Constant            [facility] PT Assessment - 09/05/16 0001      Berg Balance Test   Sit to Stand Able to stand  independently using hands   Standing Unsupported Able to stand 2 minutes with  supervision   Sitting with Back Unsupported but Feet Supported on Floor or Stool Able to sit 2 minutes under supervision   Stand to Sit Controls descent by using hands   Transfers Able to transfer safely, definite need of hands   Standing Unsupported with Eyes Closed Able to stand 10 seconds with supervision   Standing Ubsupported with Feet Together Able to place feet together independently but unable to hold for 30 seconds   From Standing, Reach Forward with Outstretched Arm Can reach forward >5 cm safely (2")   From Standing Position, Pick up Object from [place] to pick up shoe, needs supervision   From Standing Position, Turn to Look Behind Over each Shoulder Looks behind one side only/other side shows less weight shift   Turn 360 Degrees Able to turn 360 degrees safely but slowly   Standing Unsupported, Alternately  Place Feet on Step/Stool Needs assistance to keep from falling or unable to try   Standing Unsupported, One Foot in [employer] balance while stepping or standing   Standing on One Leg Unable to try or needs assist to prevent fall   Total Score 30                     [facility] Adult PT Treatment/Exercise - 09/05/16 0001      Knee/Hip Exercises: Stretches   Active Hamstring Stretch Right;Left;3 reps;30 seconds   Active Hamstring Stretch Limitations seated with foot on stool   Other Knee/Hip Stretches red band PF 15x right left     Knee/Hip Exercises: Aerobic   Nustep L1 10 min  Legs only     Knee/Hip Exercises: Seated   Long Arc Quad Strengthening;Right;Left;1 set;10 reps   Long Arc Quad Limitations red band   Clamshell with TheraBand Red  10x3 double and single legs   Other Seated Knee/Hip Exercises hip flexion ankle DF red band  10x red band                  PT Short Term Goals - 09/05/16 2119      PT SHORT TERM GOAL #1   Title The patient will demonstrate compliance with basic HEP to improve LE muscle lengths and initial LE strengthening   08/29/16   Status Achieved     PT SHORT TERM GOAL #2   Title The patient will have improved Timed up and Go test to 17 sec indicating improved gait speed for short distance ambulation   Time 4   Period Weeks   Status Partially Met     PT SHORT TERM GOAL #3   Title BERG balance test score improved to 34/56 indicating improved static and dynamic balance for decreased fall risk   Time 4   Period Weeks   Status On-going     PT SHORT TERM GOAL #4   Title HS length improved to 55 degrees needed for improved standing and walking tolerance   Status Achieved           PT Long Term Goals - 09/05/16 2120      PT LONG TERM GOAL #1   Title The patient will be independent in a progressive HEP and appropriate Silver Sneakers program for further progress and improvements in mobility   09/26/16   Time 8   Period Weeks    Status On-going     PT LONG TERM GOAL #2   Title The patient will report a 50% improvement in pain and function with ADLS  Time 8   Period Weeks   Status On-going     PT LONG TERM GOAL #3   Title The patient will have bilateral LE strength to grossly 4/5 to be able to stand 5 minutes for shaving, showering and personal care   Time 8   Period Weeks   Status On-going     PT LONG TERM GOAL #4   Title Patient will be able to ambulate 240 feet with RW needed for short distance community ambulation    Period Weeks   Status On-going     PT LONG TERM GOAL #5   Title BERG balance test score improved to 36/56 indicating decreased fall risk   Time 8   Period Weeks   Status On-going     PT LONG TERM GOAL #6   Title Timed up and Go test improved to 14 sec indicating improved gait speed for community ambulation   Time 8   Period Weeks   Status On-going               Plan - 09/05/16 0924    Clinical Impression Statement The patient is generally not feeling well today with reports of a bout of vertigo over the weekend and a worsening of left shoulder pain (he has been referred for an MRI and follow up wiht ortho surgeon).  Decreased exercise intensity today.  His BERG improved to 30/56 but anticipate he would have improved  much more if not for his bout of vertigo over the weekend.  Therapist closely monitoring for safety with balance challenges and modifying for shoulder pain and dizziness.     PT Next Visit Plan Nu-Step without arms  LE stretching no strap secondary to UE pain;    LE strengthening in sitting, supine and standing;  core activation low level ex;  get baseline 6 min walk test      Patient will benefit from skilled therapeutic intervention in order to improve the following deficits and impairments:     Visit Diagnosis: Muscle weakness (generalized)  Repeated falls  Difficulty in walking, not elsewhere classified  Pain in right knee     Problem List Patient  Active Problem List   Diagnosis Date Noted  . OAB (overactive bladder) 03/26/2015  . Chronic diastolic congestive heart failure ([place]) 11/23/2014  . CAD (coronary artery disease) 10/14/2014  . Diabetic peripheral neuropathy associated with type 2 diabetes mellitus ([place]) 05/30/2010  . Essential hypertension 08/20/2007  . Uncontrolled type 2 diabetes mellitus with peripheral circulatory disorder ([place]) 06/13/2007  . Hyperlipemia 06/13/2007  . GERD 06/13/2007    Alvera Singh 09/05/2016, 9:21 PM  [facility] [address], [place], [place], [address] Phone: [phone]   Fax:  [phone]  Name: [name] MRN: [id number] Date of Birth: 01-Dec-1933

## 2016-09-07 ENCOUNTER — Encounter: Payer: Self-pay | Admitting: Physical Therapy

## 2016-09-07 ENCOUNTER — Ambulatory Visit: Payer: Medicare Other | Admitting: Physical Therapy

## 2016-09-07 DIAGNOSIS — M6281 Muscle weakness (generalized): Secondary | ICD-10-CM | POA: Diagnosis not present

## 2016-09-07 DIAGNOSIS — R296 Repeated falls: Secondary | ICD-10-CM

## 2016-09-07 DIAGNOSIS — R262 Difficulty in walking, not elsewhere classified: Secondary | ICD-10-CM

## 2016-09-07 DIAGNOSIS — M25561 Pain in right knee: Secondary | ICD-10-CM

## 2016-09-07 NOTE — Therapy (Addendum)
[facility] [address] [place], [place], [address] Phone: [phone]   Fax:  [phone]  Physical Therapy Treatment/Discharge Summary  Patient Details  Name: [name] MRN: [id number] Date of Birth: March 18, 1933 Referring Provider: Colin Benton DO  Encounter Date: 09/07/2016      PT End of Session - 09/07/16 0851    Visit Number 11   Number of Visits 20   Date for PT Re-Evaluation 09/26/16   Authorization Type Medicare G codes:  KX at visit 15   PT Start Time 0845   PT Stop Time 0937   PT Time Calculation (min) 52 min   Activity Tolerance Patient tolerated treatment well   Behavior During Therapy [facility] for tasks assessed/performed      Past Medical History:  Diagnosis Date  . Anemia   . Atherosclerotic PVD with intermittent claudication ([place])   . CAD (coronary artery disease)   . Diabetes mellitus    type 2 with peripheral neuropathy  . Diastolic CHF, chronic ([place])   . Diverticulosis   . DVT (deep venous thrombosis) ([place])   . Dyslipidemia   . Edema 08/19/2014  . GERD (gastroesophageal reflux disease)    hx hiatal hernia  . HTN (hypertension)   . IBS (irritable bowel syndrome)    Hx: of  . Osteoarthritis   . Overactive bladder    Hx: of stress incontinence  . Pneumonia 2002   history of  . Shingles   . Varicose veins of lower extremities with other complications 7/67/2094    Past Surgical History:  Procedure Laterality Date  . BACK SURGERY    . CATARACT EXTRACTION Bilateral 08/10/2015,08/15/2015  . COLONOSCOPY     Hx: of  . CTS bilateral    . KNEE ARTHROSCOPY  1988, 1992   bilateral  . LAMINECTOMY  08/18/2013   DECOMPRESSIVE LAMINECTOMIES  . LEFT AND RIGHT HEART CATHETERIZATION WITH CORONARY ANGIOGRAM N/A 09/03/2014   Procedure: LEFT AND RIGHT HEART CATHETERIZATION WITH CORONARY ANGIOGRAM;  Surgeon: [name], MD;  Location: [facility] CATH LAB;  Service: Cardiovascular;  Laterality: N/A;  .  LUMBAR LAMINECTOMY/DECOMPRESSION MICRODISCECTOMY N/A 08/18/2013   Procedure: Lumbar two-three, lumbar three-four, lumbar four-five decompressive laminectomies;  Surgeon: [name], MD;  Location: [place] NEURO ORS;  Service: Neurosurgery;  Laterality: N/A;  . lumbar left arm  1961  . TONSILLECTOMY    . TOTAL KNEE ARTHROPLASTY  2005, 2007  . wrist tendon surgery      There were no vitals filed for this visit.      Subjective Assessment - 09/07/16 0849    Subjective Pt reports feeling better today. Shoulder doing better today but gets irritated while putting presure through walker.    Patient is accompained by: Family member   Pertinent History Right TKR, DM, Vertigo, CAD, CHF;   Currently in Pain? Yes   Pain Score 4    Pain Location Shoulder   Pain Orientation Left   Pain Score 5   Pain Location Neck   Pain Orientation Right;Left            [facility] PT Assessment - 09/07/16 0001      6 minute walk test results    Aerobic Endurance Distance Walked 480  4.5 minutes   Endurance additional comments Pt unable to walk for full 6 minutes due to fatigue                     [facility] Adult PT  Treatment/Exercise - 09/07/16 0001      Knee/Hip Exercises: Stretches   Active Hamstring Stretch Right;Left;3 reps;30 seconds   Active Hamstring Stretch Limitations seated     Knee/Hip Exercises: Aerobic   Nustep L1 10 min  Legs only     Knee/Hip Exercises: Standing   Hip Extension Stengthening;Both;2 sets;10 reps     Knee/Hip Exercises: Seated   Long Arc Quad Strengthening;Right;Left;1 set;10 reps   Long Arc Quad Weight 3 lbs.   Clamshell with TheraBand Red  10x3 double and single legs   Other Seated Knee/Hip Exercises hip flexion ankle DF red band  10x red band     Moist Heat Therapy   Number Minutes Moist Heat 15 Minutes   Moist Heat Location Cervical     Electrical Stimulation   Electrical Stimulation Location neck and upper trap   Electrical Stimulation Action IFC    Electrical Stimulation Parameters 7 ma 15 minutes   Electrical Stimulation Goals Pain                  PT Short Term Goals - 09/05/16 2119      PT SHORT TERM GOAL #1   Title The patient will demonstrate compliance with basic HEP to improve LE muscle lengths and initial LE strengthening   08/29/16   Status Achieved     PT SHORT TERM GOAL #2   Title The patient will have improved Timed up and Go test to 17 sec indicating improved gait speed for short distance ambulation   Time 4   Period Weeks   Status Partially Met     PT SHORT TERM GOAL #3   Title BERG balance test score improved to 34/56 indicating improved static and dynamic balance for decreased fall risk   Time 4   Period Weeks   Status On-going     PT SHORT TERM GOAL #4   Title HS length improved to 55 degrees needed for improved standing and walking tolerance   Status Achieved           PT Long Term Goals - 09/05/16 2120      PT LONG TERM GOAL #1   Title The patient will be independent in a progressive HEP and appropriate Silver Sneakers program for further progress and improvements in mobility   09/26/16   Time 8   Period Weeks   Status On-going     PT LONG TERM GOAL #2   Title The patient will report a 50% improvement in pain and function with ADLS   Time 8   Period Weeks   Status On-going     PT LONG TERM GOAL #3   Title The patient will have bilateral LE strength to grossly 4/5 to be able to stand 5 minutes for shaving, showering and personal care   Time 8   Period Weeks   Status On-going     PT LONG TERM GOAL #4   Title Patient will be able to ambulate 240 feet with RW needed for short distance community ambulation    Period Weeks   Status On-going     PT LONG TERM GOAL #5   Title BERG balance test score improved to 36/56 indicating decreased fall risk   Time 8   Period Weeks   Status On-going     PT LONG TERM GOAL #6   Title Timed up and Go test improved to 14 sec indicating  improved gait speed for community ambulation   Time 8  Period Weeks   Status On-going               Plan - 09/07/16 0859    Clinical Impression Statement Pt feeling ok today. 6 minute walk test assesed and pt unable to walk for full 6 minutes due to fatigue and discomfort in Lt shoudler. Pt able to tolerate all exercises well. Did well with standing extension exercise. Will continue to strengthen Bil LE and increase posture for better ambulation effeciency and decreased neck pain.    Rehab Potential Good   Clinical Impairments Affecting Rehab Potential DM with LE neuropathy, CAD, CHF, vertigo, Bilateral TKR 10+ years ago   PT Frequency 2x / week   PT Duration 8 weeks   PT Treatment/Interventions ADLs/Self Care Home Management;Electrical Stimulation;Cryotherapy;Moist Heat;Ultrasound;Balance training;Therapeutic exercise;Neuromuscular re-education;Patient/family education;Manual techniques;Taping   PT Next Visit Plan Nu-Step without arms  LE stretching no strap secondary to UE pain;    LE strengthening in sitting, supine and standing;  core activation low level ex   Consulted and Agree with Plan of Care Patient      Patient will benefit from skilled therapeutic intervention in order to improve the following deficits and impairments:  Abnormal gait, Decreased activity tolerance, Decreased balance, Decreased mobility, Decreased endurance, Decreased range of motion, Difficulty walking, Impaired flexibility, Pain, Postural dysfunction  Visit Diagnosis: Muscle weakness (generalized)  Repeated falls  Difficulty in walking, not elsewhere classified  Pain in right knee    PHYSICAL THERAPY DISCHARGE SUMMARY  Visits from Start of Care: 11  Current functional level related to goals / functional outcomes: After conclusion of appointment, the patient decided he would like to discontinue PT.  He would like to follow up with his doctor to rule out pathology on his neck and shoulder and get  MRI.  Will discharge patient from PT at his request with partial goals met.     Remaining deficits: As above.    Education / Equipment: HEP Plan: Patient agrees to discharge.  Patient goals were partially met. Patient is being discharged due to the patient's request.  ?????        G code:  Mobility goal CK, Current CL   Problem List Patient Active Problem List   Diagnosis Date Noted  . OAB (overactive bladder) 03/26/2015  . Chronic diastolic congestive heart failure ([place]) 11/23/2014  . CAD (coronary artery disease) 10/14/2014  . Diabetic peripheral neuropathy associated with type 2 diabetes mellitus ([place]) 05/30/2010  . Essential hypertension 08/20/2007  . Uncontrolled type 2 diabetes mellitus with peripheral circulatory disorder ([place]) 06/13/2007  . Hyperlipemia 06/13/2007  . GERD 06/13/2007    Mikle Bosworth PTA 09/07/2016, 9:30 AM  [facility] [address] [place], [place], [address] Phone: [phone]   Fax:  [phone]  Name: DORANCE SPINK MRN: [id number] Date of Birth: 15-Jan-1933

## 2016-09-09 ENCOUNTER — Other Ambulatory Visit: Payer: Self-pay | Admitting: Family Medicine

## 2016-09-11 ENCOUNTER — Encounter: Payer: Medicare Other | Admitting: Physical Therapy

## 2016-09-13 ENCOUNTER — Other Ambulatory Visit: Payer: Self-pay | Admitting: Internal Medicine

## 2016-09-13 ENCOUNTER — Encounter: Payer: Medicare Other | Admitting: Physical Therapy

## 2016-09-20 ENCOUNTER — Ambulatory Visit: Payer: Self-pay | Admitting: Surgery

## 2016-09-21 ENCOUNTER — Ambulatory Visit: Payer: Medicare Other

## 2016-09-30 ENCOUNTER — Other Ambulatory Visit: Payer: Self-pay | Admitting: Family Medicine

## 2016-10-09 ENCOUNTER — Other Ambulatory Visit: Payer: Self-pay | Admitting: Internal Medicine

## 2016-10-13 ENCOUNTER — Other Ambulatory Visit: Payer: Self-pay | Admitting: Internal Medicine

## 2016-10-16 LAB — HM DIABETES EYE EXAM

## 2016-10-17 ENCOUNTER — Encounter: Payer: Self-pay | Admitting: Internal Medicine

## 2016-10-17 ENCOUNTER — Ambulatory Visit (INDEPENDENT_AMBULATORY_CARE_PROVIDER_SITE_OTHER): Payer: Medicare Other | Admitting: Internal Medicine

## 2016-10-17 VITALS — BP 122/70 | HR 75 | Ht 68.0 in | Wt 210.0 lb

## 2016-10-17 DIAGNOSIS — E1165 Type 2 diabetes mellitus with hyperglycemia: Secondary | ICD-10-CM | POA: Diagnosis not present

## 2016-10-17 DIAGNOSIS — E1151 Type 2 diabetes mellitus with diabetic peripheral angiopathy without gangrene: Secondary | ICD-10-CM

## 2016-10-17 DIAGNOSIS — IMO0002 Reserved for concepts with insufficient information to code with codable children: Secondary | ICD-10-CM

## 2016-10-17 LAB — POCT GLYCOSYLATED HEMOGLOBIN (HGB A1C): Hemoglobin A1C: 6.8

## 2016-10-17 NOTE — Progress Notes (Signed)
Subjective:     Patient ID: [name], male   DOB: September 08, 1933, 82 y.o.   MRN: [id number]  HPI [name] is a very pleasant 82 y.o. man returning for management of DM2, dx 07/2012, uncontrolled, insulin-dependent, with complications (CAD, peripheral neuropathy, PAD). Last visit 3 mo ago.  Still using a walker after his fall in 09/2015 >> still weakness in legs. He was doing physical therapy. He is still walking with a walker.  Last HbA1C was: Lab Results  Component Value Date   HGBA1C 7.6 07/17/2016   HGBA1C 7.4 04/12/2016   HGBA1C 7.8 01/14/2016   He is on: - Metformin 1000 mg 2x a day - Levemir 20 >> 22 >> 20 units at bedtime - Humalog 15 min before a meal:  6 units before a small meal 8 units before a regular meal 10 units before a large meal Please subtract 2 units if you plan to be active after a meal. Please subtract 2 units if you plan to be active after a meal.  Pt again brings a great log, he checks sugars ~2-4x a day >> better: - am: 109-146, 171 >> 75 (if no bedtime snack)-145, 161 >> 109-156 >> 88-126 - 2h after b'fast: 119-170 >>136-162 >> 61, 110-170, 180 >> 125-168, 210 >> 84-153 - before lunch: 92-140 >> 85, 123-160 >> 91-150, 177 >> 98-155, 168 >> 72-117, 150 - 2h after lunch: 140-183 >> 123-180 >> 105-170 >> 155-185 >> 80-150 - dinner:104-174, 201>> 114-180 >> 78, 88-175 >> 128-165, 171 >> 86, 90-137 - 2h after dinner150-170 >> 140-160, 180 >> 135, 149-165, 185 >> 131-171 >> 80-153 - bedtime: 105-161 >> 116-184 >> 130-175 >> 125-170, 216 >> 108-174 >> 89-154, 160 Has hypoglycemia awareness at 70.  - no CKD. Last BUN/Cr: Lab Results  Component Value Date   BUN 23 04/20/2016   CREATININE 0.86 04/20/2016  Not on an ACEI. - last lipid panel: Lab Results  Component Value Date   CHOL 97 04/20/2016   HDL 35.40 (L) 04/20/2016   LDLCALC 48 04/20/2016   TRIG 70.0 04/20/2016   CHOLHDL 3 04/20/2016  Off Pravastatin. - He has peripheral neuropathy. He is  taking a B complex vitamin, and has a neuropathy cream. [name] [facility].  - Last eye exam 10/16/2016: no DR (Dr. Alois Cliche). Cataract Sx in 08/2015. Has macular degeneration >> started Preservision Areds vitamins. - On ASA 81.  He had a heart catheterization on 09/03/2014 >> 2 small Blockages >> no intervention other than statins. 2D ECHO with normal EF.  In 08/2015 he was hospitalized for ?sepsis with strep pneumoniae after acquiring left lower lobe pneumonia.   I reviewed pt's medications, allergies, PMH, social hx, family hx, and changes were documented in the history of present illness. Otherwise, unchanged from my initial visit note. He started [employer].  Review of Systems Constitutional: no weight gain, no fatigue, no subjective hyperthermia/hypothermia Eye: no blurry vision, no xerophthalmia ENT: no sore throat, no dysphagia/odynophagia/hoarseness Cardiovascular: no CP/SOB/no palpitations/leg swelling Respiratory: no cough/SOB Gastrointestinal: no N/V/D/C Musculoskeletal: no muscle/joint aches Skin: no rash Neuro: no tremors; + weakness in legs   Objective:   Physical Exam BP 122/70 (BP Location: Left Arm, Patient Position: Sitting)   Pulse 75   Ht 5\' 8"  (1.727 m)   Wt 210 lb (95.3 kg)   SpO2 94%   BMI 31.93 kg/m  Body mass index is 31.93 kg/m.  Wt Readings from Last 3 Encounters:  10/17/16 210  lb (95.3 kg)  07/27/16 204 lb 14.4 oz (92.9 kg)  07/17/16 210 lb (95.3 kg)  Constitutional: normal weight, in NAD Eyes: PERRLA, EOMI, no exophthalmos ENT: moist mucous membranes, no thyromegaly, no cervical lymphadenopathy Cardiovascular: RRR, + SEM 2/6, no RG, no LE edema today (wears compression hoses) Respiratory: CTA B Gastrointestinal: abdomen soft, NT, ND, BS+ Skin: moist, warm, no rashes Neurological: no tremor with outstretched hands  Assessment:     1. DM2, insulin-dependent, uncontrolled, with complications; target [id number] for him: 7-7.5% -  CAD - peripheral neuropathy - PAD Dr. [name]  Plan:     1. Pt with long standing Diabetes mellitus type 2, with improved control after increasing mealtime insulin. He has some postprandial sugars in the 80s >> advised to also use the 6 units of Humalog before a smaller meal as he is now only using the 8 unit dose. Also, before a larger meal (e.g. Thanksgiving dinner), he may use a 10 unit dose. - I advised him to: Patient Instructions  Please continue: - Metformin 1000 mg 2x a day - Levemir 20 units at bedtime. - Humalog 15 min before a meal:  6 units before a small meal (try to also use this occasionally) 8 units before a regular meal 10 units before a large meal Please subtract 2 units if you plan to be active after a meal.  Please come back for a follow-up appointment in 3 months.  - he is very conscientious about taking his medicines and keeping a good CBG log >> continue checking CBGs 3x a day.  - up to date with eye exams  - UTD with flu shot - Will check HbA1c today >> 6.8% (better) - RTC in 3 mo with sugar log.  Philemon Kingdom, MD PhD [facility] Endocrinology

## 2016-10-17 NOTE — Addendum Note (Signed)
Addended by: Caprice Beaver T on: 10/17/2016 08:26 AM   Modules accepted: Orders

## 2016-10-17 NOTE — Patient Instructions (Addendum)
Please continue: - Metformin 1000 mg 2x a day - Levemir 20 units at bedtime. - Humalog 15 min before a meal:  6 units before a small meal (try to also use this occasionally) 8 units before a regular meal 10 units before a large meal Please subtract 2 units if you plan to be active after a meal.  Please come back for a follow-up appointment in 3 months.

## 2016-10-27 ENCOUNTER — Encounter: Payer: Self-pay | Admitting: Family Medicine

## 2016-10-27 ENCOUNTER — Ambulatory Visit (INDEPENDENT_AMBULATORY_CARE_PROVIDER_SITE_OTHER): Payer: Medicare Other | Admitting: Family Medicine

## 2016-10-27 VITALS — BP 130/68 | HR 89 | Temp 97.9°F | Ht 68.0 in | Wt 210.7 lb

## 2016-10-27 DIAGNOSIS — E1165 Type 2 diabetes mellitus with hyperglycemia: Secondary | ICD-10-CM

## 2016-10-27 DIAGNOSIS — I5032 Chronic diastolic (congestive) heart failure: Secondary | ICD-10-CM | POA: Diagnosis not present

## 2016-10-27 DIAGNOSIS — E785 Hyperlipidemia, unspecified: Secondary | ICD-10-CM

## 2016-10-27 DIAGNOSIS — E1142 Type 2 diabetes mellitus with diabetic polyneuropathy: Secondary | ICD-10-CM

## 2016-10-27 DIAGNOSIS — I2510 Atherosclerotic heart disease of native coronary artery without angina pectoris: Secondary | ICD-10-CM

## 2016-10-27 DIAGNOSIS — I2583 Coronary atherosclerosis due to lipid rich plaque: Secondary | ICD-10-CM

## 2016-10-27 DIAGNOSIS — E1151 Type 2 diabetes mellitus with diabetic peripheral angiopathy without gangrene: Secondary | ICD-10-CM

## 2016-10-27 DIAGNOSIS — I10 Essential (primary) hypertension: Secondary | ICD-10-CM

## 2016-10-27 DIAGNOSIS — IMO0002 Reserved for concepts with insufficient information to code with codable children: Secondary | ICD-10-CM

## 2016-10-27 LAB — BASIC METABOLIC PANEL
BUN: 16 mg/dL (ref 6–23)
CHLORIDE: 99 meq/L (ref 96–112)
CO2: 28 meq/L (ref 19–32)
CREATININE: 0.92 mg/dL (ref 0.40–1.50)
Calcium: 9.4 mg/dL (ref 8.4–10.5)
GFR: 83.91 mL/min (ref >60.00)
GLUCOSE: 257 mg/dL — AB (ref 70–99)
Potassium: 4.7 mEq/L (ref 3.5–5.1)
SODIUM: 136 meq/L (ref 135–145)

## 2016-10-27 LAB — CBC
HEMATOCRIT: 37.1 % — AB (ref 39.0–52.0)
Hemoglobin: 12.1 g/dL — ABNORMAL LOW (ref 13.0–17.0)
MCHC: 32.7 g/dL (ref 30.0–36.0)
MCV: 86.6 fl (ref 78.0–100.0)
PLATELETS: 171 10*3/uL (ref 150.0–400.0)
RBC: 4.28 Mil/uL (ref 4.22–5.81)
RDW: 14.8 % (ref 11.5–15.5)
WBC: 7 10*3/uL (ref 4.0–10.5)

## 2016-10-27 NOTE — Progress Notes (Signed)
HPI:  [name] is a pleasant 82 year old here for follow-up. He has a past medical history significant for diabetes, hypertension, hyperlipidemia, heart failure, coronary artery disease, anemia and OA here for a follow-up visit. He reports he has had minor aches and pains chronically, mainly in his neck. He reports he was diagnosed with osteoarthritis of the neck remotely. He did some rehabilitation recently for this, but does not think it helped much. Tolerable, and does not want to take pain medications or do procedures for this. Otherwise he is doing well. He is happy that his diabetes is under control. No reported chest pain, shortness breath, swelling or palpitations. A summary of his chronic medical issues can be found below.  Diabetes w peripheral circulatory disorder - Last A1c 123456 7.6 -complications: peripheral neuropathy -managed by endocrinology  -meds: metformin, Levimir, insulin with meals -eye exam: utd, Dr. Idolina Primer  -foot exam: utd -denies:hypoglycemia, vision changes   Essential hypertension - stable  -meds: norvasc 5, doxasosin 4, lasix 40,  -denies: CP, SOB, DOE   CAD, Chronic diastolic congestive heart failure - 2-D echo on 08/26/14 showed EF 60-65% with grade 1 diastolic dysfunction.  -cath 2015 30% LM, 40-50%LAD  -meds: asa, statin, lasix  - denies swelling, SOB, DOE -cardiologist: Dr. [name]  OAB (overactive bladder) -meds: Cardura, myrbetriq, vesicare -stable  -Sees [facility]ist, Dr. Patsy [place]  GERD: -meds: prilosec  Hyperlipemia - stable  - meds: Pravastatin   Anemia: -during hospital stay 2016 and mild in the past on review cardiology records  -no bleeding    ROS: See pertinent positives and negatives per HPI.  Past Medical History:  Diagnosis Date  . Anemia   . Atherosclerotic PVD with intermittent claudication ([place])   . CAD (coronary artery disease)   . Diabetes mellitus    type 2 with peripheral neuropathy  .  Diastolic CHF, chronic ([place])   . Diverticulosis   . DVT (deep venous thrombosis) ([place])   . Dyslipidemia   . Edema 08/19/2014  . GERD (gastroesophageal reflux disease)    hx hiatal hernia  . HTN (hypertension)   . IBS (irritable bowel syndrome)    Hx: of  . Osteoarthritis   . Overactive bladder    Hx: of stress incontinence  . Pneumonia 2002   history of  . Shingles   . Varicose veins of lower extremities with other complications 0000000    Past Surgical History:  Procedure Laterality Date  . BACK SURGERY    . CATARACT EXTRACTION Bilateral 08/10/2015,08/15/2015  . COLONOSCOPY     Hx: of  . CTS bilateral    . KNEE ARTHROSCOPY  1988, 1992   bilateral  . LAMINECTOMY  08/18/2013   DECOMPRESSIVE LAMINECTOMIES  . LEFT AND RIGHT HEART CATHETERIZATION WITH CORONARY ANGIOGRAM N/A 09/03/2014   Procedure: LEFT AND RIGHT HEART CATHETERIZATION WITH CORONARY ANGIOGRAM;  Surgeon: [name], MD;  Location[facility] C[facility] LAB;  Service: Cardiovascular;  Laterality: N/A;  . LUMBAR LAMINECTOMY/DECOMPRESSION MICRODISCECTOMY N/A 08/18/2013   Procedure: Lumbar two-three, lumbar three-four, lumbar four-five decompressive laminectomies;  Surgeon: [name], MD;  Location: [place] NEURO ORS;  Service: Neurosurgery;  Laterality: N/A;  . lumbar left arm  1961  . TONSILLECTOMY    . TOTAL KNEE ARTHROPLASTY  2005, 2007  . wrist tendon surgery      Family History  Problem Relation Age of Onset  . Other Mother 36    CABG  . Heart disease Mother   . Hypertension Mother   .  Hyperlipidemia Son     Social History   Social History  . Marital status: Married    Spouse name: N/A  . Number of children: 2  . Years of education: N/A   Social History Main Topics  . Smoking status: Never Smoker  . Smokeless tobacco: Never Used  . Alcohol use No  . Drug use: No  . Sexual activity: Not Asked   Other Topics Concern  . 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  acetaminophen (TYLENOL) 500 MG tablet, Take 500 mg by mouth 2 (two) times daily., Disp: , Rfl:  .  amLODipine (NORVASC) 5 MG tablet, TAKE ONE TABLET BY MOUTH DAILY, Disp: 90 tablet, Rfl: 3 .  aspirin 81 MG tablet, Take 81 mg by mouth daily. , Disp: , Rfl:  .  B-D UF III MINI PEN NEEDLES 31G X 5 MM MISC, USE 4X A DAY AS DIRECTED, Disp: 200 each, Rfl: 4 .  cholecalciferol (VITAMIN D) 1000 units tablet, Take 2,000 Units by mouth daily., Disp: , Rfl:  .  doxazosin (CARDURA) 4 MG tablet, TAKE ONE TABLET BY MOUTH DAILY, Disp: 90 tablet, Rfl: 1 .  furosemide (LASIX) 40 MG tablet, Take 1 tablet (40 mg total) by mouth daily., Disp: 30 tablet, Rfl: 11 .  glucose blood (ONETOUCH VERIO) test strip, USE TO TEST BLOOD SUGAR 3 TIMES DAILY AS INSTRUCTED. DX: E11.51, Disp: 100 each, Rfl: 5 .  HUMALOG KWIKPEN 100 UNIT/ML KiwkPen, INJECT 6 TO 10 UNITS BEFORE EACH MEAL 3 TIMES A DAY, Disp: 15 pen, Rfl: 2 .  LEVEMIR FLEXTOUCH 100 UNIT/ML Pen, INJECT 20 UNITS INTO THE SKIN DAILY AT 10 PM., Disp: 15 pen, Rfl: 1 .  metFORMIN (GLUCOPHAGE) 1000 MG tablet, TAKE ONE TABLET BY MOUTH TWICE A DAY WITH FOOD, Disp: 60 tablet, Rfl: 0 .  Multiple Vitamins-Minerals (CENTRUM SILVER PO), Take 1 tablet by mouth daily. , Disp: , Rfl:  .  MYRBETRIQ 50 MG TB24 tablet, Take 50 mg by mouth at bedtime. , Disp: , Rfl:  .  omeprazole (PRILOSEC) 20 MG capsule, TAKE 1 CAPSULE (20 MG TOTAL) BY MOUTH DAILY., Disp: 90 capsule, Rfl: 1 .  ONETOUCH DELICA LANCETS 99991111 MISC, USE TO TEST BLOOD SUGAR 3 TIMES A DAY AS INSTRUCTED., Disp: 300 each, Rfl: 2 .  ONETOUCH VERIO test strip, USE TO TEST BLOOD SUGAR 3 TIMES DAILY AS INSTRUCTED., Disp: 100 each, Rfl:  6 .  pravastatin (PRAVACHOL) 40 MG tablet, TAKE 1 TABLET (40 MG TOTAL) BY MOUTH EVERY EVENING., Disp: 30 tablet, Rfl: 11 .  Propylene Glycol (SYSTANE BALANCE) 0.6 % SOLN, Apply to eye. 2 drops each eye once daily, Disp: , Rfl:  .  VESICARE 5 MG tablet, , Disp: , Rfl:   EXAM:  Vitals:   10/27/16 0835  BP: 130/68  Pulse: 89  Temp: 97.9 F (36.6 C)    Body mass index is 32.04 kg/m.  GENERAL: vitals reviewed and listed above, alert, oriented, appears well hydrated and in no acute distress  HEENT: atraumatic, conjunttiva clear, no  obvious abnormalities on inspection of external nose and ears  NECK: no obvious masses on inspection  LUNGS: clear to auscultation bilaterally, no wheezes, rales or rhonchi, good air movement  CV: HRRR, no peripheral edema  MS: moves all extremities without noticeable abnormality  PSYCH: pleasant and cooperative, no obvious depression or anxiety  ASSESSMENT AND PLAN:  Discussed the following assessment and plan:  Essential hypertension - Plan: Basic metabolic panel, CBC (no diff)  Chronic diastolic congestive heart failure (HCC)  Coronary artery disease due to lipid rich plaque  Diabetic peripheral neuropathy associated with type 2 diabetes mellitus (HCC)  Hyperlipidemia, unspecified hyperlipidemia type  Uncontrolled type 2 diabetes mellitus with peripheral circulatory disorder ([place])  -Spent some time discussing the various options for treatment of chronic musculoskeletal pain - prefers to continue current treatment; did show him some neck strengthening -lifestyle recs -labs -Patient advised to return or notify a doctor immediately if symptoms worsen or persist or new concerns arise.  Patient Instructions  BEFORE YOU LEAVE: -please obtain and update eye exam -labs -follow up: 3 months  We have ordered labs or studies at this visit. It can take up to 1-2 weeks for results and processing. IF results require follow up or explanation, we  will call you with instructions. Clinically stable results will be released to your [facility]. If you have not heard from [place] or cannot find your results in [facility] in 2 weeks please contact our office at [phone].  If you are not yet signed up for [facility], please consider signing up.    We recommend the following healthy lifestyle for LIFE: 1) Small portions.   Tip: eat off of a salad plate instead of a dinner plate.  Tip: if you need more or a snack choose fruits, veggies and/or a handful of nuts or seeds.  2) Eat a healthy clean diet.  * Tip: Avoid (less then 1 serving per week): processed foods, sweets, sweetened drinks, white starches (rice, flour, bread, potatoes, pasta, etc), red meat, fast foods, butter  *Tip: CHOOSE instead   * 5-9 servings per day of fresh or frozen fruits and vegetables (but not corn, potatoes, bananas, canned or dried fruit)   *nuts and seeds, beans   *olives and olive oil   *small portions of lean meats such as fish and white chicken    *small portions of whole grains  3)Get at least 150 minutes of sweaty aerobic exercise per week.  4)Reduce stress - consider counseling, meditation and relaxation to balance other aspects of your life.         Colin Benton R., DO

## 2016-10-27 NOTE — Patient Instructions (Signed)
BEFORE YOU LEAVE: -please obtain and update eye exam -labs -follow up: 3 months  We have ordered labs or studies at this visit. It can take up to 1-2 weeks for results and processing. IF results require follow up or explanation, we will call you with instructions. Clinically stable results will be released to your [facility]. If you have not heard from [place] or cannot find your results in [facility] in 2 weeks please contact our office at [phone].  If you are not yet signed up for [facility], please consider signing up.    We recommend the following healthy lifestyle for LIFE: 1) Small portions.   Tip: eat off of a salad plate instead of a dinner plate.  Tip: if you need more or a snack choose fruits, veggies and/or a handful of nuts or seeds.  2) Eat a healthy clean diet.  * Tip: Avoid (less then 1 serving per week): processed foods, sweets, sweetened drinks, white starches (rice, flour, bread, potatoes, pasta, etc), red meat, fast foods, butter  *Tip: CHOOSE instead   * 5-9 servings per day of fresh or frozen fruits and vegetables (but not corn, potatoes, bananas, canned or dried fruit)   *nuts and seeds, beans   *olives and olive oil   *small portions of lean meats such as fish and white chicken    *small portions of whole grains  3)Get at least 150 minutes of sweaty aerobic exercise per week.  4)Reduce stress - consider counseling, meditation and relaxation to balance other aspects of your life.

## 2016-10-27 NOTE — Progress Notes (Signed)
Pre visit review using our clinic review tool, if applicable. No additional management support is needed unless otherwise documented below in the visit note. 

## 2016-10-30 ENCOUNTER — Other Ambulatory Visit: Payer: Self-pay | Admitting: Internal Medicine

## 2016-10-31 ENCOUNTER — Other Ambulatory Visit: Payer: Self-pay | Admitting: Internal Medicine

## 2016-11-21 ENCOUNTER — Other Ambulatory Visit: Payer: Self-pay | Admitting: Internal Medicine

## 2016-11-29 ENCOUNTER — Other Ambulatory Visit: Payer: Self-pay | Admitting: *Deleted

## 2016-11-29 ENCOUNTER — Other Ambulatory Visit: Payer: Self-pay | Admitting: Internal Medicine

## 2016-11-29 MED ORDER — FUROSEMIDE 40 MG PO TABS: 40.0000 mg | ORAL_TABLET | Freq: Every day | ORAL | 0 refills | Status: DC

## 2016-11-30 ENCOUNTER — Encounter: Payer: Self-pay | Admitting: Family Medicine

## 2016-12-13 ENCOUNTER — Other Ambulatory Visit: Payer: Self-pay | Admitting: Family Medicine

## 2016-12-27 ENCOUNTER — Other Ambulatory Visit: Payer: Self-pay | Admitting: Internal Medicine

## 2017-01-17 ENCOUNTER — Ambulatory Visit (INDEPENDENT_AMBULATORY_CARE_PROVIDER_SITE_OTHER): Payer: Medicare Other | Admitting: Internal Medicine

## 2017-01-17 ENCOUNTER — Encounter: Payer: Self-pay | Admitting: Internal Medicine

## 2017-01-17 VITALS — BP 124/70 | HR 81 | Ht 68.0 in | Wt 210.0 lb

## 2017-01-17 DIAGNOSIS — E1151 Type 2 diabetes mellitus with diabetic peripheral angiopathy without gangrene: Secondary | ICD-10-CM | POA: Diagnosis not present

## 2017-01-17 DIAGNOSIS — E1165 Type 2 diabetes mellitus with hyperglycemia: Secondary | ICD-10-CM | POA: Diagnosis not present

## 2017-01-17 DIAGNOSIS — IMO0002 Reserved for concepts with insufficient information to code with codable children: Secondary | ICD-10-CM

## 2017-01-17 LAB — POCT GLYCOSYLATED HEMOGLOBIN (HGB A1C): HEMOGLOBIN A1C: 6.7

## 2017-01-17 MED ORDER — INSULIN LISPRO 100 UNIT/ML (KWIKPEN): PEN_INJECTOR | SUBCUTANEOUS | 2 refills | Status: DC

## 2017-01-17 NOTE — Addendum Note (Signed)
Addended by: Caprice Beaver T on: 01/17/2017 08:44 AM   Modules accepted: Orders

## 2017-01-17 NOTE — Progress Notes (Signed)
Subjective:     Patient ID: [name], male   DOB: 01-10-1933, 83 y.o.   MRN: [id number]  HPI [name] is a very pleasant 83 y.o. man returning for management of DM2, dx 07/2012, uncontrolled, insulin-dependent, with complications (CAD, peripheral neuropathy, PAD). Last visit 3 mo ago.  Still using a walker after his fall in 09/2015 >> still weakness in legs and dyequilibrium He was doing physical therapy, now stopped, but does exercises at home  Last HbA1C was: Lab Results  Component Value Date   HGBA1C 6.8 10/17/2016   HGBA1C 7.6 07/17/2016   HGBA1C 7.4 04/12/2016   He is on: - Metformin 1000 mg 2x a day - Levemir 20 >> 22 >> 20 units at bedtime - Humalog 15 min before a meal:  6 units before a small meal 8 units before a regular meal 10 units before a large meal Please subtract 2 units if you plan to be active after a meal.  Pt again brings a great log, he checks sugars ~2-4x a day >> better: - am: 109-146, 171 >> 75 (if no bedtime snack)-145, 161 >> 109-156 >> 88-126 >> 97-131 - 2h after b'fast: 119-170 >>136-162 >> 61, 110-170, 180 >> 125-168, 210 >> 84-153 >> 91-138 - before lunch: 92-140 >> 85, 123-160 >> 91-150, 177 >> 98-155, 168 >> 72-117, 150 >> 75-116, 132 - 2h after lunch: 140-183 >> 123-180 >> 105-170 >> 155-185 >> 80-150 >> 102-148 - dinner:104-174, 201>> 114-180 >> 78, 88-175 >> 128-165, 171 >> 86, 90-137 >> 70-152 - 2h after dinner150-170 >> 140-160, 180 >> 135, 149-165, 185 >> 131-171 >> 80-153 >> 109-145 - bedtime: 105-161 >> 116-184 >> 130-175 >> 125-170, 216 >> 108-174 >> 89-154, 160 >> 91-140, 150 - nighttime: 115-160 Has hypoglycemia awareness at 70.  - no CKD. Last BUN/Cr: Lab Results  Component Value Date   BUN 16 10/27/2016   CREATININE 0.92 10/27/2016  Not on an ACEI. - last lipid panel: Lab Results  Component Value Date   CHOL 97 04/20/2016   HDL 35.40 (L) 04/20/2016   LDLCALC 48 04/20/2016   TRIG 70.0 04/20/2016   CHOLHDL 3  04/20/2016  Off Pravastatin. - He has peripheral neuropathy. He is taking a B complex vitamin, and has a neuropathy cream. Seeing [name] at [facility].  - Last eye exam 10/16/2016: no Bobby ([name]). Had Cataract Sx in 08/2015. Has macular degeneration >> on Preservision Areds vitamins. - On ASA 81.  He had a heart catheterization on 09/03/2014 >> 2 small Blockages >> no intervention other than statins. 2D ECHO with normal EF. In 08/2015 he was hospitalized for ?sepsis with strep pneumoniae after acquiring left lower lobe pneumonia.   He started B12 supplementation.  I reviewed pt's medications, allergies, PMH, social hx, family hx, and changes were documented in the history of present illness. Otherwise, unchanged from my initial visit note.   Review of Systems Constitutional: no weight gain, no fatigue, no subjective hyperthermia/hypothermia Eye: no blurry vision, no xerophthalmia ENT: no sore throat, no dysphagia/odynophagia/hoarseness Cardiovascular: no CP/SOB/no palpitations/leg swelling Respiratory: no cough/SOB Gastrointestinal: no N/V/D/C Musculoskeletal: no muscle/joint aches Skin: no rash Neuro: no tremors; + weakness in legs, + dysequilibrium  Objective:   Physical Exam BP 124/70 (BP Location: Left Arm, Patient Position: Sitting)   Pulse 81   Ht 5\' 8"  (1.727 m)   Wt 210 lb (95.3 kg)   SpO2 96%   BMI 31.93 kg/m  Body mass index is  31.93 kg/m.  Wt Readings from Last 3 Encounters:  01/17/17 210 lb (95.3 kg)  10/27/16 210 lb 11.2 oz (95.6 kg)  10/17/16 210 lb (95.3 kg)  Constitutional: normal weight, in NAD Eyes: PERRLA, EOMI, no exophthalmos ENT: moist mucous membranes, no thyromegaly, no cervical lymphadenopathy Cardiovascular: RRR, + SEM 1/6, no RG, no LE edema  Respiratory: CTA B Gastrointestinal: abdomen soft, NT, ND, BS+ Skin: moist, warm, no rashes Neurological: no tremor with outstretched hands  Assessment:     1. DM2, insulin-dependent,  uncontrolled, with complications; target [id number] for him: 7-7.5% - CAD - peripheral neuropathy - PAD [name]  Plan:     1. Pt with long standing Diabetes mellitus type 2, with improved control in last year. He had some postprandial sugars in the 80s at last visit >> advised to also use the 6 units of Humalog before a smaller meal. He did that but still feels sugars dropping especially after b'fast. He does have some 70s and 80s >> will decrease Humalog by 2 units. At next visit, may need to decrease Levemir also. - I advised him to: Patient Instructions  Please continue: - Metformin 1000 mg 2x a day - Levemir 20 units at bedtime.  Change Humalog to: - Humalog 15 min before a meal:  4 units before a small meal (use 4 units before b'fast) 6 units before a regular meal 8-10 units before a large meal Please subtract 2 units if you plan to be active after a meal.  Please come back for a follow-up appointment in 3 months.  - he brings a good CBG log >> continue checking CBGs 3x a day.  - up to date with eye exams (10/2016) - UTD with flu shot - Will check HbA1c today >> 6.7% (slightly better) - RTC in 3 mo with sugar log.  Bobby Kingdom, MD PhD [facility] Endocrinology

## 2017-01-17 NOTE — Patient Instructions (Addendum)
Please continue: - Metformin 1000 mg 2x a day - Levemir 20 units at bedtime.  Change Humalog to: - Humalog 15 min before a meal:  4 units before a small meal (use 4 units before b'fast) 6 units before a regular meal 8-10 units before a large meal Please subtract 2 units if you plan to be active after a meal.  Please come back for a follow-up appointment in 3 months.

## 2017-01-24 ENCOUNTER — Other Ambulatory Visit: Payer: Self-pay | Admitting: Internal Medicine

## 2017-01-25 IMAGING — CT CT ABD-PELV W/ CM
2 of 5 series · 15 of 46 positions shown, 17 images · IV contrast (OMNIPAQUE 300)
Comparison: Chest radiograph dated 08/26/2015, lumbar spine
radiograph dated 08/05/2015.

CLINICAL DATA: 81-year-old male with abdominal pain, weakness, and
diarrhea.

EXAM:
CT ABDOMEN AND PELVIS WITH CONTRAST
TECHNIQUE: Multidetector CT imaging of the abdomen and pelvis was performed
using the standard protocol following bolus administration of
intravenous contrast.
CONTRAST:  100mL OMNIPAQUE IOHEXOL 300 MG/ML  SOLN

[Series 2: abd/pel with · axial · 0.80mm/px · z∈[-714,-304]mm · 12 of 94 slices shown, 14 images]
[im 6/94  soft-tissue]
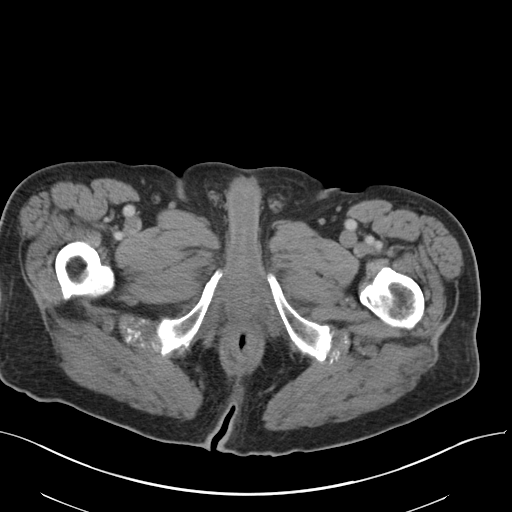
[im 6/94  bone]
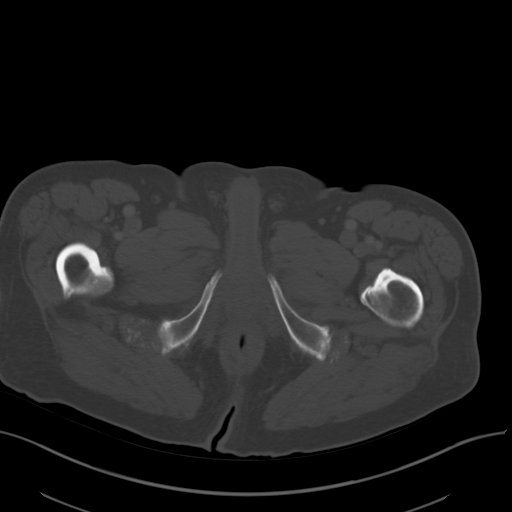
[im 16/94  soft-tissue]
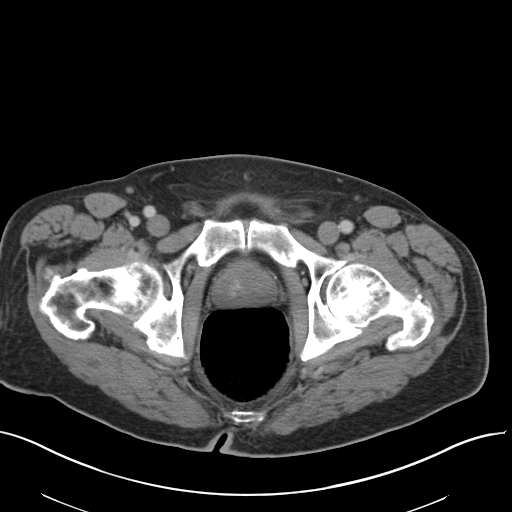
[im 21/94  soft-tissue]
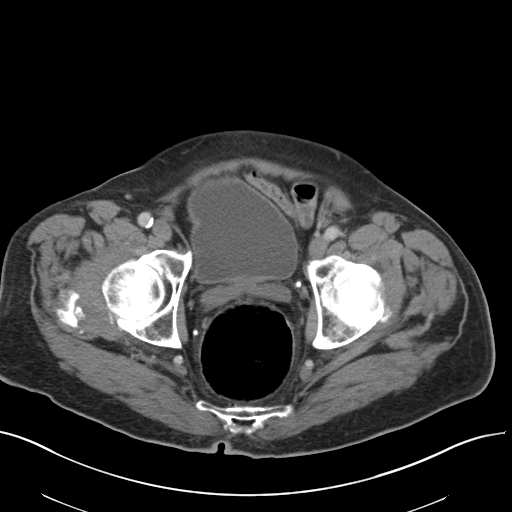
[im 26/94  soft-tissue]
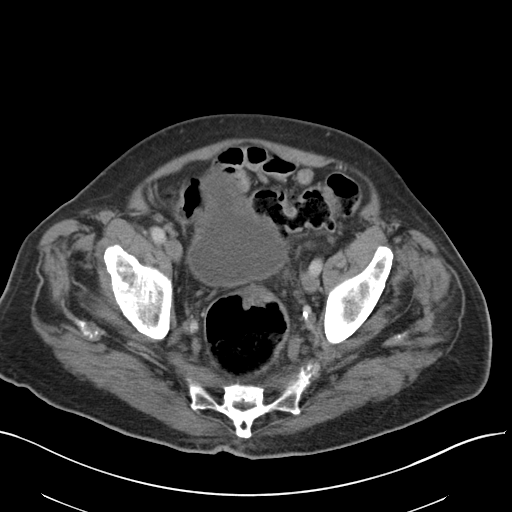
[im 37/94  soft-tissue]
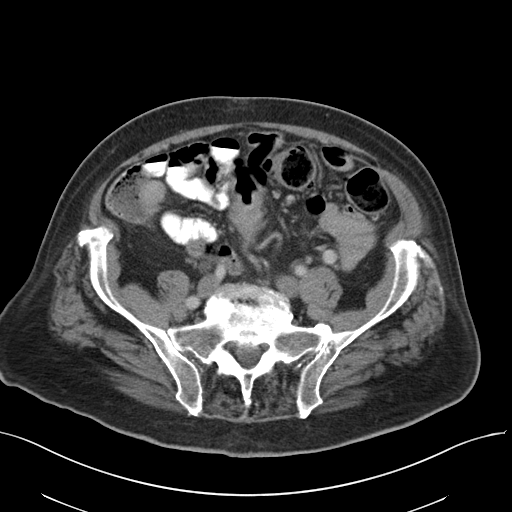
[im 42/94  soft-tissue]
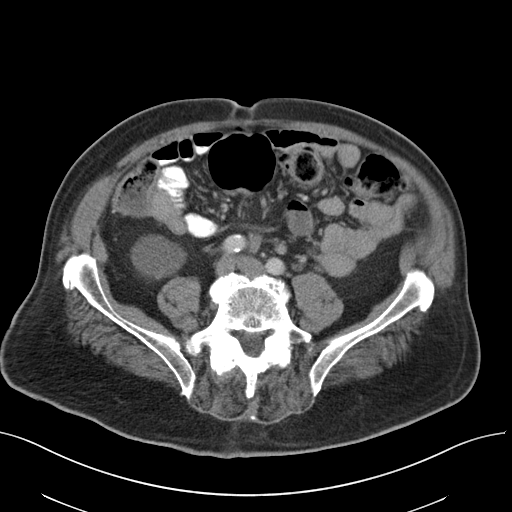
[im 52/94  soft-tissue]
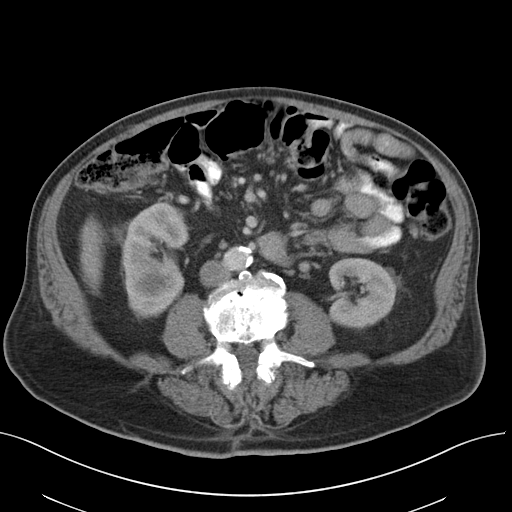
[im 57/94  soft-tissue]
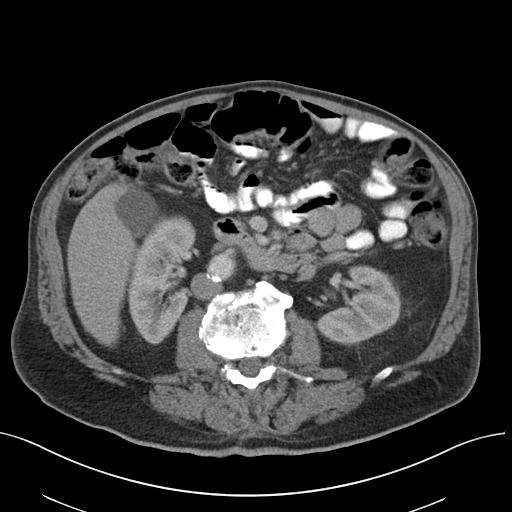
[im 68/94  soft-tissue]
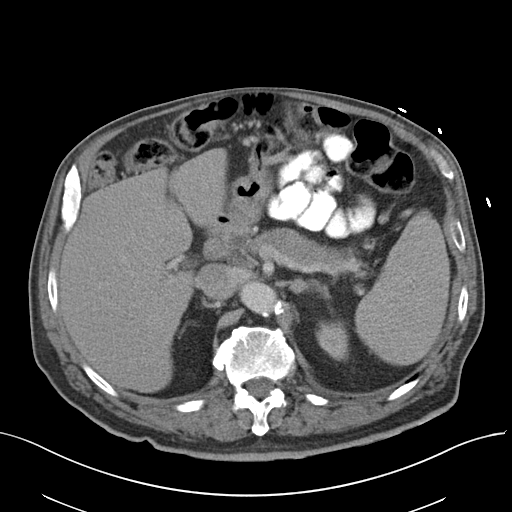
[im 68/94  bone]
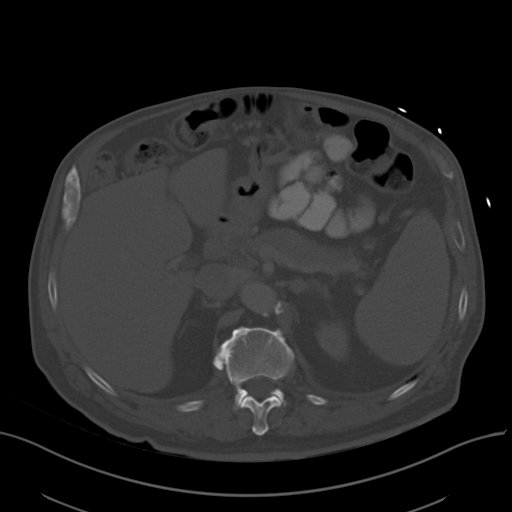
[im 73/94  soft-tissue]
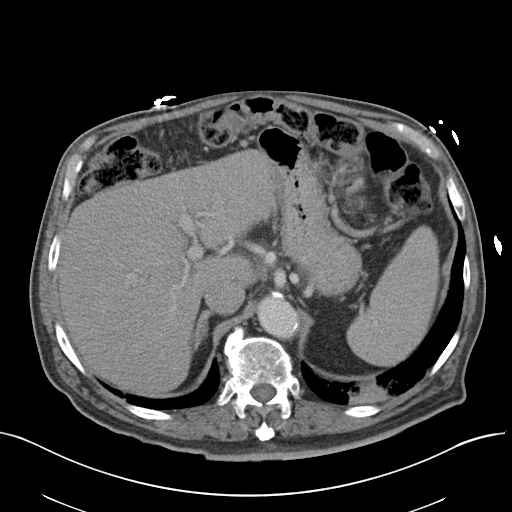
[im 78/94  soft-tissue]
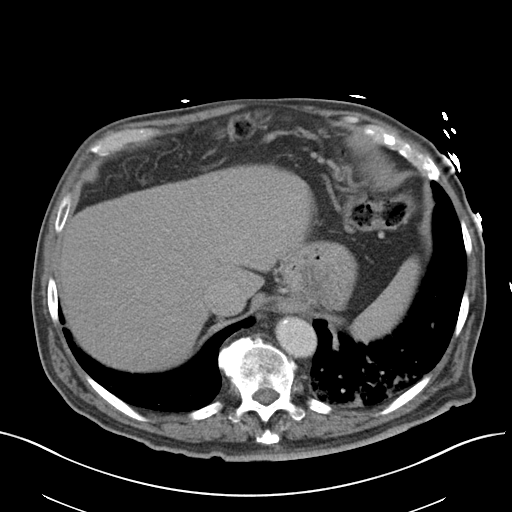
[im 88/94  soft-tissue]
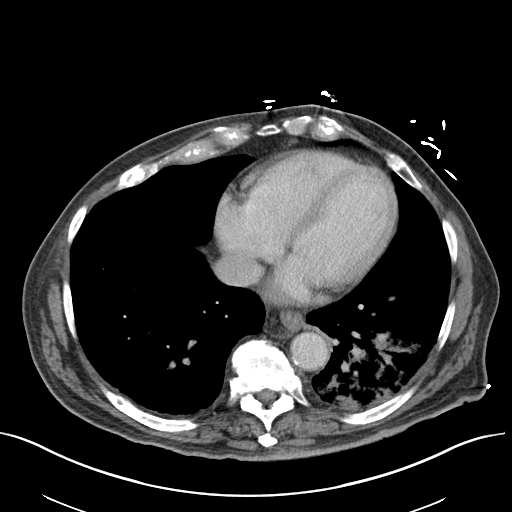

[Series 3: coronal a/|p · coronal · 0.74mm/px · 3 of 105 slices shown]
[im 35/105  soft-tissue]
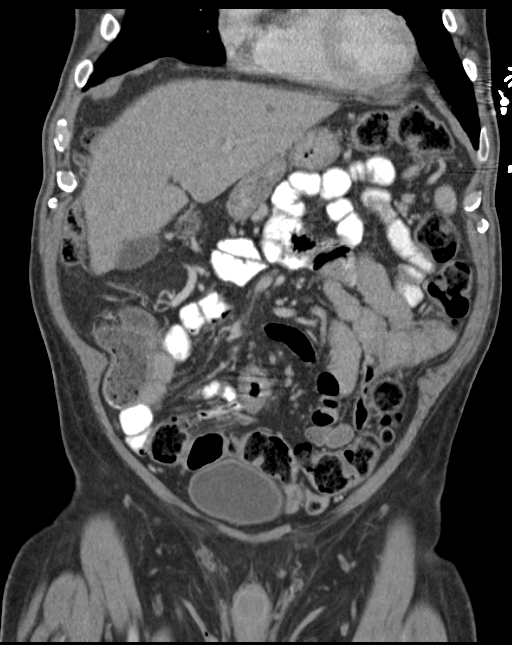
[im 47/105  soft-tissue]
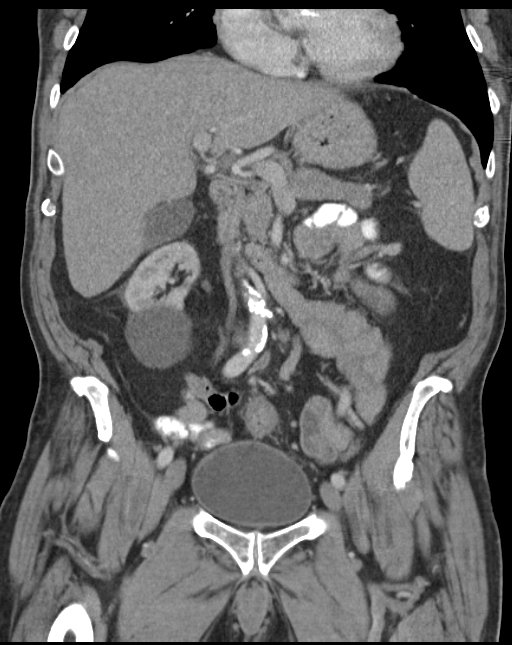
[im 58/105  soft-tissue]
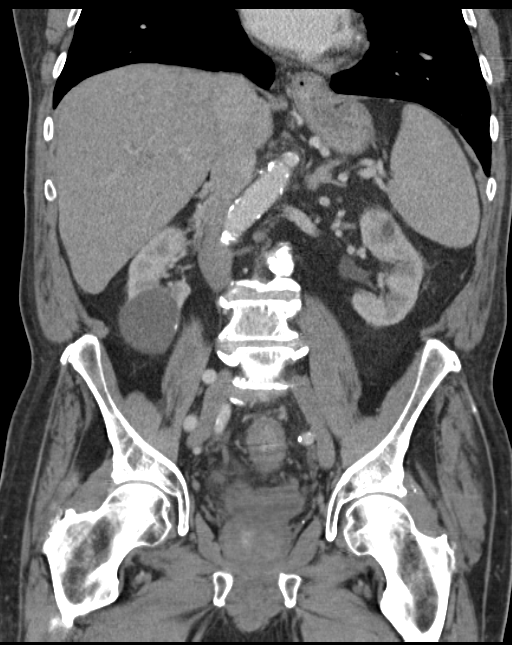

[15 of 46 positions shown; findings below may reference images not displayed]

FINDINGS: There is a patchy area of airspace opacity at the left lung base
most compatible with pneumonia. Clinical correlation and follow-up
resolution recommended. There is coronary vascular calcification. No
intra-abdominal free air or free fluid identified.

Subcentimeter left hepatic hypodense lesion, too small to
characterize. Gallstone. No pericholecystic fluid. The pancreas,
spleen, adrenal glands appear unremarkable. There is a 6.3 x 4.8 cm
cyst with partially calcified wall in the inferior pole of the right
kidney. Ultrasound may provide better evaluation. Small left renal
hypodense lesions are not well characterized but likely represent
cysts. There is no hydronephrosis on either side. The visualized
ureters and urinary bladder appear unremarkable. The prostate and
seminal vesicles are grossly unremarkable.

There is extensive colonic diverticulosis without active
inflammation. There is apparent segmental thickening of the sigmoid
colon without associated inflammatory changes, likely related to
underdistention. Colitis is less likely but not excluded. Clinical
correlation is recommended. Moderate stool noted throughout the
colon. No evidence of bowel obstruction. The appendix is not
visualized with certainty. No inflammatory changes identified in the
right lower quadrant. Small hiatal hernia.

There is aortoiliac atherosclerotic disease. The abdominal aorta is
tortuous. No portal venous gas identified. There is no
lymphadenopathy.

Small fat containing umbilical hernia. Degenerative changes of the
spine. Lower lumbar laminectomy. No acute fracture.
IMPRESSION: Left lower lobe pneumonia. Clinical correlation and follow-up
resolution recommended.

Diverticulosis.

Underdistention versus less likely segmental colitis of the sigmoid
colon. Clinical correlation recommended.

Cholelithiasis.

Right renal cyst.

## 2017-01-25 IMAGING — CR DG CHEST 2V
3 series · 3 of 3 positions shown · non-contrast
Comparison: 08/13/2013

CLINICAL DATA: Chest pain radiating to the left arm.

EXAM:
CHEST  2 VIEW

[w chest lat]
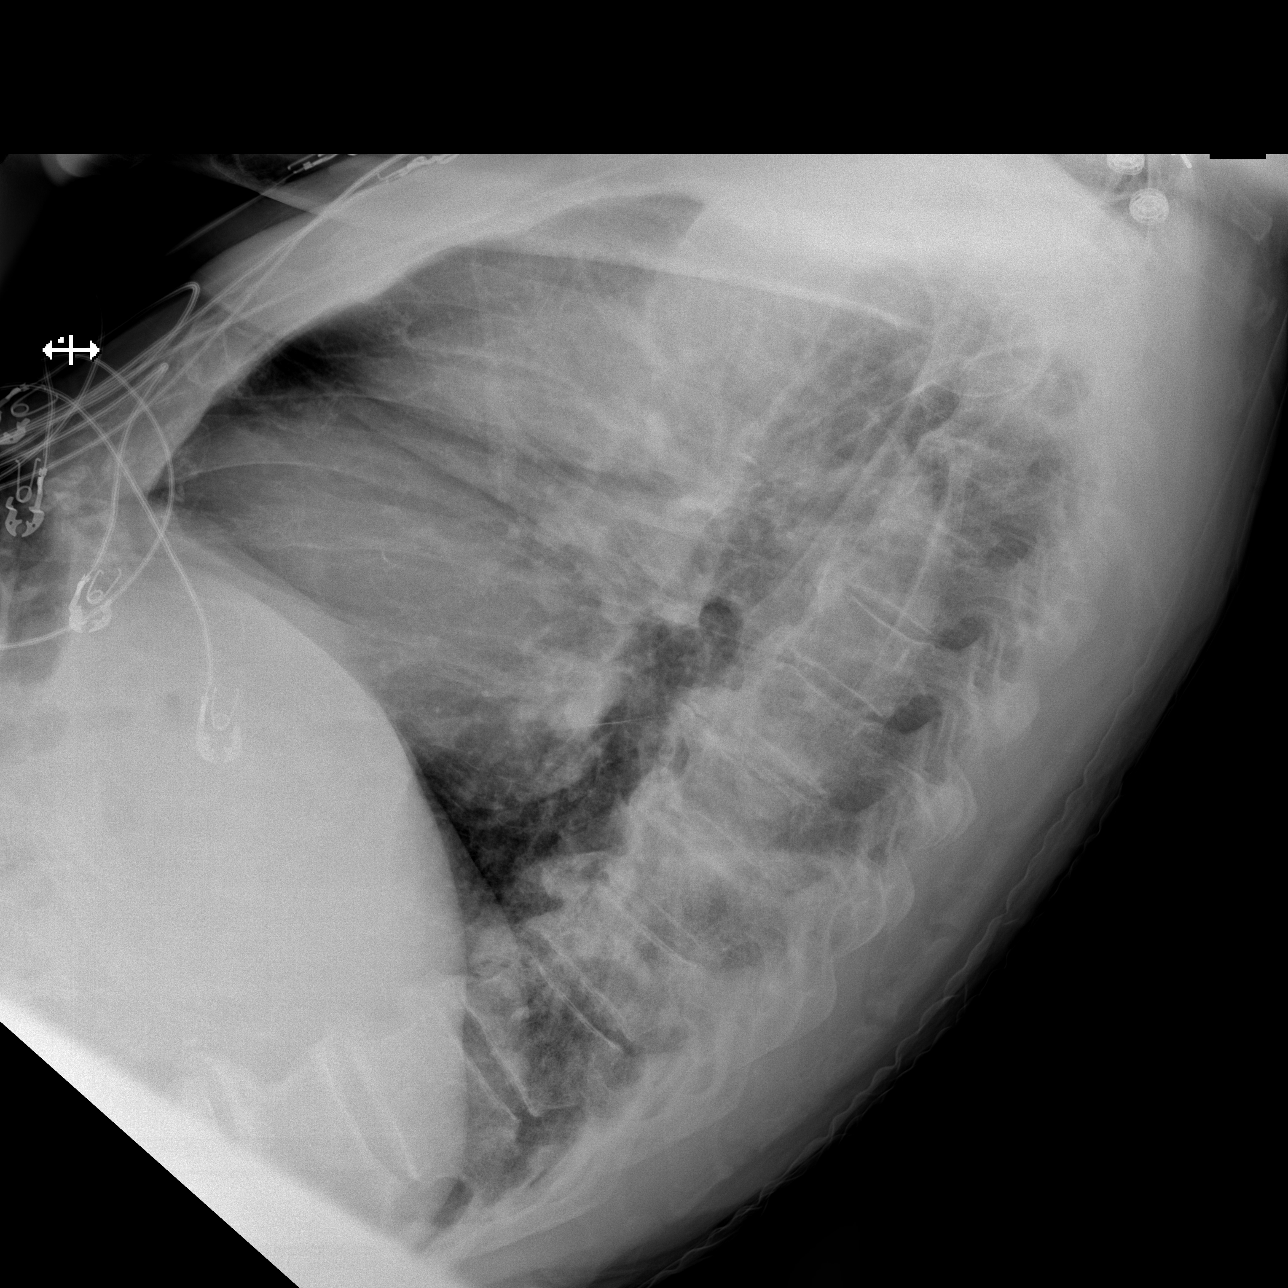

[x chest ap (1 of 2)]
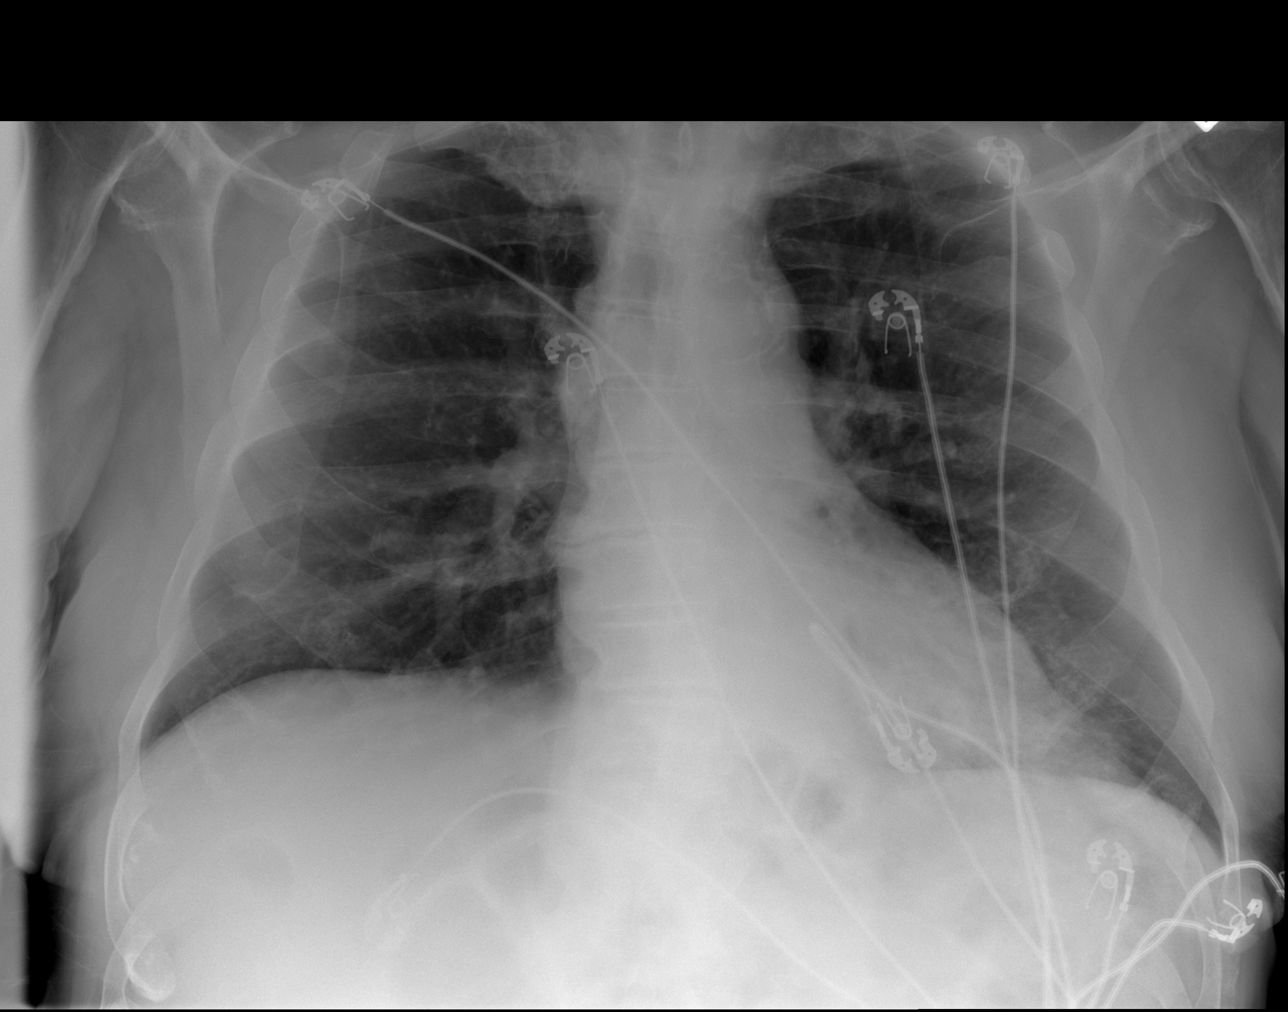

[x chest ap (2 of 2)]
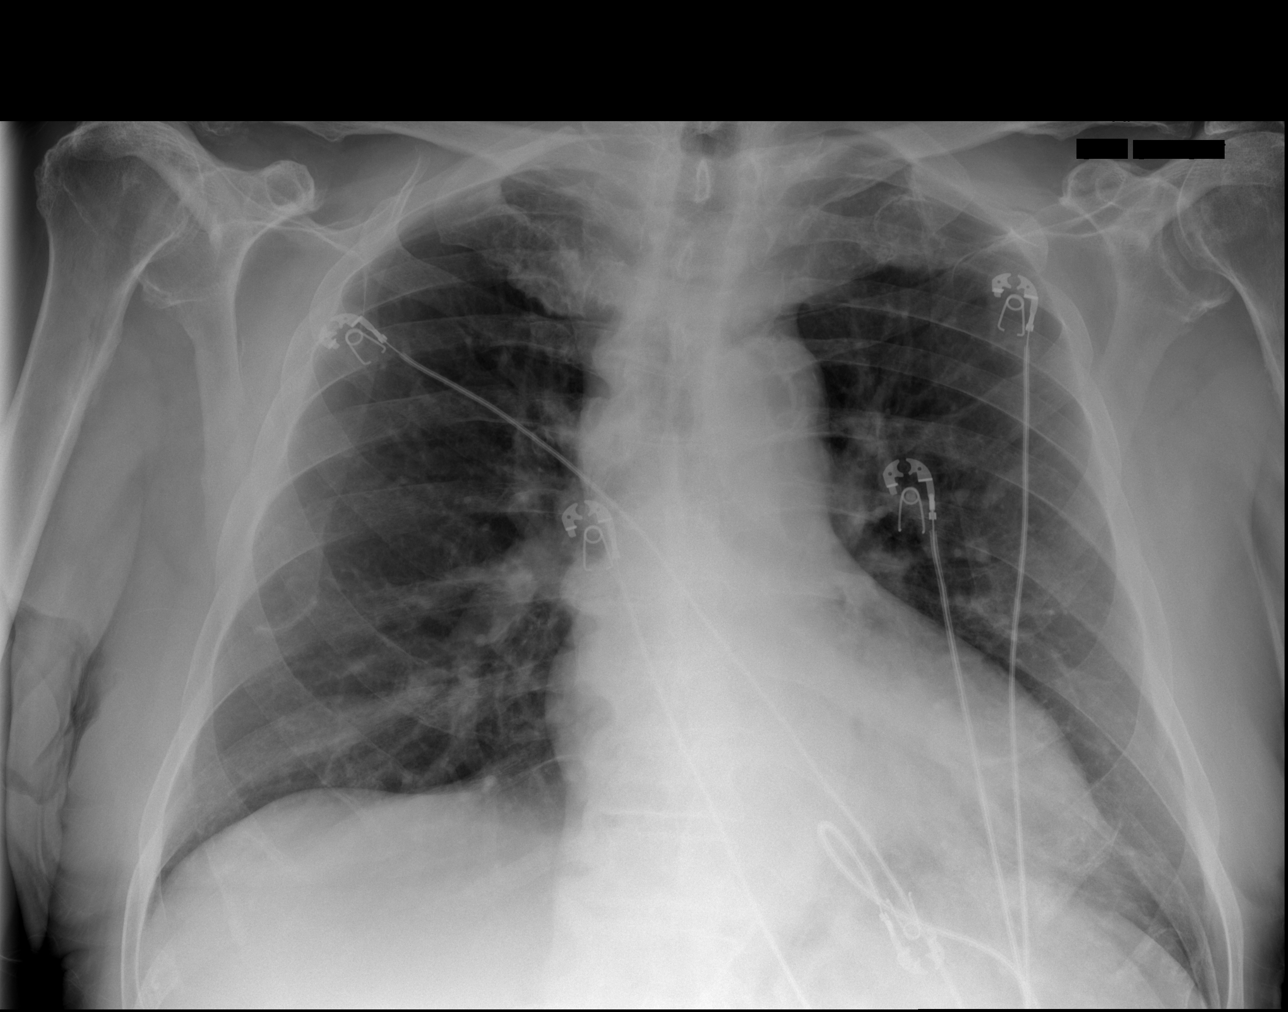

[3 of 3 positions shown; findings below may reference images not displayed]

FINDINGS: Cardiomediastinal silhouette is enlarged. Mediastinal contours
appear intact.

There is no evidence of focal airspace consolidation, pleural
effusion or pneumothorax.

Osseous structures are without acute abnormality. The changes of
thoracic spine diffuse idiopathic skeletal hyperostosis are seen.
Soft tissues are grossly normal.
IMPRESSION: Enlarged cardiac silhouette, otherwise no radiographic evidence of
acute cardiopulmonary abnormality.

## 2017-01-25 NOTE — Progress Notes (Signed)
HPI:  [name] is a pleasant 83 y.o. here for follow up. Chronic medical problems summarized below were reviewed for changes and stability and were updated as needed below. These issues and their treatment remain stable for the most part. His last Hgba1c was much better at 6.7. Reports he has been really working on a healthier lifestyle. He has has a stomach issue for the last several weeks with intermittent gas and diarrhea - sometimes 3 episode in a day. No new medications, recent abx or travel. No focal or severe abd pain. No nausea or vomiting. No hematochezia, mucus or melena. Feels well otherwise. Tolerating oral intake. Denies CP, SOB, DOE, hypoglycemia, bleeding, melena, treatment intolerance or new symptoms. Due for labs next visit.  Diabetes w peripheral circulatory disorder - Last A1c 123456 6.7 -complications: peripheral neuropathy -managed by endocrinology  -meds: metformin,Levimir, insulin with meals, asa, statin -eye exam: sees Dr. Idolina Primer  -foot exam: utd  Essential hypertension - stable  -meds: norvasc 5, doxasosin 4, lasix 40,  -denies: CP, SOB, DOE   CAD, Chronic diastolic congestive heart failure - 2-D echo on 08/26/14 showed EF 60-65% with grade 1 diastolic dysfunction.  -cath 2015 30% LM, 40-50%LAD  -meds: asa, statin, lasix  - denies swelling, SOB, DOE -cardiologist: Dr. [name]  OAB (overactive bladder) -meds: Cardura, myrbetriq, vesicare -stable  -Sees [facility]ist, Dr. Patsy Baltimore  GERD: -meds: prilosec  Hyperlipemia - stable  - meds: Pravastatin   Anemia: -during hospital stay 2016 and mild in the past on review cardiology records  -no bleeding     ROS: See pertinent positives and negatives per HPI.  Past Medical History:  Diagnosis Date  . Anemia   . Atherosclerotic PVD with intermittent claudication ([place])   . CAD (coronary artery disease)   . Diabetes mellitus    type 2 with peripheral neuropathy  . Diastolic CHF,  chronic ([place])   . Diverticulosis   . DVT (deep venous thrombosis) ([place])   . Dyslipidemia   . Edema 08/19/2014  . GERD (gastroesophageal reflux disease)    hx hiatal hernia  . HTN (hypertension)   . IBS (irritable bowel syndrome)    Hx: of  . Osteoarthritis   . Overactive bladder    Hx: of stress incontinence  . Pneumonia 2002   history of  . Shingles   . Varicose veins of lower extremities with other complications 0000000    Past Surgical History:  Procedure Laterality Date  . BACK SURGERY    . CATARACT EXTRACTION Bilateral 08/10/2015,08/15/2015  . COLONOSCOPY     Hx: of  . CTS bilateral    . KNEE ARTHROSCOPY  1988, 1992   bilateral  . LAMINECTOMY  08/18/2013   DECOMPRESSIVE LAMINECTOMIES  . LEFT AND RIGHT HEART CATHETERIZATION WITH CORONARY ANGIOGRAM N/A 09/03/2014   Procedure: LEFT AND RIGHT HEART CATHETERIZATION WITH CORONARY ANGIOGRAM;  Surgeon: [name], MD;  Location: [facility] CATH LAB;  Service: Cardiovascular;  Laterality: N/A;  . LUMBAR LAMINECTOMY/DECOMPRESSION MICRODISCECTOMY N/A 08/18/2013   Procedure: Lumbar two-three, lumbar three-four, lumbar four-five decompressive laminectomies;  Surgeon: [name], MD;  Location: [facility]UR[facility];  Service: Neurosurgery;  Laterality: N/A;  . lumbar left arm  1961  . TONSILLECTOMY    . TOTAL KNEE ARTHROPLASTY  2005, 2007  . wrist tendon surgery      Family History  Problem Relation Age of Onset  . Other Mother 10    CABG  . Heart disease Mother   . Hypertension Mother   .  Hyperlipidemia Son     Social History   Social History  . Marital status: Married    Spouse name: N/A  . Number of children: 2  . Years of education: N/A   Social History Main Topics  . Smoking status: Never Smoker  . Smokeless tobacco: Never Used  . Alcohol use No  . Drug use: No  . Sexual activity: Not Asked   Other Topics Concern  . 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  amLODipine (NORVASC) 5 MG tablet, TAKE ONE TABLET BY MOUTH DAILY, Disp: 90 tablet, Rfl: 2 .  aspirin 81 MG tablet, Take 81 mg by mouth daily. , Disp: , Rfl:  .  B-D UF III MINI PEN NEEDLES 31G X 5 MM MISC, USE 4X A DAY AS DIRECTED, Disp: 200 each, Rfl: 4 .  doxazosin (CARDURA) 4 MG tablet, TAKE ONE TABLET BY MOUTH DAILY, Disp: 90 tablet, Rfl: 1 .  furosemide (LASIX) 40 MG tablet, Take 1 tablet (40 mg total) by mouth daily., Disp: 90 tablet, Rfl: 0 .  glucose blood (ONETOUCH VERIO) test strip, USE TO TEST BLOOD SUGAR 3 TIMES DAILY AS INSTRUCTED. DX: E11.51, Disp: 100 each, Rfl: 5 .  insulin lispro (HUMALOG KWIKPEN) 100 UNIT/ML KiwkPen, INJECT 4 TO 10 UNITS BEFORE EACH MEAL 3 TIMES A DAY, Disp: 15 pen, Rfl: 2 .  LEVEMIR FLEXTOUCH 100 UNIT/ML Pen, INJECT 20 UNITS INTO THE SKIN DAILY AT 10PM, Disp: 15 mL, Rfl: 2 .  metFORMIN (GLUCOPHAGE) 1000 MG tablet, TAKE ONE TABLET BY MOUTH TWICE A DAY WITH FOOD, Disp: 60 tablet, Rfl: 2 .  Multiple Vitamins-Minerals (CENTRUM SILVER PO), Take 1 tablet by mouth daily. , Disp: , Rfl:  .  omeprazole (PRILOSEC) 20 MG capsule, TAKE 1 CAPSULE (20 MG TOTAL) BY MOUTH DAILY., Disp: 90 capsule, Rfl: 1 .  ONETOUCH DELICA LANCETS 99991111 MISC, USE TO TEST BLOOD SUGAR 3 TIMES A DAY AS INSTRUCTED., Disp: 200 each, Rfl: 1 .  pravastatin (PRAVACHOL) 40 MG tablet, TAKE 1 TABLET (40 MG TOTAL) BY MOUTH EVERY EVENING., Disp: 30 tablet, Rfl: 11 .  Propylene Glycol (SYSTANE BALANCE) 0.6 % SOLN, Apply to eye. 2 drops each eye once daily, Disp: , Rfl:   EXAM:  Vitals:   01/26/17 0858  BP: 120/70  Pulse: 87  Temp: 98.1 F (36.7 C)    Body mass index is 31.9 kg/m.  GENERAL: vitals reviewed and listed  above, alert, oriented, appears well hydrated and in no acute distress  HEENT: atraumatic, conjunttiva clear, no obvious abnormalities on inspection of external nose and ears  NECK: no obvious masses on inspection  LUNGS: clear to auscultation bilaterally, no wheezes, rales or rhonchi, good air movement  CV: HRRR, no peripheral edema  ABD: BS+, soft, NTTP  MS: moves all extremities without noticeable abnormality  PSYCH: pleasant and cooperative, no obvious depression or anxiety  ASSESSMENT AND PLAN:  Discussed the following assessment and plan:  Diarrhea, unspecified type -stool culture and c. Diff -bland dairy free diet -imodium as needed -return and  emergency precautions if worsening or persists  Diabetic peripheral neuropathy associated with type 2 diabetes mellitus ([place]) Uncontrolled type 2 diabetes mellitus with peripheral circulatory disorder ([place]) -improving -lifestyle recs  Hyperlipidemia, unspecified hyperlipidemia type -stable, lifestyle recs -discussed benefits/risks statins per his concerns  Essential hypertension -stable  Coronary artery disease due to lipid rich plaque -sees cardiologist, no symptoms  Gastroesophageal reflux disease, esophagitis presence not specified -stable  Chronic diastolic congestive heart failure ([place]) -stable, no symptoms, sees cardiologist  OAB (overactive bladder) stable  -Patient advised to return or notify a doctor immediately if symptoms worsen or persist or new concerns arise.  Patient Instructions  BEFORE YOU LEAVE: -follow up:  1)Medicare exam with Manuela Schwartz and follow up with Dr. Maudie Mercury in 3-4 months -lab  We have ordered labs or studies at this visit. It can take up to 1-2 weeks for results and processing. IF results require follow up or explanation, we will call you with instructions. Clinically stable results will be released to your [facility]. If you have not heard from [place] or cannot find your results in [facility] in 2  weeks please contact our office at [phone].  If you are not yet signed up for [facility], please consider signing up.  Imodium if any further diary.  Bland diet and avoid all dairy for at least 1-2 weeks.  Stay hydrated.  Seek care if worsening, new symptoms or symptoms persist.        Colin Benton R., DO

## 2017-01-26 ENCOUNTER — Ambulatory Visit (INDEPENDENT_AMBULATORY_CARE_PROVIDER_SITE_OTHER): Payer: Medicare Other | Admitting: Family Medicine

## 2017-01-26 ENCOUNTER — Encounter: Payer: Self-pay | Admitting: Family Medicine

## 2017-01-26 VITALS — BP 120/70 | HR 87 | Temp 98.1°F | Ht 68.0 in | Wt 209.8 lb

## 2017-01-26 DIAGNOSIS — E1151 Type 2 diabetes mellitus with diabetic peripheral angiopathy without gangrene: Secondary | ICD-10-CM

## 2017-01-26 DIAGNOSIS — I5032 Chronic diastolic (congestive) heart failure: Secondary | ICD-10-CM

## 2017-01-26 DIAGNOSIS — N3281 Overactive bladder: Secondary | ICD-10-CM

## 2017-01-26 DIAGNOSIS — E785 Hyperlipidemia, unspecified: Secondary | ICD-10-CM | POA: Diagnosis not present

## 2017-01-26 DIAGNOSIS — E1165 Type 2 diabetes mellitus with hyperglycemia: Secondary | ICD-10-CM

## 2017-01-26 DIAGNOSIS — E1142 Type 2 diabetes mellitus with diabetic polyneuropathy: Secondary | ICD-10-CM

## 2017-01-26 DIAGNOSIS — R197 Diarrhea, unspecified: Secondary | ICD-10-CM

## 2017-01-26 DIAGNOSIS — I2510 Atherosclerotic heart disease of native coronary artery without angina pectoris: Secondary | ICD-10-CM

## 2017-01-26 DIAGNOSIS — I10 Essential (primary) hypertension: Secondary | ICD-10-CM

## 2017-01-26 DIAGNOSIS — I2583 Coronary atherosclerosis due to lipid rich plaque: Secondary | ICD-10-CM

## 2017-01-26 DIAGNOSIS — IMO0002 Reserved for concepts with insufficient information to code with codable children: Secondary | ICD-10-CM

## 2017-01-26 DIAGNOSIS — K219 Gastro-esophageal reflux disease without esophagitis: Secondary | ICD-10-CM

## 2017-01-26 NOTE — Patient Instructions (Addendum)
BEFORE YOU LEAVE: -follow up:  1)Medicare exam with Manuela Schwartz and follow up with Dr. Maudie Mercury in 3-4 months -lab  We have ordered labs or studies at this visit. It can take up to 1-2 weeks for results and processing. IF results require follow up or explanation, we will call you with instructions. Clinically stable results will be released to your [facility]. If you have not heard from [place] or cannot find your results in [facility] in 2 weeks please contact our office at [phone].  If you are not yet signed up for [facility], please consider signing up.  Imodium if any further diary.  Bland diet and avoid all dairy for at least 1-2 weeks.  Stay hydrated.  Seek care if worsening, new symptoms or symptoms persist.

## 2017-01-26 NOTE — Progress Notes (Signed)
Pre visit review using our clinic review tool, if applicable. No additional management support is needed unless otherwise documented below in the visit note. 

## 2017-01-27 LAB — CLOSTRIDIUM DIFFICILE BY PCR: CDIFFPCR: NOT DETECTED

## 2017-01-30 LAB — STOOL CULTURE

## 2017-02-04 ENCOUNTER — Other Ambulatory Visit: Payer: Self-pay | Admitting: Internal Medicine

## 2017-02-12 ENCOUNTER — Ambulatory Visit: Payer: Medicare Other

## 2017-02-15 ENCOUNTER — Telehealth: Payer: Self-pay

## 2017-02-15 ENCOUNTER — Other Ambulatory Visit: Payer: Self-pay | Admitting: Internal Medicine

## 2017-02-15 NOTE — Telephone Encounter (Signed)
Call to [name];  Stated I would like to reschedule his AWV as his last AWV was 5/17; Agreed to reschedule to 5/18 Friday at [place] changed

## 2017-02-16 ENCOUNTER — Ambulatory Visit: Payer: Medicare Other

## 2017-02-25 ENCOUNTER — Other Ambulatory Visit: Payer: Self-pay | Admitting: Cardiology

## 2017-02-28 ENCOUNTER — Other Ambulatory Visit: Payer: Self-pay | Admitting: Internal Medicine

## 2017-03-07 ENCOUNTER — Other Ambulatory Visit: Payer: Self-pay | Admitting: Family Medicine

## 2017-03-25 ENCOUNTER — Other Ambulatory Visit: Payer: Self-pay | Admitting: Cardiology

## 2017-03-28 ENCOUNTER — Other Ambulatory Visit: Payer: Self-pay | Admitting: Family Medicine

## 2017-04-07 ENCOUNTER — Other Ambulatory Visit: Payer: Self-pay | Admitting: Internal Medicine

## 2017-04-07 ENCOUNTER — Other Ambulatory Visit: Payer: Self-pay | Admitting: Pediatrics

## 2017-04-09 ENCOUNTER — Other Ambulatory Visit: Payer: Self-pay | Admitting: Cardiology

## 2017-04-09 ENCOUNTER — Other Ambulatory Visit: Payer: Self-pay | Admitting: Pediatrics

## 2017-04-09 NOTE — Telephone Encounter (Signed)
REFILL 

## 2017-04-18 ENCOUNTER — Encounter: Payer: Self-pay | Admitting: Internal Medicine

## 2017-04-18 ENCOUNTER — Ambulatory Visit (INDEPENDENT_AMBULATORY_CARE_PROVIDER_SITE_OTHER): Payer: Medicare Other | Admitting: Internal Medicine

## 2017-04-18 VITALS — BP 120/62 | HR 64 | Wt 208.0 lb

## 2017-04-18 DIAGNOSIS — E1165 Type 2 diabetes mellitus with hyperglycemia: Secondary | ICD-10-CM | POA: Diagnosis not present

## 2017-04-18 DIAGNOSIS — IMO0002 Reserved for concepts with insufficient information to code with codable children: Secondary | ICD-10-CM

## 2017-04-18 DIAGNOSIS — E1151 Type 2 diabetes mellitus with diabetic peripheral angiopathy without gangrene: Secondary | ICD-10-CM

## 2017-04-18 LAB — POCT GLYCOSYLATED HEMOGLOBIN (HGB A1C): HEMOGLOBIN A1C: 7

## 2017-04-18 NOTE — Progress Notes (Signed)
Subjective:     Patient ID: [name], male   DOB: 01-Jul-1933, 83 y.o.   MRN: [id number]  HPI [name] is a very pleasant 83 y.o. man returning for management of DM2, dx 07/2012, uncontrolled, insulin-dependent, with complications (CAD, peripheral neuropathy, PAD). Last visit 3 mo ago.  Still using a walker after his fall in 09/2015 >> still weakness in legs and dysequilibrium. He had PT ut he feels this did not help.   Last HbA1C was: Lab Results  Component Value Date   HGBA1C 6.7 01/17/2017   HGBA1C 6.8 10/17/2016   HGBA1C 7.6 07/17/2016   He is on: - Metformin 1000 mg 2x a day - Levemir 20 >> 22 >> 20 units at bedtime - Humalog 15 min before a meal:  4 units before a small meal (use 4 units before b'fast) 6 units before a regular meal 8-10 units before a large meal Please subtract 2 units if you plan to be active after a meal.  Pt again brings a great log which we reviewed together >> sugars are at goal: - am: 75 (if no bedtime snack)-145, 161 >> 109-156 >> 88-126 >> 97-131 >> 74, 80-120 - 2h after b'fast: 136-162 >> 61, 110-170, 180 >> 125-168, 210 >> 84-153 >> 91-138 >> 120-160 - before lunch:  91-150, 177 >> 98-155, 168 >> 72-117, 150 >> 75-116, 132 >> 91-125 - 2h after lunch: 140-183 >> 123-180 >> 105-170 >> 155-185 >> 80-150 >> 102-148 >> 97-125 - dinner: 78, 88-175 >> 128-165, 171 >> 86, 90-137 >> 70-152 >> 81-140, 160 x1 - 2h after dinner: 140-160, 180 >> 135, 149-165, 185 >> 131-171 >> 80-153 >> 109-145 >> 119-150 - bedtime:  130-175 >> 125-170, 216 >> 108-174 >> 89-154, 160 >> 91-140, 150 >> 88, 110-132 - nighttime: 115-160 >> 90-133 Has hypoglycemia awareness at 70.  - no CKD. Last BUN/Cr: Lab Results  Component Value Date   BUN 16 10/27/2016   CREATININE 0.92 10/27/2016  Not on an ACEI. - last lipid panel: Lab Results  Component Value Date   CHOL 97 04/20/2016   HDL 35.40 (L) 04/20/2016   LDLCALC 48 04/20/2016   TRIG 70.0 04/20/2016   CHOLHDL 3  04/20/2016  Off Pravastatin. - He has peripheral neuropathy. He is taking a B complex vitamin, and has a neuropathy cream. Seeing [name] at the [facility].  - Last eye exam 10/2016: No DR (Dr. Alois Cliche). Had Cataract Sx in 08/2015. Has macular degeneration >> on Preservision Areds vitamins. - On ASA 81.  He had a heart catheterization on 09/03/2014 >> 2 small Blockages >> no intervention other than statins. 2D ECHO with normal EF. In 08/2015 he was hospitalized for ?sepsis with strep pneumoniae after acquiring left lower lobe pneumonia.    Review of Systems Constitutional: no weight gain/no weight loss, no fatigue, no subjective hyperthermia, no subjective hypothermia Eyes: no blurry vision, no xerophthalmia ENT: no sore throat, no nodules palpated in throat, no dysphagia, no odynophagia, no hoarseness Cardiovascular: no CP/no SOB/no palpitations/no leg swelling Respiratory: no cough/no SOB/no wheezing Gastrointestinal: no N/no V/no D/no C/no acid reflux Musculoskeletal: no muscle aches/no joint aches Skin: no rashes, no hair loss Neurological: no tremors/no numbness/no tingling/no dizziness, + weakness  I reviewed pt's medications, allergies, PMH, social hx, family hx, and changes were documented in the history of present illness. Otherwise, unchanged from my initial visit note.  Objective:   Physical Exam BP 120/62 (BP Location: Left Arm, Patient Position: Sitting)  Pulse 64   Wt 208 lb (94.3 kg)   SpO2 95%   BMI 31.63 kg/m  Body mass index is 31.63 kg/m.  Wt Readings from Last 3 Encounters:  04/18/17 208 lb (94.3 kg)  01/26/17 209 lb 12.8 oz (95.2 kg)  01/17/17 210 lb (95.3 kg)   Constitutional: overweight, in NAD Eyes: PERRLA, EOMI, no exophthalmos ENT: moist mucous membranes, no thyromegaly, no cervical lymphadenopathy Cardiovascular: RRR, + 1/6 SEM, no RG Respiratory: CTA B Gastrointestinal: abdomen soft, NT, ND, BS+ Musculoskeletal: no deformities, strength  intact in all 4 Skin: moist, warm, no rashes Neurological: no tremor with outstretched hands, DTR normal in all 4   Assessment:     1. DM2, insulin-dependent, uncontrolled, with complications; target G5O for him: 7-7.5% - CAD - peripheral neuropathy - PAD Dr. [name]  Plan:     1. Pt with long standing Diabetes mellitus type 2, with improved control in last year. His sugars are almost all at goal, after we decrease Humalog dose at last visit. No changes needed. - I advised him to: Patient Instructions  Please continue: - Metformin 1000 mg 2x a day - Levemir 20 units at bedtime. - Humalog 15 min before a meal:  4 units before a small meal (use 4 units before b'fast) 6 units before a regular meal 8-10 units before a large meal Please subtract 2 units if you plan to be active after a meal.  Please come back for a follow-up appointment in 4 months.  - checked HbA1c today >> 7% (slightly higher), however, it should be lower per review of his log. Will not check a fructosamine since the HbA1c is still at goal for him. - continue checking sugars at different times of the day - check 1x a day, rotating checks - advised for yearly eye exams >> he is UTD - advised for flu shot >> he is UTD - Return to clinic in 4 mo with sugar log   Philemon Kingdom, MD PhD [facility] Endocrinology

## 2017-04-18 NOTE — Addendum Note (Signed)
Addended by: Caprice Beaver T on: 04/18/2017 09:37 AM   Modules accepted: Orders

## 2017-04-18 NOTE — Patient Instructions (Addendum)
Please continue: - Metformin 1000 mg 2x a day - Levemir 20 units at bedtime. - Humalog 15 min before a meal:  4 units before a small meal (use 4 units before b'fast) 6 units before a regular meal 8-10 units before a large meal Please subtract 2 units if you plan to be active after a meal.  Please come back for a follow-up appointment in 4 months. 

## 2017-04-20 ENCOUNTER — Other Ambulatory Visit: Payer: Self-pay | Admitting: Internal Medicine

## 2017-04-26 NOTE — Progress Notes (Signed)
Subjective:   [name] is a 83 y.o. male who presents for Medicare Annual/Subsequent preventive examination.  The Patient was informed that the wellness visit is to identify future health risk and educate and initiate measures that can reduce risk for increased disease through the lifespan.    NO ROS; Medicare Wellness Visit 1st AWV 04/20/16  Psychosocial  Dtr lives in [place] [name] and son lives here  One grand and 2 great grands [name] having more difficulty walking; has a walker at home but had a cane today He can get out and mow  Has a lift chair; goes to restaurants and needs a cart if out Given a d/a site to search for used pow etc.   Pain can be 6 or 7 OA  DM states it is better 2 weeks ago it was 6.7 (it was 9)  Doing very well; was having some lows at lunch and dinner and the endocrinologist changed his insulin   Describes health as good, fair or great? Fair   Preventive Screening -Counseling & Management  Colonoscopy aged out  Smoking history - never used  Second Hand Smoke status; No Smokers in the home ETOH - no  Medication adherence or issues? 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 Can get up on his on, but needs something to support his efforts   Given education on "Fall Prevention in the Home" for more safety tips the patient can apply as appropriate.  Long term goal is to "age in place" or undecided   Mobility of Functional changes this year? Neuropathy is worse Could had sensation in feet but minimal in the toes or ball of the foot Legs are worse; 3 falls in the last year  lives one level home Has bars in shower Safety; community, yes  wears sunscreen,  Wears a hat; will be buying sunscreen  safe place for firearms; does not have any  Motor vehicle accidents; no   Mental Health:  Any emotional problems? Anxious, depressed, irritable, sad or blue? No   Denies feeling depressed or hopeless; voices pleasure in daily life How many social activities have you been engaged in within the last 2 weeks? 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 Has difficulty if directions to the same place changes and he did know all the streets in [place]  MMSE 28/30;    Patient Care Team: [name] as PCP - General (Family Medicine) [name] as Attending Physician (Cardiology) [name] as Consulting Physician (Endocrinology) Bobby Rainier [facility] [name] Bobby Bors, [name] as Consulting Physician (Cardiology) [name]; for feet; goes every 45 days; goes back in 3 weeks    Immunization History  Administered Date(s) Administered  . Influenza Split 10/06/2011, 09/13/2012  .  Influenza Whole 09/11/2007, 09/22/2008, 09/13/2009, 09/26/2010  . Influenza, High Dose Seasonal PF 09/10/2015, 09/01/2016  . Influenza,inj,Quad PF,36+ Mos 08/13/2013, 08/21/2014  . Pneumococcal Conjugate-13 11/23/2014  . Pneumococcal Polysaccharide-23 12/11/1997, 09/13/2009  . Td 12/24/2007  . Tdap 08/25/2014  . Zoster 01/03/2007   Required Immunizations needed today  Screening test up to date or reviewed for plan of completion Health Maintenance Due  Topic Date Due  . FOOT EXAM  04/20/2017   Diabetic Foot Exam - Simple   Simple Foot Form Diabetic Foot exam was performed with the following findings:  Yes 04/27/2017 10:00 AM  Visual Inspection No deformities, no ulcerations, no other skin breakdown bilaterally:  Yes Sensation Testing Intact to touch and monofilament testing bilaterally:  Yes Pulse Check See comments:  Yes Comments Pulses minimal; feet warm and dry, color good, no callus or other issues Socks and shoes appropriate. Checks feet every day Goes to foot doctor q 3 to 4 weeks to check feet and cut toenails  Good foot hygiene    Sensation was less in toes and ball of his foot bilaterally  Cardiac Risk Factors include: advanced age (>14men, >52 women);diabetes mellitus;family history of premature cardiovascular disease;hypertension;male gender;sedentary lifestyle     Objective:    Vitals: BP 110/60   Pulse 64   Ht 5\' 10"  (1.778 m)   Wt 207 lb 4 oz (94 kg)   SpO2 95%   BMI 29.74 kg/m   Body mass index is 29.74 kg/m.  Tobacco History  Smoking Status  . Never Smoker  Smokeless Tobacco  . Never Used     Counseling given: Yes   Past Medical History:  Diagnosis Date  . Anemia   . Atherosclerotic PVD with intermittent claudication ([place])   . CAD (coronary artery disease)   . Diabetes mellitus    type 2 with peripheral neuropathy  . Diastolic CHF, chronic ([place])   . Diverticulosis   . DVT (deep venous thrombosis) ([place])   . Dyslipidemia   . Edema  08/19/2014  . GERD (gastroesophageal reflux disease)    hx hiatal hernia  . HTN (hypertension)   . IBS (irritable bowel syndrome)    Hx: of  . Osteoarthritis   . Overactive bladder    Hx: of stress incontinence  . Pneumonia 2002   history of  . Shingles   . Varicose veins of lower extremities with other complications 3/87/5643   Past Surgical History:  Procedure Laterality Date  . BACK SURGERY    . CATARACT EXTRACTION Bilateral 08/10/2015,08/15/2015  . COLONOSCOPY     Hx: of  . CTS bilateral    . KNEE ARTHROSCOPY  1988, 1992   bilateral  . LAMINECTOMY  08/18/2013   DECOMPRESSIVE LAMINECTOMIES  . LEFT AND  RIGHT HEART CATHETERIZATION WITH CORONARY ANGIOGRAM N/A 09/03/2014   Procedure: LEFT AND RIGHT HEART CATHETERIZATION WITH CORONARY ANGIOGRAM;  Surgeon: [name], [name];  Location[facility] CATH LAB;  Service: Cardiovascular;  Laterality: N/A;  . LUMBAR LAMINECTOMY/DECOMPRESSION MICRODISCECTOMY N/A 08/18/2013   Procedure: Lumbar two-three, lumbar three-four, lumbar four-five decompressive laminectomies;  Surgeon: [name], [name];  Location: [facility] NEURO ORS;  Service: Neurosurgery;  Laterality: N/A;  . lumbar left arm  1961  . TONSILLECTOMY    . TOTAL KNEE ARTHROPLASTY  2005, 2007  . wrist tendon surgery     Family History  Problem Relation Age of Onset  . Other Mother 24       CABG  . Heart disease Mother   . Hypertension Mother   . Hyperlipidemia Son    History  Sexual Activity  . Sexual activity: Not on file    Outpatient Encounter Prescriptions as of 04/27/2017  Medication Sig  . amLODipine (NORVASC) 5 MG tablet TAKE ONE TABLET BY MOUTH DAILY  . aspirin 81 MG tablet Take 81 mg by mouth daily.   . B-D UF III MINI PEN NEEDLES 31G X 5 MM MISC USE FOUR TIMES A DAY AS DIRECTED  . doxazosin (CARDURA) 4 MG tablet TAKE ONE TABLET BY MOUTH DAILY  . furosemide (LASIX) 40 MG tablet TAKE ONE TABLET BY MOUTH DAILY  . insulin lispro (HUMALOG KWIKPEN) 100 UNIT/ML KiwkPen INJECT 4 TO  10 UNITS BEFORE EACH MEAL 3 TIMES A DAY  . LEVEMIR FLEXTOUCH 100 UNIT/ML Pen INJECT 20 UNITS INTO THE SKIN DAILY AT 10PM  . metFORMIN (GLUCOPHAGE) 1000 MG tablet TAKE ONE TABLET BY MOUTH TWICE A DAY WITH FOOD  . Multiple Vitamins-Minerals (CENTRUM SILVER PO) Take 1 tablet by mouth daily.   . NON FORMULARY   . omeprazole (PRILOSEC) 20 MG capsule TAKE 1 CAPSULE (20 MG TOTAL) BY MOUTH DAILY.  Marland Kitchen ONETOUCH DELICA LANCETS 34H MISC USE TO TEST BLOOD SUGAR 3 TIMES A DAY AS INSTRUCTED  . ONETOUCH DELICA LANCETS 96Q MISC USE TO TEST BLOOD SUGAR 3 TIMES A DAY AS INSTRUCTED  . ONETOUCH VERIO test strip USE TO TEST BLOOD SUGAR 3 TIMES A DAY AS DIRECTED  . OVER THE COUNTER MEDICATION   . pravastatin (PRAVACHOL) 40 MG tablet Take 1 tablet (40 mg total) by mouth daily. NEED OV.  Marland Kitchen Propylene Glycol (SYSTANE BALANCE) 0.6 % SOLN Apply to eye. 2 drops each eye once daily  . [DISCONTINUED] metFORMIN (GLUCOPHAGE) 1000 MG tablet TAKE ONE TABLET BY MOUTH TWICE A DAY WITH FOOD   No facility-administered encounter medications on file as of 04/27/2017.     Activities of Daily Living In your present state of health, [name] you have any difficulty performing the following activities: 04/27/2017  Hearing? N  Vision? N  Difficulty concentrating or making decisions? N  Walking or climbing stairs? N  Dressing or bathing? Y  Doing errands, shopping? N  Preparing Food and eating ? N  Using the Toilet? N  In the past six months, have you accidently leaked urine? N  [name] you have problems with loss of bowel control? N  Managing your Medications? N  Managing your Finances? N  Housekeeping or managing your Housekeeping? N  Some recent data might be hidden    Patient Care Team: [name] as PCP - General (Family Medicine) [name] as Attending Physician (Cardiology) [name] as Consulting Physician (Endocrinology) Bobby Rainier [facility]) Lelon Perla, [name] as  Consulting Physician  (Cardiology)   Assessment:     Exercise Activities and Dietary recommendations Current Exercise Habits: Structured exercise class, Type of exercise: strength training/weights, Time (Minutes): 45, Frequency (Times/Week): 2, Weekly Exercise (Minutes/Week): 90, Intensity: Moderate  Goals    . Exercise 150 minutes per week (moderate activity)          Will try to go to the silver sneaker class with your wife       Fall Risk Fall Risk  04/27/2017 04/20/2016 03/26/2015 03/26/2015 02/13/2014  Falls in the past year? Yes No Yes Yes Yes  Number falls in past yr: 2 or more - 1 1 2  or more  Injury with Fall? - - No Yes -  Risk Factor Category  High Fall Risk - - - -  Risk for fall due to : History of fall(s);Impaired mobility - - - -  Follow up Education provided - Education provided - -   Depression Screen PHQ 2/9 Scores 04/27/2017 04/20/2016 03/26/2015 03/26/2015  PHQ - 2 Score 0 0 0 0    Cognitive Function MMSE - Mini Mental State Exam 04/27/2017  Orientation to time 5  Orientation to Place 5  Registration 3  Attention/ Calculation 4  Recall 2  Language- name 2 objects 2  Language- repeat 1  Language- follow 3 step command 3  Language- read & follow direction 1  Write a sentence 1  Copy design 1  Total score 28    recall 2/3;     Immunization History  Administered Date(s) Administered  . Influenza Split 10/06/2011, 09/13/2012  . Influenza Whole 09/11/2007, 09/22/2008, 09/13/2009, 09/26/2010  . Influenza, High Dose Seasonal PF 09/10/2015, 09/01/2016  . Influenza,inj,Quad PF,36+ Mos 08/13/2013, 08/21/2014  . Pneumococcal Conjugate-13 11/23/2014  . Pneumococcal Polysaccharide-23 12/11/1997, 09/13/2009  . Td 12/24/2007  . Tdap 08/25/2014  . Zoster 01/03/2007   Screening Tests Health Maintenance  Topic Date Due  . FOOT EXAM  04/20/2017  . INFLUENZA VACCINE  07/11/2017  . OPHTHALMOLOGY EXAM  10/16/2017  . HEMOGLOBIN A1C  10/19/2017  . TETANUS/TDAP  08/25/2024  . PNA vac Low  Risk Adult  Completed      Plan:    PCP Notes  Health Maintenance Foot exam completed but seeing foot doctor routinely q 3 to 4 weeks  Abnormal Screens  MMSE 28/30  Referrals none  Patient concerns; falls Recommended the silver sneaker program x 2 per week to assist with tone and balance   Nurse Concerns; walking with a cane declines w/c but does use a walker at home  (wife was given a web site of online DME on sale by prior owners)   Next PCP apt 05/03/2017   I have personally reviewed and noted the following in the patient's chart:   . Medical and social history . Use of alcohol, tobacco or illicit drugs  . Current medications and supplements . Functional ability and status . Nutritional status . Physical activity . Advanced directives . List of other physicians . Hospitalizations, surgeries, and ER visits in previous 12 months . Vitals . Screenings to include cognitive, depression, and falls . Referrals and appointments  In addition, I have reviewed and discussed with patient certain preventive protocols, quality metrics, and best practice recommendations. A written personalized care plan for preventive services as well as general preventive health recommendations were provided to patient.     Wynetta Fines, RN  04/27/2017

## 2017-04-27 ENCOUNTER — Ambulatory Visit (INDEPENDENT_AMBULATORY_CARE_PROVIDER_SITE_OTHER): Payer: Medicare Other

## 2017-04-27 VITALS — BP 110/60 | HR 64 | Ht 70.0 in | Wt 207.2 lb

## 2017-04-27 DIAGNOSIS — Z0000 Encounter for general adult medical examination without abnormal findings: Secondary | ICD-10-CM | POA: Diagnosis not present

## 2017-04-27 NOTE — Progress Notes (Signed)
Taje Littler R., DO  

## 2017-04-27 NOTE — Patient Instructions (Addendum)
[name] , Thank you for taking time to come for your Medicare Wellness Visit. I appreciate your ongoing commitment to your health goals. Please review the following plan we discussed and let me know if I can assist you in the future.   Continue doing brain stimulating activities (puzzles, reading, adult coloring books, staying active) to keep memory sharp.   Prevention of falls: Remove rugs or any tripping hazards in the home Use Non slip mats in bathtubs and showers Placing grab bars next to the toilet and or shower Placing handrails on both sides of the stair way Adding extra lighting in the home.   Personal safety issues reviewed:  1. Consider starting a community watch program per [employer] [facility] 2.  Changes batteries is smoke detector and/or carbon monoxide detector  3.  If you have firearms; keep them in a safe place 4.  Wear protection when in the sun; Always wear sunscreen or a hat; It is good to have your doctor check your skin annually or review any new areas of concern 5. Driving safety; Keep in the right lane; stay 3 car lengths behind the car in front of you on the highway; look 3 times prior to pulling out; carry your cell phone everywhere you go!    Learn about the Yellow Dot program:  The program allows first responders at your emergency to have access to who your physician is, as well as your medications and medical conditions.  Citizens requesting the Yellow Dot Packages should contact Master Corporal Nunzio Cobbs at the [facility] [phone] for the first week of the program and beginning the week after Easter citizens should contact their Scientist, physiological.      These are the goals we discussed: Goals    . Exercise 150 minutes per week (moderate activity)          Will try to go to the silver sneaker class with your wife        This is a list of the screening recommended for you and due dates:  Health  Maintenance  Topic Date Due  . Complete foot exam   05/11/2017*  . Flu Shot  07/11/2017  . Eye exam for diabetics  10/16/2017  . Hemoglobin A1C  10/19/2017  . Tetanus Vaccine  08/25/2024  . Pneumonia vaccines  Completed  *Topic was postponed. The date shown is not the original due date.        Fall Prevention in the Home Falls can cause injuries. They can happen to people of all ages. There are many things you can do to make your home safe and to help prevent falls. What can I do on the outside of my home?  Regularly fix the edges of walkways and driveways and fix any cracks.  Remove anything that might make you trip as you walk through a door, such as a raised step or threshold.  Trim any bushes or trees on the path to your home.  Use bright outdoor lighting.  Clear any walking paths of anything that might make someone trip, such as rocks or tools.  Regularly check to see if handrails are loose or broken. Make sure that both sides of any steps have handrails.  Any raised decks and porches should have guardrails on the edges.  Have any leaves, snow, or ice cleared regularly.  Use sand or salt on walking paths during winter.  Clean up any spills in your garage right away. This  includes oil or grease spills. What can I do in the bathroom?  Use night lights.  Install grab bars by the toilet and in the tub and shower. Do not use towel bars as grab bars.  Use non-skid mats or decals in the tub or shower.  If you need to sit down in the shower, use a plastic, non-slip stool.  Keep the floor dry. Clean up any water that spills on the floor as soon as it happens.  Remove soap buildup in the tub or shower regularly.  Attach bath mats securely with double-sided non-slip rug tape.  Do not have throw rugs and other things on the floor that can make you trip. What can I do in the bedroom?  Use night lights.  Make sure that you have a light by your bed that is easy to  reach.  Do not use any sheets or blankets that are too big for your bed. They should not hang down onto the floor.  Have a firm chair that has side arms. You can use this for support while you get dressed.  Do not have throw rugs and other things on the floor that can make you trip. What can I do in the kitchen?  Clean up any spills right away.  Avoid walking on wet floors.  Keep items that you use a lot in easy-to-reach places.  If you need to reach something above you, use a strong step stool that has a grab bar.  Keep electrical cords out of the way.  Do not use floor polish or wax that makes floors slippery. If you must use wax, use non-skid floor wax.  Do not have throw rugs and other things on the floor that can make you trip. What can I do with my stairs?  Do not leave any items on the stairs.  Make sure that there are handrails on both sides of the stairs and use them. Fix handrails that are broken or loose. Make sure that handrails are as long as the stairways.  Check any carpeting to make sure that it is firmly attached to the stairs. Fix any carpet that is loose or worn.  Avoid having throw rugs at the top or bottom of the stairs. If you do have throw rugs, attach them to the floor with carpet tape.  Make sure that you have a light switch at the top of the stairs and the bottom of the stairs. If you do not have them, ask someone to add them for you. What else can I do to help prevent falls?  Wear shoes that:  Do not have high heels.  Have rubber bottoms.  Are comfortable and fit you well.  Are closed at the toe. Do not wear sandals.  If you use a stepladder:  Make sure that it is fully opened. Do not climb a closed stepladder.  Make sure that both sides of the stepladder are locked into place.  Ask someone to hold it for you, if possible.  Clearly mark and make sure that you can see:  Any grab bars or handrails.  First and last steps.  Where the  edge of each step is.  Use tools that help you move around (mobility aids) if they are needed. These include:  Canes.  Walkers.  Scooters.  Crutches.  Turn on the lights when you go into a dark area. Replace any light bulbs as soon as they burn out.  Set up your furniture so  you have a clear path. Avoid moving your furniture around.  If any of your floors are uneven, fix them.  If there are any pets around you, be aware of where they are.  Review your medicines with your doctor. Some medicines can make you feel dizzy. This can increase your chance of falling. Ask your doctor what other things that you can do to help prevent falls. This information is not intended to replace advice given to you by your health care provider. Make sure you discuss any questions you have with your health care provider. Document Released: 09/23/2009 Document Revised: 05/04/2016 Document Reviewed: 01/01/2015 Elsevier Interactive Patient Education  2017 [place] Maintenance, Male A healthy lifestyle and preventive care is important for your health and wellness. Ask your health care provider about what schedule of regular examinations is right for you. What should I know about weight and diet?  Eat a Healthy Diet  Eat plenty of vegetables, fruits, whole grains, low-fat dairy products, and lean protein.  Do not eat a lot of foods high in solid fats, added sugars, or salt. Maintain a Healthy Weight  Regular exercise can help you achieve or maintain a healthy weight. You should:  Do at least 150 minutes of exercise each week. The exercise should increase your heart rate and make you sweat (moderate-intensity exercise).  Do strength-training exercises at least twice a week. Watch Your Levels of Cholesterol and Blood Lipids  Have your blood tested for lipids and cholesterol every 5 years starting at 81 years of age. If you are at high risk for heart disease, you should start having your  blood tested when you are 81 years old. You may need to have your cholesterol levels checked more often if:  Your lipid or cholesterol levels are high.  You are older than 81 years of age.  You are at high risk for heart disease. What should I know about cancer screening? Many types of cancers can be detected early and may often be prevented. Lung Cancer  You should be screened every year for lung cancer if:  You are a current smoker who has smoked for at least 30 years.  You are a former smoker who has quit within the past 15 years.  Talk to your health care provider about your screening options, when you should start screening, and how often you should be screened. Colorectal Cancer  Routine colorectal cancer screening usually begins at 81 years of age and should be repeated every 5-10 years until you are 80 years old. You may need to be screened more often if early forms of precancerous polyps or small growths are found. Your health care provider may recommend screening at an earlier age if you have risk factors for colon cancer.  Your health care provider may recommend using home test kits to check for hidden blood in the stool.  A small camera at the end of a tube can be used to examine your colon (sigmoidoscopy or colonoscopy). This checks for the earliest forms of colorectal cancer. Prostate and Testicular Cancer  Depending on your age and overall health, your health care provider may do certain tests to screen for prostate and testicular cancer.  Talk to your health care provider about any symptoms or concerns you have about testicular or prostate cancer. Skin Cancer  Check your skin from head to toe regularly.  Tell your health care provider about any new moles or changes in moles, especially if:  There  is a change in a mole's size, shape, or color.  You have a mole that is larger than a pencil eraser.  Always use sunscreen. Apply sunscreen liberally and repeat  throughout the day.  Protect yourself by wearing long sleeves, pants, a wide-brimmed hat, and sunglasses when outside. What should I know about heart disease, diabetes, and high blood pressure?  If you are 60-75 years of age, have your blood pressure checked every 3-5 years. If you are 55 years of age or older, have your blood pressure checked every year. You should have your blood pressure measured twice-once when you are at a hospital or clinic, and once when you are not at a hospital or clinic. Record the average of the two measurements. To check your blood pressure when you are not at a hospital or clinic, you can use:  An automated blood pressure machine at a pharmacy.  A home blood pressure monitor.  Talk to your health care provider about your target blood pressure.  If you are between 62-48 years old, ask your health care provider if you should take aspirin to prevent heart disease.  Have regular diabetes screenings by checking your fasting blood sugar level.  If you are at a normal weight and have a low risk for diabetes, have this test once every three years after the age of 42.  If you are overweight and have a high risk for diabetes, consider being tested at a younger age or more often.  A one-time screening for abdominal aortic aneurysm (AAA) by ultrasound is recommended for men aged 5-75 years who are current or former smokers. What should I know about preventing infection? Hepatitis B  If you have a higher risk for hepatitis B, you should be screened for this virus. Talk with your health care provider to find out if you are at risk for hepatitis B infection. Hepatitis C  Blood testing is recommended for:  Everyone born from [birth year] through 1963.  Anyone with known risk factors for hepatitis C. Sexually Transmitted Diseases (STDs)  You should be screened each year for STDs including gonorrhea and chlamydia if:  You are sexually active and are younger than 81 years of  age.  You are older than 81 years of age and your health care provider tells you that you are at risk for this type of infection.  Your sexual activity has changed since you were last screened and you are at an increased risk for chlamydia or gonorrhea. Ask your health care provider if you are at risk.  Talk with your health care provider about whether you are at high risk of being infected with HIV. Your health care provider may recommend a prescription medicine to help prevent HIV infection. What else can I do?  Schedule regular health, dental, and eye exams.  Stay current with your vaccines (immunizations).  Do not use any tobacco products, such as cigarettes, chewing tobacco, and e-cigarettes. If you need help quitting, ask your health care provider.  Limit alcohol intake to no more than 2 drinks per day. One drink equals 12 ounces of beer, 5 ounces of wine, or 1 ounces of hard liquor.  Do not use street drugs.  Do not share needles.  Ask your health care provider for help if you need support or information about quitting drugs.  Tell your health care provider if you often feel depressed.  Tell your health care provider if you have ever been abused or do not feel safe at  home. This information is not intended to replace advice given to you by your health care provider. Make sure you discuss any questions you have with your health care provider. Document Released: 05/25/2008 Document Revised: 07/26/2016 Document Reviewed: 08/31/2015 Elsevier Interactive Patient Education  2017 [employer].

## 2017-05-02 NOTE — Progress Notes (Signed)
HPI:  AWV 04/2017  [name] is a pleasant 83 y.o. here for follow up. Chronic medical problems summarized below were reviewed for changes and stability and were updated as needed below. These issues and their treatment remain stable for the most part. Doing well. Reports he is getting some activity with mowing his grass, but no regular exercise. [place] but has questions about what types of activities would be best. Denies CP, dizziness, hypoglycemia, falls recently, SOB, DOE, treatment intolerance or new symptoms. Due for labs.  Diabetes - Last X3A 3/55 7.0 -complications: peripheral neuropathy -managed by endocrinology  -meds: metformin,Levimir, insulin with meals, asa, statin -eye exam: sees Dr. Idolina Primer -foot exam: utd  Essential hypertension - stable  -meds: norvasc 5, doxasosin 4, lasix 40,   CAD, Chronic diastolic congestive heart failure - 2-D echo on 08/26/14 showed EF 60-65% with grade 1 diastolic dysfunction.  -cath 2015 30% LM, 40-50%LAD  -meds: asa, statin, lasix  -cardiologist: Dr. [name]  OAB (overactive bladder) -meds: Cardura, myrbetriq, vesicare -stable  -Sees [facility]ist, Dr. Patsy [place]  GERD: -meds: prilosec  Hyperlipemia - stable  - meds: Pravastatin   Anemia: -during hospital stay 2016 and mild in the past on review cardiology records  -no bleeding     ROS: See pertinent positives and negatives per HPI.  Past Medical History:  Diagnosis Date  . Anemia   . Atherosclerotic PVD with intermittent claudication ([place])   . CAD (coronary artery disease)   . Diabetes mellitus    type 2 with peripheral neuropathy  . Diastolic CHF, chronic ([place])   . Diverticulosis   . DVT (deep venous thrombosis) ([place])   . Dyslipidemia   . Edema 08/19/2014  . GERD (gastroesophageal reflux disease)    hx hiatal hernia  . HTN (hypertension)   . IBS (irritable bowel syndrome)    Hx: of  .  Osteoarthritis   . Overactive bladder    Hx: of stress incontinence  . Pneumonia 2002   history of  . Shingles   . Varicose veins of lower extremities with other complications 7/32/2025    Past Surgical History:  Procedure Laterality Date  . BACK SURGERY    . CATARACT EXTRACTION Bilateral 08/10/2015,08/15/2015  . COLONOSCOPY     Hx: of  . CTS bilateral    . KNEE ARTHROSCOPY  1988, 1992   bilateral  . LAMINECTOMY  08/18/2013   DECOMPRESSIVE LAMINECTOMIES  . LEFT AND RIGHT HEART CATHETERIZATION WITH CORONARY ANGIOGRAM N/A 09/03/2014   Procedure: LEFT AND RIGHT HEART CATHETERIZATION WITH CORONARY ANGIOGRAM;  Surgeon: [name], MD;  Location: [facility] CATH LAB;  Service: Cardiovascular;  Laterality: N/A;  . LUMBAR LAMINECTOMY/DECOMPRESSION MICRODISCECTOMY N/A 08/18/2013   Procedure: Lumbar two-three, lumbar three-four, lumbar four-five decompressive laminectomies;  Surgeon: [name], MD;  Location: [place] NEURO ORS;  Service: Neurosurgery;  Laterality: N/A;  . lumbar left arm  1961  . TONSILLECTOMY    . TOTAL KNEE ARTHROPLASTY  2005, 2007  . wrist tendon surgery      Family History  Problem Relation Age of Onset  . Other Mother 43       CABG  . Heart disease Mother   . Hypertension Mother   . Hyperlipidemia Son     Social History   Social History  . Marital status: Married    Spouse name: N/A  . Number of children: 2  . Years of education: N/A   Social  History Main Topics  . Smoking status: Never Smoker  . Smokeless tobacco: Never Used  . Alcohol use No  . Drug use: No  . Sexual activity: Not Asked   Other Topics Concern  . 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  amLODipine (NORVASC) 5 MG tablet, TAKE ONE TABLET BY MOUTH DAILY, Disp: 90 tablet, Rfl: 2 .  aspirin 81 MG tablet, Take 81 mg by mouth daily. , Disp: , Rfl:  .  B-D UF III MINI PEN NEEDLES 31G X 5 MM MISC, USE FOUR TIMES A DAY AS DIRECTED, Disp: 200 each, Rfl: 3 .  doxazosin (CARDURA) 4 MG tablet, TAKE ONE TABLET BY MOUTH DAILY, Disp: 90 tablet, Rfl: 2 .  furosemide (LASIX) 40 MG tablet, TAKE ONE TABLET BY MOUTH DAILY, Disp: 90 tablet, Rfl: 0 .  insulin lispro (HUMALOG KWIKPEN) 100 UNIT/ML KiwkPen, INJECT 4 TO 10 UNITS BEFORE EACH MEAL 3 TIMES A DAY, Disp: 15 pen, Rfl: 2 .  LEVEMIR FLEXTOUCH 100 UNIT/ML Pen, INJECT 20 UNITS INTO THE SKIN DAILY AT 10PM, Disp: 15 mL, Rfl: 2 .  metFORMIN (GLUCOPHAGE) 1000 MG tablet, TAKE ONE TABLET BY MOUTH TWICE A DAY WITH FOOD, Disp: 60 tablet, Rfl: 1 .  Multiple Vitamins-Minerals (CENTRUM SILVER PO), Take 1 tablet by mouth daily. , Disp: , Rfl:  .  NON FORMULARY, , Disp: , Rfl:  .  omeprazole (PRILOSEC) 20 MG capsule, TAKE 1 CAPSULE (20 MG TOTAL) BY MOUTH DAILY., Disp: 90 capsule, Rfl: 1 .  ONETOUCH DELICA LANCETS 66A MISC, USE TO TEST BLOOD SUGAR 3 TIMES A DAY AS INSTRUCTED, Disp: 200 each, Rfl: 0 .  ONETOUCH DELICA LANCETS 63K MISC, USE TO TEST BLOOD SUGAR 3 TIMES A DAY AS INSTRUCTED, Disp: 200 each, Rfl: 0 .  ONETOUCH VERIO test strip, USE TO TEST BLOOD SUGAR 3 TIMES A DAY AS DIRECTED, Disp: 100 each, Rfl: 5 .  OVER THE COUNTER MEDICATION, , Disp: , Rfl:  .  pravastatin (PRAVACHOL) 40 MG tablet, Take 1 tablet (40 mg total) by mouth daily. NEED OV., Disp: 30 tablet, Rfl: 0 .  Propylene Glycol (SYSTANE BALANCE) 0.6 % SOLN, Apply to eye. 2 drops each eye once daily, Disp: , Rfl:   EXAM:  Vitals:   05/03/17 0749  BP: (!) 100/58  Pulse: 67  Temp: 98 F (36.7 C)    Body mass index is 29.31 kg/m.  GENERAL: vitals reviewed and listed above, alert, oriented, appears well hydrated and in no acute  distress  HEENT: atraumatic, conjunttiva clear, no obvious abnormalities on inspection of external nose and ears  NECK: no obvious masses on inspection  LUNGS: clear to auscultation bilaterally, no wheezes, rales or rhonchi, good air movement  CV: HRRR, no peripheral edema  MS: moves all extremities without noticeable abnormality  PSYCH: pleasant and cooperative, no obvious depression or anxiety  ASSESSMENT AND PLAN:  Discussed the following assessment and plan:   Type 2 diabetes mellitus with diabetic polyneuropathy, with long-term current use of insulin ([place]) Managed by endocrinology Lifestyle recs -  see below  Hyperlipidemia, unspecified hyperlipidemia type - Plan: Lipid panel -cont current tx, labs, lifestyle recs -discussed gradual increase in gentle aerobic and gentle strength[place]  Essential hypertension - Plan: Basic metabolic panel, CBC Coronary artery disease due to lipid rich plaque Chronic diastolic congestive heart failure ([place]) -bp a little on low end, denies symptoms -he plans to follow up with his cardiologist -cont current meds, lifestyle recs  -Patient advised to return or notify a doctor immediately if symptoms worsen or persist or new concerns arise.  Patient Instructions  BEFORE YOU LEAVE: -follow up: 3 months -labs (please remove lipid panel if NOT fasting)  Increase exercise gradually - walking, cycling or water aerobics + light strength training would be great! Gradually work up to a goal of 20-30 minutes daily.  Advise a healthy diet. Try to eat at least 5-9 servings of vegetables and fruits per day (not corn, potatoes or bananas.) Avoid sweets, red meat, pork, butter, fried foods, fast food, processed food, excessive dairy, eggs and coconut. Replace bad fats with good fats - fish, nuts and seeds, canola oil, olive oil.   We have ordered labs or studies at this visit. It can take up to 1-2 weeks for results and  processing. IF results require follow up or explanation, we will call you with instructions. Clinically stable results will be released to your [facility]. If you have not heard from [place] or cannot find your results in [facility] in 2 weeks please contact our office at [phone].  If you are not yet signed up for [facility], please consider signing up.  WE NOW OFFER   [facility] FAST TRACK!!!  SAME DAY Appointments for ACUTE CARE  Such as: Sprains, Injuries, cuts, abrasions, rashes, muscle pain, joint pain, back pain Colds, flu, sore throats, headache, allergies, cough, fever  Ear pain, sinus and eye infections Abdominal pain, nausea, vomiting, diarrhea, upset stomach Animal/insect bites  3 Easy Ways to Schedule: Walk-In Scheduling Call in scheduling Mychart Sign-up: [email or web address]                   KIM, Jarrett Soho R., DO   HPI:  ROS: See pertinent positives and negatives per HPI.  Past Medical History:  Diagnosis Date  . Anemia   . Atherosclerotic PVD with intermittent claudication ([place])   . CAD (coronary artery disease)   . Diabetes mellitus    type 2 with peripheral neuropathy  . Diastolic CHF, chronic ([place])   . Diverticulosis   . DVT (deep venous thrombosis) ([place])   . Dyslipidemia   . Edema 08/19/2014  . GERD (gastroesophageal reflux disease)    hx hiatal hernia  . HTN (hypertension)   . IBS (irritable bowel syndrome)    Hx: of  . Osteoarthritis   . Overactive bladder    Hx: of stress incontinence  . Pneumonia 2002   history of  . Shingles   . Varicose veins of lower extremities with other complications 0/76/2263    Past Surgical History:  Procedure Laterality Date  . BACK SURGERY    . CATARACT EXTRACTION Bilateral 08/10/2015,08/15/2015  . COLONOSCOPY     Hx: of  . CTS bilateral    . KNEE ARTHROSCOPY  1988, 1992   bilateral  . LAMINECTOMY  08/18/2013   DECOMPRESSIVE LAMINECTOMIES  . LEFT AND RIGHT HEART CATHETERIZATION  WITH CORONARY ANGIOGRAM N/A 09/03/2014   Procedure: LEFT AND RIGHT HEART CATHETERIZATION WITH CORONARY ANGIOGRAM;  Surgeon: [name], MD;  Location: [place] CATH LAB;  Service: Cardiovascular;  Laterality: N/A;  . LUMBAR LAMINECTOMY/DECOMPRESSION MICRODISCECTOMY N/A 08/18/2013   Procedure: Lumbar two-three, lumbar three-four, lumbar four-five decompressive laminectomies;  Surgeon: [name], MD;  Location: [place] NEURO ORS;  Service: Neurosurgery;  Laterality: N/A;  . lumbar left arm  1961  . TONSILLECTOMY    . TOTAL KNEE ARTHROPLASTY  2005, 2007  . wrist tendon surgery      Family History  Problem Relation Age of Onset  . Other Mother 86       CABG  . Heart disease Mother   . Hypertension Mother   . Hyperlipidemia Son     Social History   Social History  . Marital status: Married    Spouse name: N/A  . Number of children: 2  . Years of education: N/A   Social History Main Topics  . Smoking status: Never Smoker  . Smokeless tobacco: Never Used  . Alcohol use No  . Drug use: No  . Sexual activity: Not Asked   Other Topics Concern  . 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  amLODipine (NORVASC) 5 MG tablet, TAKE ONE TABLET BY MOUTH DAILY, Disp: 90 tablet, Rfl: 2 .  aspirin 81 MG tablet, Take 81 mg by mouth daily. , Disp: , Rfl:  .  B-D UF III MINI PEN NEEDLES 31G X 5 MM MISC, USE FOUR TIMES A DAY AS DIRECTED, Disp: 200 each, Rfl: 3 .  doxazosin (CARDURA) 4 MG tablet, TAKE ONE TABLET BY MOUTH DAILY, Disp: 90 tablet, Rfl: 2 .  furosemide (LASIX) 40 MG tablet, TAKE ONE TABLET BY MOUTH DAILY, Disp: 90 tablet, Rfl: 0 .  insulin lispro  (HUMALOG KWIKPEN) 100 UNIT/ML KiwkPen, INJECT 4 TO 10 UNITS BEFORE EACH MEAL 3 TIMES A DAY, Disp: 15 pen, Rfl: 2 .  LEVEMIR FLEXTOUCH 100 UNIT/ML Pen, INJECT 20 UNITS INTO THE SKIN DAILY AT 10PM, Disp: 15 mL, Rfl: 2 .  metFORMIN (GLUCOPHAGE) 1000 MG tablet, TAKE ONE TABLET BY MOUTH TWICE A DAY WITH FOOD, Disp: 60 tablet, Rfl: 1 .  Multiple Vitamins-Minerals (CENTRUM SILVER PO), Take 1 tablet by mouth daily. , Disp: , Rfl:  .  NON FORMULARY, , Disp: , Rfl:  .  omeprazole (PRILOSEC) 20 MG capsule, TAKE 1 CAPSULE (20 MG TOTAL) BY MOUTH DAILY., Disp: 90 capsule, Rfl: 1 .  ONETOUCH DELICA LANCETS 29U MISC, USE TO TEST BLOOD SUGAR 3 TIMES A DAY AS INSTRUCTED, Disp: 200 each, Rfl: 0 .  ONETOUCH DELICA LANCETS 76L MISC, USE TO TEST BLOOD SUGAR 3 TIMES A DAY AS INSTRUCTED, Disp: 200 each, Rfl: 0 .  ONETOUCH VERIO test strip, USE TO TEST BLOOD SUGAR 3 TIMES A DAY AS DIRECTED, Disp: 100 each, Rfl: 5 .  OVER THE COUNTER MEDICATION, , Disp: , Rfl:  .  pravastatin (PRAVACHOL) 40 MG tablet, Take 1 tablet (40 mg total) by mouth daily. NEED OV., Disp: 30 tablet, Rfl: 0 .  Propylene Glycol (SYSTANE BALANCE) 0.6 % SOLN, Apply to eye. 2 drops each eye  once daily, Disp: , Rfl:   EXAM:  Vitals:   05/03/17 0749  BP: (!) 100/58  Pulse: 67  Temp: 98 F (36.7 C)    Body mass index is 29.31 kg/m.  GENERAL: vitals reviewed and listed above, alert, oriented, appears well hydrated and in no acute distress  HEENT: atraumatic, conjunttiva clear, no obvious abnormalities on inspection of external nose and ears  NECK: no obvious masses on inspection  LUNGS: clear to auscultation bilaterally, no wheezes, rales or rhonchi, good air movement  CV: HRRR, no peripheral edema  MS: moves all extremities without noticeable abnormality  PSYCH: pleasant and cooperative, no obvious depression or anxiety  ASSESSMENT AND PLAN:  Discussed the following assessment and plan:  Diabetic peripheral neuropathy associated with  type 2 diabetes mellitus (HCC)  Hyperlipidemia, unspecified hyperlipidemia type - Plan: Lipid panel  Essential hypertension - Plan: Basic metabolic panel, CBC  Coronary artery disease due to lipid rich plaque  Type 2 diabetes mellitus with diabetic polyneuropathy, with long-term current use of insulin (HCC)  Chronic diastolic congestive heart failure ([place])  -Patient advised to return or notify a doctor immediately if symptoms worsen or persist or new concerns arise.  Patient Instructions  BEFORE YOU LEAVE: -follow up: 3 months -labs (please remove lipid panel if NOT fasting)  Increase exercise gradually - walking, cycling or water aerobics + light strength training would be great! Gradually work up to a goal of 20-30 minutes daily.  Advise a healthy diet. Try to eat at least 5-9 servings of vegetables and fruits per day (not corn, potatoes or bananas.) Avoid sweets, red meat, pork, butter, fried foods, fast food, processed food, excessive dairy, eggs and coconut. Replace bad fats with good fats - fish, nuts and seeds, canola oil, olive oil.   We have ordered labs or studies at this visit. It can take up to 1-2 weeks for results and processing. IF results require follow up or explanation, we will call you with instructions. Clinically stable results will be released to your [facility]. If you have not heard from [place] or cannot find your results in [facility] in 2 weeks please contact our office at [phone].  If you are not yet signed up for [facility], please consider signing up.  WE NOW OFFER   [facility] FAST TRACK!!!  SAME DAY Appointments for ACUTE CARE  Such as: Sprains, Injuries, cuts, abrasions, rashes, muscle pain, joint pain, back pain Colds, flu, sore throats, headache, allergies, cough, fever  Ear pain, sinus and eye infections Abdominal pain, nausea, vomiting, diarrhea, upset stomach Animal/insect bites  3 Easy Ways to Schedule: Walk-In Scheduling Call in  scheduling Mychart Sign-up: [email or web address]                   Colin Benton R., DO

## 2017-05-03 ENCOUNTER — Encounter: Payer: Self-pay | Admitting: Family Medicine

## 2017-05-03 ENCOUNTER — Ambulatory Visit (INDEPENDENT_AMBULATORY_CARE_PROVIDER_SITE_OTHER): Payer: Medicare Other | Admitting: Family Medicine

## 2017-05-03 VITALS — BP 100/58 | HR 67 | Temp 98.0°F | Ht 70.0 in | Wt 204.3 lb

## 2017-05-03 DIAGNOSIS — I5032 Chronic diastolic (congestive) heart failure: Secondary | ICD-10-CM

## 2017-05-03 DIAGNOSIS — E1142 Type 2 diabetes mellitus with diabetic polyneuropathy: Secondary | ICD-10-CM | POA: Diagnosis not present

## 2017-05-03 DIAGNOSIS — I2510 Atherosclerotic heart disease of native coronary artery without angina pectoris: Secondary | ICD-10-CM | POA: Diagnosis not present

## 2017-05-03 DIAGNOSIS — Z794 Long term (current) use of insulin: Secondary | ICD-10-CM | POA: Diagnosis not present

## 2017-05-03 DIAGNOSIS — I2583 Coronary atherosclerosis due to lipid rich plaque: Secondary | ICD-10-CM

## 2017-05-03 DIAGNOSIS — E1169 Type 2 diabetes mellitus with other specified complication: Secondary | ICD-10-CM | POA: Diagnosis not present

## 2017-05-03 DIAGNOSIS — I10 Essential (primary) hypertension: Secondary | ICD-10-CM | POA: Diagnosis not present

## 2017-05-03 DIAGNOSIS — E785 Hyperlipidemia, unspecified: Secondary | ICD-10-CM

## 2017-05-03 DIAGNOSIS — E1159 Type 2 diabetes mellitus with other circulatory complications: Secondary | ICD-10-CM

## 2017-05-03 DIAGNOSIS — I152 Hypertension secondary to endocrine disorders: Secondary | ICD-10-CM

## 2017-05-03 HISTORY — DX: Type 2 diabetes mellitus with other specified complication: E11.69

## 2017-05-03 HISTORY — DX: Long term (current) use of insulin: Z79.4

## 2017-05-03 HISTORY — DX: Hyperlipidemia, unspecified: E78.5

## 2017-05-03 HISTORY — DX: Type 2 diabetes mellitus with diabetic polyneuropathy: E11.42

## 2017-05-03 LAB — CBC
HCT: 35.9 % — ABNORMAL LOW (ref 39.0–52.0)
Hemoglobin: 11.6 g/dL — ABNORMAL LOW (ref 13.0–17.0)
MCHC: 32.4 g/dL (ref 30.0–36.0)
MCV: 87.2 fl (ref 78.0–100.0)
Platelets: 170 10*3/uL (ref 150.0–400.0)
RBC: 4.12 Mil/uL — ABNORMAL LOW (ref 4.22–5.81)
RDW: 15.3 % (ref 11.5–15.5)
WBC: 5.3 10*3/uL (ref 4.0–10.5)

## 2017-05-03 LAB — BASIC METABOLIC PANEL
BUN: 18 mg/dL (ref 6–23)
CO2: 27 mEq/L (ref 19–32)
Calcium: 9.5 mg/dL (ref 8.4–10.5)
Chloride: 102 mEq/L (ref 96–112)
Creatinine, Ser: 0.96 mg/dL (ref 0.40–1.50)
GFR: 79.79 mL/min (ref >60.00)
Glucose, Bld: 121 mg/dL — ABNORMAL HIGH (ref 70–99)
Potassium: 4.7 mEq/L (ref 3.5–5.1)
Sodium: 136 mEq/L (ref 135–145)

## 2017-05-03 LAB — LIPID PANEL
CHOL/HDL RATIO: 2
Cholesterol: 96 mg/dL (ref 0–200)
HDL: 39.1 mg/dL (ref >39.00)
LDL Cholesterol: 44 mg/dL (ref 0–99)
NONHDL: 56.84
TRIGLYCERIDES: 64 mg/dL (ref 0.0–149.0)
VLDL: 12.8 mg/dL (ref 0.0–40.0)

## 2017-05-03 NOTE — Patient Instructions (Signed)
BEFORE YOU LEAVE: -follow up: 3 months -labs (please remove lipid panel if NOT fasting)  Increase exercise gradually - walking, cycling or water aerobics + light strength training would be great! Gradually work up to a goal of 20-30 minutes daily.  Advise a healthy diet. Try to eat at least 5-9 servings of vegetables and fruits per day (not corn, potatoes or bananas.) Avoid sweets, red meat, pork, butter, fried foods, fast food, processed food, excessive dairy, eggs and coconut. Replace bad fats with good fats - fish, nuts and seeds, canola oil, olive oil.   We have ordered labs or studies at this visit. It can take up to 1-2 weeks for results and processing. IF results require follow up or explanation, we will call you with instructions. Clinically stable results will be released to your [facility]. If you have not heard from [place] or cannot find your results in [facility] in 2 weeks please contact our office at [phone].  If you are not yet signed up for [facility], please consider signing up.  WE NOW OFFER   [place] [facility] FAST TRACK!!!  SAME DAY Appointments for ACUTE CARE  Such as: Sprains, Injuries, cuts, abrasions, rashes, muscle pain, joint pain, back pain Colds, flu, sore throats, headache, allergies, cough, fever  Ear pain, sinus and eye infections Abdominal pain, nausea, vomiting, diarrhea, upset stomach Animal/insect bites  3 Easy Ways to Schedule: Walk-In Scheduling Call in scheduling Mychart Sign-up: [email or web address]

## 2017-05-09 ENCOUNTER — Other Ambulatory Visit: Payer: Self-pay | Admitting: Cardiology

## 2017-05-09 NOTE — Telephone Encounter (Signed)
Rx has been sent to the pharmacy electronically. ° °

## 2017-05-24 ENCOUNTER — Other Ambulatory Visit: Payer: Self-pay | Admitting: Cardiology

## 2017-05-25 ENCOUNTER — Other Ambulatory Visit: Payer: Self-pay | Admitting: Internal Medicine

## 2017-06-18 ENCOUNTER — Other Ambulatory Visit: Payer: Self-pay | Admitting: Internal Medicine

## 2017-06-19 NOTE — Progress Notes (Signed)
HPI: FU CAD and diastolic CHF. ABIs 3/14 normal. Seen previously for chest pain, dyspnea and lower ext edema. Echocardiogram September 2015 showed normal LV function, grade 1 diastolic dysfunction and mild mitral regurgitation. Moderate left atrial enlargement. Cardiac catheterization September 2015 showed a 30% left main, 40-50% LAD and no obstructive disease in the circumflex or right coronary artery. PCWP 4. PA pressure 19 over 4. Since last seen, patient denies dyspnea, palpitations or syncope. He has an occasional pain in his chest for 1-2 seconds but no exertional chest pain. He does have leg weakness.  Current Outpatient Prescriptions  Medication Sig Dispense Refill  . amLODipine (NORVASC) 5 MG tablet TAKE ONE TABLET BY MOUTH DAILY 90 tablet 2  . aspirin 81 MG tablet Take 81 mg by mouth daily.     . B-D UF III MINI PEN NEEDLES 31G X 5 MM MISC USE FOUR TIMES A DAY AS DIRECTED 200 each 3  . doxazosin (CARDURA) 4 MG tablet TAKE ONE TABLET BY MOUTH DAILY 90 tablet 2  . furosemide (LASIX) 40 MG tablet TAKE ONE TABLET BY MOUTH DAILY 90 tablet 0  . ibuprofen (ADVIL,MOTRIN) 200 MG tablet Take 400 mg by mouth 2 (two) times daily.    . insulin lispro (HUMALOG KWIKPEN) 100 UNIT/ML KiwkPen INJECT 4 TO 10 UNITS BEFORE EACH MEAL 3 TIMES A DAY 15 pen 2  . LEVEMIR FLEXTOUCH 100 UNIT/ML Pen INJECT 20 UNITS INTO THE SKIN DAILY AT 10PM 15 mL 1  . metFORMIN (GLUCOPHAGE) 1000 MG tablet TAKE ONE TABLET BY MOUTH TWICE A DAY WITH FOOD 60 tablet 0  . Multiple Vitamins-Minerals (CENTRUM SILVER PO) Take 1 tablet by mouth daily.     . NON FORMULARY     . omeprazole (PRILOSEC) 20 MG capsule TAKE 1 CAPSULE (20 MG TOTAL) BY MOUTH DAILY. 90 capsule 1  . ONETOUCH DELICA LANCETS 40J MISC USE TO TEST BLOOD SUGAR 3 TIMES A DAY AS INSTRUCTED 200 each 0  . ONETOUCH VERIO test strip USE TO TEST BLOOD SUGAR 3 TIMES A DAY AS DIRECTED 100 each 5  . OVER THE COUNTER MEDICATION     . pravastatin (PRAVACHOL) 40 MG tablet  Take 1 tablet (40 mg total) by mouth daily. 60 tablet 0  . Propylene Glycol (SYSTANE BALANCE) 0.6 % SOLN Apply to eye. 2 drops each eye once daily    . vitamin B-12 (CYANOCOBALAMIN) 1000 MCG tablet Take 1,000 mcg by mouth daily.     No current facility-administered medications for this visit.      Past Medical History:  Diagnosis Date  . Anemia   . Atherosclerotic PVD with intermittent claudication ([place])   . CAD (coronary artery disease)   . Diabetes mellitus    type 2 with peripheral neuropathy  . Diastolic CHF, chronic ([place])   . Diverticulosis   . DVT (deep venous thrombosis) ([place])   . Dyslipidemia   . Edema 08/19/2014  . GERD (gastroesophageal reflux disease)    hx hiatal hernia  . HTN (hypertension)   . IBS (irritable bowel syndrome)    Hx: of  . Osteoarthritis   . Overactive bladder    Hx: of stress incontinence  . Pneumonia 2002   history of  . Shingles   . Varicose veins of lower extremities with other complications 07/21/9146    Past Surgical History:  Procedure Laterality Date  . BACK SURGERY    . CATARACT EXTRACTION Bilateral 08/10/2015,08/15/2015  . COLONOSCOPY     Hx:  of  . CTS bilateral    . KNEE ARTHROSCOPY  1988, 1992   bilateral  . LAMINECTOMY  08/18/2013   DECOMPRESSIVE LAMINECTOMIES  . LEFT AND RIGHT HEART CATHETERIZATION WITH CORONARY ANGIOGRAM N/A 09/03/2014   Procedure: LEFT AND RIGHT HEART CATHETERIZATION WITH CORONARY ANGIOGRAM;  Surgeon: [name], MD;  Location[facility] C[facility] LAB;  Service: Cardiovascular;  Laterality: N/A;  . LUMBAR LAMINECTOMY/DECOMPRESSION MICRODISCECTOMY N/A 08/18/2013   Procedure: Lumbar two-three, lumbar three-four, lumbar four-five decompressive laminectomies;  Surgeon: [name], MD;  Location: [place] NEURO ORS;  Service: Neurosurgery;  Laterality: N/A;  . lumbar left arm  1961  . TONSILLECTOMY    . TOTAL KNEE ARTHROPLASTY  2005, 2007  . wrist tendon surgery      Social History   Social History  . Marital status:  Married    Spouse name: N/A  . Number of children: 2  . Years of education: N/A   Occupational History  . Not on file.   Social History Main Topics  . Smoking status: Never Smoker  . Smokeless tobacco: Never Used  . Alcohol use No  . Drug use: No  . Sexual activity: Not on file   Other Topics Concern  . 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 Other Mother 80       CABG  . Heart disease Mother   . Hypertension Mother   . Hyperlipidemia Son     ROS: Some leg weakness and fatigue but no fevers or chills, productive cough, hemoptysis, dysphasia, odynophagia, melena, hematochezia, dysuria, hematuria, rash, seizure activity, orthopnea, PND, pedal edema, claudication. Remaining systems are negative.  Physical Exam: Well-developed well-nourished in no acute distress.  Skin is warm and dry.  HEENT is normal.  Neck is supple.  Chest is clear to auscultation with normal expansion.  Cardiovascular exam is regular rate and rhythm. 1/6 SEM Abdominal exam nontender or distended. No masses palpated. Extremities show no edema. neuro grossly intact  ECG- Sinus rhythm at a rate of 64. Left axis deviation. Occasional PAC. personally reviewed  A/P  1 Coronary artery disease-continue aspirin and statin.  2 chronic diastolic congestive heart failure-patient is euvolemic on examination. Continue present dose of diuretic.  3 hyperlipidemia-continue statin. Patient is describing some leg weakness but does not sound to have myalgias. I will check a CK. We will also check liver functions. Laboratories from May 2018 personally reviewed and  showed LDL 44.  4 hypertension-blood pressure controlled. Continue present medications.  Kirk Ruths, MD

## 2017-06-28 ENCOUNTER — Ambulatory Visit (INDEPENDENT_AMBULATORY_CARE_PROVIDER_SITE_OTHER): Payer: Medicare Other | Admitting: Cardiology

## 2017-06-28 ENCOUNTER — Encounter: Payer: Self-pay | Admitting: Cardiology

## 2017-06-28 VITALS — BP 140/70 | HR 64 | Ht 70.0 in | Wt 208.0 lb

## 2017-06-28 DIAGNOSIS — R29898 Other symptoms and signs involving the musculoskeletal system: Secondary | ICD-10-CM

## 2017-06-28 DIAGNOSIS — I2510 Atherosclerotic heart disease of native coronary artery without angina pectoris: Secondary | ICD-10-CM

## 2017-06-28 DIAGNOSIS — I10 Essential (primary) hypertension: Secondary | ICD-10-CM

## 2017-06-28 DIAGNOSIS — E7800 Pure hypercholesterolemia, unspecified: Secondary | ICD-10-CM | POA: Diagnosis not present

## 2017-06-28 LAB — HEPATIC FUNCTION PANEL
ALK PHOS: 58 IU/L (ref 39–117)
ALT: 23 IU/L (ref 0–44)
AST: 33 IU/L (ref 0–40)
Albumin: 4.4 g/dL (ref 3.5–4.7)
BILIRUBIN TOTAL: 0.5 mg/dL (ref 0.0–1.2)
BILIRUBIN, DIRECT: 0.16 mg/dL (ref 0.00–0.40)
TOTAL PROTEIN: 6.9 g/dL (ref 6.0–8.5)

## 2017-06-28 LAB — CK: Total CK: 353 U/L — ABNORMAL HIGH (ref 24–204)

## 2017-06-28 MED ORDER — PRAVASTATIN SODIUM 40 MG PO TABS: 40.0000 mg | ORAL_TABLET | Freq: Every day | ORAL | 0 refills | Status: DC

## 2017-06-28 NOTE — Patient Instructions (Signed)
Medication Instructions:   NO CHANGE  Labwork:  Your physician recommends that you HAVE LAB WORK TODAY  Follow-Up:  Your physician wants you to follow-up in: ONE YEAR WITH DR CRENSHAW You will receive a reminder letter in the mail two months in advance. If you don't receive a letter, please call our office to schedule the follow-up appointment.   If you need a refill on your cardiac medications before your next appointment, please call your pharmacy.    

## 2017-06-29 ENCOUNTER — Other Ambulatory Visit: Payer: Self-pay

## 2017-06-29 DIAGNOSIS — R29898 Other symptoms and signs involving the musculoskeletal system: Secondary | ICD-10-CM

## 2017-07-12 ENCOUNTER — Other Ambulatory Visit: Payer: Self-pay | Admitting: Internal Medicine

## 2017-07-16 ENCOUNTER — Other Ambulatory Visit: Payer: Self-pay | Admitting: Internal Medicine

## 2017-07-27 ENCOUNTER — Other Ambulatory Visit: Payer: Self-pay | Admitting: Internal Medicine

## 2017-07-31 NOTE — Progress Notes (Signed)
HPI:  [name] is a pleasant 83 y.o. here for follow up. Chronic medical problems summarized below were reviewed for changes.These issues and their treatment remain stable for the most part. He saw his cardiologist in July. He was having some leg complaints and his cardiologist checked a CPK which was a little elevated. He has been advised to stop his statin and follow up with his cardiologist in 6 weeks.  He has had recurrence of some neck issues for the last 2 months. Has pain in the right posterior neck radiating to the right shoulder with certain movements of the head and neck. She had this pain in the past and had significant degenerative disc disease on plain films. We did refer him to Dr. Nelva Bush at [facility]. It seems he had forgotten that he had seen him. He has not returned. He is interested in seeing him again. Denies radiation to arms or hands, weakness, numbness, severe persistent symptoms, malaise or fevers.He seems interested in nonsurgical options, perhaps trying physical therapy again and consideration of osteopathic treatments Denies CP, SOB, DOE, treatment intolerance or new symptoms.  Due for flu vaccine. Labs this time her next time. Annual wellness visit was in 04/2017  Diabetes - Last G3O 7/56 7.0 -complications: peripheral neuropathy -managed by endocrinology  -meds: metformin,Levimir, insulin with meals, asa, statin -eye exam: sees Dr. Idolina Primer -foot exam: utd  Essential hypertension - stable  -meds: norvasc 5, doxasosin 4, lasix 40,   CAD, Chronic diastolic congestive heart failure - 2-D echo on 08/26/14 showed EF 60-65% with grade 1 diastolic dysfunction.  -cath 2015 30% LM, 40-50%LAD  -meds: asa, statin stopped 06/2017 for leg complaints and elevated CK - managed by his cardiologist, lasix  -cardiologist: Dr. [name]  OAB (overactive bladder) -meds: Cardura, myrbetriq, vesicare -stable  -[place] urologist, Dr.  Patsy Baltimore  GERD: -meds: prilosec  Hyperlipemia - stable  - meds: Pravastatin   Anemia: -during hospital stay 2016 and mild in the past on review cardiology records  -no bleeding    ROS: See pertinent positives and negatives per HPI.  Past Medical History:  Diagnosis Date  . Anemia   . Atherosclerotic PVD with intermittent claudication ([place])   . CAD (coronary artery disease)   . Diabetes mellitus    type 2 with peripheral neuropathy  . Diastolic CHF, chronic ([place])   . Diverticulosis   . DVT (deep venous thrombosis) ([place])   . Dyslipidemia   . Edema 08/19/2014  . GERD (gastroesophageal reflux disease)    hx hiatal hernia  . HTN (hypertension)   . IBS (irritable bowel syndrome)    Hx: of  . Osteoarthritis   . Overactive bladder    Hx: of stress incontinence  . Pneumonia 2002   history of  . Shingles   . Varicose veins of lower extremities with other complications 4/33/2951    Past Surgical History:  Procedure Laterality Date  . BACK SURGERY    . CATARACT EXTRACTION Bilateral 08/10/2015,08/15/2015  . COLONOSCOPY     Hx: of  . CTS bilateral    . KNEE ARTHROSCOPY  1988, 1992   bilateral  . LAMINECTOMY  08/18/2013   DECOMPRESSIVE LAMINECTOMIES  . LEFT AND RIGHT HEART CATHETERIZATION WITH CORONARY ANGIOGRAM N/A 09/03/2014   Procedure: LEFT AND RIGHT HEART CATHETERIZATION WITH CORONARY ANGIOGRAM;  Surgeon: [name], MD;  Location: [facility] CATH LAB;  Service: Cardiovascular;  Laterality: N/A;  . LUMBAR LAMINECTOMY/DECOMPRESSION MICRODISCECTOMY N/A 08/18/2013   Procedure: Lumbar two-three, lumbar three-four,  lumbar four-five decompressive laminectomies;  Surgeon: [name], MD;  Location: [place] NEURO ORS;  Service: Neurosurgery;  Laterality: N/A;  . lumbar left arm  1961  . TONSILLECTOMY    . TOTAL KNEE ARTHROPLASTY  2005, 2007  . wrist tendon surgery      Family History  Problem Relation Age of Onset  . Other Mother 15       CABG  . Heart disease Mother    . Hypertension Mother   . Hyperlipidemia Son     Social History   Social History  . Marital status: Married    Spouse name: N/A  . Number of children: 2  . Years of education: N/A   Social History Main Topics  . Smoking status: Never Smoker  . Smokeless tobacco: Never Used  . Alcohol use No  . Drug use: No  . Sexual activity: Not Asked   Other Topics Concern  . 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  amLODipine (NORVASC) 5 MG tablet, TAKE ONE TABLET BY MOUTH DAILY, Disp: 90 tablet, Rfl: 2 .  aspirin 81 MG tablet, Take 81 mg by mouth daily. , Disp: , Rfl:  .  B-D UF III MINI PEN NEEDLES 31G X 5 MM MISC, USE FOUR TIMES A DAY AS DIRECTED, Disp: 200 each, Rfl: 3 .  doxazosin (CARDURA) 4 MG tablet, TAKE ONE TABLET BY MOUTH DAILY, Disp: 90 tablet, Rfl: 2 .  furosemide (LASIX) 40 MG tablet, TAKE ONE TABLET BY MOUTH DAILY, Disp: 90 tablet, Rfl: 0 .  ibuprofen (ADVIL,MOTRIN) 200 MG tablet, Take 400 mg by mouth 2 (two) times daily., Disp: , Rfl:  .  insulin lispro (HUMALOG KWIKPEN) 100 UNIT/ML KiwkPen, INJECT 4 TO 10 UNITS BEFORE EACH MEAL 3 TIMES A DAY, Disp: 15 pen, Rfl: 2 .  LEVEMIR FLEXTOUCH 100 UNIT/ML Pen, INJECT 20 UNITS INTO THE SKIN DAILY AT 10PM, Disp: 15 mL, Rfl: 1 .  metFORMIN (GLUCOPHAGE) 1000 MG tablet, TAKE ONE TABLET BY MOUTH TWICE A DAY WITH FOOD, Disp: 60 tablet, Rfl: 0 .  Multiple Vitamins-Minerals (CENTRUM SILVER PO), Take 1 tablet by mouth daily. , Disp: , Rfl:  .  NON FORMULARY, , Disp: , Rfl:  .  omeprazole (PRILOSEC) 20 MG capsule, TAKE 1 CAPSULE (20 MG TOTAL) BY MOUTH DAILY., Disp: 90 capsule, Rfl: 1 .  ONETOUCH DELICA LANCETS  45Y MISC, USE TO TEST BLOOD SUGAR 3 TIMES A DAY AS INSTRUCTED, Disp: 200 each, Rfl: 0 .  ONETOUCH DELICA LANCETS 09X MISC, USE TO TEST BLOOD SUGAR 3 TIMES A DAY AS INSTRUCTED, Disp: 200 each, Rfl: 0 .  ONETOUCH VERIO test strip, USE TO TEST BLOOD SUGAR THREE TIMES A DAY AS DIRECTED, Disp: 200 each, Rfl: 4 .  OVER THE COUNTER MEDICATION, , Disp: , Rfl:  .  Propylene Glycol (SYSTANE BALANCE) 0.6 % SOLN, Apply to eye. 2 drops each eye once daily, Disp: , Rfl:  .  vitamin B-12 (CYANOCOBALAMIN) 1000 MCG tablet, Take 1,000 mcg by mouth daily., Disp: , Rfl:   EXAM:  Vitals:   08/02/17 0751  BP: 128/78  Pulse: 68  Temp: 98.3 F (36.8 C)    Body mass index is 29.49 kg/m.  GENERAL: vitals reviewed and listed above, alert, oriented, appears well hydrated and in no acute distress  HEENT: atraumatic, conjunttiva clear, no obvious abnormalities on inspection of external nose and ears  NECK: no obvious masses on inspection  LUNGS: clear to auscultation bilaterally, no wheezes, rales or rhonchi, good air movement  CV: HRRR, no peripheral edema  MS: moves all extremities without noticeable abnormality  PSYCH: pleasant and cooperative, no obvious depression or anxiety  ASSESSMENT AND PLAN:  Discussed the following assessment and plan:  Type 2 diabetes mellitus with diabetic polyneuropathy, with long-term current use of insulin (HCC)  Hyperlipidemia associated with type 2 diabetes mellitus (HCC)  Chronic diastolic congestive heart failure (HCC)  Coronary artery disease due to lipid rich plaque  Diabetic peripheral neuropathy associated with type 2 diabetes mellitus ([place])  Hypertension associated with diabetes ([place])  DDD (degenerative disc disease), cervical - Referred to Dr. Nelva Bush in 2017  -continue current medications -He has follow-up planned with his cardiologist regarding the statin - reports he feels better off of the cholesterol medication -We talked about his neck issues and  decided to have him follow back up with Dr. Nelva Bush as I do feel this is likely coming from his degenerative disc disease- he agrees to call for an appointment -lifestyle recommendations -Labs with next visit -Patient advised to return or notify a doctor immediately if symptoms worsen or persist or new concerns arise.  Patient Instructions  BEFORE YOU LEAVE: -Phone number for Dr. Nelva Bush to call for follow up regarding his neck -follow up: 3-4 months - will do labs then -flu shot today or in 1 month  Please call Dr. Jeralyn Ruths office today for an appointment for follow up regarding the neck issues.  WE NOW OFFER   [facility] FAST TRACK!!!  SAME DAY Appointments for ACUTE CARE  Such as: Sprains, Injuries, cuts, abrasions, rashes, muscle pain, joint pain, back pain Colds, flu, sore throats, headache, allergies, cough, fever  Ear pain, sinus and eye infections Abdominal pain, nausea, vomiting, diarrhea, upset stomach Animal/insect bites  3 Easy Ways to Schedule: Walk-In Scheduling Call in scheduling Mychart Sign-up: [email or web address]            Colin Benton R., DO

## 2017-08-02 ENCOUNTER — Encounter: Payer: Self-pay | Admitting: Family Medicine

## 2017-08-02 ENCOUNTER — Ambulatory Visit (INDEPENDENT_AMBULATORY_CARE_PROVIDER_SITE_OTHER): Payer: Medicare Other | Admitting: Family Medicine

## 2017-08-02 VITALS — BP 128/78 | HR 68 | Temp 98.3°F | Ht 70.0 in | Wt 205.5 lb

## 2017-08-02 DIAGNOSIS — I5032 Chronic diastolic (congestive) heart failure: Secondary | ICD-10-CM

## 2017-08-02 DIAGNOSIS — E785 Hyperlipidemia, unspecified: Secondary | ICD-10-CM

## 2017-08-02 DIAGNOSIS — I10 Essential (primary) hypertension: Secondary | ICD-10-CM

## 2017-08-02 DIAGNOSIS — I2510 Atherosclerotic heart disease of native coronary artery without angina pectoris: Secondary | ICD-10-CM

## 2017-08-02 DIAGNOSIS — M5030 Other cervical disc degeneration, unspecified cervical region: Secondary | ICD-10-CM

## 2017-08-02 DIAGNOSIS — Z23 Encounter for immunization: Secondary | ICD-10-CM

## 2017-08-02 DIAGNOSIS — Z794 Long term (current) use of insulin: Secondary | ICD-10-CM | POA: Diagnosis not present

## 2017-08-02 DIAGNOSIS — E1142 Type 2 diabetes mellitus with diabetic polyneuropathy: Secondary | ICD-10-CM

## 2017-08-02 DIAGNOSIS — I2583 Coronary atherosclerosis due to lipid rich plaque: Secondary | ICD-10-CM | POA: Diagnosis not present

## 2017-08-02 DIAGNOSIS — E1169 Type 2 diabetes mellitus with other specified complication: Secondary | ICD-10-CM | POA: Diagnosis not present

## 2017-08-02 DIAGNOSIS — E1159 Type 2 diabetes mellitus with other circulatory complications: Secondary | ICD-10-CM

## 2017-08-02 HISTORY — DX: Other cervical disc degeneration, unspecified cervical region: M50.30

## 2017-08-02 NOTE — Patient Instructions (Signed)
BEFORE YOU LEAVE: -Phone number for Dr. Nelva Bush to call for follow up regarding his neck -follow up: 3-4 months - will do labs then -flu shot today or in 1 month  Please call Dr. Jeralyn Ruths office today for an appointment for follow up regarding the neck issues.  WE NOW OFFER   [facility] FAST TRACK!!!  SAME DAY Appointments for ACUTE CARE  Such as: Sprains, Injuries, cuts, abrasions, rashes, muscle pain, joint pain, back pain Colds, flu, sore throats, headache, allergies, cough, fever  Ear pain, sinus and eye infections Abdominal pain, nausea, vomiting, diarrhea, upset stomach Animal/insect bites  3 Easy Ways to Schedule: Walk-In Scheduling Call in scheduling Mychart Sign-up: [email or web address]

## 2017-08-02 NOTE — Addendum Note (Signed)
Addended by: Agnes Lawrence on: 08/02/2017 08:38 AM   Modules accepted: Orders

## 2017-08-08 ENCOUNTER — Other Ambulatory Visit: Payer: Self-pay | Admitting: Internal Medicine

## 2017-08-09 ENCOUNTER — Other Ambulatory Visit: Payer: Self-pay | Admitting: Internal Medicine

## 2017-08-10 ENCOUNTER — Encounter: Payer: Self-pay | Admitting: *Deleted

## 2017-08-10 LAB — CK: Total CK: 368 U/L — ABNORMAL HIGH (ref 24–204)

## 2017-08-10 NOTE — Telephone Encounter (Signed)
-----   Message from Lelon Perla, MD sent at 08/10/2017  4:46 PM EDT ----- [place]

## 2017-08-10 NOTE — Telephone Encounter (Signed)
This encounter was created in error - please disregard.

## 2017-08-13 ENCOUNTER — Other Ambulatory Visit: Payer: Self-pay | Admitting: Internal Medicine

## 2017-08-19 ENCOUNTER — Other Ambulatory Visit: Payer: Self-pay | Admitting: Cardiology

## 2017-08-23 ENCOUNTER — Ambulatory Visit (INDEPENDENT_AMBULATORY_CARE_PROVIDER_SITE_OTHER): Payer: Medicare Other | Admitting: Internal Medicine

## 2017-08-23 ENCOUNTER — Encounter: Payer: Self-pay | Admitting: Internal Medicine

## 2017-08-23 VITALS — BP 130/56 | HR 66 | Wt 205.6 lb

## 2017-08-23 DIAGNOSIS — E1151 Type 2 diabetes mellitus with diabetic peripheral angiopathy without gangrene: Secondary | ICD-10-CM | POA: Diagnosis not present

## 2017-08-23 DIAGNOSIS — E1165 Type 2 diabetes mellitus with hyperglycemia: Secondary | ICD-10-CM

## 2017-08-23 DIAGNOSIS — E1142 Type 2 diabetes mellitus with diabetic polyneuropathy: Secondary | ICD-10-CM

## 2017-08-23 DIAGNOSIS — IMO0002 Reserved for concepts with insufficient information to code with codable children: Secondary | ICD-10-CM

## 2017-08-23 LAB — POCT GLYCOSYLATED HEMOGLOBIN (HGB A1C): HEMOGLOBIN A1C: 6.9

## 2017-08-23 NOTE — Patient Instructions (Addendum)
Please continue: - Metformin 1000 mg 2x a day - Levemir 20 units at bedtime. - Humalog 15 min before a meal:  4 units before a small meal (use 4 units before b'fast) 6 units before a regular meal 8-10 units before a large meal Please subtract 2 units if you plan to be active after a meal.  Please come back for a follow-up appointment in 4 months. +

## 2017-08-23 NOTE — Progress Notes (Signed)
Subjective:     Patient ID: [name], male   DOB: Apr 22, 1933, 83 y.o.   MRN: [id number]  HPI [name] is a very pleasant 83 y.o. man returning for management of DM2, dx 07/2012, uncontrolled, insulin-dependent, with complications (CAD, peripheral neuropathy, PAD). Last visit 4 months ago.  [name] is still using a walker after his fall in 09/2015. He continues to have weakness in his legs and his equilibrium. He had physical therapy but does not feel that this helped. He saw Bobby. Nelva [name] and will have an MRI of his neck tomorrow.  Last HbA1C was: Lab Results  Component Value Date   HGBA1C 7.0 04/18/2017   HGBA1C 6.7 01/17/2017   HGBA1C 6.8 10/17/2016   He is on: - Metformin 1000 mg 2x a day - Levemir 20 units at bedtime. - Humalog 15 min before a meal:  4 units before a small meal (use 4 units before b'fast) 6 units before a regular meal 8(-10)units before a large meal Please subtract 2 units if you plan to be active after a meal.  Pt brings a good log >> reviewed together: - am:  88-126 >> 97-131 >> 74, 80-120 >> 70-125 - 2h after b'fast: 84-153 >> 91-138 >> 120-160 >> 70-145 - before lunch:  75-116, 132 >> 91-125 >> 70-146, 150 - 2h after lunch: 80-150 >> 102-148 >> 97-125 >> 132, 140 - dinner: 70-152 >> 81-140, 160 x1 >> 80-145 - 2h after dinner:  80-153 >> 109-145 >> 119-150 >> 82-140 - bedtime: 91-140, 150 >> 88, 110-132 >> 84-150, 160 - nighttime: 115-160 >> 90-133 >> 72, 90-152 Has hypoglycemia awareness at 70.  - No CKD. Last BUN/Cr: Lab Results  Component Value Date   BUN 18 05/03/2017   CREATININE 0.96 05/03/2017  Not on ACEI/ARB. - last lipid panel: Lab Results  Component Value Date   CHOL 96 05/03/2017   HDL 39.10 05/03/2017   LDLCALC 44 05/03/2017   TRIG 64.0 05/03/2017   CHOLHDL 2 05/03/2017  He is off Pravastatin. - He has peripheral neuropathy. He is taking: - B complex - neuropathy cream (compounded) Sees [name]. - Last  eye exam  10/2016 >> No Bobby ([name]). Had Cataract Sx in 08/2015. He has macular degeneration >> on Preservision Areds vitamins. - On ASA 81  He had a heart catheterization on 09/03/2014 >> 2 small Blockages >> no intervention other than statins. 2D ECHO with normal EF. In 08/2015 he was hospitalized for ?sepsis with strep pneumoniae after acquiring left lower lobe pneumonia.   Review of Systems Constitutional: no weight gain/no weight loss, no fatigue, no subjective hyperthermia, no subjective hypothermia Eyes: no blurry vision, no xerophthalmia ENT: no sore throat, no nodules palpated in throat, no dysphagia, no odynophagia, no hoarseness Cardiovascular: no CP/no SOB/no palpitations/no leg swelling Respiratory: no cough/no SOB/no wheezing Gastrointestinal: no N/no V/no D/no C/no acid reflux Musculoskeletal: no muscle aches/no joint aches Skin: no rashes, no hair loss Neurological: no tremors/no numbness/no tingling/no dizziness  I reviewed pt's medications, allergies, PMH, social hx, family hx, and changes were documented in the history of present illness. Otherwise, unchanged from my initial visit note.  Objective:   Physical Exam BP (!) 130/56   Pulse 66   Wt 205 lb 9.6 oz (93.3 kg)   SpO2 99%   BMI 29.50 kg/m  Body mass index is 29.5 kg/m.  Wt Readings from Last 3 Encounters:  08/23/17 205 lb 9.6 oz (93.3 kg)  08/02/17 205 lb 8 oz (93.2 kg)  06/28/17 208 lb (94.3 kg)   Constitutional: overweight, in NAD Eyes: PERRLA, EOMI, no exophthalmos ENT: moist mucous membranes, no thyromegaly, no cervical lymphadenopathy Cardiovascular: RRR, No RG, + 1/6 SEM Respiratory: CTA B Gastrointestinal: abdomen soft, NT, ND, BS+ Musculoskeletal: no deformities, strength intact in all 4 Skin: moist, warm, no rashes Neurological: no tremor with outstretched hands, DTR normal in all 4   Assessment:     1. DM2, insulin-dependent, uncontrolled, with complications; target N6E for  him: 7-7.5% - CAD - peripheral neuropathy - PAD Bobby. [name]  2. PN - 2/2 DM  Plan:     1. Pt with long standing, uncontrolled DM2, but improved in last year. We did not change his regimen at last visit. At this visit, he has more 70s in his log, but this was as he increased his Humalog to 8 units before most meals >> discussed to move the Humalog dose back to 6 units if he has a regular meal. OTW, no changes needed. - I advised him to: Patient Instructions  Please continue: - Metformin 1000 mg 2x a day - Levemir 20 units at bedtime. - Humalog 15 min before a meal:  4 units before a small meal (use 4 units before b'fast) 6 units before a regular meal 8-10units before a large meal Please subtract 2 units if you plan to be active after a meal.  Please come back for a follow-up appointment in 4 months.  - today, HbA1c is 6.9% (great!) - continue checking sugars at different times of the day - check 3x a day, rotating checks - advised for yearly eye exams >> he is UTD - Return to clinic in 4 mo with sugar log   2. PN - diabetic - he has a little more tingling >> will see Bobby. Barkley [name] soon - uses the cream as needed, but B complex daily.  Bobby Kingdom, MD PhD [facility]

## 2017-09-07 ENCOUNTER — Other Ambulatory Visit: Payer: Self-pay | Admitting: Family Medicine

## 2017-09-10 ENCOUNTER — Other Ambulatory Visit: Payer: Self-pay | Admitting: Internal Medicine

## 2017-09-12 ENCOUNTER — Other Ambulatory Visit: Payer: Self-pay | Admitting: Internal Medicine

## 2017-09-13 ENCOUNTER — Other Ambulatory Visit: Payer: Self-pay | Admitting: Internal Medicine

## 2017-09-20 ENCOUNTER — Other Ambulatory Visit: Payer: Self-pay | Admitting: Family Medicine

## 2017-09-21 ENCOUNTER — Other Ambulatory Visit: Payer: Self-pay

## 2017-09-21 MED ORDER — METFORMIN HCL 1000 MG PO TABS: 1000.0000 mg | ORAL_TABLET | Freq: Two times a day (BID) | ORAL | 0 refills | Status: DC

## 2017-10-02 ENCOUNTER — Other Ambulatory Visit: Payer: Self-pay | Admitting: Internal Medicine

## 2017-10-02 NOTE — Telephone Encounter (Signed)
Patient need refill of insulin levemir send to [facility]

## 2017-10-16 LAB — HM DIABETES EYE EXAM

## 2017-11-16 ENCOUNTER — Other Ambulatory Visit: Payer: Self-pay | Admitting: Internal Medicine

## 2017-11-30 ENCOUNTER — Other Ambulatory Visit: Payer: Self-pay | Admitting: Internal Medicine

## 2017-12-01 ENCOUNTER — Other Ambulatory Visit: Payer: Self-pay | Admitting: Family Medicine

## 2017-12-08 NOTE — Progress Notes (Signed)
HPI:  [name] is a pleasant 83 y.o. here for follow up. Chronic medical problems summarized below were reviewed for changes and stability and were updated as needed below. These issues and their treatment remain stable for the most part. Doing well. Denies any muscle cramps or soreness out of the ordinary. Still holding the statin as he did not hear back from his cardiologist about this. Denies CP, SOB, DOE, hypoglycemia, treatment intolerance or new symptoms.  Did his eye exam.  AVW 04/2017  Diabetes - Last A1c 6.9 (4/23) -complications: peripheral neuropathy -managed by endocrinology  -meds: metformin,Levimir, insulin with meals, asa -eye exam: sees Dr. Idolina Primer -foot exam: utd  Essential hypertension - stable  -meds: norvasc 5, doxasosin 4, lasix 40 - sees cardiology   CAD, Chronic diastolic congestive heart failure - 2-D echo on 08/26/14 showed EF 60-65% with grade 1 diastolic dysfunction.  -cath 2015 30% LM, 40-50%LAD  -meds: asa, statin stopped 06/2017 for leg complaints and elevated CK - managed by his cardiologist, lasix  -cardiologist: Dr. [name]  OAB (overactive bladder) -meds: Cardura -on myrbetriq and vesicare in the past -stable  -[place] urologist, Dr. Patsy Baltimore  GERD: -meds: prilosec  Hyperlipemia - stable  - meds: Pravastatin stopped by cardiology due to leg cramps and elevated CK  Anemia: -during hospital stay 2016 and mild in the past on review cardiology records  -no bleeding    ROS: See pertinent positives and negatives per HPI.  Past Medical History:  Diagnosis Date  . Anemia   . Atherosclerotic PVD with intermittent claudication ([place])   . CAD (coronary artery disease)   . Diabetes mellitus    type 2 with peripheral neuropathy  . Diastolic CHF, chronic ([place])   . Diverticulosis   . DVT (deep venous thrombosis) ([place])   . Dyslipidemia   . Edema 08/19/2014  . GERD (gastroesophageal reflux disease)    hx hiatal hernia   . HTN (hypertension)   . IBS (irritable bowel syndrome)    Hx: of  . Osteoarthritis   . Overactive bladder    Hx: of stress incontinence  . Pneumonia 2002   history of  . Shingles   . Varicose veins of lower extremities with other complications 5/36/1443    Past Surgical History:  Procedure Laterality Date  . BACK SURGERY    . CATARACT EXTRACTION Bilateral 08/10/2015,08/15/2015  . COLONOSCOPY     Hx: of  . CTS bilateral    . KNEE ARTHROSCOPY  1988, 1992   bilateral  . LAMINECTOMY  08/18/2013   DECOMPRESSIVE LAMINECTOMIES  . LEFT AND RIGHT HEART CATHETERIZATION WITH CORONARY ANGIOGRAM N/A 09/03/2014   Procedure: LEFT AND RIGHT HEART CATHETERIZATION WITH CORONARY ANGIOGRAM;  Surgeon: [name], MD;  Location: [facility] CATH LAB;  Service: Cardiovascular;  Laterality: N/A;  . LUMBAR LAMINECTOMY/DECOMPRESSION MICRODISCECTOMY N/A 08/18/2013   Procedure: Lumbar two-three, lumbar three-four, lumbar four-five decompressive laminectomies;  Surgeon: [name], MD;  Location: [place] NEURO ORS;  Service: Neurosurgery;  Laterality: N/A;  . lumbar left arm  1961  . TONSILLECTOMY    . TOTAL KNEE ARTHROPLASTY  2005, 2007  . wrist tendon surgery      Family History  Problem Relation Age of Onset  . Other Mother 41       CABG  . Heart disease Mother   . Hypertension Mother   . Hyperlipidemia Son     Social History   Socioeconomic History  . Marital status: Married    Spouse name:  None  . Number of children: 2  . Years of education: None  . Highest education level: None  Social Needs  . Financial resource strain: None  . Food insecurity - worry: None  . Food insecurity - inability: None  . Transportation needs - medical: None  . Transportation needs - non-medical: None  Occupational History  . None  Tobacco Use  . Smoking status: Never Smoker  . Smokeless tobacco: Never Used  Substance and Sexual Activity  . Alcohol use: No    Alcohol/week: 0.0 oz  . Drug use: No  .  Sexual activity: None  Other Topics Concern  . 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  amLODipine (NORVASC) 5 MG tablet, TAKE ONE TABLET BY MOUTH DAILY, Disp: 90 tablet, Rfl: 1 .  aspirin 81 MG tablet, Take 81 mg by mouth daily. , Disp: , Rfl:  .  B-D UF III MINI PEN NEEDLES 31G X 5 MM MISC, USE AS DIRECTED 4 TIMES A DAY, Disp: 200 each, Rfl: 2 .  doxazosin (CARDURA) 4 MG tablet, TAKE ONE TABLET BY MOUTH DAILY, Disp: 90 tablet, Rfl: 1 .  furosemide (LASIX) 40 MG tablet, TAKE ONE TABLET BY MOUTH DAILY, Disp: 90 tablet, Rfl: 3 .  HUMALOG KWIKPEN 100 UNIT/ML KiwkPen, INJECT 6 TO 10 UNITS BEFORE EACH MEAL 3 TIMES A DAY, Disp: 15 pen, Rfl: 0 .  ibuprofen (ADVIL,MOTRIN) 200 MG tablet, Take 400 mg by mouth 2 (two) times daily., Disp: , Rfl:  .  insulin lispro (HUMALOG KWIKPEN) 100 UNIT/ML KiwkPen, INJECT 4 TO 10 UNITS BEFORE EACH MEAL 3 TIMES A DAY, Disp: 15 pen, Rfl: 2 .  LEVEMIR FLEXTOUCH 100 UNIT/ML Pen, INJECT 20 UNITS INTO THE SKIN DAILY AT 10 PM, Disp: 15 pen, Rfl: 0 .  metFORMIN (GLUCOPHAGE) 1000 MG tablet, Take 1 tablet (1,000 mg total) by mouth 2 (two) times daily with a meal., Disp: 180 tablet, Rfl: 0 .  Multiple Vitamins-Minerals (CENTRUM SILVER PO), Take 1 tablet by mouth daily. , Disp: , Rfl:  .  NON FORMULARY, , Disp: , Rfl:  .  omeprazole (PRILOSEC) 20 MG capsule, TAKE ONE CAPSULE BY MOUTH DAILY, Disp: 90 capsule, Rfl: 1 .  ONETOUCH DELICA LANCETS 63W MISC, USE TO TEST BLOOD SUGAR 3 TIMES A DAY AS DIRECTED, Disp: 200 each, Rfl: 0 .  ONETOUCH DELICA LANCETS 46K MISC, USE TO TEST BLOOD SUGAR 3 TIMES A DAY AS DIRECTED, Disp: 200 each, Rfl: 1 .  ONETOUCH VERIO  test strip, USE TO TEST BLOOD SUGAR THREE TIMES A DAY AS DIRECTED, Disp: 200 each, Rfl: 4 .  OVER THE COUNTER MEDICATION, , Disp: , Rfl:  .  Propylene Glycol (SYSTANE BALANCE) 0.6 % SOLN, Apply to eye. 2 drops each eye once daily, Disp: , Rfl:  .  vitamin B-12 (CYANOCOBALAMIN) 1000 MCG tablet, Take 1,000 mcg by mouth daily., Disp: , Rfl:   EXAM:  Vitals:   12/10/17 0727  BP: 128/80  Pulse: 79  Temp: 97.9 F (36.6 C)    Body mass index is 29.29 kg/m.  GENERAL: vitals reviewed and listed above, alert, oriented, appears well hydrated and in no acute distress  HEENT: atraumatic, conjunttiva clear, no obvious abnormalities on inspection of external nose and ears  NECK: no obvious masses on inspection  LUNGS: clear to auscultation bilaterally, no wheezes, rales or rhonchi, good air movement  CV: HRRR, no peripheral edema  MS: moves all extremities without noticeable abnormality  PSYCH: pleasant and cooperative, no obvious depression or anxiety  ASSESSMENT AND PLAN:  Discussed the following assessment and plan:  Uncontrolled type 2 diabetes mellitus with peripheral circulatory disorder (HCC)  Hyperlipidemia associated with type 2 diabetes mellitus (HCC)  Chronic diastolic congestive heart failure (HCC)  Diabetic peripheral neuropathy associated with type 2 diabetes mellitus ([place])  Hypertension associated with diabetes ([place]) - Plan: Basic metabolic panel, CBC  Elevated CK - Plan: CK  -labs per orders -will recheck CK per his request - advised f/u with his cardiologist and possibly with rheumatology if remains elevated -also advised he inquire about his statin with his cardiologist -assistant to update eye exam -Patient advised to return or notify a doctor immediately if symptoms worsen or persist or new concerns arise.  Patient Instructions  BEFORE YOU LEAVE: -labs -follow up: 3-4 months  Please ask you cardiologist about the statin and follow up on the elevated lab  CK.  We have ordered labs or studies at this visit. It can take up to 1-2 weeks for results and processing. IF results require follow up or explanation, we will call you with instructions. Clinically stable results will be released to your [facility]. If you have not heard from [place] or cannot find your results in [facility] in 2 weeks please contact our office at [phone].  If you are not yet signed up for [facility], please consider signing up.           Colin Benton R., DO

## 2017-12-10 ENCOUNTER — Encounter: Payer: Self-pay | Admitting: Family Medicine

## 2017-12-10 ENCOUNTER — Ambulatory Visit: Payer: Medicare Other | Admitting: Family Medicine

## 2017-12-10 VITALS — BP 128/80 | HR 79 | Temp 97.9°F | Ht 70.0 in | Wt 204.1 lb

## 2017-12-10 DIAGNOSIS — E1165 Type 2 diabetes mellitus with hyperglycemia: Secondary | ICD-10-CM

## 2017-12-10 DIAGNOSIS — E785 Hyperlipidemia, unspecified: Secondary | ICD-10-CM | POA: Diagnosis not present

## 2017-12-10 DIAGNOSIS — I10 Essential (primary) hypertension: Secondary | ICD-10-CM | POA: Diagnosis not present

## 2017-12-10 DIAGNOSIS — E1169 Type 2 diabetes mellitus with other specified complication: Secondary | ICD-10-CM

## 2017-12-10 DIAGNOSIS — E1142 Type 2 diabetes mellitus with diabetic polyneuropathy: Secondary | ICD-10-CM

## 2017-12-10 DIAGNOSIS — IMO0002 Reserved for concepts with insufficient information to code with codable children: Secondary | ICD-10-CM

## 2017-12-10 DIAGNOSIS — E1151 Type 2 diabetes mellitus with diabetic peripheral angiopathy without gangrene: Secondary | ICD-10-CM

## 2017-12-10 DIAGNOSIS — I5032 Chronic diastolic (congestive) heart failure: Secondary | ICD-10-CM

## 2017-12-10 DIAGNOSIS — E1159 Type 2 diabetes mellitus with other circulatory complications: Secondary | ICD-10-CM

## 2017-12-10 DIAGNOSIS — R748 Abnormal levels of other serum enzymes: Secondary | ICD-10-CM

## 2017-12-10 LAB — CBC
HEMATOCRIT: 36 % — AB (ref 39.0–52.0)
Hemoglobin: 11.7 g/dL — ABNORMAL LOW (ref 13.0–17.0)
MCHC: 32.6 g/dL (ref 30.0–36.0)
MCV: 86.7 fl (ref 78.0–100.0)
Platelets: 171 10*3/uL (ref 150.0–400.0)
RBC: 4.15 Mil/uL — ABNORMAL LOW (ref 4.22–5.81)
RDW: 15.5 % (ref 11.5–15.5)
WBC: 7.2 10*3/uL (ref 4.0–10.5)

## 2017-12-10 LAB — BASIC METABOLIC PANEL
BUN: 18 mg/dL (ref 6–23)
CALCIUM: 9 mg/dL (ref 8.4–10.5)
CO2: 29 meq/L (ref 19–32)
CREATININE: 0.78 mg/dL (ref 0.40–1.50)
Chloride: 103 mEq/L (ref 96–112)
GFR: 101.24 mL/min (ref >60.00)
GLUCOSE: 154 mg/dL — AB (ref 70–99)
Potassium: 5 mEq/L (ref 3.5–5.1)
Sodium: 139 mEq/L (ref 135–145)

## 2017-12-10 LAB — CK: CK TOTAL: 193 U/L (ref 7–232)

## 2017-12-10 NOTE — Patient Instructions (Addendum)
BEFORE YOU LEAVE: -labs -follow up: 3-4 months  Please ask you cardiologist about the statin and follow up on the elevated lab CK.  We have ordered labs or studies at this visit. It can take up to 1-2 weeks for results and processing. IF results require follow up or explanation, we will call you with instructions. Clinically stable results will be released to your [facility]. If you have not heard from [place] or cannot find your results in [facility] in 2 weeks please contact our office at [phone].  If you are not yet signed up for [facility], please consider signing up.

## 2017-12-12 ENCOUNTER — Other Ambulatory Visit: Payer: Self-pay | Admitting: Internal Medicine

## 2017-12-13 ENCOUNTER — Telehealth: Payer: Self-pay | Admitting: Cardiology

## 2017-12-13 NOTE — Telephone Encounter (Signed)
Ok will see on follow up

## 2017-12-13 NOTE — Telephone Encounter (Signed)
Pt of Dr. Marcello Moores his PCP Dr. Maudie Mercury 3-4 days ago, she recommended pt be seen for dizziness by cardiology. He reports having dizziness x1 month or so. "I've always had off and on dizzy spells, but nothing like this". reports no other acute symptoms such as shortness of breath or chest pain. He denies falls or syncope. He does report ongoing neck pain, but this is being treated by Dr. Nelva Bush. Advised pt to follow up tomorrow for appt w Dr. Purcell Nails arranged by scheduler, bring in UTD list of meds, call again today if he has any other concerns or questions. He voiced understanding and thanks.  Routed to provider for review/FYI

## 2017-12-13 NOTE — Telephone Encounter (Signed)
New Message  Per pt call requesting to speak with RN. Pt states he went had a physical completed and was told to make a f/u appt. Pt states he has been experiencing dizziness. Pt was made an appt with PA for tomorrow. Please call back to discuss

## 2017-12-14 ENCOUNTER — Encounter: Payer: Self-pay | Admitting: Adult Health

## 2017-12-14 ENCOUNTER — Ambulatory Visit: Payer: Medicare Other | Admitting: Adult Health

## 2017-12-14 VITALS — BP 120/58 | HR 78 | Ht 70.0 in | Wt 206.0 lb

## 2017-12-14 DIAGNOSIS — R55 Syncope and collapse: Secondary | ICD-10-CM | POA: Diagnosis not present

## 2017-12-14 DIAGNOSIS — R42 Dizziness and giddiness: Secondary | ICD-10-CM | POA: Diagnosis not present

## 2017-12-14 DIAGNOSIS — Z79899 Other long term (current) drug therapy: Secondary | ICD-10-CM | POA: Diagnosis not present

## 2017-12-14 MED ORDER — PRAVASTATIN SODIUM 40 MG PO TABS: 40.0000 mg | ORAL_TABLET | Freq: Every evening | ORAL | 3 refills | Status: DC

## 2017-12-14 MED ORDER — AMLODIPINE BESYLATE 5 MG PO TABS: 2.5000 mg | ORAL_TABLET | Freq: Every day | ORAL | 3 refills | Status: DC

## 2017-12-14 MED ORDER — FUROSEMIDE 40 MG PO TABS: 40.0000 mg | ORAL_TABLET | Freq: Every day | ORAL | 3 refills | Status: DC

## 2017-12-14 NOTE — Patient Instructions (Addendum)
Medication Instructions:  STOP LASIX TAKE AS NEEDED FOR SWELLING  START AMLODIPINE 2.5MG  DAILY  START PRAVASTATIN 40MG  DAILY  If you need a refill on your cardiac medications before your next appointment, please call your pharmacy.  Labwork: BMET, TSH, CBC AND MAG  TODAY HERE IN OUR OFFICE AT [facility]  Special Instructions: COME IN FOR BP CHECK 2 WEEKS WITH RPH-TAKE YOUR BP DAILY AND LOG FOR OUR REVIEW.  Your physician has recommended that you wear an event monitor. Event monitors are medical devices that record the heart's electrical activity. Doctors most often [place] these monitors to diagnose arrhythmias. Arrhythmias are problems with the speed or rhythm of the heartbeat. The monitor is a small, portable device. You can wear one while you do your normal daily activities. This is usually used to diagnose what is causing palpitations/syncope (passing out). THIS WILL BE PLACED AT OUR [address] OFFICE.  Follow-Up: Your physician wants you to follow-up:AFTER MONITOR  IN [place], DNP.    Thank you for choosing [facility] at [facility]!!

## 2017-12-14 NOTE — Progress Notes (Signed)
Cardiology Office Note   Date:  12/14/2017   ID:  [name], DOB [birth year], MRN [id number]  PCP:  [name], DO  Cardiologist: Dr. [name] Chief Complaint  Patient presents with  . Dizziness    should he restart Pravastatin     History of Present Illness: [name] is a 84 y.o. male who presents for quested appointment in the setting of recurrent dizziness.  Patient has a history of coronary artery disease, (most recent cardiac catheterization in 2015 revealed 30% left main, 40% to 50% LAD with no obstructive disease in the circumflex or right coronary artery.)  Chronic diastolic CHF, chronic dyspnea on exertion and lower extremity edema.  The patient was last seen in the office by Dr. [name] on 06/28/2017.  At that time patient was asymptomatic and euvolemic.  The patient did complain of mild myalgia type pain follow-up labs were ordered.  Follow-up labs dated 06/28/2017 revealed normal liver functions..  No evidence of anemia thrombocytopenia hypokalemia or renal disease.  Echocardiogram 08/26/2017 Left ventricle: The cavity size was normal. Wall thickness was normal. Systolic function was normal. The estimated ejection fraction was in the range of 60% to 65%. Wall motion was normal; there were no regional wall motion abnormalities. Doppler parameters are consistent with abnormal left ventricular relaxation (grade 1 diastolic dysfunction). The E/e&' ratio is between 8-15, suggesting indeterminate LV filling pressure. - Aortic valve: Mildly calcified leaflets. There was no stenosis. There was no regurgitation. - Mitral valve: Mildly thickened leaflets . There was mild regurgitation. - Left atrium: Moderately dilated at 41 ml/m2.  He comes today with complaints of positional dizziness, he also states when he bends over to get a bottle of water from the refrigerator or to pick up a newspaper from the floor he has near syncope.  He is easily dizzy getting  up out of bed or after sitting for a long period of time.  He is also had one episode of near syncope while having a bowel movement trying to bear down.  States that everything went black and he had to hold on to keep from falling.  He used the bars in the bathroom to assist him in standing.  This has become quite concerning for him and he is requested this appointment.  Past Medical History:  Diagnosis Date  . Anemia   . Atherosclerotic PVD with intermittent claudication ([place])   . CAD (coronary artery disease)   . Diabetes mellitus    type 2 with peripheral neuropathy  . Diastolic CHF, chronic ([place])   . Diverticulosis   . DVT (deep venous thrombosis) ([place])   . Dyslipidemia   . Edema 08/19/2014  . GERD (gastroesophageal reflux disease)    hx hiatal hernia  . HTN (hypertension)   . IBS (irritable bowel syndrome)    Hx: of  . Osteoarthritis   . Overactive bladder    Hx: of stress incontinence  . Pneumonia 2002   history of  . Shingles   . Varicose veins of lower extremities with other complications 9/48/5462    Past Surgical History:  Procedure Laterality Date  . BACK SURGERY    . CATARACT EXTRACTION Bilateral 08/10/2015,08/15/2015  . COLONOSCOPY     Hx: of  . CTS bilateral    . KNEE ARTHROSCOPY  1988, 1992   bilateral  . LAMINECTOMY  08/18/2013   DECOMPRESSIVE LAMINECTOMIES  . LEFT AND RIGHT HEART CATHETERIZATION WITH CORONARY ANGIOGRAM N/A 09/03/2014   Procedure: LEFT AND RIGHT HEART  CATHETERIZATION WITH CORONARY ANGIOGRAM;  Surgeon: [name], MD;  Location: [facility] CATH LAB;  Service: Cardiovascular;  Laterality: N/A;  . LUMBAR LAMINECTOMY/DECOMPRESSION MICRODISCECTOMY N/A 08/18/2013   Procedure: Lumbar two-three, lumbar three-four, lumbar four-five decompressive laminectomies;  Surgeon: [name], MD;  Location: [facility];  Service: Neurosurgery;  Laterality: N/A;  . lumbar left arm  1961  . TONSILLECTOMY    . TOTAL KNEE ARTHROPLASTY  2005, 2007  . wrist tendon  surgery       Current Outpatient Medications  Medication Sig Dispense Refill  . amLODipine (NORVASC) 5 MG tablet TAKE ONE TABLET BY MOUTH DAILY 90 tablet 1  . aspirin 81 MG tablet Take 81 mg by mouth daily.     . B-D UF III MINI PEN NEEDLES 31G X 5 MM MISC USE AS DIRECTED 4 TIMES A DAY 200 each 2  . dimenhyDRINATE (DRAMAMINE) 50 MG tablet Take 50 mg by mouth 2 (two) times daily.    Marland Kitchen doxazosin (CARDURA) 4 MG tablet TAKE ONE TABLET BY MOUTH DAILY 90 tablet 1  . furosemide (LASIX) 40 MG tablet TAKE ONE TABLET BY MOUTH DAILY 90 tablet 3  . ibuprofen (ADVIL,MOTRIN) 200 MG tablet Take 400 mg by mouth 2 (two) times daily.    . insulin lispro (HUMALOG KWIKPEN) 100 UNIT/ML KiwkPen INJECT 4 TO 10 UNITS BEFORE EACH MEAL 3 TIMES A DAY 15 pen 2  . LEVEMIR FLEXTOUCH 100 UNIT/ML Pen INJECT 20 UNITS INTO THE SKIN DAILY AT 10 PM 15 pen 0  . metFORMIN (GLUCOPHAGE) 1000 MG tablet Take 1 tablet (1,000 mg total) by mouth 2 (two) times daily with a meal. 180 tablet 0  . Multiple Vitamins-Minerals (CENTRUM SILVER PO) Take 1 tablet by mouth daily.     Marland Kitchen omeprazole (PRILOSEC) 20 MG capsule TAKE ONE CAPSULE BY MOUTH DAILY 90 capsule 1  . ONETOUCH DELICA LANCETS 95A MISC USE TO TEST BLOOD SUGAR 3 TIMES A DAY AS DIRECTED 200 each 0  . ONETOUCH VERIO test strip USE TO TEST BLOOD SUGAR THREE TIMES A DAY AS DIRECTED 200 each 4  . OVER THE COUNTER MEDICATION     . Propylene Glycol (SYSTANE BALANCE) 0.6 % SOLN Apply to eye. 2 drops each eye once daily    . vitamin B-12 (CYANOCOBALAMIN) 1000 MCG tablet Take 1,000 mcg by mouth daily.     No current facility-administered medications for this visit.     Allergies:   Ciprofloxacin and Sulfamethoxazole    Social History:  The patient  reports that  has never smoked. he has never used smokeless tobacco. He reports that he does not drink alcohol or use drugs.   Family History:  The patient's family history includes Heart disease in his mother; Hyperlipidemia in his son;  Hypertension in his mother; Other (age of onset: 54) in his mother.    ROS: All other systems are reviewed and negative. Unless otherwise mentioned in H&P    PHYSICAL EXAM: VS:  BP (!) 120/58   Pulse 78   Ht 5\' 10"  (1.778 m)   Wt 206 lb (93.4 kg)   BMI 29.56 kg/m  , BMI Body mass index is 29.56 kg/m. GEN: Well nourished, well developed, in no acute distress  HEENT: normal  Neck: no JVD, carotid bruits, or masses Cardiac: RRR; soft systolic murmur murmurs, rubs, or gallops,no edema  Respiratory:  clear to auscultation bilaterally, normal work of breathing GI: soft, nontender, nondistended, + BS MS: no deformity or atrophy  Skin:  warm and dry, no rash Neuro:  Strength and sensation are intact Psych: euthymic mood, full affect   Recent Labs: 06/28/2017: ALT 23 12/10/2017: BUN 18; Creatinine, Ser 0.78; Hemoglobin 11.7; Platelets 171.0; Potassium 5.0; Sodium 139    Lipid Panel    Component Value Date/Time   CHOL 96 05/03/2017 0837   TRIG 64.0 05/03/2017 0837   HDL 39.10 05/03/2017 0837   CHOLHDL 2 05/03/2017 0837   VLDL 12.8 05/03/2017 0837   LDLCALC 44 05/03/2017 0837      Wt Readings from Last 3 Encounters:  12/14/17 206 lb (93.4 kg)  12/10/17 204 lb 1.6 oz (92.6 kg)  08/23/17 205 lb 9.6 oz (93.3 kg)       ASSESSMENT AND PLAN:  1.  Orthostatic hypotension: I asked for orthostatic blood pressures during his office visit.  Lying blood pressure 92/82, heart rate of 61.  48 heart rate of 65; standing 83/45 with a heart rate of 66.  I will discontinue Lasix and have it to be taken as needed only for lower extremity edema.  I will decrease amlodipine to 2.5 mg daily from 5 mg daily.  The patient is advised to get a blood pressure machine at home and record his blood pressures once in the morning and once in the evening.  He will return to the office in 2 weeks for a nurse visit for blood pressure check to compare his home readings with our readings.  2.  Near syncope:  This may be related to #1, but he was also having this occur with bending over and with Valsalva maneuver during bowel movement.  Put a cardiac monitor on him to evaluate his heart rate at home to evaluate for bradycardia.  He is not on any rate reducing AV nodal blocking agents at this time.  3.  Hypercholesterolemia: He is going back to taking pravastatin 40 mg daily.  He was taken off of this temporarily by his primary care as he thought this may be contributing to his symptoms.  As stopping it did not change his symptoms he has restarted it.   Current medicines are reviewed at length with the patient today.    Labs/ tests ordered today include: TSH, CBC, magnesium, and BMET. Phill Myron. [place] Pugh, ANP, AACC   12/14/2017 12:05 PM    [facility] [address], [place], [place] [address] Phone: [phone]; Fax: [phone]

## 2017-12-15 LAB — BASIC METABOLIC PANEL
BUN / CREAT RATIO: 16 (ref 10–24)
BUN: 16 mg/dL (ref 8–27)
CHLORIDE: 101 mmol/L (ref 96–106)
CO2: 21 mmol/L (ref 20–29)
Calcium: 9.2 mg/dL (ref 8.6–10.2)
Creatinine, Ser: 0.99 mg/dL (ref 0.76–1.27)
GFR calc Af Amer: 82 mL/min/{1.73_m2} (ref >59)
GFR calc non Af Amer: 71 mL/min/{1.73_m2} (ref >59)
Glucose: 115 mg/dL — ABNORMAL HIGH (ref 65–99)
POTASSIUM: 5 mmol/L (ref 3.5–5.2)
Sodium: 141 mmol/L (ref 134–144)

## 2017-12-15 LAB — CBC
Hematocrit: 35.5 % — ABNORMAL LOW (ref 37.5–51.0)
Hemoglobin: 11.5 g/dL — ABNORMAL LOW (ref 13.0–17.7)
MCH: 27.6 pg (ref 26.6–33.0)
MCHC: 32.4 g/dL (ref 31.5–35.7)
MCV: 85 fL (ref 79–97)
PLATELETS: 215 10*3/uL (ref 150–379)
RBC: 4.16 x10E6/uL (ref 4.14–5.80)
RDW: 15.7 % — AB (ref 12.3–15.4)
WBC: 6.4 10*3/uL (ref 3.4–10.8)

## 2017-12-15 LAB — TSH: TSH: 1.55 u[IU]/mL (ref 0.450–4.500)

## 2017-12-15 LAB — MAGNESIUM: MAGNESIUM: 1.4 mg/dL — AB (ref 1.6–2.3)

## 2017-12-17 ENCOUNTER — Telehealth: Payer: Self-pay | Admitting: Cardiology

## 2017-12-17 ENCOUNTER — Other Ambulatory Visit: Payer: Self-pay

## 2017-12-17 MED ORDER — MAGNESIUM 200 MG PO TABS: 200.0000 mg | ORAL_TABLET | Freq: Every day | ORAL | 11 refills | Status: AC

## 2017-12-20 NOTE — Telephone Encounter (Signed)
Closed Encounter  °

## 2017-12-24 ENCOUNTER — Ambulatory Visit (INDEPENDENT_AMBULATORY_CARE_PROVIDER_SITE_OTHER): Payer: Medicare Other

## 2017-12-24 DIAGNOSIS — R42 Dizziness and giddiness: Secondary | ICD-10-CM | POA: Diagnosis not present

## 2017-12-24 DIAGNOSIS — R55 Syncope and collapse: Secondary | ICD-10-CM

## 2017-12-27 ENCOUNTER — Ambulatory Visit: Payer: Medicare Other | Admitting: Internal Medicine

## 2017-12-27 ENCOUNTER — Encounter: Payer: Self-pay | Admitting: Internal Medicine

## 2017-12-27 VITALS — BP 132/64 | HR 88 | Ht 70.0 in | Wt 204.2 lb

## 2017-12-27 DIAGNOSIS — Z794 Long term (current) use of insulin: Secondary | ICD-10-CM | POA: Diagnosis not present

## 2017-12-27 DIAGNOSIS — E1142 Type 2 diabetes mellitus with diabetic polyneuropathy: Secondary | ICD-10-CM | POA: Diagnosis not present

## 2017-12-27 DIAGNOSIS — E1169 Type 2 diabetes mellitus with other specified complication: Secondary | ICD-10-CM | POA: Diagnosis not present

## 2017-12-27 DIAGNOSIS — E785 Hyperlipidemia, unspecified: Secondary | ICD-10-CM

## 2017-12-27 LAB — POCT GLYCOSYLATED HEMOGLOBIN (HGB A1C): Hemoglobin A1C: 7.1

## 2017-12-27 MED ORDER — GLUCOSE BLOOD VI STRP: ORAL_STRIP | 4 refills | Status: DC

## 2017-12-27 NOTE — Addendum Note (Signed)
Addended by: Drucilla Schmidt on: 12/27/2017 11:20 AM   Modules accepted: Orders

## 2017-12-27 NOTE — Progress Notes (Signed)
Subjective:     Patient ID: [name], male   DOB: 01-02-1933, 84 y.o.   MRN: [id number]  HPI Mr. Crookshanks is a very pleasant 84 y.o. man returning for management of DM2, dx 07/2012, uncontrolled, insulin-dependent, with complications (CAD, peripheral neuropathy, PAD). Last visit 4 months ago.  He is still using a walker after his fall in 09/2015, he continues to have weakness in his legs and his equilibrium is poor.  He completed physical therapy but does not feel that it helped he is seeing Dr. Nelva Bush. Cervical MRI: OA >> got 1 steroid inj and will have another one soon.   He had dizziness and orthostasis >> Amlodipine dose decreased and Lasix stopped.  Last HbA1C was: Lab Results  Component Value Date   HGBA1C 6.9 08/23/2017   HGBA1C 7.0 04/18/2017   HGBA1C 6.7 01/17/2017   He is on: - Metformin 1000 mg 2x a day - Levemir 20 units at bedtime. - Humalog 15 min before a meal:  4 units before a small meal (use 4 units before b'fast) 6 units before a regular meal 8-10 units before a large meal Please subtract 2 units if you plan to be active after a meal.  Patient brings a good log - checks 2x a day, which we reviewed together: - am:  74, 80-120 >> 70-125 >> 75, 88, 90-128, 138 - 2h after b'fast: 120-160 >> 70-145 >> 130-158 - before lunch:  91-125 >> 70-146, 150 >> 117-148, 168 - 2h after lunch:  97-125 >> 132, 140 >> 135-160 - dinner: 81-140, 160 x1 >> 80-145 >> 120-160 (snack) - 2h after dinner: 119-150 >> 82-140 >> 140-180 - bedtime: 88, 110-132 >> 84-150, 160 >>124-170 - nighttime: 115-160 >> 90-133 >> 72, 90-152 >> 104-162 Has hypoglycemia awareness in the 70s.  -No CKD. Last BUN/Cr: Lab Results  Component Value Date   BUN 16 12/14/2017   CREATININE 0.99 12/14/2017  Not on ACE inhibitor/ARB. -+ HL; last lipid panel: Lab Results  Component Value Date   CHOL 96 05/03/2017   HDL 39.10 05/03/2017   LDLCALC 44 05/03/2017   TRIG 64.0 05/03/2017   CHOLHDL 2  05/03/2017  He is on pravastatin - restarted 2 weeks ago. -He has peripheral neuropathy.  He is on -B complex -Compounded neuropathy cream) Sees Dr. Barkley Bruns at [facility]. - Last eye exam 10/2017: No DR no DR (Dr. Alois Cliche). Had Cataract Sx in 08/2015.  He has macular degeneration >> on Preservision Areds vitamins. On ASA 81.  He had a heart catheterization on 09/03/2014 >> 2 small Blockages >> no intervention other than statins. 2D ECHO with normal EF. In 08/2015 he was hospitalized for ?sepsis with strep pneumoniae after acquiring left lower lobe pneumonia.  He has a heart monitor now.  Review of Systems Constitutional: no weight gain/no weight loss, + fatigue, no subjective hyperthermia, no subjective hypothermia Eyes: no blurry vision, no xerophthalmia ENT: no sore throat, no nodules palpated in throat, no dysphagia, no odynophagia, no hoarseness Cardiovascular: no CP/+ SOB/no palpitations/+ leg swelling Respiratory: no cough/+ SOB/no wheezing Gastrointestinal: no N/no V/no D/no C/no acid reflux Musculoskeletal: + muscle aches/+ joint aches Skin: no rashes, no hair loss Neurological: no tremors/no numbness/no tingling/no dizziness, + HA  I reviewed pt's medications, allergies, PMH, social hx, family hx, and changes were documented in the history of present illness. Otherwise, unchanged from my initial visit note.  Objective:   Physical Exam BP 132/64   Pulse 88  Ht 5\' 10"  (1.778 m)   Wt 204 lb 3.2 oz (92.6 kg)   SpO2 95%   BMI 29.30 kg/m  Body mass index is 29.3 kg/m.  Wt Readings from Last 3 Encounters:  12/27/17 204 lb 3.2 oz (92.6 kg)  12/14/17 206 lb (93.4 kg)  12/10/17 204 lb 1.6 oz (92.6 kg)   Constitutional: overweight, in NAD, walks with cane Eyes: PERRLA, EOMI, no exophthalmos ENT: moist mucous membranes, no thyromegaly, no cervical lymphadenopathy Cardiovascular: RRR, No MRG Respiratory: CTA B Gastrointestinal: abdomen soft, NT, ND,  BS+ Musculoskeletal: no deformities, strength intact in all 4 Skin: moist, warm, no rashes Neurological: no tremor with outstretched hands, DTR normal in all 4  Assessment:     1. DM2, insulin-dependent, uncontrolled, with complications; target A1P for him: 7-7.5% - CAD - peripheral neuropathy - PAD Dr. [name]  2. PN - 2/2 DM  3. HL  Plan:     1. Pt with long-standing, uncontrolled, type 2 diabetes, insulin-dependent, improved in last year.  We did not change his regimen at last visit as his sugars were at or close to goal.  I did advise him to stay with a 6 unit Humalog dose before meals, rather than taking 8 units since he had some sugars in the 70s. - at this visit, sugars are slightly higher, but still close to goal. No lows >> continue current regimen - I advised him to: Patient Instructions  Please continue: - Metformin 1000 mg 2x a day - Levemir 20 units at bedtime. - Humalog 15 min before a meal:  4 units before a small meal (use 4 units before b'fast) 6 units before a regular meal 8-10 units before a large meal Please subtract 2 units if you plan to be active after a meal.  Please come back for a follow-up appointment in 4 months.  - today, HbA1c is 7.1% (slightly higher) - continue checking sugars at different times of the day - check 3x a day, rotating checks - advised for yearly eye exams >> he is UTD - Return to clinic in 4 mo with sugar log    2. PN - 2/2 DM2 - sees [name] - Uses B complex daily and neuropathy cream as needed  3. HL - Last lipid panel at goal in 04/2017 - He is back on pravastatin.  No side effects.  Philemon Kingdom, MD PhD [facility] Endocrinology

## 2017-12-27 NOTE — Patient Instructions (Addendum)
Please continue: - Metformin 1000 mg 2x a day - Levemir 20 units at bedtime. - Humalog 15 min before a meal:  4 units before a small meal (use 4 units before b'fast) 6 units before a regular meal 8-10 units before a large meal Please subtract 2 units if you plan to be active after a meal.  Please come back for a follow-up appointment in 4 months.

## 2017-12-28 ENCOUNTER — Other Ambulatory Visit: Payer: Self-pay | Admitting: Internal Medicine

## 2017-12-31 ENCOUNTER — Encounter: Payer: Self-pay | Admitting: Pharmacist Clinician (PhC)/ Clinical Pharmacy Specialist

## 2017-12-31 ENCOUNTER — Ambulatory Visit: Payer: Medicare Other | Admitting: Pharmacist Clinician (PhC)/ Clinical Pharmacy Specialist

## 2017-12-31 DIAGNOSIS — E1159 Type 2 diabetes mellitus with other circulatory complications: Secondary | ICD-10-CM

## 2017-12-31 DIAGNOSIS — I10 Essential (primary) hypertension: Secondary | ICD-10-CM

## 2017-12-31 DIAGNOSIS — I152 Hypertension secondary to endocrine disorders: Secondary | ICD-10-CM

## 2017-12-31 LAB — MAGNESIUM: MAGNESIUM: 1.6 mg/dL (ref 1.6–2.3)

## 2017-12-31 LAB — BASIC METABOLIC PANEL
BUN / CREAT RATIO: 17 (ref 10–24)
BUN: 15 mg/dL (ref 8–27)
CHLORIDE: 100 mmol/L (ref 96–106)
CO2: 25 mmol/L (ref 20–29)
Calcium: 9.5 mg/dL (ref 8.6–10.2)
Creatinine, Ser: 0.9 mg/dL (ref 0.76–1.27)
GFR calc non Af Amer: 79 mL/min/{1.73_m2} (ref >59)
GFR, EST AFRICAN AMERICAN: 92 mL/min/{1.73_m2} (ref >59)
GLUCOSE: 154 mg/dL — AB (ref 65–99)
POTASSIUM: 5.2 mmol/L (ref 3.5–5.2)
Sodium: 139 mmol/L (ref 134–144)

## 2017-12-31 NOTE — Progress Notes (Signed)
12/31/2017 Nichael Ehly Pinkham 11/12/1933 [id number]   HPI:  RODERIC LAMMERT is a 84 y.o. male patient of Dr [name], with a PMH below who presents today for hypertension clinic evaluation. In addition to hypertension, his medical history is significant for CAD, diastolic heart failure, hyperlipidemia and DM (with some peripheral neuropathy).  Patient notes that he has considerably less dizziness in general and after bowel movements since last visit on 12/14/17 where Dr. Purcell Nails PA started magnesium 200 mg tablets daily, decreased his amlodipine from 5 mg to 2.5 mg daily, and decreased his furosemide to as needed for swelling.  He returns today for a follow up visit.  He has not had any need for the furosemide since that visit.  He does note that his blood pressures still run high some days.    Blood Pressure Goal:  < 130/80 (Due to hypertension and diabetes guidelines)  Current Medications: Amlodipine 2.5 mg daily  Doxazosin 4 mg daily Furosemide 40 mg as needed for swelling  Family Hx: Mother had hypertension, open heart surgery, and several TIAs No other significant family history was noted. Social Hx: Patient does not drink alcohol or smoke. Diet: Patient does not use much salt in his food. Exercise: Patient cannot exercise due to his legs feeling like they are about to give out frequently. Home BP readings: Patient's average home blood pressure readings at 10 am are 135/62 (based on 16 readings) and at 4 pm are 148/75   (based on 13 readings)   Estimated Creatinine Clearance: 72.3 mL/min (by C-G formula based on SCr of 0.9 mg/dL).  Wt Readings from Last 3 Encounters:  12/27/17 204 lb 3.2 oz (92.6 kg)  12/14/17 206 lb (93.4 kg)  12/10/17 204 lb 1.6 oz (92.6 kg)   BP Readings from Last 3 Encounters:  12/27/17 132/64  12/14/17 (!) 120/58  12/10/17 128/80   Pulse Readings from Last 3 Encounters:  12/27/17 88  12/14/17 78  12/10/17 79    Current Outpatient Medications    Medication Sig Dispense Refill  . amLODipine (NORVASC) 5 MG tablet Take 0.5 tablets (2.5 mg total) by mouth daily. 30 tablet 3  . aspirin 81 MG tablet Take 81 mg by mouth daily.     . B-D UF III MINI PEN NEEDLES 31G X 5 MM MISC USE AS DIRECTED 4 TIMES A DAY 200 each 2  . dimenhyDRINATE (DRAMAMINE) 50 MG tablet Take 50 mg by mouth 2 (two) times daily.    Marland Kitchen doxazosin (CARDURA) 4 MG tablet TAKE ONE TABLET BY MOUTH DAILY 90 tablet 1  . furosemide (LASIX) 40 MG tablet Take 1 tablet (40 mg total) by mouth daily. ONLY FOR PRN SWELLING 90 tablet 3  . glucose blood (ONETOUCH VERIO) test strip USE TO TEST BLOOD SUGAR THREE TIMES A DAY AS DIRECTED 250 each 4  . ibuprofen (ADVIL,MOTRIN) 200 MG tablet Take 400 mg by mouth 2 (two) times daily.    . insulin lispro (HUMALOG KWIKPEN) 100 UNIT/ML KiwkPen INJECT 4 TO 10 UNITS BEFORE EACH MEAL 3 TIMES A DAY 15 pen 2  . LEVEMIR FLEXTOUCH 100 UNIT/ML Pen INJECT 20 UNITS INTO THE SKIN DAILY AT 10 PM 15 pen 0  . Magnesium 200 MG TABS Take 1 tablet (200 mg total) by mouth daily. 30 each 11  . metFORMIN (GLUCOPHAGE) 1000 MG tablet TAKE ONE TABLET BY MOUTH TWICE A DAY WITH A MEAL 180 tablet 0  . Multiple Vitamins-Minerals (CENTRUM SILVER PO) Take 1 tablet  by mouth daily.     Marland Kitchen omeprazole (PRILOSEC) 20 MG capsule TAKE ONE CAPSULE BY MOUTH DAILY 90 capsule 1  . ONETOUCH DELICA LANCETS 10G MISC USE TO TEST BLOOD SUGAR 3 TIMES A DAY AS DIRECTED 200 each 0  . OVER THE COUNTER MEDICATION     . pravastatin (PRAVACHOL) 40 MG tablet Take 1 tablet (40 mg total) by mouth every evening. 90 tablet 3  . Propylene Glycol (SYSTANE BALANCE) 0.6 % SOLN Apply to eye. 2 drops each eye once daily    . vitamin B-12 (CYANOCOBALAMIN) 1000 MCG tablet Take 1,000 mcg by mouth daily.     No current facility-administered medications for this visit.     Allergies  Allergen Reactions  . Ciprofloxacin Other (See Comments)    "made me have a weird feeling. Disoriented."   . Sulfamethoxazole Rash     Past Medical History:  Diagnosis Date  . Anemia   . Atherosclerotic PVD with intermittent claudication ([place])   . CAD (coronary artery disease)   . Diabetes mellitus    type 2 with peripheral neuropathy  . Diastolic CHF, chronic ([place])   . Diverticulosis   . DVT (deep venous thrombosis) ([place])   . Dyslipidemia   . Edema 08/19/2014  . GERD (gastroesophageal reflux disease)    hx hiatal hernia  . HTN (hypertension)   . IBS (irritable bowel syndrome)    Hx: of  . Osteoarthritis   . Overactive bladder    Hx: of stress incontinence  . Pneumonia 2002   history of  . Shingles   . Varicose veins of lower extremities with other complications 2/69/4854   Blood pressure in office:  Supine 122/68  HR 68  Seated 110/60  HR 68  Standing 102/58  HR 72  Hypertension associated with diabetes ([place]) Patient with hypertension as well as orthostatic hypotension at times.  Reports doing better with his medication changes, although still having some dizziness and unsteadiness with positional changes.  Today there was a 20 point change from supine to standing in the office.  Because of this, I am going to discontinue the doxazosin, as it has potential to cause orthostasis.  He has no problems with his prostate, has only been using it for hypertension.   Will have him increase the amlodipine back to 5 mg once daily.  He will repeat his BMET and magnesium levels today and we will see him in 3-4 weeks for follow up.     Tommy Medal PharmD CPP [facility] [address] [place], [place] [address] [phone]

## 2017-12-31 NOTE — Patient Instructions (Signed)
Return for a a follow up appointment in 3-4 weeks  Your blood pressure today is  110/60 (seated)  Check your blood pressure at home daily and keep record of the readings.  Take your BP meds as follows:  Stop doxazosin  Increase amlodipine back to 5 mg once daily  Bring all of your meds, your BP cuff and your record of home blood pressures to your next appointment.  Exercise as you're able, try to walk approximately 30 minutes per day.  Keep salt intake to a minimum, especially watch canned and prepared boxed foods.  Eat more fresh fruits and vegetables and fewer canned items.  Avoid eating in fast food restaurants.    HOW TO TAKE YOUR BLOOD PRESSURE: . Rest 5 minutes before taking your blood pressure. .  Don't smoke or drink caffeinated beverages for at least 30 minutes before. . Take your blood pressure before (not after) you eat. . Sit comfortably with your back supported and both feet on the floor (don't cross your legs). . Elevate your arm to heart level on a table or a desk. . Use the proper sized cuff. It should fit smoothly and snugly around your bare upper arm. There should be enough room to slip a fingertip under the cuff. The bottom edge of the cuff should be 1 inch above the crease of the elbow. . Ideally, take 3 measurements at one sitting and record the average.

## 2017-12-31 NOTE — Assessment & Plan Note (Signed)
Patient with hypertension as well as orthostatic hypotension at times.  Reports doing better with his medication changes, although still having some dizziness and unsteadiness with positional changes.  Today there was a 20 point change from supine to standing in the office.  Because of this, I am going to discontinue the doxazosin, as it has potential to cause orthostasis.  He has no problems with his prostate, has only been using it for hypertension.   Will have him increase the amlodipine back to 5 mg once daily.  He will repeat his BMET and magnesium levels today and we will see him in 3-4 weeks for follow up.

## 2018-01-01 ENCOUNTER — Encounter: Payer: Self-pay | Admitting: *Deleted

## 2018-01-15 ENCOUNTER — Other Ambulatory Visit: Payer: Self-pay | Admitting: Internal Medicine

## 2018-01-22 ENCOUNTER — Ambulatory Visit: Payer: Medicare Other | Admitting: Pharmacist

## 2018-01-22 ENCOUNTER — Encounter: Payer: Self-pay | Admitting: Pharmacist

## 2018-01-22 VITALS — BP 98/50 | HR 68

## 2018-01-22 DIAGNOSIS — I10 Essential (primary) hypertension: Secondary | ICD-10-CM

## 2018-01-22 DIAGNOSIS — E1159 Type 2 diabetes mellitus with other circulatory complications: Secondary | ICD-10-CM

## 2018-01-22 DIAGNOSIS — I152 Hypertension secondary to endocrine disorders: Secondary | ICD-10-CM

## 2018-01-22 MED ORDER — AMLODIPINE BESYLATE 5 MG PO TABS: 5.0000 mg | ORAL_TABLET | Freq: Every day | ORAL | 3 refills | Status: DC

## 2018-01-22 NOTE — Progress Notes (Signed)
HPI:  [name] is a 84 y.o. male patient of Dr [name], with a PMH below who presents today for hypertension clinic evaluation. In addition to hypertension, his medical history is significant for CAD, diastolic heart failure, hyperlipidemia and DM (with some peripheral neuropathy).  Patient notes that he has considerably less dizziness in general. During last pharmacist OV his doxazosin was discontinued and amlodipine dose was increased to 5mg  daily.  He returns today for a follow up visit.  He reports taking furosemide daily for the last 5 days.  Blood Pressure Goal:  < 130/80 (Due to hypertension and diabetes guidelines)  Current Medications: Amlodipine 5 mg daily  Furosemide 40 mg daily (ordered only PRN)  Family History: Mother had hypertension, open heart surgery, and several TIAs No other significant family history was noted.  Social History: Patient does not drink alcohol or smoke.  Diet: Patient does not use much salt in his food.  Exercise: Patient cannot exercise due to his legs feeling like they are about to give out frequently.  Home BP readings: 22 readings; average 122/68 (pulse 54-66 bpm)  Wt Readings from Last 3 Encounters:  12/27/17 204 lb 3.2 oz (92.6 kg)  12/14/17 206 lb (93.4 kg)  12/10/17 204 lb 1.6 oz (92.6 kg)   BP Readings from Last 3 Encounters:  01/22/18 (!) 98/50  12/27/17 132/64  12/14/17 (!) 120/58   Pulse Readings from Last 3 Encounters:  01/22/18 68  12/27/17 88  12/14/17 78    Current Outpatient Medications  Medication Sig Dispense Refill  . amLODipine (NORVASC) 5 MG tablet Take 1 tablet (5 mg total) by mouth daily. 30 tablet 3  . aspirin 81 MG tablet Take 81 mg by mouth daily.     . B-D UF III MINI PEN NEEDLES 31G X 5 MM MISC USE AS DIRECTED 4 TIMES A DAY 200 each 2  . furosemide (LASIX) 40 MG tablet Take 1 tablet (40 mg total) by mouth daily. ONLY FOR PRN SWELLING 90 tablet 3  . glucose blood (ONETOUCH VERIO) test strip  USE TO TEST BLOOD SUGAR THREE TIMES A DAY AS DIRECTED 250 each 4  . HUMALOG KWIKPEN 100 UNIT/ML KiwkPen INJECT 6 TO 10 UNITS BEFORE EACH MEAL 3 TIMES A DAY 15 pen 0  . insulin lispro (HUMALOG KWIKPEN) 100 UNIT/ML KiwkPen INJECT 4 TO 10 UNITS BEFORE EACH MEAL 3 TIMES A DAY 15 pen 2  . LEVEMIR FLEXTOUCH 100 UNIT/ML Pen INJECT 20 UNITS INTO THE SKIN DAILY AT 10 PM 15 pen 0  . Magnesium 200 MG TABS Take 1 tablet (200 mg total) by mouth daily. 30 each 11  . meclizine (ANTIVERT) 25 MG tablet Take 25 mg by mouth 2 (two) times daily.    . metFORMIN (GLUCOPHAGE) 1000 MG tablet TAKE ONE TABLET BY MOUTH TWICE A DAY WITH A MEAL 180 tablet 0  . Multiple Vitamins-Minerals (CENTRUM SILVER PO) Take 1 tablet by mouth daily.     . naproxen sodium (ALEVE) 220 MG tablet Take 220 mg by mouth 2 (two) times daily with a meal.    . omeprazole (PRILOSEC) 20 MG capsule TAKE ONE CAPSULE BY MOUTH DAILY 90 capsule 1  . ONETOUCH DELICA LANCETS 49Q MISC USE TO TEST BLOOD SUGAR 3 TIMES A DAY AS DIRECTED 200 each 0  . OVER THE COUNTER MEDICATION     . pravastatin (PRAVACHOL) 40 MG tablet Take 1 tablet (40 mg total) by mouth every evening. 90 tablet 3  .  Propylene Glycol (SYSTANE BALANCE) 0.6 % SOLN Apply to eye. 2 drops each eye once daily    . vitamin B-12 (CYANOCOBALAMIN) 1000 MCG tablet Take 1,000 mcg by mouth daily.     No current facility-administered medications for this visit.     Allergies  Allergen Reactions  . Ciprofloxacin Other (See Comments)    "made me have a weird feeling. Disoriented."   . Sulfamethoxazole Rash    Past Medical History:  Diagnosis Date  . Anemia   . Atherosclerotic PVD with intermittent claudication ([place])   . CAD (coronary artery disease)   . Diabetes mellitus    type 2 with peripheral neuropathy  . Diastolic CHF, chronic ([place])   . Diverticulosis   . DVT (deep venous thrombosis) ([place])   . Dyslipidemia   . Edema 08/19/2014  . GERD (gastroesophageal reflux disease)    hx hiatal  hernia  . HTN (hypertension)   . IBS (irritable bowel syndrome)    Hx: of  . Osteoarthritis   . Overactive bladder    Hx: of stress incontinence  . Pneumonia 2002   history of  . Shingles   . Varicose veins of lower extremities with other complications 1/85/6314    Hypertension associated with diabetes ([place]) Blood pressure is well controlled at home with an average reading of 122/68. Also well controlled today in office with reading of 118/60. Noted BP drops to 98/50 upon standing but patient remains asymptomatic. He reports significant improvement in dizziness but started taking furosemide 40mg  daily about 1 week ago.  Noted patient has good BP control few weeks before starting furosemide 40mg  daily but may be experiencing fluid depletion today. Will continue amlodipine 5mg  daily, and change furosemide back to 40mg  ONLY as needed for edema. Patient will follow up with NP in 2 weeks as previously schedule and repeat BMET during that appointment.    Malaisha Silliman Rodriguez-Guzman PharmD, BCPS, [place] [address] [place] [address] 01/22/2018 8:06 PM

## 2018-01-22 NOTE — Assessment & Plan Note (Addendum)
Blood pressure is well controlled at home with an average reading of 122/68. Also well controlled today in office with reading of 118/60. Noted BP drops to 98/50 upon standing but patient remains asymptomatic. He reports significant improvement in dizziness but started taking furosemide 40mg  daily about 1 week ago.  Noted patient has good BP control few weeks before starting furosemide 40mg  daily but may be experiencing fluid depletion today. Will continue amlodipine 5mg  daily, and change furosemide back to 40mg  ONLY as needed for edema. Patient will follow up with NP in 1 week as previously schedule and repeat BMET during that appointment.

## 2018-01-22 NOTE — Patient Instructions (Signed)
Return for a follow up appointment as needed  Check your blood pressure at home daily (if able) and keep record of the readings.  Take your BP meds as follows: *TAKE furosemide 40mg  only as needed*  Bring all of your meds, your BP cuff and your record of home blood pressures to your next appointment.  Exercise as you're able, try to walk approximately 30 minutes per day.  Keep salt intake to a minimum, especially watch canned and prepared boxed foods.  Eat more fresh fruits and vegetables and fewer canned items.  Avoid eating in fast food restaurants.    HOW TO TAKE YOUR BLOOD PRESSURE: . Rest 5 minutes before taking your blood pressure. .  Don't smoke or drink caffeinated beverages for at least 30 minutes before. . Take your blood pressure before (not after) you eat. . Sit comfortably with your back supported and both feet on the floor (don't cross your legs). . Elevate your arm to heart level on a table or a desk. . Use the proper sized cuff. It should fit smoothly and snugly around your bare upper arm. There should be enough room to slip a fingertip under the cuff. The bottom edge of the cuff should be 1 inch above the crease of the elbow. . Ideally, take 3 measurements at one sitting and record the average.

## 2018-01-26 NOTE — Progress Notes (Signed)
Cardiology Office Note   Date:  01/28/2018   ID:  [name], DOB [birth year], MRN [id number]  PCP:  [name], DO  Cardiologist:  Dr. [name]  Chief Complaint  Patient presents with  . Follow-up    6 weeks  . Headache    Some  . Edema    Legs.  . Shortness of Breath     History of Present Illness: [name] is a 84 y.o. male who presents for ongoing assessment and management of dizziness, with history of CAD (recent cardiac catheterization 2015 revealing left main at 30%, LAD at 40% to 50% with no obstructive disease in the circumflex or right coronary artery).  Other history includes chronic diastolic heart failure, dyspnea on exertion, chronic lower extremity edema.  He was last seen in our office on 12/14/2017 complaining of positional dizziness especially when he bends over to get a bottle of water from the refrigerator, or to pick up a newspaper.  He also had one episode of near syncope after having had a bowel movement after trying to bear down.  The patient had orthostatic blood pressures checked in the office at that time and he was found to be orthostatic.  I discontinued Lasix to be taken only as needed for lower extremity edema, and decreased amlodipine to 2.5 mg from 5 mg daily.  His syncope was thought to be related to orthostatic hypotension.  I also placed a cardiac monitor on the patient due to bradycardia, and not on any AV nodal blocking agents.  He was subsequently seen by our Pharm.D. [name], blood pressure was generally better reading around 122/68, but he still remains slightly hypotensive with position changes to 98/51 standing.  He was asymptomatic.  At that time he was continued on amlodipine 5 mg daily and changed back to furosemide as needed only for edema.  He comes today feeling much better dizzy and lightheaded. Blood pressure recording sheet she was essentially stable blood pressures, with occasionally rare hypotension one recording  84/48 and other 95/51, and another 86/59 after standing up. Rate ranges from 54-74.  If main complaint today is generalized fatigue. With known history of coronary artery disease he is concerned that he is having progression.  Past Medical History:  Diagnosis Date  . Anemia   . Atherosclerotic PVD with intermittent claudication ([place])   . CAD (coronary artery disease)   . Diabetes mellitus    type 2 with peripheral neuropathy  . Diastolic CHF, chronic ([place])   . Diverticulosis   . DVT (deep venous thrombosis) ([place])   . Dyslipidemia   . Edema 08/19/2014  . GERD (gastroesophageal reflux disease)    hx hiatal hernia  . HTN (hypertension)   . IBS (irritable bowel syndrome)    Hx: of  . Osteoarthritis   . Overactive bladder    Hx: of stress incontinence  . Pneumonia 2002   history of  . Shingles   . Varicose veins of lower extremities with other complications 9/89/2119    Past Surgical History:  Procedure Laterality Date  . BACK SURGERY    . CATARACT EXTRACTION Bilateral 08/10/2015,08/15/2015  . COLONOSCOPY     Hx: of  . CTS bilateral    . KNEE ARTHROSCOPY  1988, 1992   bilateral  . LAMINECTOMY  08/18/2013   DECOMPRESSIVE LAMINECTOMIES  . LEFT AND RIGHT HEART CATHETERIZATION WITH CORONARY ANGIOGRAM N/A 09/03/2014   Procedure: LEFT AND RIGHT HEART CATHETERIZATION WITH CORONARY ANGIOGRAM;  Surgeon: Juanda Bond  Burt Knack, MD;  Location[facility] CATH LAB;  Service: Cardiovascular;  Laterality: N/A;  . LUMBAR LAMINECTOMY/DECOMPRESSION MICRODISCECTOMY N/A 08/18/2013   Procedure: Lumbar two-three, lumbar three-four, lumbar four-five decompressive laminectomies;  Surgeon: [name], MD;  Location: [facility] NEURO ORS;  Service: Neurosurgery;  Laterality: N/A;  . lumbar left arm  1961  . TONSILLECTOMY    . TOTAL KNEE ARTHROPLASTY  2005, 2007  . wrist tendon surgery       Current Outpatient Medications  Medication Sig Dispense Refill  . amLODipine (NORVASC) 5 MG tablet Take 1 tablet (5 mg total)  by mouth daily. 30 tablet 3  . aspirin 81 MG tablet Take 81 mg by mouth daily.     . B-D UF III MINI PEN NEEDLES 31G X 5 MM MISC USE AS DIRECTED 4 TIMES A DAY 200 each 2  . furosemide (LASIX) 40 MG tablet Take 1 tablet (40 mg total) by mouth daily. ONLY FOR PRN SWELLING 90 tablet 3  . glucose blood (ONETOUCH VERIO) test strip USE TO TEST BLOOD SUGAR THREE TIMES A DAY AS DIRECTED 250 each 4  . HUMALOG KWIKPEN 100 UNIT/ML KiwkPen INJECT 6 TO 10 UNITS BEFORE EACH MEAL 3 TIMES A DAY 15 pen 0  . LEVEMIR FLEXTOUCH 100 UNIT/ML Pen INJECT 20 UNITS INTO THE SKIN DAILY AT 10 PM 15 pen 0  . Magnesium 200 MG TABS Take 1 tablet (200 mg total) by mouth daily. 30 each 11  . meclizine (ANTIVERT) 25 MG tablet Take 25 mg by mouth 2 (two) times daily.    . metFORMIN (GLUCOPHAGE) 1000 MG tablet TAKE ONE TABLET BY MOUTH TWICE A DAY WITH A MEAL 180 tablet 0  . Multiple Vitamins-Minerals (CENTRUM SILVER PO) Take 1 tablet by mouth daily.     . naproxen sodium (ALEVE) 220 MG tablet Take 220 mg by mouth 2 (two) times daily with a meal.    . omeprazole (PRILOSEC) 20 MG capsule TAKE ONE CAPSULE BY MOUTH DAILY 90 capsule 1  . ONETOUCH DELICA LANCETS 57Q MISC USE TO TEST BLOOD SUGAR 3 TIMES A DAY AS DIRECTED 200 each 0  . OVER THE COUNTER MEDICATION     . pravastatin (PRAVACHOL) 40 MG tablet Take 1 tablet (40 mg total) by mouth every evening. 90 tablet 3  . Propylene Glycol (SYSTANE BALANCE) 0.6 % SOLN Apply to eye. 2 drops each eye once daily    . vitamin B-12 (CYANOCOBALAMIN) 1000 MCG tablet Take 1,000 mcg by mouth daily.     No current facility-administered medications for this visit.     Allergies:   Ciprofloxacin and Sulfamethoxazole    Social History:  The patient  reports that  has never smoked. he has never used smokeless tobacco. He reports that he does not drink alcohol or use drugs.   Family History:  The patient's family history includes Heart disease in his mother; Hyperlipidemia in his son; Hypertension  in his mother; Other (age of onset: 42) in his mother.    ROS: All other systems are reviewed and negative. Unless otherwise mentioned in H&P    PHYSICAL EXAM: VS:  BP 128/72   Pulse 72   Ht 5\' 10"  (1.778 m)   Wt 203 lb (92.1 kg)   BMI 29.13 kg/m  , BMI Body mass index is 29.13 kg/m. GEN: Well nourished, well developed, in no acute distress  HEENT: normal  Neck: no JVD, carotid bruits, or masses Cardiac: RRR; no murmurs, rubs, or gallops,no edema  Respiratory:  clear to auscultation bilaterally, normal work of breathing GI: soft, nontender, nondistended, + BS MS: no deformity or atrophy  Skin: warm and dry, no rash Neuro:  Strength and sensation are intact Psych: euthymic mood, full affect  Recent Labs: 06/28/2017: ALT 23 12/14/2017: Hemoglobin 11.5; Platelets 215; TSH 1.550 12/31/2017: BUN 15; Creatinine, Ser 0.90; Magnesium 1.6; Potassium 5.2; Sodium 139    Lipid Panel    Component Value Date/Time   CHOL 96 05/03/2017 0837   TRIG 64.0 05/03/2017 0837   HDL 39.10 05/03/2017 0837   CHOLHDL 2 05/03/2017 0837   VLDL 12.8 05/03/2017 0837   LDLCALC 44 05/03/2017 0837      Wt Readings from Last 3 Encounters:  01/28/18 203 lb (92.1 kg)  12/27/17 204 lb 3.2 oz (92.6 kg)  12/14/17 206 lb (93.4 kg)    Other studies Reviewed: Study Conclusions-Echocardiogram 08/26/2014  - Left ventricle: The cavity size was normal. Wall thickness was normal. Systolic function was normal. The estimated ejection fraction was in the range of 60% to 65%. Wall motion was normal; there were no regional wall motion abnormalities. Doppler parameters are consistent with abnormal left ventricular relaxation (grade 1 diastolic dysfunction). The E/e&' ratio is between 8-15, suggesting indeterminate LV filling pressure. - Aortic valve: Mildly calcified leaflets. There was no stenosis. There was no regurgitation. - Mitral valve: Mildly thickened leaflets . There was  mild regurgitation. - Left atrium: Moderately dilated at 41 ml/m2.  Impressions:  - LVEF 60-65%, mild LVH, mildly calcified aortic valve without stenosis, mild MR, moderate LAE, diastolic dysfunction, indeterminate LV filling pressure.  ASSESSMENT AND PLAN:  1.  Coronary artery disease: Patient is having progressive fatigue, he denies any chest pain or dyspnea. I'm going to schedule him for a Lexiscan Myoview for ongoing assessment of his CAD for evidence of progression. I have explained this to the patient and his wife, reviewed the procedure, and they're willing to proceed. As he is diabetic, he is requesting that he has the first available in the morning to avoid hypoglycemia.   2. Hypercholesterolemia: Patient will continue statin therapy as directed.  3. Insulin-dependent diabetes: I've asked him to hold his insulin in a.m. before stress test as he will be nothing by mouth, and continue his usual regimen after he begins to eat.  4. Orthostatic hypotension: The patient is feeling much better with medication adjustments and only using Lasix when necessary area he has had some low blood pressures on his recording sheet, if I am seeing a more pronounced trend of hypotension decrease amlodipine to 2.5 mg. Current medicines are reviewed at length with the patient today.    Labs/ tests ordered today include: Lexiscan Myoview  Phill Myron. West Pugh, ANP, AACC   01/28/2018 9:03 AM    [facility] [address] [place], [place] [address] Phone: [phone]; Fax: [phone]

## 2018-01-28 ENCOUNTER — Encounter: Payer: Self-pay | Admitting: Adult Health

## 2018-01-28 ENCOUNTER — Ambulatory Visit: Payer: Medicare Other | Admitting: Adult Health

## 2018-01-28 VITALS — BP 128/72 | HR 72 | Ht 70.0 in | Wt 203.0 lb

## 2018-01-28 DIAGNOSIS — R42 Dizziness and giddiness: Secondary | ICD-10-CM | POA: Diagnosis not present

## 2018-01-28 DIAGNOSIS — I5032 Chronic diastolic (congestive) heart failure: Secondary | ICD-10-CM

## 2018-01-28 DIAGNOSIS — I2510 Atherosclerotic heart disease of native coronary artery without angina pectoris: Secondary | ICD-10-CM | POA: Diagnosis not present

## 2018-01-28 DIAGNOSIS — R5383 Other fatigue: Secondary | ICD-10-CM

## 2018-01-28 DIAGNOSIS — I2583 Coronary atherosclerosis due to lipid rich plaque: Secondary | ICD-10-CM

## 2018-01-28 DIAGNOSIS — R0602 Shortness of breath: Secondary | ICD-10-CM | POA: Diagnosis not present

## 2018-01-28 NOTE — Patient Instructions (Signed)
Medication Instructions:  NO CHANGES-Your physician recommends that you continue on your current medications as directed. Please refer to the Current Medication list given to you today.  If you need a refill on your cardiac medications before your next appointment, please call your pharmacy.  Testing/Procedures: Your physician has requested that you have a [place] AM.A cardiac stress test is a cardiological test that measures the heart's ability to respond to external stress in a controlled clinical environment. The stress response is induced byintravenous pharmacological stimulation. For further information please visit [facility]uge[employer]iest[facility]tn. Please follow instruction sheet, as given. PLEASE HOLD INSULIN AND LASIX THE MORNING OF THE PROCEDURE  Follow-Up: Your physician wants you to follow-up in: 2 Reynolds, DNP.    Thank you for choosing [facility] at [facility]!!

## 2018-01-29 ENCOUNTER — Telehealth (HOSPITAL_COMMUNITY): Payer: Self-pay

## 2018-01-29 NOTE — Telephone Encounter (Signed)
Encounter complete. 

## 2018-02-01 ENCOUNTER — Ambulatory Visit (HOSPITAL_COMMUNITY)
Admission: RE | Admit: 2018-02-01 | Discharge: 2018-02-01 | Disposition: A | Discharge status: Home / Self Care | Payer: Medicare Other | Source: Ambulatory Visit | Attending: Cardiology | Admitting: Cardiology

## 2018-02-01 DIAGNOSIS — R5383 Other fatigue: Secondary | ICD-10-CM

## 2018-02-01 DIAGNOSIS — R0602 Shortness of breath: Secondary | ICD-10-CM

## 2018-02-01 DIAGNOSIS — I2583 Coronary atherosclerosis due to lipid rich plaque: Secondary | ICD-10-CM

## 2018-02-01 DIAGNOSIS — I2510 Atherosclerotic heart disease of native coronary artery without angina pectoris: Secondary | ICD-10-CM | POA: Diagnosis not present

## 2018-02-01 DIAGNOSIS — I5032 Chronic diastolic (congestive) heart failure: Secondary | ICD-10-CM | POA: Diagnosis present

## 2018-02-01 LAB — MYOCARDIAL PERFUSION IMAGING
CHL CUP NUCLEAR SDS: 7
CHL CUP RESTING HR STRESS: 56 {beats}/min
LV dias vol: 103 mL (ref 62–150)
LV sys vol: 46 mL
Peak HR: 81 {beats}/min
SRS: 0
SSS: 7
TID: 0.87

## 2018-02-01 MED ORDER — REGADENOSON 0.4 MG/5ML IV SOLN
0.4000 mg | Freq: Once | INTRAVENOUS | Status: AC
  Administered 2018-02-01: 0.4 mg via INTRAVENOUS

## 2018-02-01 MED ORDER — TECHNETIUM TC 99M TETROFOSMIN IV KIT
29.0000 | PACK | Freq: Once | INTRAVENOUS | Status: AC | PRN
  Administered 2018-02-01: 29 via INTRAVENOUS
  Filled 2018-02-01: qty 29

## 2018-02-01 MED ORDER — TECHNETIUM TC 99M TETROFOSMIN IV KIT
10.9000 | PACK | Freq: Once | INTRAVENOUS | Status: AC | PRN
  Administered 2018-02-01: 10.9 via INTRAVENOUS
  Filled 2018-02-01: qty 11

## 2018-02-06 ENCOUNTER — Other Ambulatory Visit: Payer: Self-pay | Admitting: Internal Medicine

## 2018-02-13 NOTE — Progress Notes (Signed)
Cardiology Office Note   Date:  02/14/2018   ID:  [name], DOB [birth year], MRN [id number]  PCP:  Bobby Kern, DO  Cardiologist:  Dr. Stanford [name] Chief Complaint  Patient presents with  . Coronary Artery Disease    no new issues  . Hypertension     History of Present Illness: [name] is a 84 y.o. male who presents for presents for ongoing assessment and management of dizziness, with history of CAD (recent cardiac catheterization 2015 revealing left main at 30%, LAD at 40% to 50% with no obstructive disease in the circumflex or right coronary artery).  Other history includes chronic diastolic heart failure, dyspnea on exertion, chronic lower extremity edema.  He was last seen in the office on 01/28/2018 complaining of dizziness when bending over to pick bottle water refrigerator when he picked up a newspaper. He also had complained of near syncopal episode after having had a BM or bearing down. He was discontinued to be taken when necessary, and decreased amlodipine 2.5 mg from 5 mg daily. Cardiac monitor did not reveal significant bradycardia or pauses patient had PVCs and 3 beat run of NSVT.   The patient had a nuclear medicine stress test dated 02/01/2018 which was negative for ischemia. Also on last visit, the patient was feeling much better, not having further episodes of near syncope.  On follow-up today he is feeling great, no more dizziness. His being completed today is arthritis pain in his cervical spine which she is being followed by neurosurgery and PCP. He is recently been started on tramadol area fifth that he has been helpful but does not entirely eliminate the pain. He is not interested in narcotics.  Past Medical History:  Diagnosis Date  . Anemia   . Atherosclerotic PVD with intermittent claudication ([place])   . CAD (coronary artery disease)   . Diabetes mellitus    type 2 with peripheral neuropathy  . Diastolic CHF, chronic ([place])   . Diverticulosis   . DVT  (deep venous thrombosis) ([place])   . Dyslipidemia   . Edema 08/19/2014  . GERD (gastroesophageal reflux disease)    hx hiatal hernia  . HTN (hypertension)   . IBS (irritable bowel syndrome)    Hx: of  . Osteoarthritis   . Overactive bladder    Hx: of stress incontinence  . Pneumonia 2002   history of  . Shingles   . Varicose veins of lower extremities with other complications 9/93/7169    Past Surgical History:  Procedure Laterality Date  . BACK SURGERY    . CATARACT EXTRACTION Bilateral 08/10/2015,08/15/2015  . COLONOSCOPY     Hx: of  . CTS bilateral    . KNEE ARTHROSCOPY  1988, 1992   bilateral  . LAMINECTOMY  08/18/2013   DECOMPRESSIVE LAMINECTOMIES  . LEFT AND RIGHT HEART CATHETERIZATION WITH CORONARY ANGIOGRAM N/A 09/03/2014   Procedure: LEFT AND RIGHT HEART CATHETERIZATION WITH CORONARY ANGIOGRAM;  Surgeon: Bobby Ohara, MD;  Location: [facility] CATH LAB;  Service: Cardiovascular;  Laterality: N/A;  . LUMBAR LAMINECTOMY/DECOMPRESSION MICRODISCECTOMY N/A 08/18/2013   Procedure: Lumbar two-three, lumbar three-four, lumbar four-five decompressive laminectomies;  Surgeon: Bobby Charter, MD;  Location: [place] NEURO ORS;  Service: Neurosurgery;  Laterality: N/A;  . lumbar left arm  1961  . TONSILLECTOMY    . TOTAL KNEE ARTHROPLASTY  2005, 2007  . wrist tendon surgery       Current Outpatient Medications  Medication Sig Dispense Refill  . amLODipine (NORVASC) 5  MG tablet Take 1 tablet (5 mg total) by mouth daily. 30 tablet 3  . aspirin 81 MG tablet Take 81 mg by mouth daily.     . B-D UF III MINI PEN NEEDLES 31G X 5 MM MISC USE AS DIRECTED FOUR TIMES A DAY 200 each 1  . furosemide (LASIX) 40 MG tablet Take 1 tablet (40 mg total) by mouth daily. ONLY FOR PRN SWELLING 90 tablet 3  . glucose blood (ONETOUCH VERIO) test strip USE TO TEST BLOOD SUGAR THREE TIMES A DAY AS DIRECTED 250 each 4  . HUMALOG KWIKPEN 100 UNIT/ML KiwkPen INJECT 6 TO 10 UNITS BEFORE EACH MEAL 3 TIMES A DAY 15 pen 0    . LEVEMIR FLEXTOUCH 100 UNIT/ML Pen INJECT 20 UNITS INTO THE SKIN DAILY AT 10 PM 15 pen 0  . Magnesium 200 MG TABS Take 1 tablet (200 mg total) by mouth daily. 30 each 11  . meclizine (ANTIVERT) 25 MG tablet Take 25 mg by mouth 2 (two) times daily.    . metFORMIN (GLUCOPHAGE) 1000 MG tablet TAKE ONE TABLET BY MOUTH TWICE A DAY WITH A MEAL 180 tablet 0  . Multiple Vitamins-Minerals (CENTRUM SILVER PO) Take 1 tablet by mouth daily.     . naproxen sodium (ALEVE) 220 MG tablet Take 220 mg by mouth 2 (two) times daily with a meal.    . omeprazole (PRILOSEC) 20 MG capsule TAKE ONE CAPSULE BY MOUTH DAILY 90 capsule 1  . ONETOUCH DELICA LANCETS 22L MISC USE TO TEST BLOOD SUGAR 3 TIMES A DAY AS DIRECTED 200 each 0  . OVER THE COUNTER MEDICATION     . pravastatin (PRAVACHOL) 40 MG tablet Take 1 tablet (40 mg total) by mouth every evening. 90 tablet 3  . Propylene Glycol (SYSTANE BALANCE) 0.6 % SOLN Apply to eye. 2 drops each eye once daily    . traMADol (ULTRAM) 50 MG tablet Take 1 tablet (50 mg total) by mouth every 6 (six) hours as needed. 30 tablet   . vitamin B-12 (CYANOCOBALAMIN) 1000 MCG tablet Take 1,000 mcg by mouth daily.     No current facility-administered medications for this visit.     Allergies:   Ciprofloxacin and Sulfamethoxazole    Social History:  The patient  reports that  has never smoked. he has never used smokeless tobacco. He reports that he does not drink alcohol or use drugs.   Family History:  The patient's family history includes Heart disease in his mother; Hyperlipidemia in his son; Hypertension in his mother; Other (age of onset: 37) in his mother.    ROS: All other systems are reviewed and negative. Unless otherwise mentioned in H&P    PHYSICAL EXAM: VS:  BP 130/63 (BP Location: Left Arm)   Pulse 84   Wt 202 lb 9.6 oz (91.9 kg)   SpO2 98%   BMI 29.07 kg/m  , BMI Body mass index is 29.07 kg/m. GEN: Well nourished, well developed, in no acute distress  HEENT:  normal  Neck: no JVD, carotid bruits, or masses Cardiac: RRR 2/6 systolic murmur, heard best at LSB No, rubs, or gallops,no edema  Respiratory:  clear to auscultation bilaterally, normal work of breathing GI: soft, nontender, nondistended, + BS MS: no deformity or atrophy  Skin: warm and dry, no rash Neuro:  Strength and sensation are intact Psych: euthymic mood, full affect   Recent Labs: 06/28/2017: ALT 23 12/14/2017: Hemoglobin 11.5; Platelets 215; TSH 1.550 12/31/2017: BUN 15; Creatinine, Ser 0.90;  Magnesium 1.6; Potassium 5.2; Sodium 139    Lipid Panel    Component Value Date/Time   CHOL 96 05/03/2017 0837   TRIG 64.0 05/03/2017 0837   HDL 39.10 05/03/2017 0837   CHOLHDL 2 05/03/2017 0837   VLDL 12.8 05/03/2017 0837   LDLCALC 44 05/03/2017 0837      Wt Readings from Last 3 Encounters:  02/14/18 202 lb 9.6 oz (91.9 kg)  02/01/18 203 lb (92.1 kg)  01/28/18 203 lb (92.1 kg)      Other studies Reviewed: Echocardiogram 14-Sep-2014 Left ventricle: The cavity size was normal. Wall thickness was normal. Systolic function was normal. The estimated ejection fraction was in the range of 60% to 65%. Wall motion was normal; there were no regional wall motion abnormalities. Doppler parameters are consistent with abnormal left ventricular relaxation (grade 1 diastolic dysfunction). The E/e&' ratio is between 8-15, suggesting indeterminate LV filling pressure. - Aortic valve: Mildly calcified leaflets. There was no stenosis. There was no regurgitation. - Mitral valve: Mildly thickened leaflets . There was mild regurgitation. - Left atrium: Moderately dilated at 41 ml/m2.  ASSESSMENT AND PLAN:  1.  Hypertension: Low pressures doing very well now without hypotension on medication adjustments on prior office visits. He seems to be doing significantly better concerning as well. Will not make any medication changes at this time.   2. Dizziness: Resolved with decrease  antihypertensive medication along with removal of daily Lasix. He is to take Lasix when necessary only now. Has not had to take it.   3.Chronic Neck Pain: Is being seen by neurologist and PCP. Tramadol which she states helps him. He is considering acupuncture. He is not interested any narcotic pain control.   Current medicines are reviewed at length with the patient today.    Labs/ tests ordered today include: None  Phill Myron. West Pugh, ANP, AACC   02/14/2018 7:55 AM    [facility] [address], [place], [place] [address] Phone: [phone]; Fax: [phone]

## 2018-02-14 ENCOUNTER — Ambulatory Visit: Payer: Medicare Other | Admitting: Adult Health

## 2018-02-14 ENCOUNTER — Other Ambulatory Visit: Payer: Self-pay | Admitting: Family Medicine

## 2018-02-14 ENCOUNTER — Encounter: Payer: Self-pay | Admitting: Adult Health

## 2018-02-14 VITALS — BP 130/63 | HR 84 | Wt 202.6 lb

## 2018-02-14 DIAGNOSIS — I358 Other nonrheumatic aortic valve disorders: Secondary | ICD-10-CM

## 2018-02-14 DIAGNOSIS — R42 Dizziness and giddiness: Secondary | ICD-10-CM | POA: Diagnosis not present

## 2018-02-14 DIAGNOSIS — I10 Essential (primary) hypertension: Secondary | ICD-10-CM | POA: Diagnosis not present

## 2018-02-14 MED ORDER — TRAMADOL HCL 50 MG PO TABS: 50.0000 mg | ORAL_TABLET | Freq: Four times a day (QID) | ORAL | Status: DC | PRN

## 2018-02-14 NOTE — Patient Instructions (Signed)
Medication Instructions:  NO CHANGES- Your physician recommends that you continue on your current medications as directed. Please refer to the Current Medication list given to you today.  If you need a refill on your cardiac medications before your next appointment, please call your pharmacy.  Follow-Up: Your physician wants you to follow-up in: [place] should receive a reminder letter in the mail two months in advance. If you do not receive a letter, please call our office 06-2018 to schedule the 08-2018 follow-up appointment.   Thank you for choosing [facility] at [facility]!!

## 2018-02-19 ENCOUNTER — Other Ambulatory Visit: Payer: Self-pay | Admitting: Family Medicine

## 2018-03-08 ENCOUNTER — Other Ambulatory Visit: Payer: Self-pay | Admitting: Internal Medicine

## 2018-03-10 ENCOUNTER — Other Ambulatory Visit: Payer: Self-pay | Admitting: Family Medicine

## 2018-03-11 NOTE — Telephone Encounter (Signed)
Refill med 

## 2018-03-17 NOTE — Progress Notes (Signed)
HPI:  Using dictation device. Unfortunately this device frequently misinterprets words/phrases.  AVW 04/2017, due for labs  [name] is a pleasant 84 y.o. here for follow up. Chronic medical problems summarized below were reviewed for changes.  Reports he is doing well for the most part.  His cardiologist changed up some of his blood pressure medications.  He is back on a statin and has tolerated it well.  He does still struggle with some neck pain.  He is seeing Dr. Dossie Der for this.  Treatments there did not seem to help.  He also saw several neurosurgeons, but they told him surgery probably would not help.  He is interested in pursuing osteopathic treatments or acupuncture for this.  He occasionally has some tingling in all the fingers of both hands after he holds something for a while.  Denies any weakness, malaise, paresthesias elsewhere. Denies hypoglycemia, CP, SOB, DOE, treatment intolerance or new symptoms.  Diabetes - Last K2I 7.1 0/97 -complications: peripheral neuropathy -managed by endocrinology  -meds: metformin,Levimir, insulin with meals, asa -eye exam: sees Dr. Idolina Primer -foot exam: utd  Essential hypertensionw/ diabetes - stable  -meds: norvasc, doxasosin, lasix - sees cardiology   CAD, Chronic diastolic congestive heart failure - 2-D echo on 08/26/14 showed EF 60-65% with grade 1 diastolic dysfunction -chronic LE edema  -cath 2015 30% LM, 40-50%LAD -nuclear medicine stress test 01/2018, neg for ischemia per cardiology notes  -meds: asa, statinstopped 06/2017 for leg complaints and elevated CK, then restarted - managed by his cardiologist, lasix  -cardiologist: Dr. [name]  OAB (overactive bladder) -meds: Cardura -on myrbetriq and vesicare in the past -stable  -[place] urologist, Dr. Patsy [place]  GERD: -meds: prilosec  Hyperlipemia - stable  - meds: Pravastatin stopped by cardiology due to leg cramps and elevated CK - then  restarted  Anemia: -during hospital stay 2016 and mild in the past on review cardiology records  -no bleeding   Chronic neck pain: -sees Dr. Nelva Bush, [facility], for management -meds: tramadol   ROS: See pertinent positives and negatives per HPI.  Past Medical History:  Diagnosis Date  . Anemia   . Atherosclerotic PVD with intermittent claudication ([place])   . CAD (coronary artery disease)   . Diabetes mellitus    type 2 with peripheral neuropathy  . Diastolic CHF, chronic ([place])   . Diverticulosis   . DVT (deep venous thrombosis) ([place])   . Dyslipidemia   . Edema 08/19/2014  . GERD (gastroesophageal reflux disease)    hx hiatal hernia  . HTN (hypertension)   . IBS (irritable bowel syndrome)    Hx: of  . Osteoarthritis   . Overactive bladder    Hx: of stress incontinence  . Pneumonia 2002   history of  . Shingles   . Varicose veins of lower extremities with other complications 3/53/2992    Past Surgical History:  Procedure Laterality Date  . BACK SURGERY    . CATARACT EXTRACTION Bilateral 08/10/2015,08/15/2015  . COLONOSCOPY     Hx: of  . CTS bilateral    . KNEE ARTHROSCOPY  1988, 1992   bilateral  . LAMINECTOMY  08/18/2013   DECOMPRESSIVE LAMINECTOMIES  . LEFT AND RIGHT HEART CATHETERIZATION WITH CORONARY ANGIOGRAM N/A 09/03/2014   Procedure: LEFT AND RIGHT HEART CATHETERIZATION WITH CORONARY ANGIOGRAM;  Surgeon: [name], MD;  Location: [facility] CATH LAB;  Service: Cardiovascular;  Laterality: N/A;  . LUMBAR LAMINECTOMY/DECOMPRESSION MICRODISCECTOMY N/A 08/18/2013   Procedure: Lumbar two-three, lumbar three-four, lumbar four-five decompressive  laminectomies;  Surgeon: [name], MD;  Location: [place] NEURO ORS;  Service: Neurosurgery;  Laterality: N/A;  . lumbar left arm  1961  . TONSILLECTOMY    . TOTAL KNEE ARTHROPLASTY  2005, 2007  . wrist tendon surgery      Family History  Problem Relation Age of Onset  . Other Mother 28       CABG  . Heart disease  Mother   . Hypertension Mother   . Hyperlipidemia Son     SOCIAL HX: See HPI   Current Outpatient Medications:  .  acetaminophen (TYLENOL) 650 MG CR tablet, Take 650 mg by mouth as needed for pain., Disp: , Rfl:  .  amLODipine (NORVASC) 5 MG tablet, TAKE ONE TABLET BY MOUTH DAILY, Disp: 90 tablet, Rfl: 3 .  aspirin 81 MG tablet, Take 81 mg by mouth daily. , Disp: , Rfl:  .  B-D UF III MINI PEN NEEDLES 31G X 5 MM MISC, USE AS DIRECTED FOUR TIMES A DAY, Disp: 200 each, Rfl: 1 .  furosemide (LASIX) 40 MG tablet, Take 1 tablet (40 mg total) by mouth daily. ONLY FOR PRN SWELLING, Disp: 90 tablet, Rfl: 3 .  glucose blood (ONETOUCH VERIO) test strip, USE TO TEST BLOOD SUGAR THREE TIMES A DAY AS DIRECTED, Disp: 250 each, Rfl: 4 .  HUMALOG KWIKPEN 100 UNIT/ML KiwkPen, INJECT 6 TO 10 UNITS BEFORE EACH MEAL THREE TIMES A DAY, Disp: 15 pen, Rfl: 0 .  LEVEMIR FLEXTOUCH 100 UNIT/ML Pen, INJECT 20 UNITS INTO THE SKIN DAILY AT 10 PM, Disp: 15 pen, Rfl: 0 .  Magnesium 200 MG TABS, Take 1 tablet (200 mg total) by mouth daily., Disp: 30 each, Rfl: 11 .  meclizine (ANTIVERT) 25 MG tablet, Take 25 mg by mouth 2 (two) times daily., Disp: , Rfl:  .  metFORMIN (GLUCOPHAGE) 1000 MG tablet, TAKE ONE TABLET BY MOUTH TWICE A DAY WITH A MEAL, Disp: 180 tablet, Rfl: 0 .  Multiple Vitamins-Minerals (CENTRUM SILVER PO), Take 1 tablet by mouth daily. , Disp: , Rfl:  .  omeprazole (PRILOSEC) 20 MG capsule, TAKE ONE CAPSULE BY MOUTH DAILY, Disp: 90 capsule, Rfl: 1 .  ONETOUCH DELICA LANCETS 40J MISC, USE TO TEST BLOOD SUGAR 3 TIMES A DAY AS DIRECTED, Disp: 200 each, Rfl: 0 .  OVER THE COUNTER MEDICATION, , Disp: , Rfl:  .  Propylene Glycol (SYSTANE BALANCE) 0.6 % SOLN, Apply to eye. 2 drops each eye once daily, Disp: , Rfl:  .  vitamin B-12 (CYANOCOBALAMIN) 1000 MCG tablet, Take 1,000 mcg by mouth daily., Disp: , Rfl:  .  pravastatin (PRAVACHOL) 40 MG tablet, Take 1 tablet (40 mg total) by mouth every evening., Disp: 90  tablet, Rfl: 3  EXAM:  Vitals:   03/18/18 0807  BP: 136/70  Pulse: 65  Temp: 98.3 F (36.8 C)    Body mass index is 28.15 kg/m.  GENERAL: vitals reviewed and listed above, alert, oriented, appears well hydrated and in no acute distress  HEENT: atraumatic, conjunttiva clear, no obvious abnormalities on inspection of external nose and ears  NECK: no obvious masses on inspection  LUNGS: clear to auscultation bilaterally, no wheezes, rales or rhonchi, good air movement  CV: HRRR, tr edema  MS: moves all extremities without noticeable abnormality, normal strength throughout in the upper extremities bilaterally, normal sensitivity to light touch bilaterally in the upper extremities, negative Tinel's  PSYCH: pleasant and cooperative, no obvious depression or anxiety  ASSESSMENT AND PLAN:  Discussed the following assessment and plan:  Neck pain, chronic  Paresthesias - Plan: Vitamin B12  Uncontrolled type 2 diabetes mellitus with peripheral circulatory disorder (HCC)  Diabetic peripheral neuropathy associated with type 2 diabetes mellitus ([place])  Type 2 diabetes mellitus with diabetic polyneuropathy, with long-term current use of insulin ([place])  Hypertension associated with diabetes ([place]) - Plan: Basic metabolic panel, CBC  Hyperlipidemia associated with type 2 diabetes mellitus ([place])  Chronic diastolic congestive heart failure ([place])  -Sees endocrinology for his diabetes -Sees cardiology for heart issues, blood pressure and cholesterol -Discussed other treatment options for chronic neck pain and he will consider osteopathic treatments, is going to check with his insurance, and also acupuncture.  He reports his specialist did recommend acupuncture. -We will check a B12 level with his labs.  Looks like he had a thyroid checked recently.  Also advised consideration of a cockup wrist splint applied loosely at night.  He may have mild carpal tunnel syndrome. -He reports he has  his wellness visit scheduled for May and can do fasting lipids then. -Lifestyle recommendations -Follow-up with me in about 4 months, sooner as needed if he decides to do osteopathic treatments for the neck  Patient Instructions  BEFORE YOU LEAVE: -Labs -follow up: Follow-up as scheduled for annual wellness visit with Manuela Schwartz in May or June; follow-up with Dr. Maudie Mercury in about 3-4 months  You can check on the osteopathic treatments with your insurance, call to schedule if you decide to do this.  We have ordered labs or studies at this visit. It can take up to 1-2 weeks for results and processing. IF results require follow up or explanation, we will call you with instructions. Clinically stable results will be released to your [facility]. If you have not heard from [place] or cannot find your results in [facility] in 2 weeks please contact our office at [phone].  If you are not yet signed up for [facility], please consider signing up.         We recommend the following healthy lifestyle for LIFE: 1) Small portions. But, make sure to get regular (at least 3 per day), healthy meals and small healthy snacks if needed.  2) Eat a healthy clean diet.   TRY TO EAT: -at least 5-7 servings of low sugar, colorful, and nutrient rich vegetables per day (not corn, potatoes or bananas.) -berries are the best choice if you wish to eat fruit (only eat small amounts if trying to reduce weight)  -lean meets (fish, white meat of chicken or [place]) -vegan proteins for some meals - beans or tofu, whole grains, nuts and seeds -Replace bad fats with good fats - good fats include: fish, nuts and seeds, canola oil, olive oil -small amounts of low fat or non fat dairy -small amounts of100 % whole grains - check the lables -drink plenty of water  AVOID: -SUGAR, sweets, anything with added sugar, corn syrup or sweeteners - must read labels as even foods advertised as "healthy" often are loaded with sugar -if you must have  a sweetener, small amounts of stevia may be best -sweetened beverages and artificially sweetened beverages -simple starches (rice, bread, potatoes, pasta, chips, etc - small amounts of 100% whole grains are ok) -red meat, pork, butter -fried foods, fast food, processed food, excessive dairy, eggs and coconut.  3)Get at least 150 minutes of sweaty aerobic exercise per week.  4)Reduce stress - consider counseling, meditation and relaxation to balance other aspects of your life.  Marjie Chea R Biran Mayberry, DO   

## 2018-03-18 ENCOUNTER — Encounter: Payer: Self-pay | Admitting: Family Medicine

## 2018-03-18 ENCOUNTER — Ambulatory Visit: Payer: Medicare Other | Admitting: Family Medicine

## 2018-03-18 ENCOUNTER — Telehealth: Payer: Self-pay | Admitting: *Deleted

## 2018-03-18 VITALS — BP 136/70 | HR 65 | Temp 98.3°F | Ht 70.0 in | Wt 196.2 lb

## 2018-03-18 DIAGNOSIS — I10 Essential (primary) hypertension: Secondary | ICD-10-CM | POA: Diagnosis not present

## 2018-03-18 DIAGNOSIS — E785 Hyperlipidemia, unspecified: Secondary | ICD-10-CM

## 2018-03-18 DIAGNOSIS — E1169 Type 2 diabetes mellitus with other specified complication: Secondary | ICD-10-CM | POA: Diagnosis not present

## 2018-03-18 DIAGNOSIS — E1159 Type 2 diabetes mellitus with other circulatory complications: Secondary | ICD-10-CM

## 2018-03-18 DIAGNOSIS — Z794 Long term (current) use of insulin: Secondary | ICD-10-CM | POA: Diagnosis not present

## 2018-03-18 DIAGNOSIS — M542 Cervicalgia: Secondary | ICD-10-CM

## 2018-03-18 DIAGNOSIS — E1165 Type 2 diabetes mellitus with hyperglycemia: Secondary | ICD-10-CM

## 2018-03-18 DIAGNOSIS — I152 Hypertension secondary to endocrine disorders: Secondary | ICD-10-CM

## 2018-03-18 DIAGNOSIS — R202 Paresthesia of skin: Secondary | ICD-10-CM

## 2018-03-18 DIAGNOSIS — I5032 Chronic diastolic (congestive) heart failure: Secondary | ICD-10-CM | POA: Diagnosis not present

## 2018-03-18 DIAGNOSIS — G8929 Other chronic pain: Secondary | ICD-10-CM | POA: Diagnosis not present

## 2018-03-18 DIAGNOSIS — E1151 Type 2 diabetes mellitus with diabetic peripheral angiopathy without gangrene: Secondary | ICD-10-CM | POA: Diagnosis not present

## 2018-03-18 DIAGNOSIS — E1142 Type 2 diabetes mellitus with diabetic polyneuropathy: Secondary | ICD-10-CM

## 2018-03-18 DIAGNOSIS — IMO0002 Reserved for concepts with insufficient information to code with codable children: Secondary | ICD-10-CM

## 2018-03-18 LAB — CBC
HCT: 38.9 % — ABNORMAL LOW (ref 39.0–52.0)
Hemoglobin: 12.7 g/dL — ABNORMAL LOW (ref 13.0–17.0)
MCHC: 32.7 g/dL (ref 30.0–36.0)
MCV: 88.1 fl (ref 78.0–100.0)
Platelets: 225 10*3/uL (ref 150.0–400.0)
RBC: 4.42 Mil/uL (ref 4.22–5.81)
RDW: 14.6 % (ref 11.5–15.5)
WBC: 9.1 10*3/uL (ref 4.0–10.5)

## 2018-03-18 LAB — BASIC METABOLIC PANEL
BUN: 17 mg/dL (ref 6–23)
CALCIUM: 9.6 mg/dL (ref 8.4–10.5)
CO2: 28 mEq/L (ref 19–32)
Chloride: 102 mEq/L (ref 96–112)
Creatinine, Ser: 0.84 mg/dL (ref 0.40–1.50)
GFR: 92.88 mL/min (ref >60.00)
Glucose, Bld: 46 mg/dL — CL (ref 70–99)
Potassium: 5.1 mEq/L (ref 3.5–5.1)
Sodium: 138 mEq/L (ref 135–145)

## 2018-03-18 LAB — VITAMIN B12

## 2018-03-18 NOTE — Telephone Encounter (Signed)
-----   Message from Billie Ruddy, MD sent at 03/18/2018  1:24 PM EDT ----- I would call pt to see if he has eaten anything since he had his labs drawn this am.  Pt should check his blood sugar.  If still low pt should take glucose tabs or drink something sweet such as juice to bring his blood sugar up.  He should continue monitoring his fsbs through the day especially if he begins to feel bad.   ----- Message ----- From: Agnes Lawrence, CMA Sent: 03/18/2018   1:21 PM To: Billie Ruddy, MD  Could you please help with this since Dr Maudie Mercury is out of the office? Wendie Simmer   ----- Message ----- From: Tomi Likens Sent: 03/18/2018   1:20 PM To: [name], DO, Agnes Lawrence, CMA  Lab called with a critical Glucose - 46

## 2018-03-18 NOTE — Patient Instructions (Signed)
BEFORE YOU LEAVE: -Labs -follow up: Follow-up as scheduled for annual wellness visit with Manuela Schwartz in May or June; follow-up with Dr. Maudie Mercury in about 3-4 months  You can check on the osteopathic treatments with your insurance, call to schedule if you decide to do this.  We have ordered labs or studies at this visit. It can take up to 1-2 weeks for results and processing. IF results require follow up or explanation, we will call you with instructions. Clinically stable results will be released to your [facility] If you have not heard from [place] or cannot find your results in [facility] in 2 weeks please contact our office at [phone].  If you are not yet signed up for [facility], please consider signing up.         We recommend the following healthy lifestyle for LIFE: 1) Small portions. But, make sure to get regular (at least 3 per day), healthy meals and small healthy snacks if needed.  2) Eat a healthy clean diet.   TRY TO EAT: -at least 5-7 servings of low sugar, colorful, and nutrient rich vegetables per day (not corn, potatoes or bananas.) -berries are the best choice if you wish to eat fruit (only eat small amounts if trying to reduce weight)  -lean meets (fish, white meat of chicken or [place]) -vegan proteins for some meals - beans or tofu, whole grains, nuts and seeds -Replace bad fats with good fats - good fats include: fish, nuts and seeds, canola oil, olive oil -small amounts of low fat or non fat dairy -small amounts of100 % whole grains - check the lables -drink plenty of water  AVOID: -SUGAR, sweets, anything with added sugar, corn syrup or sweeteners - must read labels as even foods advertised as "healthy" often are loaded with sugar -if you must have a sweetener, small amounts of stevia may be best -sweetened beverages and artificially sweetened beverages -simple starches (rice, bread, potatoes, pasta, chips, etc - small amounts of 100% whole grains are ok) -red meat, pork,  butter -fried foods, fast food, processed food, excessive dairy, eggs and coconut.  3)Get at least 150 minutes of sweaty aerobic exercise per week.  4)Reduce stress - consider counseling, meditation and relaxation to balance other aspects of your life.

## 2018-03-18 NOTE — Telephone Encounter (Signed)
I called the pt and informed him of the message below.  Patient stated he did eat a small amount of food this AM prior to his appt.  States he did eat something later, checked his blood sugar and it was 105 and he is feeling OK at this time.

## 2018-03-29 ENCOUNTER — Telehealth: Payer: Self-pay | Admitting: Family Medicine

## 2018-03-29 NOTE — Telephone Encounter (Signed)
Copied from [place] [id number]. Topic: Quick Communication - See Telephone Encounter >> Mar 29, 2018  2:25 PM Ether Griffins B wrote: CRM for notification. See Telephone encounter for: 03/29/18.  Insurance needs the procedure code for the osteopathic treatment OMT.

## 2018-04-01 NOTE — Telephone Encounter (Signed)
I am not sure I understand this request? Did he get OMT or is he asking about cost? It depends on the number of area's treated - do not know until day of exam can give him the range of those codes - I think I gave them to you before? Thanks!

## 2018-04-01 NOTE — Telephone Encounter (Signed)
I need to know how many body parts will be involved in the treatment, or if you know the code please let me know. The ones I have access to are all determined by number of body regions involved.   Thanks!

## 2018-04-02 NOTE — Telephone Encounter (Signed)
I spoke with wife and gave codes along with body regions, pt was driving at the time, he will call back if he decides to schedule.

## 2018-04-02 NOTE — Telephone Encounter (Signed)
LMTCB   Codes are:  CPT Codes Procedure Description  [phone]   one to two body regions involved 98926  three to four body regions involved 98927  five to six body regions involved 90931  seven to eight body regions involved 98929  nine to ten body regions involved

## 2018-04-02 NOTE — Telephone Encounter (Signed)
[phone]  Pt returned call, please call back

## 2018-04-10 ENCOUNTER — Other Ambulatory Visit: Payer: Self-pay | Admitting: Internal Medicine

## 2018-04-19 ENCOUNTER — Other Ambulatory Visit: Payer: Self-pay | Admitting: Internal Medicine

## 2018-04-20 ENCOUNTER — Other Ambulatory Visit: Payer: Self-pay | Admitting: Internal Medicine

## 2018-04-26 ENCOUNTER — Ambulatory Visit: Payer: Medicare Other | Admitting: Internal Medicine

## 2018-04-26 ENCOUNTER — Encounter: Payer: Self-pay | Admitting: Internal Medicine

## 2018-04-26 VITALS — BP 126/46 | HR 68 | Temp 98.0°F | Ht 70.0 in | Wt 201.2 lb

## 2018-04-26 DIAGNOSIS — E1151 Type 2 diabetes mellitus with diabetic peripheral angiopathy without gangrene: Secondary | ICD-10-CM | POA: Diagnosis not present

## 2018-04-26 DIAGNOSIS — E1169 Type 2 diabetes mellitus with other specified complication: Secondary | ICD-10-CM | POA: Diagnosis not present

## 2018-04-26 DIAGNOSIS — E785 Hyperlipidemia, unspecified: Secondary | ICD-10-CM | POA: Diagnosis not present

## 2018-04-26 DIAGNOSIS — IMO0002 Reserved for concepts with insufficient information to code with codable children: Secondary | ICD-10-CM

## 2018-04-26 DIAGNOSIS — E1165 Type 2 diabetes mellitus with hyperglycemia: Secondary | ICD-10-CM | POA: Diagnosis not present

## 2018-04-26 DIAGNOSIS — E1142 Type 2 diabetes mellitus with diabetic polyneuropathy: Secondary | ICD-10-CM | POA: Diagnosis not present

## 2018-04-26 LAB — POCT GLYCOSYLATED HEMOGLOBIN (HGB A1C): Hemoglobin A1C: 7.7

## 2018-04-26 MED ORDER — INSULIN DETEMIR 100 UNIT/ML FLEXPEN: PEN_INJECTOR | SUBCUTANEOUS | 3 refills | Status: DC

## 2018-04-26 NOTE — Progress Notes (Signed)
Subjective:     Patient ID: [name], male   DOB: [birth year], 84 y.o.   MRN: [id number]  HPI [name] is a very pleasant 84 y.o. man returning for management of DM2, dx 07/2012, uncontrolled, insulin-dependent, with complications (CAD, peripheral neuropathy, PAD). Last visit 4 months ago.  He is still using a walker after his fall in 09/2015 as he continues to have weakness in his legs and poor equilibrium.  He completed physical therapy but does not feel that it helped.  He is seeing [name].  He had a cervical MRI that showed osteoarthritis and he got steroid injections in the neck (3). Sugars higher 2/2 injections. Still neck pain at turning head.  He broke his L big toe 4 weeks ago >> L foot in boot.  He had the flu 1 mo ago >> lack of appetite >> recovered now.  Last HbA1C was: Lab Results  Component Value Date   HGBA1C 7.1 12/27/2017   HGBA1C 6.9 08/23/2017   HGBA1C 7.0 04/18/2017   He is on: - Metformin 1000 mg 2x a day - Levemir 20 units at bedtime. - Humalog 15 min before a meal:  4 units before a small meal (use 4 units before breakfast 6 units before a regular meal 8-10 units before a large meal Please subtract 2 units if you plan to be active after a meal.  We reviewed his log: Checks twice a day: - am:  74, 80-120 >> 70-125 >> 75, 88, 90-128, 138 >> 78-126 - 2h after b'fast: 120-160 >> 70-145 >> 130-158 >> 82-126 - before lunch:  91-125 >> 70-146, 150 >> 117-148, 168 >> 106-139 - 2h after lunch:  97-125 >> 132, 140 >> 135-160 >> 150 - dinner: 81-140, 160 x1 >> 80-145 >> 120-160 (snack) >> 122-165 (snack) - 2h after dinner: 119-150 >> 82-140 >> 140-180 >> 109-175 (ate out) - bedtime: 88, 110-132 >> 84-150, 160 >>124-170 >> 114-170 - nighttime: 115-160 >> 90-133 >> 72, 90-152 >> 104-162 >> 81-127, 155, but occasionally he has lows under 80 per his report (these are not mentioned in his log) Has hypoglycemia awareness in the 70s  -No CKD. Last BUN/Cr: Lab  Results  Component Value Date   BUN 17 03/18/2018   CREATININE 0.84 03/18/2018  He is not on an ACE inhibitor or ARB. -+ HL; last lipid panel: Lab Results  Component Value Date   CHOL 96 05/03/2017   HDL 39.10 05/03/2017   LDLCALC 44 05/03/2017   TRIG 64.0 05/03/2017   CHOLHDL 2 05/03/2017  He is on pravastatin. -+ Numbness and tingling  >> peripheral neuropathy.  He continues on B complex and compounded neuropathy cream Sees [name] at [place] [facility]. - Last eye exam 10/2017: No DR (Dr. Alois Cliche).  He had cataract surgery in 08/2015.  He has macular degeneration  >> on Preservision Areds vitamins. On ASA 81.  He had a heart catheterization on 09/03/2014 >> 2 small Blockages >> no intervention other than statins. 2D ECHO with normal EF. In 08/2015 he was hospitalized for ?sepsis with strep pneumoniae after acquiring left lower lobe pneumonia.  He has a heart monitor now.  Review of Systems Constitutional: no weight gain/no weight loss, no fatigue, no subjective hyperthermia, no subjective hypothermia, + nocturia Eyes: no blurry vision, no xerophthalmia ENT: no sore throat, no nodules palpated in throat, no dysphagia, no odynophagia, + hoarseness Cardiovascular: no CP/no SOB/no palpitations/no leg swelling Respiratory: no cough/no SOB/no wheezing Gastrointestinal:  no N/no V/no D/no C/no acid reflux Musculoskeletal: + muscle aches/+ joint aches Skin: no rashes, no hair loss Neurological: no tremors/no numbness/no tingling/no dizziness, + HA  I reviewed pt's medications, allergies, PMH, social hx, family hx, and changes were documented in the history of present illness. Otherwise, unchanged from my initial visit note.   Objective:   Physical Exam BP (!) 126/46 (BP Location: Right Arm, Patient Position: Sitting, Cuff Size: Normal)   Pulse 68   Temp 98 F (36.7 C) (Oral)   Ht 5\' 10"  (1.778 m)   Wt 201 lb 3.2 oz (91.3 kg) Comment: Wearing Una boot  SpO2 97%   BMI 28.87  kg/m  Body mass index is 28.87 kg/m.  Wt Readings from Last 3 Encounters:  04/26/18 201 lb 3.2 oz (91.3 kg)  03/18/18 196 lb 3.2 oz (89 kg)  02/14/18 202 lb 9.6 oz (91.9 kg)   Constitutional: overweight, in NAD, walks with cane, very tanned (stayed out on the porch) Eyes: PERRLA, EOMI, no exophthalmos ENT: moist mucous membranes, no thyromegaly, no cervical lymphadenopathy Cardiovascular: RRR, No MRG Respiratory: CTA B Gastrointestinal: abdomen soft, NT, ND, BS+ Musculoskeletal: no deformities, strength intact in all 4, L foot in boot. Skin: moist, warm, no rashes Neurological: no tremor with outstretched hands, DTR normal in all 4  Assessment:     1. DM2, insulin-dependent, uncontrolled, with complications; target H4V for him: 7-7.5% - CAD - peripheral neuropathy - PAD Dr. [name]  2. PN - 2/2 DM  3. HL  Plan:     1. Pt with long-standing, uncontrolled, type 2 diabetes, on basal-bolus insulin and metformin, with improvement of his control in the last year.  At last visit, we did not change his regimen although his sugars were a little be higher, but still close to goal.   - At this visit, his sugars are little better than before, as he mentions that after his flu episode in 03/2018, his appetite has decreased and has not picked up quite completely.  For example, he does not want anything to snack after dinner, which I think is great.  As a consequence, he may have a low blood sugar at night, slightly under 80, which she needs to correct.  Therefore, I advised him to decrease his dose of Levemir by 10%.  We will keep the rest of the regimen unchanged - I advised him to: Patient Instructions  Please continue: - Metformin 1000 mg 2x a day - Humalog 15 min before a meal:  4 units before a small meal (use 4 units before breakfast 6 units before a regular meal 8 units before a large meal Please subtract 2 units if you plan to be active after a meal.  Please decrease: -  Levemir to 18 units at night.  Please come back for a follow-up appointment in 4 months.   - today, HbA1c is 7.7% (higher), but this is higher than expected from his carefully kept log, may need a fructosamine level at next visit - continue checking sugars at different times of the day - check 3x a day, rotating checks - advised for yearly eye exams >> he is UTD - Return to clinic in 4 mo with sugar log    2. PN -Due to diabetes -Sees podiatry, [name] -Uses B complex daily and neuropathy cream as needed  3. HL - Reviewed latest lipid panel from 04/2017: At goal - Continues pravastatin without side effects - has appt with PCP in 07/2018  Philemon Kingdom, MD PhD [facility]

## 2018-04-26 NOTE — Patient Instructions (Signed)
Please continue: - Metformin 1000 mg 2x a day - Humalog 15 min before a meal:  4 units before a small meal (use 4 units before breakfast 6 units before a regular meal 8 units before a large meal Please subtract 2 units if you plan to be active after a meal.  Please decrease: - Levemir to 18 units at night.  Please come back for a follow-up appointment in 4 months.

## 2018-04-29 ENCOUNTER — Telehealth: Payer: Self-pay | Admitting: Family Medicine

## 2018-04-29 NOTE — Telephone Encounter (Signed)
Copied from [place] [id number]. Topic: Inquiry >> Apr 29, 2018  3:31 PM Pricilla Handler wrote: Reason for CRM: Patient called wanting to speak with Dr. Julianne Rice assistant Wendie Simmer regarding a procedure he wants Dr. Maudie Mercury to perform. Please call the patient today at [phone].        Thank You!!!

## 2018-04-29 NOTE — Telephone Encounter (Signed)
I called the pt and he stated he was not able to reach anyone at his insurance company as he entered the number for providers and did not have a tax identification number to give them.  I advised the pt to enter the number for himself as a patient and ask for coverage of the codes that were given and he agreed.

## 2018-04-30 ENCOUNTER — Ambulatory Visit: Payer: Medicare Other

## 2018-05-01 ENCOUNTER — Telehealth: Payer: Self-pay | Admitting: Family Medicine

## 2018-05-01 NOTE — Telephone Encounter (Signed)
Copied from [place] [id number]. Topic: Inquiry >> May 01, 2018 10:53 AM Antonieta Iba C wrote: Pt called back in to request a call back from Wendie Simmer, pt says that they were discussing scheduling a procedure. He would like to be sure of how it is scheduled.    [phone]

## 2018-05-02 NOTE — Telephone Encounter (Signed)
I called the pt and scheduled appts for the AWV and OMT on 05/21/18 per the pts request.

## 2018-05-08 ENCOUNTER — Ambulatory Visit: Payer: Medicare Other

## 2018-05-10 ENCOUNTER — Other Ambulatory Visit: Payer: Self-pay | Admitting: Internal Medicine

## 2018-05-13 ENCOUNTER — Other Ambulatory Visit: Payer: Self-pay | Admitting: Internal Medicine

## 2018-05-20 NOTE — Progress Notes (Signed)
Subjective:   [name] is a 84 y.o. male who presents for Medicare Annual (Subsequent) preventive examination.  Reports health as fair  OV with Dr. Maudie Mercury at 11:30 fx toe x 3  Months ago; getting groceries and fell up step and fell    Psychosocial from 2018 Dtr lives in [place] MD and son lives here  One grand and 2 great grands He can get out and mow  Has a lift chair; goes to restaurants and needs a cart if out Declined POW and doing well this year with walker and cane  Was a [employer] manager     Diet Chol /hdl 2; ldl 44; trig 64 DM A1c 7.7 May - followed by Dr. Cruzita Lederer  2018; watches his diet Eggs; oatmeal;  Lunch salads, chicken pies Supper varies; wife cooks States wife had flu and double pneumonia  Her sister died recently  79 weeks brother in law killed in MVA Was 6.9 and had to come off his routine   Exercise Tires to walk around as much as he can Does a lot of walking in the house  Was at risk for falling when bending over Neuropathy/ was active all of his life  Agreed to try the sliver sneaker class last year  There are no preventive care reminders to display for this patient. Diabetic eye exam 10/2017 done  States he was seen 3 weeks ago by podiatry   Dr. Cruzita Lederer also checks his feet as well   Completed his shingrix series   Cardiac Risk Factors include: advanced age (>39men, >44 women);diabetes mellitus;dyslipidemia;family history of premature cardiovascular disease;hypertension;male gender;sedentary lifestyle     Objective:     Vitals: BP 122/80   Ht 5\' 10"  (1.778 m)   Wt 200 lb (90.7 kg)   BMI 28.70 kg/m   Body mass index is 28.7 kg/m.  Advanced Directives 04/27/2017 08/01/2016 08/26/2015 03/26/2015 02/28/2015 09/03/2014 08/18/2013  Does Patient Have a Medical Advance Directive? Yes Yes Yes Yes No Yes Patient has advance directive, copy not in chart  Type of Advance Directive - Living will;Healthcare Power of Attorney Living will;Healthcare  Power of [place];Living will - Living will -  Does patient want to make changes to medical advance directive? - No - Patient declined No - Patient declined - - No - Patient declined -  Copy of [place] in Chart? - Yes No - copy requested - - No - copy requested Copy requested from family  Would patient like information on creating a medical advance directive? - - - - No - patient declined information - -    Tobacco Social History   Tobacco Use  Smoking Status Never Smoker  Smokeless Tobacco Never Used     Counseling given: Yes   Clinical Intake:    Past Medical History:  Diagnosis Date  . Anemia   . Atherosclerotic PVD with intermittent claudication ([place])   . CAD (coronary artery disease)   . Diabetes mellitus    type 2 with peripheral neuropathy  . Diastolic CHF, chronic ([place])   . Diverticulosis   . DVT (deep venous thrombosis) ([place])   . Dyslipidemia   . Edema 08/19/2014  . GERD (gastroesophageal reflux disease)    hx hiatal hernia  . HTN (hypertension)   . IBS (irritable bowel syndrome)    Hx: of  . Osteoarthritis   . Overactive bladder    Hx: of stress incontinence  . Pneumonia 2002   history  of  . Shingles   . Varicose veins of lower extremities with other complications 1/61/0960   Past Surgical History:  Procedure Laterality Date  . BACK SURGERY    . CATARACT EXTRACTION Bilateral 08/10/2015,08/15/2015  . COLONOSCOPY     Hx: of  . CTS bilateral    . KNEE ARTHROSCOPY  1988, 1992   bilateral  . LAMINECTOMY  08/18/2013   DECOMPRESSIVE LAMINECTOMIES  . LEFT AND RIGHT HEART CATHETERIZATION WITH CORONARY ANGIOGRAM N/A 09/03/2014   Procedure: LEFT AND RIGHT HEART CATHETERIZATION WITH CORONARY ANGIOGRAM;  Surgeon: [name], MD;  Location[facility] C[facility] LAB;  Service: Cardiovascular;  Laterality: N/A;  . LUMBAR LAMINECTOMY/DECOMPRESSION MICRODISCECTOMY N/A 08/18/2013   Procedure: Lumbar two-three, lumbar three-four,  lumbar four-five decompressive laminectomies;  Surgeon: [name], MD;  Location: [facility] NEURO ORS;  Service: Neurosurgery;  Laterality: N/A;  . lumbar left arm  1961  . TONSILLECTOMY    . TOTAL KNEE ARTHROPLASTY  2005, 2007  . wrist tendon surgery     Family History  Problem Relation Age of Onset  . Other Mother 34       CABG  . Heart disease Mother   . Hypertension Mother   . Hyperlipidemia Son    Social History   Socioeconomic History  . Marital status: Married    Spouse name: Not on file  . Number of children: 2  . Years of education: Not on file  . Highest education level: Not on file  Occupational History  . Not on file  Social Needs  . Financial resource strain: Not on file  . Food insecurity:    Worry: Not on file    Inability: Not on file  . Transportation needs:    Medical: Not on file    Non-medical: Not on file  Tobacco Use  . Smoking status: Never Smoker  . Smokeless tobacco: Never Used  Substance and Sexual Activity  . Alcohol use: No    Alcohol/week: 0.0 oz  . Drug use: No  . Sexual activity: Not on file  Lifestyle  . Physical activity:    Days per week: Not on file    Minutes per session: Not on file  . Stress: Not on file  Relationships  . Social connections:    Talks on phone: Not on file    Gets together: Not on file    Attends religious service: Not on file    Active member of club or organization: Not on file    Attends meetings of clubs or organizations: Not on file    Relationship status: Not on file  Other Topics Concern  . 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  amLODipine (NORVASC) 5 MG tablet TAKE ONE TABLET BY MOUTH DAILY  . aspirin 81 MG tablet Take 81 mg by mouth daily.   . B-D UF III MINI PEN NEEDLES 31G X 5 MM MISC USE AS DIRECTED FOUR TIMES A DAY  . furosemide (LASIX) 40 MG tablet Take 1 tablet (40 mg total) by  mouth daily. ONLY FOR PRN SWELLING  . glucose blood (ONETOUCH VERIO) test strip USE TO TEST BLOOD SUGAR THREE TIMES A DAY AS DIRECTED  . HUMALOG KWIKPEN 100 UNIT/ML KiwkPen INJECT 6 TO 10 UNITS BEFORE EACH MEAL THREE TIMES A DAY  . ibuprofen (ADVIL,MOTRIN) 200 MG tablet Take 200 mg by mouth every 6 (six) hours as needed.  . Insulin Detemir (LEVEMIR FLEXTOUCH) 100 UNIT/ML Pen INJECT 18 UNITS INTO THE SKIN DAILY AT 10 PM  . Lancet Device MISC by Does not apply route. UAD for TID blood glucose monitoring  . Magnesium 200 MG TABS Take 1 tablet (200 mg total) by mouth daily.  . meclizine (ANTIVERT) 25 MG tablet Take 25 mg by mouth 2 (two) times daily.  . metFORMIN (GLUCOPHAGE) 1000 MG tablet TAKE ONE TABLET BY MOUTH TWICE A DAY WITH MEALS  . Multiple Vitamins-Minerals (CENTRUM SILVER PO) Take 1 tablet by mouth daily.   . Naproxen Sodium (ALEVE) 220 MG CAPS Take by mouth as needed.  Marland Kitchen omeprazole (PRILOSEC) 20 MG capsule TAKE ONE CAPSULE BY MOUTH DAILY  . ONETOUCH DELICA LANCETS 87O MISC USE TO TEST BLOOD SUGAR THREE TIMES A DAY AS DIRECTED  . ONETOUCH VERIO test strip USE TO TEST BLOOD SUGAR 3 TIMES A DAY AS DIRECTED  . OVER THE COUNTER MEDICATION   . Propylene Glycol (SYSTANE BALANCE) 0.6 % SOLN Apply to eye. 2 drops each eye once daily  . vitamin B-12 (CYANOCOBALAMIN) 1000 MCG tablet Take 1,000 mcg by mouth daily.  . pravastatin (PRAVACHOL) 40 MG tablet Take 1 tablet (40 mg total) by mouth every evening.  . [DISCONTINUED] acetaminophen (TYLENOL) 650 MG CR tablet Take 650 mg by mouth as needed for pain.   No facility-administered encounter medications on file as of 05/21/2018.     Activities of Daily Living In your present state of  health, do you have any difficulty performing the following activities: 05/21/2018  Hearing? N  Vision? N  Difficulty concentrating or making decisions? N  Walking or climbing stairs? Y  Comment home on one level  Dressing or bathing? N  Doing errands, shopping? N  Preparing Food and eating ? N  Using the Toilet? N  In the past six months, have you accidently leaked urine? (No Data)  Comment no control over bladder - sees urologist   Do you have problems with loss of bowel control? N  Managing your Medications? N  Managing your Finances? N  Housekeeping or managing your Housekeeping? N  Some recent data might be hidden    Patient Care Team: [name], DO as PCP - General (Family Medicine) [name] Denice Bors, MD as PCP - Cardiology (Cardiology) Hillary Bow, MD as Attending Physician (Cardiology) Philemon Kingdom, MD as Consulting Physician (Endocrinology) Juluis Rainier [facility])    Assessment:   This is a routine wellness examination for [place].  Exercise Activities and Dietary recommendations Current Exercise Habits: Home exercise routine, Intensity: Mild  Goals    . Patient Stated     The silver sneaker program is still available.       Fall Risk Fall Risk  05/21/2018 04/27/2017 04/20/2016 03/26/2015 03/26/2015  Falls in the past year? Yes Yes No Yes Yes  Number falls in past yr: 1 2 or more - 1 1  Injury with Fall? Yes - - No Yes  Risk Factor Category  (No Data) High Fall Risk - - -  Comment is improved this year from last  - - - -  Risk for fall due to : - History of fall(s);Impaired mobility - - -  Follow up Education provided Education provided - Education provided -  Comment - had PT but did not help  - - -   Had one fall; broke toe Has walker and cane Dizziness was improved after he came off of BP med  Depression Screen PHQ 2/9 Scores 05/21/2018 05/21/2018 04/27/2017 04/20/2016  PHQ - 2 Score 0 0 0 0     Cognitive Function MMSE - Mini Mental State  Exam 05/21/2018 04/27/2017  Not completed: (No Data) -  Orientation to time - 5  Orientation to Place - 5  Registration - 3  Attention/ Calculation - 4  Recall - 2  Language- name 2 objects - 2  Language- repeat - 1  Language- follow 3 step command - 3  Language- read & follow direction - 1  Write a sentence - 1  Copy design - 1  Total score - 28    starting to forget names  Recall 0/3 today;  Serial 7s; 4/of 5  Oriented during exam;  Good historian Not forgetting his meds and no failures at independent living.  Does not forget apt.    Immunization History  Administered Date(s) Administered  . Influenza Split 10/06/2011, 09/13/2012  . Influenza Whole 09/11/2007, 09/22/2008, 09/13/2009, 09/26/2010  . Influenza, High Dose Seasonal PF 09/10/2015, 09/01/2016, 08/02/2017  . Influenza,inj,Quad PF,6+ Mos 08/13/2013, 08/21/2014  . Pneumococcal Conjugate-13 11/23/2014  . Pneumococcal Polysaccharide-23 12/11/1997, 09/13/2009  . Td 12/24/2007  . Tdap 08/25/2014  . Zoster 01/03/2007  . Zoster Recombinat (Shingrix) 06/15/2017, 09/10/2017     Screening Tests Health Maintenance  Topic Date Due  . INFLUENZA VACCINE  07/11/2018  . OPHTHALMOLOGY EXAM  10/11/2018  . HEMOGLOBIN A1C  10/27/2018  . FOOT EXAM  05/22/2019  . TETANUS/TDAP  08/25/2024  . PNA vac Low Risk Adult  Completed        Plan:      PCP Notes   Health Maintenance Diabetic eye exam 10/2017 done  States he was seen 3 weeks ago by podiatry  States Dr. Cruzita Lederer does his foot exams  Dr. Cruzita Lederer also checks his feet as well   Completed his shingrix series    Abnormal Screens  Recall 0/3 but correctly stated 4 of 5/ serial 7s from 100 No issues with daily living. Good historian; manages his own meds States dizziness is much better since cardiology stopped or changed BP meds   Referrals  none  Patient concerns; None noted   Nurse Concerns; As noted   Next PCP apt today      I have  personally reviewed and noted the following in the patient's chart:   . Medical and social history . Use of alcohol, tobacco or illicit drugs  . Current medications and supplements . Functional ability and status . Nutritional status . Physical activity . Advanced directives . List of other physicians . Hospitalizations, surgeries, and ER visits in previous 12 months . Vitals . Screenings to include cognitive, depression, and falls . Referrals and appointments  In addition, I have reviewed and discussed with patient certain preventive protocols, quality metrics, and best practice recommendations. A written personalized care plan for preventive services as well as general preventive health recommendations were provided to patient.     Wynetta Fines, RN  05/21/2018

## 2018-05-20 NOTE — Progress Notes (Signed)
HPI:  Using dictation device. Unfortunately this device frequently misinterprets words/phrases.  Here for OMM (Osteopathic Musculoskeletal Medicine) appt for chronic neck apin: -sees Dr. Nelva Bush (PMR) for this, but treatments did not help -he saw several neurosurgeons in the past and reports they told him surgery would not help -wants to try OMM for this -pain is intermittent, triggered by turning head to the R, R sided, sharp - also with standing up with walker; recent L great toe fx (reports treated with boot x 6 weeks, recently out of boot), some R low back pain last week, now resolved -hx bilat knee replacements, hx low back surgery with Dr. Arnoldo Morale ([facility]) -denies: radiation, weakness, numbness - though occ tingly R 1-2nd fingers with increased walker use -prior treatments: tramadol, injections, low back surgery -brings MRI report from [facility] from 08/2017 - see scanned report, sig deg changes  ROS: See pertinent positives and negatives per HPI.  Past Medical History:  Diagnosis Date  . Anemia   . Atherosclerotic PVD with intermittent claudication ([place])   . CAD (coronary artery disease)   . Diabetes mellitus    type 2 with peripheral neuropathy  . Diastolic CHF, chronic ([place])   . Diverticulosis   . DVT (deep venous thrombosis) ([place])   . Dyslipidemia   . Edema 08/19/2014  . GERD (gastroesophageal reflux disease)    hx hiatal hernia  . HTN (hypertension)   . IBS (irritable bowel syndrome)    Hx: of  . Osteoarthritis   . Overactive bladder    Hx: of stress incontinence  . Pneumonia 2002   history of  . Shingles   . Varicose veins of lower extremities with other complications 1/61/0960    Past Surgical History:  Procedure Laterality Date  . BACK SURGERY    . CATARACT EXTRACTION Bilateral 08/10/2015,08/15/2015  . COLONOSCOPY     Hx: of  . CTS bilateral    . KNEE ARTHROSCOPY  1988, 1992   bilateral  . LAMINECTOMY  08/18/2013   DECOMPRESSIVE LAMINECTOMIES  . LEFT AND RIGHT  HEART CATHETERIZATION WITH CORONARY ANGIOGRAM N/A 09/03/2014   Procedure: LEFT AND RIGHT HEART CATHETERIZATION WITH CORONARY ANGIOGRAM;  Surgeon: [name], MD;  Location: [facility] CATH LAB;  Service: Cardiovascular;  Laterality: N/A;  . LUMBAR LAMINECTOMY/DECOMPRESSION MICRODISCECTOMY N/A 08/18/2013   Procedure: Lumbar two-three, lumbar three-four, lumbar four-five decompressive laminectomies;  Surgeon: [name], MD;  Location: [place] NEURO ORS;  Service: Neurosurgery;  Laterality: N/A;  . lumbar left arm  1961  . TONSILLECTOMY    . TOTAL KNEE ARTHROPLASTY  2005, 2007  . wrist tendon surgery      Family History  Problem Relation Age of Onset  . Other Mother 34       CABG  . Heart disease Mother   . Hypertension Mother   . Hyperlipidemia Son     SOCIAL HX: see hpi   Current Outpatient Medications:  .  amLODipine (NORVASC) 5 MG tablet, TAKE ONE TABLET BY MOUTH DAILY, Disp: 90 tablet, Rfl: 3 .  aspirin 81 MG tablet, Take 81 mg by mouth daily. , Disp: , Rfl:  .  B-D UF III MINI PEN NEEDLES 31G X 5 MM MISC, USE AS DIRECTED FOUR TIMES A DAY, Disp: 200 each, Rfl: 1 .  furosemide (LASIX) 40 MG tablet, Take 1 tablet (40 mg total) by mouth daily. ONLY FOR PRN SWELLING, Disp: 90 tablet, Rfl: 3 .  glucose blood (ONETOUCH VERIO) test strip, USE TO TEST BLOOD SUGAR  THREE TIMES A DAY AS DIRECTED, Disp: 250 each, Rfl: 4 .  HUMALOG KWIKPEN 100 UNIT/ML KiwkPen, INJECT 6 TO 10 UNITS BEFORE EACH MEAL THREE TIMES A DAY, Disp: 15 pen, Rfl: 0 .  Insulin Detemir (LEVEMIR FLEXTOUCH) 100 UNIT/ML Pen, INJECT 18 UNITS INTO THE SKIN DAILY AT 10 PM, Disp: 15 pen, Rfl: 3 .  Lancet Device MISC, by Does not apply route. UAD for TID blood glucose monitoring, Disp: , Rfl:  .  Magnesium 200 MG TABS, Take 1 tablet (200 mg total) by mouth daily., Disp: 30 each, Rfl: 11 .  meclizine (ANTIVERT) 25 MG tablet, Take 25 mg by mouth 2 (two) times daily., Disp: , Rfl:  .  metFORMIN (GLUCOPHAGE) 1000 MG tablet, TAKE ONE  TABLET BY MOUTH TWICE A DAY WITH MEALS, Disp: 180 tablet, Rfl: 0 .  Multiple Vitamins-Minerals (CENTRUM SILVER PO), Take 1 tablet by mouth daily. , Disp: , Rfl:  .  Naproxen Sodium (ALEVE) 220 MG CAPS, Take by mouth as needed., Disp: , Rfl:  .  omeprazole (PRILOSEC) 20 MG capsule, TAKE ONE CAPSULE BY MOUTH DAILY, Disp: 90 capsule, Rfl: 1 .  ONETOUCH DELICA LANCETS 40J MISC, USE TO TEST BLOOD SUGAR THREE TIMES A DAY AS DIRECTED, Disp: 200 each, Rfl: 0 .  ONETOUCH VERIO test strip, USE TO TEST BLOOD SUGAR 3 TIMES A DAY AS DIRECTED, Disp: 200 each, Rfl: 3 .  OVER THE COUNTER MEDICATION, , Disp: , Rfl:  .  Propylene Glycol (SYSTANE BALANCE) 0.6 % SOLN, Apply to eye. 2 drops each eye once daily, Disp: , Rfl:  .  vitamin B-12 (CYANOCOBALAMIN) 1000 MCG tablet, Take 1,000 mcg by mouth daily., Disp: , Rfl:  .  ibuprofen (ADVIL,MOTRIN) 200 MG tablet, Take 200 mg by mouth every 6 (six) hours as needed., Disp: , Rfl:  .  pravastatin (PRAVACHOL) 40 MG tablet, Take 1 tablet (40 mg total) by mouth every evening., Disp: 90 tablet, Rfl: 3  EXAM:  Vitals:   05/21/18 1116  BP: 122/80  Pulse: 68  Temp: 98.1 F (36.7 C)    Body mass index is 28.81 kg/m.  GENERAL: vitals reviewed and listed above, alert, oriented, appears well hydrated and in no acute distress  HEENT: atraumatic, conjunttiva clear, no obvious abnormalities on inspection of external nose and ears  NECK: no obvious masses on inspection  MS: moves all extremities without noticeable abnormality, cautious gate with walker - strains/difficulty with rising from chair with walker and height of walker. Head forward posture - ~ 2 inches, muscle strength fairly well preserved in UE bilat, normal sens to light touch throughout, LE edema bilat, lipoma post neck, surgical scars both knees, sig sub occ, trap, scalene, SCM bilat muscle tension, T 4-7 RrSr, L inf SBS strain, OA Rr, TI Rr, L2 FRlSl, L SI restriction, L ant inn, L pos fib head  PSYCH:  pleasant and cooperative, no obvious depression or anxiety  ASSESSMENT AND PLAN:  Discussed the following assessment and plan:  Neck pain  Low back pain, unspecified back pain laterality, unspecified chronicity, with sciatica presence unspecified  Uses walker - Plan: Ambulatory referral to Physical Therapy  Muscular deconditioning - Plan: Ambulatory referral to Physical Therapy  Somatic dysfunction of head region  Cervical (neck) region somatic dysfunction  Somatic dysfunction of spine, thoracic  Somatic dysfunction of spine, lumbar  Somatic dysfunction of pelvic region  Somatic dysfunction of left lower extremity  -he has tried injections and seen PMR and surgeon about his neck he  wanted to try non-invasive options as has had no relief at this point -discussed his condition, MRI finding, walker use - some strain may be occurring related to the height and way he uses his walker - will have PT assist with this, asked if he would like to inquire with his specialist or have [place] refer, he wanted [place] to refer, also suggest PT assistance for gait strength, stability -OMT - see below - NO osseous rotational, direct, HVLA or agreessive treatments on the neck given his Deg dz  PROCEDURE NOTE : OSTEOPATHIC TREATMENT The decision today to treat with gentle Osteopathic Manipulative Therapy  (OMT) was based on physical exam findings, diagnoses and patient wishes. Verbal consent was obtained after after explanation of risks and benefits. No Cervical HVLA manipulation was performed. After consent was obtained, treatment was  performed as below:      Regions treated:  Cervical, head, thoracic, lumbar, pelvic, LE     Techniques used: Me, MR, Cranial, BLT The patient tolerated the treatment well and reported Improved  symptoms following treatment today. Follow up treatment was advised in: 1-2 weeks  -Patient advised to return or notify a doctor immediately if symptoms worsen or  persist or new concerns arise.  There are no Patient Instructions on file for this visit.  [name], DO

## 2018-05-21 ENCOUNTER — Ambulatory Visit: Payer: Medicare Other | Admitting: Family Medicine

## 2018-05-21 ENCOUNTER — Other Ambulatory Visit: Payer: Self-pay | Admitting: Internal Medicine

## 2018-05-21 ENCOUNTER — Encounter: Payer: Self-pay | Admitting: Family Medicine

## 2018-05-21 ENCOUNTER — Ambulatory Visit (INDEPENDENT_AMBULATORY_CARE_PROVIDER_SITE_OTHER): Payer: Medicare Other

## 2018-05-21 VITALS — BP 122/80 | Ht 70.0 in | Wt 200.0 lb

## 2018-05-21 VITALS — BP 122/80 | HR 68 | Temp 98.1°F | Ht 70.0 in | Wt 200.8 lb

## 2018-05-21 DIAGNOSIS — M9902 Segmental and somatic dysfunction of thoracic region: Secondary | ICD-10-CM

## 2018-05-21 DIAGNOSIS — Z0000 Encounter for general adult medical examination without abnormal findings: Secondary | ICD-10-CM

## 2018-05-21 DIAGNOSIS — R29898 Other symptoms and signs involving the musculoskeletal system: Secondary | ICD-10-CM | POA: Diagnosis not present

## 2018-05-21 DIAGNOSIS — Z9989 Dependence on other enabling machines and devices: Secondary | ICD-10-CM | POA: Diagnosis not present

## 2018-05-21 DIAGNOSIS — M9901 Segmental and somatic dysfunction of cervical region: Secondary | ICD-10-CM | POA: Diagnosis not present

## 2018-05-21 DIAGNOSIS — M9905 Segmental and somatic dysfunction of pelvic region: Secondary | ICD-10-CM

## 2018-05-21 DIAGNOSIS — M545 Low back pain: Secondary | ICD-10-CM

## 2018-05-21 DIAGNOSIS — M9900 Segmental and somatic dysfunction of head region: Secondary | ICD-10-CM

## 2018-05-21 DIAGNOSIS — M9903 Segmental and somatic dysfunction of lumbar region: Secondary | ICD-10-CM | POA: Diagnosis not present

## 2018-05-21 DIAGNOSIS — M542 Cervicalgia: Secondary | ICD-10-CM | POA: Diagnosis not present

## 2018-05-21 DIAGNOSIS — M9906 Segmental and somatic dysfunction of lower extremity: Secondary | ICD-10-CM | POA: Diagnosis not present

## 2018-05-21 NOTE — Patient Instructions (Addendum)
[name] , Thank you for taking time to come for your Medicare Wellness Visit. I appreciate your ongoing commitment to your health goals. Please review the following plan we discussed and let me know if I can assist you in the future.   These are the goals we discussed: Goals    . Patient Stated     The silver sneaker program is still available.       This is a list of the screening recommended for you and due dates:  Health Maintenance  Topic Date Due  . Flu Shot  07/11/2018  . Eye exam for diabetics  10/11/2018  . Hemoglobin A1C  10/27/2018  . Complete foot exam   05/22/2019  . Tetanus Vaccine  08/25/2024  . Pneumonia vaccines  Completed     Health Maintenance, Male A healthy lifestyle and preventive care is important for your health and wellness. Ask your health care provider about what schedule of regular examinations is right for you. What should I know about weight and diet? Eat a Healthy Diet  Eat plenty of vegetables, fruits, whole grains, low-fat dairy products, and lean protein.  Do not eat a lot of foods high in solid fats, added sugars, or salt.  Maintain a Healthy Weight Regular exercise can help you achieve or maintain a healthy weight. You should:  Do at least 150 minutes of exercise each week. The exercise should increase your heart rate and make you sweat (moderate-intensity exercise).  Do strength-training exercises at least twice a week.  Watch Your Levels of Cholesterol and Blood Lipids  Have your blood tested for lipids and cholesterol every 5 years starting at 82 years of age. If you are at high risk for heart disease, you should start having your blood tested when you are 82 years old. You may need to have your cholesterol levels checked more often if: ? Your lipid or cholesterol levels are high. ? You are older than 82 years of age. ? You are at high risk for heart disease.  What should I know about cancer screening? Many types of cancers can  be detected early and may often be prevented. Lung Cancer  You should be screened every year for lung cancer if: ? You are a current smoker who has smoked for at least 30 years. ? You are a former smoker who has quit within the past 15 years.  Talk to your health care provider about your screening options, when you should start screening, and how often you should be screened.  Colorectal Cancer  Routine colorectal cancer screening usually begins at 82 years of age and should be repeated every 5-10 years until you are 82 years old. You may need to be screened more often if early forms of precancerous polyps or small growths are found. Your health care provider may recommend screening at an earlier age if you have risk factors for colon cancer.  Your health care provider may recommend using home test kits to check for hidden blood in the stool.  A small camera at the end of a tube can be used to examine your colon (sigmoidoscopy or colonoscopy). This checks for the earliest forms of colorectal cancer.  Prostate and Testicular Cancer  Depending on your age and overall health, your health care provider may do certain tests to screen for prostate and testicular cancer.  Talk to your health care provider about any symptoms or concerns you have about testicular or prostate cancer.  Skin Cancer  Check your skin from head to toe regularly.  Tell your health care provider about any new moles or changes in moles, especially if: ? There is a change in a mole's size, shape, or color. ? You have a mole that is larger than a pencil eraser.  Always use sunscreen. Apply sunscreen liberally and repeat throughout the day.  Protect yourself by wearing long sleeves, pants, a wide-brimmed hat, and sunglasses when outside.  What should I know about heart disease, diabetes, and high blood pressure?  If you are 22-42 years of age, have your blood pressure checked every 3-5 years. If you are 37 years of  age or older, have your blood pressure checked every year. You should have your blood pressure measured twice-once when you are at a hospital or clinic, and once when you are not at a hospital or clinic. Record the average of the two measurements. To check your blood pressure when you are not at a hospital or clinic, you can use: ? An automated blood pressure machine at a pharmacy. ? A home blood pressure monitor.  Talk to your health care provider about your target blood pressure.  If you are between 39-27 years old, ask your health care provider if you should take aspirin to prevent heart disease.  Have regular diabetes screenings by checking your fasting blood sugar level. ? If you are at a normal weight and have a low risk for diabetes, have this test once every three years after the age of 58. ? If you are overweight and have a high risk for diabetes, consider being tested at a younger age or more often.  A one-time screening for abdominal aortic aneurysm (AAA) by ultrasound is recommended for men aged 46-75 years who are current or former smokers. What should I know about preventing infection? Hepatitis B If you have a higher risk for hepatitis B, you should be screened for this virus. Talk with your health care provider to find out if you are at risk for hepatitis B infection. Hepatitis C Blood testing is recommended for:  Everyone born from [birth year] through 1963.  Anyone with known risk factors for hepatitis C.  Sexually Transmitted Diseases (STDs)  You should be screened each year for STDs including gonorrhea and chlamydia if: ? You are sexually active and are younger than 82 years of age. ? You are older than 82 years of age and your health care provider tells you that you are at risk for this type of infection. ? Your sexual activity has changed since you were last screened and you are at an increased risk for chlamydia or gonorrhea. Ask your health care provider if you are at  risk.  Talk with your health care provider about whether you are at high risk of being infected with HIV. Your health care provider may recommend a prescription medicine to help prevent HIV infection.  What else can I do?  Schedule regular health, dental, and eye exams.  Stay current with your vaccines (immunizations).  Do not use any tobacco products, such as cigarettes, chewing tobacco, and e-cigarettes. If you need help quitting, ask your health care provider.  Limit alcohol intake to no more than 2 drinks per day. One drink equals 12 ounces of beer, 5 ounces of wine, or 1 ounces of hard liquor.  Do not use street drugs.  Do not share needles.  Ask your health care provider for help if you need support or information about quitting drugs.  Tell  your health care provider if you often feel depressed.  Tell your health care provider if you have ever been abused or do not feel safe at home. This information is not intended to replace advice given to you by your health care provider. Make sure you discuss any questions you have with your health care provider. Document Released: 05/25/2008 Document Revised: 07/26/2016 Document Reviewed: 08/31/2015 Elsevier Interactive Patient Education  2018 [place] in the Home Falls can cause injuries and can affect people from all age groups. There are many simple things that you can do to make your home safe and to help prevent falls. What can I do on the outside of my home?  Regularly repair the edges of walkways and driveways and fix any cracks.  Remove high doorway thresholds.  Trim any shrubbery on the main path into your home.  Use bright outdoor lighting.  Clear walkways of debris and clutter, including tools and rocks.  Regularly check that handrails are securely fastened and in good repair. Both sides of any steps should have handrails.  Install guardrails along the edges of any raised decks or  porches.  Have leaves, snow, and ice cleared regularly.  Use sand or salt on walkways during winter months.  In the garage, clean up any spills right away, including grease or oil spills. What can I do in the bathroom?  Use night lights.  Install grab bars by the toilet and in the tub and shower. Do not use towel bars as grab bars.  Use non-skid mats or decals on the floor of the tub or shower.  If you need to sit down while you are in the shower, use a plastic, non-slip stool.  Keep the floor dry. Immediately clean up any water that spills on the floor.  Remove soap buildup in the tub or shower on a regular basis.  Attach bath mats securely with double-sided non-slip rug tape.  Remove throw rugs and other tripping hazards from the floor. What can I do in the bedroom?  Use night lights.  Make sure that a bedside light is easy to reach.  Do not use oversized bedding that drapes onto the floor.  Have a firm chair that has side arms to use for getting dressed.  Remove throw rugs and other tripping hazards from the floor. What can I do in the kitchen?  Clean up any spills right away.  Avoid walking on wet floors.  Place frequently used items in easy-to-reach places.  If you need to reach for something above you, use a sturdy step stool that has a grab bar.  Keep electrical cables out of the way.  Do not use floor polish or wax that makes floors slippery. If you have to use wax, make sure that it is non-skid floor wax.  Remove throw rugs and other tripping hazards from the floor. What can I do in the stairways?  Do not leave any items on the stairs.  Make sure that there are handrails on both sides of the stairs. Fix handrails that are broken or loose. Make sure that handrails are as long as the stairways.  Check any carpeting to make sure that it is firmly attached to the stairs. Fix any carpet that is loose or worn.  Avoid having throw rugs at the top or bottom  of stairways, or secure the rugs with carpet tape to prevent them from moving.  Make sure that you have a light  switch at the top of the stairs and the bottom of the stairs. If you do not have them, have them installed. What are some other fall prevention tips?  Wear closed-toe shoes that fit well and support your feet. Wear shoes that have rubber soles or low heels.  When you use a stepladder, make sure that it is completely opened and that the sides are firmly locked. Have someone hold the ladder while you are using it. Do not climb a closed stepladder.  Add color or contrast paint or tape to grab bars and handrails in your home. Place contrasting color strips on the first and last steps.  Use mobility aids as needed, such as canes, walkers, scooters, and crutches.  Turn on lights if it is dark. Replace any light bulbs that burn out.  Set up furniture so that there are clear paths. Keep the furniture in the same spot.  Fix any uneven floor surfaces.  Choose a carpet design that does not hide the edge of steps of a stairway.  Be aware of any and all pets.  Review your medicines with your healthcare provider. Some medicines can cause dizziness or changes in blood pressure, which increase your risk of falling. Talk with your health care provider about other ways that you can decrease your risk of falls. This may include working with a physical therapist or trainer to improve your strength, balance, and endurance. This information is not intended to replace advice given to you by your health care provider. Make sure you discuss any questions you have with your health care provider. Document Released: 11/17/2002 Document Revised: 04/25/2016 Document Reviewed: 01/01/2015 Elsevier Interactive Patient Education  [employer].

## 2018-05-22 NOTE — Progress Notes (Signed)
Hannah R Kim, DO  

## 2018-06-03 NOTE — Progress Notes (Signed)
HPI:  Using dictation device. Unfortunately this device frequently misinterprets words/phrases.  Follow up OMM for neck pain: -session 2 -chronic -sees Dr. Nelva Bush, pmr - feels treatments have not helped -reports saw several neurosurgeons and was told surgery would not help Today reports: had one session so far with physical therapist at [facility], felt better after last treatment but the last few days feels pain is back to baseline around a 6/10 in the R posterior neck. Has been more aware when using his walker and feels he scrunches up his shoulders and neck when using the regular walker - more comfortable with his Rolator, but it is not as easy to put in the care. Denies: worsening, fevers, illness, weakness, numbness   Prior history initial visit: -chronic -sees Dr. Nelva Bush, pmr - feels treatments have not helped -reports saw several neurosurgeons and was told surgery would not help -pain intermittent, triggered by turning head to the R, R sided, sharp - also with standing up with walker; recent L great toe fx (reports treated with boot x 6 weeks, recently out of boot), some R low back pain last week, now resolved -hx bilat knee replacements, hx low back surgery with Dr. Arnoldo Morale ([facility]) -denies: radiation, weakness, numbness - though occ tingly R 1-2nd fingers with increased walker use -prior treatments: tramadol, injections, low back surgery -brings MRI report from [facility] from 08/2017 - see scanned report, sig deg changes    ROS: See pertinent positives and negatives per HPI.  Past Medical History:  Diagnosis Date  . Anemia   . Atherosclerotic PVD with intermittent claudication ([place])   . CAD (coronary artery disease)   . Diabetes mellitus    type 2 with peripheral neuropathy  . Diastolic CHF, chronic ([place])   . Diverticulosis   . DVT (deep venous thrombosis) ([place])   . Dyslipidemia   . Edema 08/19/2014  . GERD (gastroesophageal reflux disease)    hx hiatal hernia  . HTN  (hypertension)   . IBS (irritable bowel syndrome)    Hx: of  . Osteoarthritis   . Overactive bladder    Hx: of stress incontinence  . Pneumonia 2002   history of  . Shingles   . Varicose veins of lower extremities with other complications 0/86/5784    Past Surgical History:  Procedure Laterality Date  . BACK SURGERY    . CATARACT EXTRACTION Bilateral 08/10/2015,08/15/2015  . COLONOSCOPY     Hx: of  . CTS bilateral    . KNEE ARTHROSCOPY  1988, 1992   bilateral  . LAMINECTOMY  08/18/2013   DECOMPRESSIVE LAMINECTOMIES  . LEFT AND RIGHT HEART CATHETERIZATION WITH CORONARY ANGIOGRAM N/A 09/03/2014   Procedure: LEFT AND RIGHT HEART CATHETERIZATION WITH CORONARY ANGIOGRAM;  Surgeon: [name], MD;  Location: [facility] CATH LAB;  Service: Cardiovascular;  Laterality: N/A;  . LUMBAR LAMINECTOMY/DECOMPRESSION MICRODISCECTOMY N/A 08/18/2013   Procedure: Lumbar two-three, lumbar three-four, lumbar four-five decompressive laminectomies;  Surgeon: [name], MD;  Location: [place] [facility] NEURO ORS;  Service: Neurosurgery;  Laterality: N/A;  . lumbar left arm  1961  . TONSILLECTOMY    . TOTAL KNEE ARTHROPLASTY  2005, 2007  . wrist tendon surgery      Family History  Problem Relation Age of Onset  . Other Mother 50       CABG  . Heart disease Mother   . Hypertension Mother   . Hyperlipidemia Son     SOCIAL HX: see hpi   Current Outpatient Medications:  .  amLODipine (NORVASC) 5 MG tablet, TAKE ONE TABLET BY MOUTH DAILY, Disp: 90 tablet, Rfl: 3 .  aspirin 81 MG tablet, Take 81 mg by mouth daily. , Disp: , Rfl:  .  B-D UF III MINI PEN NEEDLES 31G X 5 MM MISC, USE AS DIRECTED FOUR TIMES A DAY, Disp: 200 each, Rfl: 1 .  furosemide (LASIX) 40 MG tablet, Take 1 tablet (40 mg total) by mouth daily. ONLY FOR PRN SWELLING, Disp: 90 tablet, Rfl: 3 .  glucose blood (ONETOUCH VERIO) test strip, USE TO TEST BLOOD SUGAR THREE TIMES A DAY AS DIRECTED, Disp: 250 each, Rfl: 4 .  HUMALOG KWIKPEN 100 UNIT/ML  KiwkPen, INJECT 6 TO 10 UNITS BEFORE EACH MEAL THREE TIMES A DAY, Disp: 15 pen, Rfl: 0 .  ibuprofen (ADVIL,MOTRIN) 200 MG tablet, Take 200 mg by mouth every 6 (six) hours as needed., Disp: , Rfl:  .  Insulin Detemir (LEVEMIR FLEXTOUCH) 100 UNIT/ML Pen, INJECT 18 UNITS INTO THE SKIN DAILY AT 10 PM, Disp: 15 pen, Rfl: 3 .  Lancet Device MISC, by Does not apply route. UAD for TID blood glucose monitoring, Disp: , Rfl:  .  Magnesium 200 MG TABS, Take 1 tablet (200 mg total) by mouth daily., Disp: 30 each, Rfl: 11 .  meclizine (ANTIVERT) 25 MG tablet, Take 25 mg by mouth 2 (two) times daily., Disp: , Rfl:  .  metFORMIN (GLUCOPHAGE) 1000 MG tablet, TAKE ONE TABLET BY MOUTH TWICE A DAY WITH MEALS, Disp: 180 tablet, Rfl: 0 .  Multiple Vitamins-Minerals (CENTRUM SILVER PO), Take 1 tablet by mouth daily. , Disp: , Rfl:  .  Naproxen Sodium (ALEVE) 220 MG CAPS, Take by mouth as needed., Disp: , Rfl:  .  omeprazole (PRILOSEC) 20 MG capsule, TAKE ONE CAPSULE BY MOUTH DAILY, Disp: 90 capsule, Rfl: 1 .  ONETOUCH DELICA LANCETS 50K MISC, USE TO TEST BLOOD SUGAR THREE TIMES A DAY AS DIRECTED, Disp: 200 each, Rfl: 0 .  ONETOUCH VERIO test strip, USE TO TEST BLOOD SUGAR 3 TIMES A DAY AS DIRECTED, Disp: 200 each, Rfl: 3 .  OVER THE COUNTER MEDICATION, , Disp: , Rfl:  .  Propylene Glycol (SYSTANE BALANCE) 0.6 % SOLN, Apply to eye. 2 drops each eye once daily, Disp: , Rfl:  .  vitamin B-12 (CYANOCOBALAMIN) 1000 MCG tablet, Take 1,000 mcg by mouth daily., Disp: , Rfl:  .  pravastatin (PRAVACHOL) 40 MG tablet, Take 1 tablet (40 mg total) by mouth every evening., Disp: 90 tablet, Rfl: 3  EXAM:  Vitals:   06/04/18 1129  BP: 136/80  Pulse: 72  Temp: 98.4 F (36.9 C)    Body mass index is 28.41 kg/m.  GENERAL: vitals reviewed and listed above, alert, oriented, appears well hydrated and in no acute distress  HEENT: atraumatic, conjunttiva clear, no obvious abnormalities on inspection of external nose and  ears  NECK: no obvious masses on inspection  MS: Walks with a walker, does hunch his shoulders and tight in his neck with walking with a walker, slow and cautious gait, he has a left inferior SBS strain, OA rotated right, thoracic inlet rotated right, T2-T4 rotated right side bent left, lumbar paraspinal muscle tension right greater than left, pelvis rotated left, right tib-fib internally rotated  PSYCH: pleasant and cooperative, no obvious depression or anxiety  ASSESSMENT AND PLAN:  Discussed the following assessment and plan:  Neck pain  Somatic dysfunction of head region  Somatic dysfunction of cervical region  Somatic dysfunction of thoracic  region  Somatic dysfunction of lumbar region  Somatic dysfunction of pelvis region  Somatic dysfunction of lower extremity  -Discussed physical therapy and his walker and advised that he talk with the physical therapist to see if they can assist him in finding a posture that is less straining to his shoulders and neck when using his walker -He wants to do another osteopathic treatment today as this did seem to help some, see below.  eE discussed that we can try 3-6 sessions, and if making improvements, possibly more.  If not improving, would recommend he follow back up with his specialists or consider other options for pain management such as acupuncture or other.  PROCEDURE NOTE : OSTEOPATHIC TREATMENT The decision today to treat with gentle Osteopathic Manipulative Therapy  (OMT) was based on physical exam findings, diagnoses and patient wishes. Verbal consent was obtained after after explanation of risks and benefits. No Cervical HVLA manipulation was performed. After consent was obtained, treatment was  performed as below:      Regions treated: Head, cervical, thoracic, lumbar, pelvis, lower extremity     Techniques used: Myofascial release, muscle energy, cranial, BLT The patient tolerated the treatment well and reported  Improved  symptoms following treatment today. Follow up treatment was advised in: 1-2 weeks   -Patient advised to return or notify a doctor immediately if symptoms worsen or persist or new concerns arise.  There are no Patient Instructions on file for this visit.  [name], DO

## 2018-06-04 ENCOUNTER — Encounter: Payer: Self-pay | Admitting: Family Medicine

## 2018-06-04 ENCOUNTER — Ambulatory Visit: Payer: Medicare Other | Admitting: Family Medicine

## 2018-06-04 VITALS — BP 136/80 | HR 72 | Temp 98.4°F | Ht 70.0 in | Wt 198.0 lb

## 2018-06-04 DIAGNOSIS — M9900 Segmental and somatic dysfunction of head region: Secondary | ICD-10-CM

## 2018-06-04 DIAGNOSIS — M9905 Segmental and somatic dysfunction of pelvic region: Secondary | ICD-10-CM

## 2018-06-04 DIAGNOSIS — M9901 Segmental and somatic dysfunction of cervical region: Secondary | ICD-10-CM | POA: Diagnosis not present

## 2018-06-04 DIAGNOSIS — M542 Cervicalgia: Secondary | ICD-10-CM

## 2018-06-04 DIAGNOSIS — M9906 Segmental and somatic dysfunction of lower extremity: Secondary | ICD-10-CM

## 2018-06-04 DIAGNOSIS — M9902 Segmental and somatic dysfunction of thoracic region: Secondary | ICD-10-CM

## 2018-06-04 DIAGNOSIS — M9903 Segmental and somatic dysfunction of lumbar region: Secondary | ICD-10-CM | POA: Diagnosis not present

## 2018-06-08 ENCOUNTER — Other Ambulatory Visit: Payer: Self-pay | Admitting: Internal Medicine

## 2018-06-18 ENCOUNTER — Encounter: Payer: Self-pay | Admitting: Family Medicine

## 2018-06-19 ENCOUNTER — Telehealth: Payer: Self-pay | Admitting: *Deleted

## 2018-06-19 NOTE — Telephone Encounter (Signed)
Copied from [place] [id number]. Topic: Quick Communication - Appointment Cancellation >> Jun 19, 2018  3:45 PM Rutherford Nail, [place] wrote: Patient called to cancel appointment scheduled for 06/20/18. Patient has not rescheduled their appointment. Would like for Joann to give him a call to reschedule this appointment.  Route to department's PEC pool.

## 2018-06-19 NOTE — Progress Notes (Deleted)
HPI:  Using dictation device. Unfortunately this device frequently misinterprets words/phrases.  Follow up Neck Pain: -has seen several neurosurgeons and currently seeing Dr. Nelva Bush - PMR, last MRI 08/2017 w/ sig DDD -has tried injections, tramadol, now PT -was told surgery won't help, Was starting PT at last visit -felt OMM helped some -today reports: -denies: -hx extensive DJD/OA with hx bilat knee replacements, hx low back surgery with Dr. Arnoldo Morale -uses walker  ROS: See pertinent positives and negatives per HPI.  Past Medical History:  Diagnosis Date  . Anemia   . Atherosclerotic PVD with intermittent claudication ([place])   . CAD (coronary artery disease)   . Diabetes mellitus    type 2 with peripheral neuropathy  . Diastolic CHF, chronic ([place])   . Diverticulosis   . DVT (deep venous thrombosis) ([place])   . Dyslipidemia   . Edema 08/19/2014  . GERD (gastroesophageal reflux disease)    hx hiatal hernia  . HTN (hypertension)   . IBS (irritable bowel syndrome)    Hx: of  . Osteoarthritis   . Overactive bladder    Hx: of stress incontinence  . Pneumonia 2002   history of  . Shingles   . Varicose veins of lower extremities with other complications 2/59/5638    Past Surgical History:  Procedure Laterality Date  . BACK SURGERY    . CATARACT EXTRACTION Bilateral 08/10/2015,08/15/2015  . COLONOSCOPY     Hx: of  . CTS bilateral    . KNEE ARTHROSCOPY  1988, 1992   bilateral  . LAMINECTOMY  08/18/2013   DECOMPRESSIVE LAMINECTOMIES  . LEFT AND RIGHT HEART CATHETERIZATION WITH CORONARY ANGIOGRAM N/A 09/03/2014   Procedure: LEFT AND RIGHT HEART CATHETERIZATION WITH CORONARY ANGIOGRAM;  Surgeon: [name], MD;  Location: [facility] CATH LAB;  Service: Cardiovascular;  Laterality: N/A;  . LUMBAR LAMINECTOMY/DECOMPRESSION MICRODISCECTOMY N/A 08/18/2013   Procedure: Lumbar two-three, lumbar three-four, lumbar four-five decompressive laminectomies;  Surgeon: [name], MD;   Location: [place] NEURO ORS;  Service: Neurosurgery;  Laterality: N/A;  . lumbar left arm  1961  . TONSILLECTOMY    . TOTAL KNEE ARTHROPLASTY  2005, 2007  . wrist tendon surgery      Family History  Problem Relation Age of Onset  . Other Mother 101       CABG  . Heart disease Mother   . Hypertension Mother   . Hyperlipidemia Son     SOCIAL HX: ***   Current Outpatient Medications:  .  amLODipine (NORVASC) 5 MG tablet, TAKE ONE TABLET BY MOUTH DAILY, Disp: 90 tablet, Rfl: 3 .  aspirin 81 MG tablet, Take 81 mg by mouth daily. , Disp: , Rfl:  .  B-D UF III MINI PEN NEEDLES 31G X 5 MM MISC, USE TO TEST BLOOD SUGAR FOUR TIMES A DAY, Disp: 200 each, Rfl: 0 .  furosemide (LASIX) 40 MG tablet, Take 1 tablet (40 mg total) by mouth daily. ONLY FOR PRN SWELLING, Disp: 90 tablet, Rfl: 3 .  glucose blood (ONETOUCH VERIO) test strip, USE TO TEST BLOOD SUGAR THREE TIMES A DAY AS DIRECTED, Disp: 250 each, Rfl: 4 .  HUMALOG KWIKPEN 100 UNIT/ML KiwkPen, INJECT 6 TO 10 UNITS BEFORE EACH MEAL THREE TIMES A DAY, Disp: 15 pen, Rfl: 0 .  ibuprofen (ADVIL,MOTRIN) 200 MG tablet, Take 200 mg by mouth every 6 (six) hours as needed., Disp: , Rfl:  .  Insulin Detemir (LEVEMIR FLEXTOUCH) 100 UNIT/ML Pen, INJECT 18 UNITS INTO THE SKIN DAILY AT 10  PM, Disp: 15 pen, Rfl: 3 .  Lancet Device MISC, by Does not apply route. UAD for TID blood glucose monitoring, Disp: , Rfl:  .  Magnesium 200 MG TABS, Take 1 tablet (200 mg total) by mouth daily., Disp: 30 each, Rfl: 11 .  meclizine (ANTIVERT) 25 MG tablet, Take 25 mg by mouth 2 (two) times daily., Disp: , Rfl:  .  metFORMIN (GLUCOPHAGE) 1000 MG tablet, TAKE ONE TABLET BY MOUTH TWICE A DAY WITH MEALS, Disp: 180 tablet, Rfl: 0 .  Multiple Vitamins-Minerals (CENTRUM SILVER PO), Take 1 tablet by mouth daily. , Disp: , Rfl:  .  Naproxen Sodium (ALEVE) 220 MG CAPS, Take by mouth as needed., Disp: , Rfl:  .  omeprazole (PRILOSEC) 20 MG capsule, TAKE ONE CAPSULE BY MOUTH DAILY, Disp:  90 capsule, Rfl: 1 .  ONETOUCH DELICA LANCETS 64Q MISC, USE TO TEST BLOOD SUGAR THREE TIMES A DAY AS DIRECTED, Disp: 200 each, Rfl: 0 .  ONETOUCH VERIO test strip, USE TO TEST BLOOD SUGAR 3 TIMES A DAY AS DIRECTED, Disp: 200 each, Rfl: 3 .  OVER THE COUNTER MEDICATION, , Disp: , Rfl:  .  pravastatin (PRAVACHOL) 40 MG tablet, Take 1 tablet (40 mg total) by mouth every evening., Disp: 90 tablet, Rfl: 3 .  Propylene Glycol (SYSTANE BALANCE) 0.6 % SOLN, Apply to eye. 2 drops each eye once daily, Disp: , Rfl:  .  vitamin B-12 (CYANOCOBALAMIN) 1000 MCG tablet, Take 1,000 mcg by mouth daily., Disp: , Rfl:   EXAM:  There were no vitals filed for this visit.  There is no height or weight on file to calculate BMI.  GENERAL: vitals reviewed and listed above, alert, oriented, appears well hydrated and in no acute distress  HEENT: atraumatic, conjunttiva clear, no obvious abnormalities on inspection of external nose and ears  NECK: no obvious masses on inspection  LUNGS: clear to auscultation bilaterally, no wheezes, rales or rhonchi, good air movement  CV: HRRR, no peripheral edema  MS: moves all extremities without noticeable abnormality *** PSYCH: pleasant and cooperative, no obvious depression or anxiety  ASSESSMENT AND PLAN:  Discussed the following assessment and plan:  No diagnosis found.  *** -Patient advised to return or notify a doctor immediately if symptoms worsen or persist or new concerns arise.  There are no Patient Instructions on file for this visit.  [name], DO

## 2018-06-20 ENCOUNTER — Ambulatory Visit: Payer: Medicare Other | Admitting: Family Medicine

## 2018-06-20 NOTE — Telephone Encounter (Signed)
I called the pt and scheduled an appt for 7/16.

## 2018-06-24 NOTE — Progress Notes (Signed)
HPI:  Using dictation device. Unfortunately this device frequently misinterprets words/phrases.  [name] is a pleasant 84 yo with a PMH DM, HTN, HLD, anemia, GERD, OAB, CHF, and CAD here for follow up for neck pain: -chronic -has seen [facility] and was told surgery would not help -sees Dr. Nelva Bush, PMR - but feels treatments have not helped -started PT with [facility] on our recommendation - reports has 1 more physical therapy session later this week.  He did not like the massage that they did on his neck.  He has been doing the exercises for strength and posture. -felt better after OMM in the past, reports today is doing better overall, baseline constant pain has gone down from a 6 to less than a 1 out of 10 pain level, he has a 4 out of 10 pain level with certain movements of his head to the right -He is considering acupuncture -Occasional tingling in his hands, not new, not worsening, history of carpal tunnel syndrome  Prior history initial visit: -chronic -sees Dr. Nelva Bush, pmr - feels treatments have not helped -reports saw several neurosurgeons and was told surgery would not help -pain intermittent, triggered by turning head to the R, R sided, sharp - also with standing up with walker; recent L great toe fx (reports treated with boot x 6 weeks, recently out of boot), some R low back pain last week, now resolved -hx bilat knee replacements, hx low back surgery with Dr. Arnoldo Morale ([facility]) -denies:radiation, weakness, numbness - though occ tingly R 1-2nd fingers with increased walker use -prior treatments: tramadol, injections, low back surgery -brings MRI report from [facility] from 08/2017 - see scanned report, sig deg changes    ROS: See pertinent positives and negatives per HPI.  Past Medical History:  Diagnosis Date  . Anemia   . Atherosclerotic PVD with intermittent claudication ([place])   . CAD (coronary artery disease)   . Diabetes mellitus    type 2 with peripheral neuropathy  .  Diastolic CHF, chronic ([place])   . Diverticulosis   . DVT (deep venous thrombosis) ([place])   . Dyslipidemia   . Edema 08/19/2014  . GERD (gastroesophageal reflux disease)    hx hiatal hernia  . HTN (hypertension)   . IBS (irritable bowel syndrome)    Hx: of  . Osteoarthritis   . Overactive bladder    Hx: of stress incontinence  . Pneumonia 2002   history of  . Shingles   . Varicose veins of lower extremities with other complications 2/87/8676    Past Surgical History:  Procedure Laterality Date  . BACK SURGERY    . CATARACT EXTRACTION Bilateral 08/10/2015,08/15/2015  . COLONOSCOPY     Hx: of  . CTS bilateral    . KNEE ARTHROSCOPY  1988, 1992   bilateral  . LAMINECTOMY  08/18/2013   DECOMPRESSIVE LAMINECTOMIES  . LEFT AND RIGHT HEART CATHETERIZATION WITH CORONARY ANGIOGRAM N/A 09/03/2014   Procedure: LEFT AND RIGHT HEART CATHETERIZATION WITH CORONARY ANGIOGRAM;  Surgeon: [name], MD;  Location: [facility] CATH LAB;  Service: Cardiovascular;  Laterality: N/A;  . LUMBAR LAMINECTOMY/DECOMPRESSION MICRODISCECTOMY N/A 08/18/2013   Procedure: Lumbar two-three, lumbar three-four, lumbar four-five decompressive laminectomies;  Surgeon: [name], MD;  Location: [facility] NEURO ORS;  Service: Neurosurgery;  Laterality: N/A;  . lumbar left arm  1961  . TONSILLECTOMY    . TOTAL KNEE ARTHROPLASTY  2005, 2007  . wrist tendon surgery      Family History  Problem  Relation Age of Onset  . Other Mother 69       CABG  . Heart disease Mother   . Hypertension Mother   . Hyperlipidemia Son     SOCIAL HX: See HPI   Current Outpatient Medications:  .  amLODipine (NORVASC) 5 MG tablet, TAKE ONE TABLET BY MOUTH DAILY, Disp: 90 tablet, Rfl: 3 .  aspirin 81 MG tablet, Take 81 mg by mouth daily. , Disp: , Rfl:  .  B-D UF III MINI PEN NEEDLES 31G X 5 MM MISC, USE TO TEST BLOOD SUGAR FOUR TIMES A DAY, Disp: 200 each, Rfl: 0 .  furosemide (LASIX) 40 MG tablet, Take 1 tablet (40 mg total) by mouth  daily. ONLY FOR PRN SWELLING, Disp: 90 tablet, Rfl: 3 .  glucose blood (ONETOUCH VERIO) test strip, USE TO TEST BLOOD SUGAR THREE TIMES A DAY AS DIRECTED, Disp: 250 each, Rfl: 4 .  HUMALOG KWIKPEN 100 UNIT/ML KiwkPen, INJECT 6 TO 10 UNITS BEFORE EACH MEAL THREE TIMES A DAY, Disp: 15 pen, Rfl: 0 .  ibuprofen (ADVIL,MOTRIN) 200 MG tablet, Take 200 mg by mouth every 6 (six) hours as needed., Disp: , Rfl:  .  Insulin Detemir (LEVEMIR FLEXTOUCH) 100 UNIT/ML Pen, INJECT 18 UNITS INTO THE SKIN DAILY AT 10 PM, Disp: 15 pen, Rfl: 3 .  Lancet Device MISC, by Does not apply route. UAD for TID blood glucose monitoring, Disp: , Rfl:  .  Magnesium 200 MG TABS, Take 1 tablet (200 mg total) by mouth daily., Disp: 30 each, Rfl: 11 .  meclizine (ANTIVERT) 25 MG tablet, Take 25 mg by mouth 2 (two) times daily., Disp: , Rfl:  .  metFORMIN (GLUCOPHAGE) 1000 MG tablet, TAKE ONE TABLET BY MOUTH TWICE A DAY WITH MEALS, Disp: 180 tablet, Rfl: 0 .  Multiple Vitamins-Minerals (CENTRUM SILVER PO), Take 1 tablet by mouth daily. , Disp: , Rfl:  .  Naproxen Sodium (ALEVE) 220 MG CAPS, Take by mouth as needed., Disp: , Rfl:  .  omeprazole (PRILOSEC) 20 MG capsule, TAKE ONE CAPSULE BY MOUTH DAILY, Disp: 90 capsule, Rfl: 1 .  ONETOUCH DELICA LANCETS 40J MISC, USE TO TEST BLOOD SUGAR THREE TIMES A DAY AS DIRECTED, Disp: 200 each, Rfl: 0 .  ONETOUCH VERIO test strip, USE TO TEST BLOOD SUGAR 3 TIMES A DAY AS DIRECTED, Disp: 200 each, Rfl: 3 .  OVER THE COUNTER MEDICATION, , Disp: , Rfl:  .  Propylene Glycol (SYSTANE BALANCE) 0.6 % SOLN, Apply to eye. 2 drops each eye once daily, Disp: , Rfl:  .  vitamin B-12 (CYANOCOBALAMIN) 1000 MCG tablet, Take 1,000 mcg by mouth daily., Disp: , Rfl:  .  pravastatin (PRAVACHOL) 40 MG tablet, Take 1 tablet (40 mg total) by mouth every evening., Disp: 90 tablet, Rfl: 3  EXAM:  Vitals:   06/25/18 1255  BP: 124/78  Pulse: 68  Temp: 98.3 F (36.8 C)    Body mass index is 28.41  kg/m.  GENERAL: vitals reviewed and listed above, alert, oriented, appears well hydrated and in no acute distress  HEENT: atraumatic, conjunttiva clear, no obvious abnormalities on inspection of external nose and ears  NECK: no obvious masses on inspection  LUNGS: clear to auscultation bilaterally, no wheezes, rales or rhonchi, good air movement  CV: HRRR, no peripheral edema  MS: moves all extremities without noticeable abnormality, walks with his walker, loss of cervical curve in the neck with head and shoulders forward posture, suboccipital muscle tension with tenderness to  palpation in the suboccipital and cervical paraspinal muscles that is mild, T2-4 rotated right side bent left, suboccipital muscle tension, OA is rotated left, thoracic inlet is rotated left, right side bending rotation SBS strain, right anterior innominate, surgical scar on the lumbar spine  PSYCH: pleasant and cooperative, no obvious depression or anxiety  ASSESSMENT AND PLAN:  Discussed the following assessment and plan:  Neck pain  Cervical (neck) region somatic dysfunction  Somatic dysfunction of head region  Somatic dysfunction of spine, thoracic  Somatic dysfunction of pelvis region  -He went to my thoughts on doing the physical therapy this week, he is not sure it is helping -advised to go ahead and do one more visit and ask for  home exercises he can continue for postural support and strengthening with walker use -He is considering acupuncture and I did let him know several-providers in the area, including the acupuncturist I see for migraines -He wanted to do-OMM today, he feels that has helped, see below -Follow-up in about 2 weeks per his preference -Patient advised to return or notify a doctor immediately if symptoms worsen or persist or new concerns arise.  PROCEDURE NOTE : OSTEOPATHIC TREATMENT The decision today to treat with gentle Osteopathic Manipulative Therapy  (OMT) was based on  physical exam findings, diagnoses and patient wishes. Verbal consent was obtained after after explanation of risks and benefits. No Cervical HVLA manipulation was performed. After consent was obtained, treatment was  performed as below:      Regions treated:  Head, neck, thoracic, pelvis     Techniques used: ME, MR, BLT, Cranial The patient tolerated the treatment well and reported Improved  symptoms following treatment today. Follow up treatment was advised in: 1-2 weeks   There are no Patient Instructions on file for this visit.  [name], DO

## 2018-06-25 ENCOUNTER — Ambulatory Visit: Payer: Medicare Other | Admitting: Family Medicine

## 2018-06-25 ENCOUNTER — Encounter: Payer: Self-pay | Admitting: Family Medicine

## 2018-06-25 VITALS — BP 124/78 | HR 68 | Temp 98.3°F | Ht 70.0 in

## 2018-06-25 DIAGNOSIS — M9901 Segmental and somatic dysfunction of cervical region: Secondary | ICD-10-CM | POA: Diagnosis not present

## 2018-06-25 DIAGNOSIS — M9902 Segmental and somatic dysfunction of thoracic region: Secondary | ICD-10-CM | POA: Diagnosis not present

## 2018-06-25 DIAGNOSIS — M9900 Segmental and somatic dysfunction of head region: Secondary | ICD-10-CM

## 2018-06-25 DIAGNOSIS — M542 Cervicalgia: Secondary | ICD-10-CM

## 2018-06-25 DIAGNOSIS — M9905 Segmental and somatic dysfunction of pelvic region: Secondary | ICD-10-CM

## 2018-06-30 ENCOUNTER — Other Ambulatory Visit: Payer: Self-pay | Admitting: Internal Medicine

## 2018-07-02 NOTE — Progress Notes (Signed)
HPI:  Using dictation device. Unfortunately this device frequently misinterprets words/phrases.  Acute visit for neck pain: -chronic - see hx below -symptoms: chronic neck pain, today reports actually doing much better, he feels OMM has helped, he took my recommendations to request home exercises he can do for postural support from his PT and feels these are helping as well. He has an appt set up for accupuncture. Has not required any medications for pain. -denies: worsening, neck pain, back pain, hip pain today, malaise  Prior history initial visit: -chronic -sees Dr. Nelva Bush, pmr - feels pmr  treatments have not helped -reports saw several neurosurgeons and was told surgery would not help -pain intermittent, triggered by turning head to the R, R sided, sharp - also with standing up with walker; recent L great toe fx (reports treated with boot x 6 weeks, recently out of boot), some R low back pain last week, now resolved -hx bilat knee replacements, hx low back surgery with Dr. Arnoldo Morale ([facility]) -denies:radiation, weakness, numbness - though occ tingly R 1-2nd fingers with increased walker use -feels OMM  Has helped -prior treatments: tramadol, injections, low back surgery -brings MRI report from [facility] from 08/2017 - see scanned report, sig deg changes  ROS: See pertinent positives and negatives per HPI.  Past Medical History:  Diagnosis Date  . Anemia   . Atherosclerotic PVD with intermittent claudication ([place])   . CAD (coronary artery disease)   . Diabetes mellitus    type 2 with peripheral neuropathy  . Diastolic CHF, chronic ([place])   . Diverticulosis   . DVT (deep venous thrombosis) ([place])   . Dyslipidemia   . Edema 08/19/2014  . GERD (gastroesophageal reflux disease)    hx hiatal hernia  . HTN (hypertension)   . IBS (irritable bowel syndrome)    Hx: of  . Osteoarthritis   . Overactive bladder    Hx: of stress incontinence  . Pneumonia 2002   history of  . Shingles   .  Varicose veins of lower extremities with other complications 08/08/5620    Past Surgical History:  Procedure Laterality Date  . BACK SURGERY    . CATARACT EXTRACTION Bilateral 08/10/2015,08/15/2015  . COLONOSCOPY     Hx: of  . CTS bilateral    . KNEE ARTHROSCOPY  1988, 1992   bilateral  . LAMINECTOMY  08/18/2013   DECOMPRESSIVE LAMINECTOMIES  . LEFT AND RIGHT HEART CATHETERIZATION WITH CORONARY ANGIOGRAM N/A 09/03/2014   Procedure: LEFT AND RIGHT HEART CATHETERIZATION WITH CORONARY ANGIOGRAM;  Surgeon: [name], MD;  Location: [facility] CATH LAB;  Service: Cardiovascular;  Laterality: N/A;  . LUMBAR LAMINECTOMY/DECOMPRESSION MICRODISCECTOMY N/A 08/18/2013   Procedure: Lumbar two-three, lumbar three-four, lumbar four-five decompressive laminectomies;  Surgeon: [name], MD;  Location: [place] NEURO ORS;  Service: Neurosurgery;  Laterality: N/A;  . lumbar left arm  1961  . TONSILLECTOMY    . TOTAL KNEE ARTHROPLASTY  2005, 2007  . wrist tendon surgery      Family History  Problem Relation Age of Onset  . Other Mother 43       CABG  . Heart disease Mother   . Hypertension Mother   . Hyperlipidemia Son     SOCIAL HX: see hpi   Current Outpatient Medications:  .  amLODipine (NORVASC) 5 MG tablet, TAKE ONE TABLET BY MOUTH DAILY, Disp: 90 tablet, Rfl: 3 .  aspirin 81 MG tablet, Take 81 mg by mouth daily. , Disp: , Rfl:  .  B-D UF III MINI PEN NEEDLES 31G X 5 MM MISC, USE TO TEST BLOOD SUGAR FOUR TIMES A DAY, Disp: 200 each, Rfl: 0 .  furosemide (LASIX) 40 MG tablet, Take 1 tablet (40 mg total) by mouth daily. ONLY FOR PRN SWELLING, Disp: 90 tablet, Rfl: 3 .  glucose blood (ONETOUCH VERIO) test strip, USE TO TEST BLOOD SUGAR THREE TIMES A DAY AS DIRECTED, Disp: 250 each, Rfl: 4 .  HUMALOG KWIKPEN 100 UNIT/ML KiwkPen, INJECT 6 TO 10 UNITS INTO THE SKIN BEFORE EACH MEAL THREE TIMES A DAY, Disp: 15 pen, Rfl: 0 .  ibuprofen (ADVIL,MOTRIN) 200 MG tablet, Take 200 mg by mouth every 6 (six)  hours as needed., Disp: , Rfl:  .  Insulin Detemir (LEVEMIR FLEXTOUCH) 100 UNIT/ML Pen, INJECT 18 UNITS INTO THE SKIN DAILY AT 10 PM, Disp: 15 pen, Rfl: 3 .  Lancet Device MISC, by Does not apply route. UAD for TID blood glucose monitoring, Disp: , Rfl:  .  LEVEMIR FLEXTOUCH 100 UNIT/ML Pen, INJECT 20 UNITS INTO THE SKIN DAILY AT 10PM, Disp: 15 pen, Rfl: 0 .  Magnesium 200 MG TABS, Take 1 tablet (200 mg total) by mouth daily., Disp: 30 each, Rfl: 11 .  meclizine (ANTIVERT) 25 MG tablet, Take 25 mg by mouth 2 (two) times daily., Disp: , Rfl:  .  metFORMIN (GLUCOPHAGE) 1000 MG tablet, TAKE ONE TABLET BY MOUTH TWICE A DAY WITH MEALS, Disp: 180 tablet, Rfl: 0 .  Multiple Vitamins-Minerals (CENTRUM SILVER PO), Take 1 tablet by mouth daily. , Disp: , Rfl:  .  Naproxen Sodium (ALEVE) 220 MG CAPS, Take by mouth as needed., Disp: , Rfl:  .  omeprazole (PRILOSEC) 20 MG capsule, TAKE ONE CAPSULE BY MOUTH DAILY, Disp: 90 capsule, Rfl: 1 .  ONETOUCH DELICA LANCETS 31D MISC, USE TO TEST BLOOD SUGAR THREE TIMES A DAY AS DIRECTED, Disp: 200 each, Rfl: 0 .  ONETOUCH VERIO test strip, USE TO TEST BLOOD SUGAR 3 TIMES A DAY AS DIRECTED, Disp: 200 each, Rfl: 3 .  OVER THE COUNTER MEDICATION, , Disp: , Rfl:  .  Propylene Glycol (SYSTANE BALANCE) 0.6 % SOLN, Apply to eye. 2 drops each eye once daily, Disp: , Rfl:  .  vitamin B-12 (CYANOCOBALAMIN) 1000 MCG tablet, Take 1,000 mcg by mouth daily., Disp: , Rfl:  .  pravastatin (PRAVACHOL) 40 MG tablet, Take 1 tablet (40 mg total) by mouth every evening., Disp: 90 tablet, Rfl: 3  EXAM:  Vitals:   07/04/18 1122  BP: 108/60  Pulse: 65  Temp: 97.8 F (36.6 C)    Body mass index is 28.74 kg/m.  GENERAL: vitals reviewed and listed above, alert, oriented, appears well hydrated and in no acute distress  HEENT: atraumatic, conjunttiva clear, no obvious abnormalities on inspection of external nose and ears  NECK: no obvious masses on inspection  MS: walks cautiously  with his walker, his posture has improved some with mild improvement in kyphosis, R ant innominate with mild L3-L5 RrSl, TI RL, cervical paraspinal muslce tension with OA R, R SBS SR  PSYCH: pleasant and cooperative, no obvious depression or anxiety  ASSESSMENT AND PLAN:  Discussed the following assessment and plan:  Chronic neck pain  Cervical (neck) region somatic dysfunction  Somatic dysfunction of head region  Somatic dysfunction of spine, thoracic  Somatic dysfunction of spine, lumbar  Somatic dysfunction of pelvic region  -OMM per below -discussed plans for maintaining the progress he has made, advised continuation of the home  exercises and gentle regular activity as tolerated -discussed could do OMM follow up as needed since symptoms resolved -advised regular medical follow up in 1-2 months for diabetes and other chronic issues  PROCEDURE NOTE : OSTEOPATHIC TREATMENT The decision today to treat with gentle Osteopathic Manipulative Therapy  (OMT) was based on physical exam findings, diagnoses and patient wishes. Verbal consent was obtained after after explanation of risks and benefits. No Cervical HVLA manipulation was performed. After consent was obtained, treatment was  performed as below:      Regions treated:  Head, cervical, thoracic, lumbar, pelvic     Techniques used: MR, ME, cranial, BLT The patient tolerated the treatment well. Follow up treatment was advised in:  As needed   -Patient advised to return or notify a doctor immediately if symptoms worsen or persist or new concerns arise.  Patient Instructions  Cont home exercises for postural support.  Regular medical follow up in Sept or Oct  OMM follow up as needed   [name], DO

## 2018-07-04 ENCOUNTER — Encounter: Payer: Self-pay | Admitting: Family Medicine

## 2018-07-04 ENCOUNTER — Ambulatory Visit: Payer: Medicare Other | Admitting: Family Medicine

## 2018-07-04 VITALS — BP 108/60 | HR 65 | Temp 97.8°F | Ht 70.0 in | Wt 200.3 lb

## 2018-07-04 DIAGNOSIS — M9903 Segmental and somatic dysfunction of lumbar region: Secondary | ICD-10-CM | POA: Diagnosis not present

## 2018-07-04 DIAGNOSIS — M9901 Segmental and somatic dysfunction of cervical region: Secondary | ICD-10-CM

## 2018-07-04 DIAGNOSIS — M9900 Segmental and somatic dysfunction of head region: Secondary | ICD-10-CM | POA: Diagnosis not present

## 2018-07-04 DIAGNOSIS — M542 Cervicalgia: Secondary | ICD-10-CM | POA: Diagnosis not present

## 2018-07-04 DIAGNOSIS — M9902 Segmental and somatic dysfunction of thoracic region: Secondary | ICD-10-CM

## 2018-07-04 DIAGNOSIS — G8929 Other chronic pain: Secondary | ICD-10-CM

## 2018-07-04 DIAGNOSIS — M9905 Segmental and somatic dysfunction of pelvic region: Secondary | ICD-10-CM

## 2018-07-04 NOTE — Patient Instructions (Addendum)
Cont home exercises for postural support.  Regular medical follow up in Sept or Oct  OMM follow up as needed

## 2018-07-16 ENCOUNTER — Ambulatory Visit: Payer: Medicare Other | Admitting: Family Medicine

## 2018-07-16 ENCOUNTER — Encounter: Payer: Self-pay | Admitting: Family Medicine

## 2018-07-16 VITALS — BP 138/80 | HR 62 | Temp 98.2°F | Ht 70.0 in

## 2018-07-16 DIAGNOSIS — M9900 Segmental and somatic dysfunction of head region: Secondary | ICD-10-CM | POA: Diagnosis not present

## 2018-07-16 DIAGNOSIS — M9903 Segmental and somatic dysfunction of lumbar region: Secondary | ICD-10-CM

## 2018-07-16 DIAGNOSIS — M9902 Segmental and somatic dysfunction of thoracic region: Secondary | ICD-10-CM | POA: Diagnosis not present

## 2018-07-16 DIAGNOSIS — M9901 Segmental and somatic dysfunction of cervical region: Secondary | ICD-10-CM

## 2018-07-16 DIAGNOSIS — M9905 Segmental and somatic dysfunction of pelvic region: Secondary | ICD-10-CM

## 2018-07-16 DIAGNOSIS — G8929 Other chronic pain: Secondary | ICD-10-CM

## 2018-07-16 DIAGNOSIS — M542 Cervicalgia: Secondary | ICD-10-CM

## 2018-07-16 NOTE — Progress Notes (Signed)
HPI:  Using dictation device. Unfortunately this device frequently misinterprets words/phrases.  Follow up chronic neck pain: -has seen a number of specialist in the past -reports has had > 50% reduction in symptoms with OMM and wants to continue as feels has really helped -continue PT home exercises -today reports mild pain in R neck, still with some paresthesias in hands at times -tried acupuncture recently -no worsening, fevers, weakness, malaise  Prior history initial visit: -chronic -sees Dr. Nelva Bush, pmr - feels pmr  treatments have not helped -reports saw several neurosurgeons and was told surgery would not help -pain intermittent, triggered by turning head to the R, R sided, sharp - also with standing up with walker; recent L great toe fx (reports treated with boot x 6 weeks, recently out of boot), some R low back pain last week, now resolved -hx bilat knee replacements, hx low back surgery with Dr. Arnoldo Morale ([facility]) -denies:radiation, weakness, numbness - though occ tingly R 1-2nd fingers with increased walker use -feels OMM  Has helped -prior treatments: tramadol, injections, low back surgery -brings MRI report from [facility] from 08/2017 - see scanned report, sig deg changes   ROS: See pertinent positives and negatives per HPI.  Past Medical History:  Diagnosis Date  . Anemia   . Atherosclerotic PVD with intermittent claudication ([place])   . CAD (coronary artery disease)   . Diabetes mellitus    type 2 with peripheral neuropathy  . Diastolic CHF, chronic ([place])   . Diverticulosis   . DVT (deep venous thrombosis) ([place])   . Dyslipidemia   . Edema 08/19/2014  . GERD (gastroesophageal reflux disease)    hx hiatal hernia  . HTN (hypertension)   . IBS (irritable bowel syndrome)    Hx: of  . Osteoarthritis   . Overactive bladder    Hx: of stress incontinence  . Pneumonia 2002   history of  . Shingles   . Varicose veins of lower extremities with other complications  9/60/4540    Past Surgical History:  Procedure Laterality Date  . BACK SURGERY    . CATARACT EXTRACTION Bilateral 08/10/2015,08/15/2015  . COLONOSCOPY     Hx: of  . CTS bilateral    . KNEE ARTHROSCOPY  1988, 1992   bilateral  . LAMINECTOMY  08/18/2013   DECOMPRESSIVE LAMINECTOMIES  . LEFT AND RIGHT HEART CATHETERIZATION WITH CORONARY ANGIOGRAM N/A 09/03/2014   Procedure: LEFT AND RIGHT HEART CATHETERIZATION WITH CORONARY ANGIOGRAM;  Surgeon: [name], MD;  Location: [facility] CATH LAB;  Service: Cardiovascular;  Laterality: N/A;  . LUMBAR LAMINECTOMY/DECOMPRESSION MICRODISCECTOMY N/A 08/18/2013   Procedure: Lumbar two-three, lumbar three-four, lumbar four-five decompressive laminectomies;  Surgeon: [name], MD;  Location: [place] NEURO ORS;  Service: Neurosurgery;  Laterality: N/A;  . lumbar left arm  1961  . TONSILLECTOMY    . TOTAL KNEE ARTHROPLASTY  2005, 2007  . wrist tendon surgery      Family History  Problem Relation Age of Onset  . Other Mother 17       CABG  . Heart disease Mother   . Hypertension Mother   . Hyperlipidemia Son     SOCIAL HX: see hpi   Current Outpatient Medications:  .  amLODipine (NORVASC) 5 MG tablet, TAKE ONE TABLET BY MOUTH DAILY, Disp: 90 tablet, Rfl: 3 .  aspirin 81 MG tablet, Take 81 mg by mouth daily. , Disp: , Rfl:  .  B-D UF III MINI PEN NEEDLES 31G X 5 MM MISC, USE  TO TEST BLOOD SUGAR FOUR TIMES A DAY, Disp: 200 each, Rfl: 0 .  furosemide (LASIX) 40 MG tablet, Take 1 tablet (40 mg total) by mouth daily. ONLY FOR PRN SWELLING, Disp: 90 tablet, Rfl: 3 .  glucose blood (ONETOUCH VERIO) test strip, USE TO TEST BLOOD SUGAR THREE TIMES A DAY AS DIRECTED, Disp: 250 each, Rfl: 4 .  HUMALOG KWIKPEN 100 UNIT/ML KiwkPen, INJECT 6 TO 10 UNITS INTO THE SKIN BEFORE EACH MEAL THREE TIMES A DAY, Disp: 15 pen, Rfl: 0 .  ibuprofen (ADVIL,MOTRIN) 200 MG tablet, Take 200 mg by mouth every 6 (six) hours as needed., Disp: , Rfl:  .  Insulin Detemir (LEVEMIR  FLEXTOUCH) 100 UNIT/ML Pen, INJECT 18 UNITS INTO THE SKIN DAILY AT 10 PM, Disp: 15 pen, Rfl: 3 .  Lancet Device MISC, by Does not apply route. UAD for TID blood glucose monitoring, Disp: , Rfl:  .  LEVEMIR FLEXTOUCH 100 UNIT/ML Pen, INJECT 20 UNITS INTO THE SKIN DAILY AT 10PM, Disp: 15 pen, Rfl: 0 .  Magnesium 200 MG TABS, Take 1 tablet (200 mg total) by mouth daily., Disp: 30 each, Rfl: 11 .  meclizine (ANTIVERT) 25 MG tablet, Take 25 mg by mouth 2 (two) times daily., Disp: , Rfl:  .  metFORMIN (GLUCOPHAGE) 1000 MG tablet, TAKE ONE TABLET BY MOUTH TWICE A DAY WITH MEALS, Disp: 180 tablet, Rfl: 0 .  Multiple Vitamins-Minerals (CENTRUM SILVER PO), Take 1 tablet by mouth daily. , Disp: , Rfl:  .  Naproxen Sodium (ALEVE) 220 MG CAPS, Take by mouth as needed., Disp: , Rfl:  .  omeprazole (PRILOSEC) 20 MG capsule, TAKE ONE CAPSULE BY MOUTH DAILY, Disp: 90 capsule, Rfl: 1 .  ONETOUCH DELICA LANCETS 81X MISC, USE TO TEST BLOOD SUGAR THREE TIMES A DAY AS DIRECTED, Disp: 200 each, Rfl: 0 .  ONETOUCH VERIO test strip, USE TO TEST BLOOD SUGAR 3 TIMES A DAY AS DIRECTED, Disp: 200 each, Rfl: 3 .  OVER THE COUNTER MEDICATION, , Disp: , Rfl:  .  Propylene Glycol (SYSTANE BALANCE) 0.6 % SOLN, Apply to eye. 2 drops each eye once daily, Disp: , Rfl:  .  vitamin B-12 (CYANOCOBALAMIN) 1000 MCG tablet, Take 1,000 mcg by mouth daily., Disp: , Rfl:  .  pravastatin (PRAVACHOL) 40 MG tablet, Take 1 tablet (40 mg total) by mouth every evening., Disp: 90 tablet, Rfl: 3  EXAM:  Vitals:   07/16/18 1629  BP: 138/80  Pulse: 62  Temp: 98.2 F (36.8 C)    Body mass index is 28.74 kg/m.  GENERAL: vitals reviewed and listed above, alert, oriented, appears well hydrated and in no acute distress  HEENT: atraumatic, conjunttiva clear, no obvious abnormalities on inspection of external nose and ears  NECK: no obvious masses on inspection  MS: walks with walker, head forward posture, good use of hands/arms and legs, some  decreased ROM in neck, TTP R sub occipital muscles with muscle tension, T3-6 RrSl, step off spine in lumbar region from prior surgeries, lumbar paraspinal muscle tension, L ant inominate, TO Rr, TI Rl, OA Rr, C2-3 RrSl,  R int temp  PSYCH: pleasant and cooperative, no obvious depression or anxiety  ASSESSMENT AND PLAN:  Discussed the following assessment and plan:  Chronic neck pain  Somatic dysfunction of head region  Somatic dysfunction of cervical region  Somatic dysfunction of thoracic region  Somatic dysfunction of spine, lumbar  Somatic dysfunction of pelvic region  -discussed various treatment options and he would like  to continue with OMM (see below) and acupuncture along with home exercises -he seems quite please that he has had a 50% reduction in symptoms as had not gotten much relief from prior interventions  PROCEDURE NOTE : OSTEOPATHIC TREATMENT The decision today to treat with gentle Osteopathic Manipulative Therapy  (OMT) was based on physical exam findings, diagnoses and patient wishes. Verbal consent was obtained after after explanation of risks and benefits. No Cervical HVLA manipulation was performed. After consent was obtained, treatment was  performed as below:      Regions treated:  Head, neck, thoracic, lumbar, pelvic     Techniques used: MR, BLT, cranial, ME The patient tolerated the treatment well and reported Improved  symptoms following treatment today. Follow up treatment was advised in: as needed  -Patient advised to return or notify a doctor immediately if symptoms worsen or persist or new concerns arise.  There are no Patient Instructions on file for this visit.  [name], DO

## 2018-07-18 ENCOUNTER — Ambulatory Visit: Payer: Medicare Other | Admitting: Family Medicine

## 2018-07-26 ENCOUNTER — Other Ambulatory Visit: Payer: Self-pay | Admitting: Internal Medicine

## 2018-08-01 ENCOUNTER — Encounter: Payer: Self-pay | Admitting: Family Medicine

## 2018-08-01 ENCOUNTER — Ambulatory Visit: Payer: Medicare Other | Admitting: Family Medicine

## 2018-08-01 VITALS — BP 122/60 | HR 62 | Temp 98.3°F | Ht 70.0 in

## 2018-08-01 DIAGNOSIS — M25512 Pain in left shoulder: Secondary | ICD-10-CM | POA: Diagnosis not present

## 2018-08-01 DIAGNOSIS — M9908 Segmental and somatic dysfunction of rib cage: Secondary | ICD-10-CM

## 2018-08-01 DIAGNOSIS — R202 Paresthesia of skin: Secondary | ICD-10-CM

## 2018-08-01 DIAGNOSIS — M9901 Segmental and somatic dysfunction of cervical region: Secondary | ICD-10-CM

## 2018-08-01 DIAGNOSIS — M9907 Segmental and somatic dysfunction of upper extremity: Secondary | ICD-10-CM

## 2018-08-01 DIAGNOSIS — M9900 Segmental and somatic dysfunction of head region: Secondary | ICD-10-CM

## 2018-08-01 DIAGNOSIS — M9902 Segmental and somatic dysfunction of thoracic region: Secondary | ICD-10-CM | POA: Diagnosis not present

## 2018-08-01 DIAGNOSIS — M542 Cervicalgia: Secondary | ICD-10-CM | POA: Diagnosis not present

## 2018-08-01 DIAGNOSIS — M9905 Segmental and somatic dysfunction of pelvic region: Secondary | ICD-10-CM

## 2018-08-01 NOTE — Progress Notes (Signed)
HPI:  Using dictation device. Unfortunately this device frequently misinterprets words/phrases.  Follow up neck pain.  Reports his neck had actually been doing "great" much better than ever before recently.  However, about 4 to 5 days ago he did a lot of moving in the house for painting and he had some issues for a few days, reports this is now resolving and he has very little neck pain today.  He did get some pain in his left shoulder with all of the lifting and carrying.  Gets this from time to time.  Pain in the lateral left shoulder, particularly with abduction.  This is better today as well.  He continues to do acupuncture, and feels this is been helpful with his chronic pain.  He also has been wearing some gloves for his neuropathy (tingling) in his hands and feels this has helped.  Denies weakness, worsening, severe pain.  See below for further details regarding his neck issues.   Prior history initial visit: -chronic -sees Dr. Nelva Bush, [facility] - feelspmrtreatments have not helped -reports saw several neurosurgeons and was told surgery would not help -pain intermittent, triggered by turning head to the R, R sided, sharp - also with standing up with walker; recent L great toe fx (reports treated with boot x 6 weeks, recently out of boot), some R low back pain last week, now resolved -hx bilat knee replacements, hx low back surgery with Dr. Arnoldo Morale ([facility]) -denies:radiation, weakness, numbness - though occ tingly R 1-2nd fingers with increased walker use -feels OMM Has helped -prior treatments: tramadol, injections, low back surgery -brings MRI report from [facility] from 08/2017 - see scanned report, sig deg changes ROS: See pertinent positives and negatives per HPI.  Past Medical History:  Diagnosis Date  . Anemia   . Atherosclerotic PVD with intermittent claudication ([place])   . CAD (coronary artery disease)   . Diabetes mellitus    type 2 with peripheral neuropathy  . Diastolic CHF,  chronic ([place])   . Diverticulosis   . DVT (deep venous thrombosis) ([place])   . Dyslipidemia   . Edema 08/19/2014  . GERD (gastroesophageal reflux disease)    hx hiatal hernia  . HTN (hypertension)   . IBS (irritable bowel syndrome)    Hx: of  . Osteoarthritis   . Overactive bladder    Hx: of stress incontinence  . Pneumonia 2002   history of  . Shingles   . Varicose veins of lower extremities with other complications 0/86/7619    Past Surgical History:  Procedure Laterality Date  . BACK SURGERY    . CATARACT EXTRACTION Bilateral 08/10/2015,08/15/2015  . COLONOSCOPY     Hx: of  . CTS bilateral    . KNEE ARTHROSCOPY  1988, 1992   bilateral  . LAMINECTOMY  08/18/2013   DECOMPRESSIVE LAMINECTOMIES  . LEFT AND RIGHT HEART CATHETERIZATION WITH CORONARY ANGIOGRAM N/A 09/03/2014   Procedure: LEFT AND RIGHT HEART CATHETERIZATION WITH CORONARY ANGIOGRAM;  Surgeon: [name], MD;  Location: [facility] CATH LAB;  Service: Cardiovascular;  Laterality: N/A;  . LUMBAR LAMINECTOMY/DECOMPRESSION MICRODISCECTOMY N/A 08/18/2013   Procedure: Lumbar two-three, lumbar three-four, lumbar four-five decompressive laminectomies;  Surgeon: [name], MD;  Location: [facility];  Service: Neurosurgery;  Laterality: N/A;  . lumbar left arm  1961  . TONSILLECTOMY    . TOTAL KNEE ARTHROPLASTY  2005, 2007  . wrist tendon surgery      Family History  Problem Relation Age of Onset  .  Other Mother 73       CABG  . Heart disease Mother   . Hypertension Mother   . Hyperlipidemia Son     SOCIAL HX: See HPI   Current Outpatient Medications:  .  amLODipine (NORVASC) 5 MG tablet, TAKE ONE TABLET BY MOUTH DAILY, Disp: 90 tablet, Rfl: 3 .  aspirin 81 MG tablet, Take 81 mg by mouth daily. , Disp: , Rfl:  .  B-D UF III MINI PEN NEEDLES 31G X 5 MM MISC, USE TO TEST BLOOD SUGAR FOUR TIMES A DAY, Disp: 200 each, Rfl: 0 .  furosemide (LASIX) 40 MG tablet, Take 1 tablet (40 mg total) by mouth daily. ONLY FOR PRN  SWELLING, Disp: 90 tablet, Rfl: 3 .  glucose blood (ONETOUCH VERIO) test strip, USE TO TEST BLOOD SUGAR THREE TIMES A DAY AS DIRECTED, Disp: 250 each, Rfl: 4 .  HUMALOG KWIKPEN 100 UNIT/ML KiwkPen, INJECT 6 TO 10 UNITS INTO THE SKIN BEFORE EACH MEAL THREE TIMES A DAY, Disp: 15 pen, Rfl: 0 .  ibuprofen (ADVIL,MOTRIN) 200 MG tablet, Take 200 mg by mouth every 6 (six) hours as needed., Disp: , Rfl:  .  Insulin Detemir (LEVEMIR FLEXTOUCH) 100 UNIT/ML Pen, INJECT 18 UNITS INTO THE SKIN DAILY AT 10 PM, Disp: 15 pen, Rfl: 3 .  Lancet Device MISC, by Does not apply route. UAD for TID blood glucose monitoring, Disp: , Rfl:  .  LEVEMIR FLEXTOUCH 100 UNIT/ML Pen, INJECT 20 UNITS INTO THE SKIN DAILY AT 10PM, Disp: 15 pen, Rfl: 0 .  Magnesium 200 MG TABS, Take 1 tablet (200 mg total) by mouth daily., Disp: 30 each, Rfl: 11 .  meclizine (ANTIVERT) 25 MG tablet, Take 25 mg by mouth 2 (two) times daily., Disp: , Rfl:  .  metFORMIN (GLUCOPHAGE) 1000 MG tablet, TAKE ONE TABLET BY MOUTH TWICE A DAY WITH MEALS, Disp: 180 tablet, Rfl: 0 .  Multiple Vitamins-Minerals (CENTRUM SILVER PO), Take 1 tablet by mouth daily. , Disp: , Rfl:  .  Naproxen Sodium (ALEVE) 220 MG CAPS, Take by mouth as needed., Disp: , Rfl:  .  omeprazole (PRILOSEC) 20 MG capsule, TAKE ONE CAPSULE BY MOUTH DAILY, Disp: 90 capsule, Rfl: 1 .  ONETOUCH DELICA LANCETS 66A MISC, USE TO TEST BLOOD SUGAR THREE TIMES A DAY AS DIRECTED, Disp: 200 each, Rfl: 0 .  ONETOUCH VERIO test strip, USE TO TEST BLOOD SUGAR 3 TIMES A DAY AS DIRECTED, Disp: 200 each, Rfl: 3 .  OVER THE COUNTER MEDICATION, , Disp: , Rfl:  .  Propylene Glycol (SYSTANE BALANCE) 0.6 % SOLN, Apply to eye. 2 drops each eye once daily, Disp: , Rfl:  .  vitamin B-12 (CYANOCOBALAMIN) 1000 MCG tablet, Take 1,000 mcg by mouth daily., Disp: , Rfl:  .  pravastatin (PRAVACHOL) 40 MG tablet, Take 1 tablet (40 mg total) by mouth every evening., Disp: 90 tablet, Rfl: 3  EXAM:  Vitals:   08/01/18 1127   BP: 122/60  Pulse: 62  Temp: 98.3 F (36.8 C)    Body mass index is 28.74 kg/m.  GENERAL: vitals reviewed and listed above, alert, oriented, appears well hydrated and in no acute distress  HEENT: atraumatic, conjunttiva clear, no obvious abnormalities on inspection of external nose and ears  NECK: no obvious masses on inspection, some pain in his neck when he turns to far extension in the right rotation  MS: moves all extremities without noticeable abnormality, walks with his walker, gait is cautious and slow, he  has some tenderness to palpation mildly in the left rotator cuff attachment to the humerus, strength is fairly well-preserved in his upper extremities bilaterally throughout, mildly positive impingement testing on the left shoulder, mild restriction in internal rotation, inhalation ribs 2 through 5 on the left, OA rotated right, T3-5 rotated left side bent right, left anterior innominate, left internal temporal  PSYCH: pleasant and cooperative, no obvious depression or anxiety  ASSESSMENT AND PLAN:  Discussed the following assessment and plan:  Neck pain  Acute pain of left shoulder  Paresthesias  Somatic dysfunction of head region  Somatic dysfunction of cervical region  Somatic dysfunction of thoracic region  Somatic dysfunction of upper extremity  Somatic dysfunction of pelvis region  Somatic dysfunction of rib cage region  -Discussed various options for the treatment of his chronic neck pain and his new shoulder pain.  Suspect mild rotator cuff injury in addition to his long-term issues.  He reports his hand issues have resolved with the use of gloves.  His neck seems to be doing much better than it did in the past in terms of symptoms.  He has significant pathology in the neck and sees a specialist as well.  He prefers to try alternative noninvasive measures to help with the pain, and these seem to be working well.  He feels the acupuncture has helped some.   He wants to do OMM today.  Otherwise, it seems he is actually recovering quite well from the moving he did in his house recently.  Advised follow-up if his symptoms do not continue to resolve.  He actually has a follow-up for his regular health problems and labs coming up on Monday.  He agrees to come fasting then.  See below.  PROCEDURE NOTE : OSTEOPATHIC TREATMENT The decision today to treat with gentle Osteopathic Manipulative Therapy  (OMT) was based on physical exam findings, diagnoses and patient wishes. Verbal consent was obtained after after explanation of risks and benefits. No Cervical HVLA manipulation was performed. After consent was obtained, treatment was  performed as below:      Regions treated:  Head, cervical, UE, thoracic, rib, pelvic     Techniques used: Me, MR, BLT, cranial The patient tolerated the treatment well and reported Improved  symptoms following treatment today. Follow up treatment was advised in: as needed if symptoms persist or worsen  -Patient advised to return or notify a doctor immediately if symptoms worsen or persist or new concerns arise.  There are no Patient Instructions on file for this visit.  [name], DO

## 2018-08-04 NOTE — Progress Notes (Signed)
HPI:  Using dictation device. Unfortunately this device frequently misinterprets words/phrases.  [name] is a pleasant 84 y.o. here for follow up multiple chronic medical problems and labs. Chronic medical problems summarized below were reviewed for changes and stability and were updated as needed below. These issues and their treatment remain stable for the most part.  He does have some questions about his blood sugar.  He wonders when he would see his highest blood sugars and lowest blood sugars and what his goals are for fasting and postprandial blood sugars.  He is taking 18 units of his basal insulin and 6 to 8 units of mealtime insulin.  Denies any low blood sugars under 70.  He wondered if the 97 that he had this morning fasting was too low.  He sees Dr. Cruzita Lederer endocrinology for management of his diabetes. He continues his aspirin, Lasix, blood pressure medications and cholesterol medications and is tolerating these well.  Denies CP, SOB, DOE, treatment intolerance or new symptoms.  AWV 05/21/18 Diabetes - Last U2G2.5 4/27 -complications: peripheral neuropathy -managed by endocrinology, Dr. Cruzita Lederer  -meds: metformin,Levimir, insulin with meals, asa -eye exam: sees Dr. Idolina Primer -foot exam: utd  Essential hypertensionw/ diabetes - stable  -meds: norvasc, doxasosin, lasix- sees cardiology  CAD, Chronic diastolic congestive heart failure - 2-D echo on 08/26/14 showed EF 60-65% with grade 1 diastolic dysfunction -chronic LE edema  -cath 2015 30% LM, 40-50%LAD -nuclear medicine stress test 01/2018, neg for ischemia per cardiology notes  -meds: asa, statinstopped 06/2017 for leg complaints and elevated CK, then restarted - managed by his cardiologist, lasix  -cardiologist: Dr. [name]  OAB (overactive bladder) -meds: Cardura -on myrbetriq and vesicare in the past -stable  -[place] urologist, Dr. Patsy [place]  GERD: -meds: prilosec  Hyperlipemia - stable  -  meds: Pravastatinstopped by cardiology due to leg cramps and elevated CK - then restarted  Anemia: -during hospital stay 2016 and mild in the past on review cardiology records  -no bleeding   Chronic neck pain: -sees Dr. Nelva Bush, [facility], for management -also has responded well to OMM and accupuncture and is doing better currently -meds: tramadol  ROS: See pertinent positives and negatives per HPI.  Past Medical History:  Diagnosis Date  . Anemia   . Atherosclerotic PVD with intermittent claudication ([place])   . CAD (coronary artery disease)   . Diabetes mellitus    type 2 with peripheral neuropathy  . Diastolic CHF, chronic ([place])   . Diverticulosis   . DVT (deep venous thrombosis) ([place])   . Dyslipidemia   . Edema 08/19/2014  . GERD (gastroesophageal reflux disease)    hx hiatal hernia  . HTN (hypertension)   . IBS (irritable bowel syndrome)    Hx: of  . Osteoarthritis   . Overactive bladder    Hx: of stress incontinence  . Pneumonia 2002   history of  . Shingles   . Varicose veins of lower extremities with other complications 0/62/3762    Past Surgical History:  Procedure Laterality Date  . BACK SURGERY    . CATARACT EXTRACTION Bilateral 08/10/2015,08/15/2015  . COLONOSCOPY     Hx: of  . CTS bilateral    . KNEE ARTHROSCOPY  1988, 1992   bilateral  . LAMINECTOMY  08/18/2013   DECOMPRESSIVE LAMINECTOMIES  . LEFT AND RIGHT HEART CATHETERIZATION WITH CORONARY ANGIOGRAM N/A 09/03/2014   Procedure: LEFT AND RIGHT HEART CATHETERIZATION WITH CORONARY ANGIOGRAM;  Surgeon: [name], MD;  Location[facility] CATH LAB;  Service: Cardiovascular;  Laterality: N/A;  . LUMBAR LAMINECTOMY/DECOMPRESSION MICRODISCECTOMY N/A 08/18/2013   Procedure: Lumbar two-three, lumbar three-four, lumbar four-five decompressive laminectomies;  Surgeon: [name], MD;  Location: [place] [facility];  Service: Neurosurgery;  Laterality: N/A;  . lumbar left arm  1961  . TONSILLECTOMY    . TOTAL  KNEE ARTHROPLASTY  2005, 2007  . wrist tendon surgery      Family History  Problem Relation Age of Onset  . Other Mother 76       CABG  . Heart disease Mother   . Hypertension Mother   . Hyperlipidemia Son     SOCIAL HX: see hpi   Current Outpatient Medications:  .  amLODipine (NORVASC) 5 MG tablet, TAKE ONE TABLET BY MOUTH DAILY, Disp: 90 tablet, Rfl: 3 .  aspirin 81 MG tablet, Take 81 mg by mouth daily. , Disp: , Rfl:  .  B-D UF III MINI PEN NEEDLES 31G X 5 MM MISC, USE TO TEST BLOOD SUGAR FOUR TIMES A DAY, Disp: 200 each, Rfl: 0 .  furosemide (LASIX) 40 MG tablet, Take 1 tablet (40 mg total) by mouth daily. ONLY FOR PRN SWELLING, Disp: 90 tablet, Rfl: 3 .  glucose blood (ONETOUCH VERIO) test strip, USE TO TEST BLOOD SUGAR THREE TIMES A DAY AS DIRECTED, Disp: 250 each, Rfl: 4 .  HUMALOG KWIKPEN 100 UNIT/ML KiwkPen, INJECT 6 TO 10 UNITS INTO THE SKIN BEFORE EACH MEAL THREE TIMES A DAY, Disp: 15 pen, Rfl: 0 .  ibuprofen (ADVIL,MOTRIN) 200 MG tablet, Take 200 mg by mouth every 6 (six) hours as needed., Disp: , Rfl:  .  Insulin Detemir (LEVEMIR FLEXTOUCH) 100 UNIT/ML Pen, INJECT 18 UNITS INTO THE SKIN DAILY AT 10 PM, Disp: 15 pen, Rfl: 3 .  Lancet Device MISC, by Does not apply route. UAD for TID blood glucose monitoring, Disp: , Rfl:  .  LEVEMIR FLEXTOUCH 100 UNIT/ML Pen, INJECT 20 UNITS INTO THE SKIN DAILY AT 10PM, Disp: 15 pen, Rfl: 0 .  Magnesium 200 MG TABS, Take 1 tablet (200 mg total) by mouth daily., Disp: 30 each, Rfl: 11 .  meclizine (ANTIVERT) 25 MG tablet, Take 25 mg by mouth 2 (two) times daily., Disp: , Rfl:  .  metFORMIN (GLUCOPHAGE) 1000 MG tablet, TAKE ONE TABLET BY MOUTH TWICE A DAY WITH MEALS, Disp: 180 tablet, Rfl: 0 .  Multiple Vitamins-Minerals (CENTRUM SILVER PO), Take 1 tablet by mouth daily. , Disp: , Rfl:  .  Naproxen Sodium (ALEVE) 220 MG CAPS, Take by mouth as needed., Disp: , Rfl:  .  omeprazole (PRILOSEC) 20 MG capsule, TAKE ONE CAPSULE BY MOUTH DAILY,  Disp: 90 capsule, Rfl: 1 .  ONETOUCH DELICA LANCETS 84O MISC, USE TO TEST BLOOD SUGAR THREE TIMES A DAY AS DIRECTED, Disp: 200 each, Rfl: 0 .  ONETOUCH VERIO test strip, USE TO TEST BLOOD SUGAR 3 TIMES A DAY AS DIRECTED, Disp: 200 each, Rfl: 3 .  OVER THE COUNTER MEDICATION, , Disp: , Rfl:  .  Propylene Glycol (SYSTANE BALANCE) 0.6 % SOLN, Apply to eye. 2 drops each eye once daily, Disp: , Rfl:  .  vitamin B-12 (CYANOCOBALAMIN) 1000 MCG tablet, Take 1,000 mcg by mouth daily., Disp: , Rfl:  .  pravastatin (PRAVACHOL) 40 MG tablet, Take 1 tablet (40 mg total) by mouth every evening., Disp: 90 tablet, Rfl: 3  EXAM:  Vitals:   08/05/18 0824  BP: 112/70  Pulse: 74  Temp: 98 F (36.7 C)  Body mass index is 28.74 kg/m.  GENERAL: vitals reviewed and listed above, alert, oriented, appears well hydrated and in no acute distress  HEENT: atraumatic, conjunttiva clear, no obvious abnormalities on inspection of external nose and ears  NECK: no obvious masses on inspection  LUNGS: clear to auscultation bilaterally, no wheezes, rales or rhonchi, good air movement  CV: HRRR, no peripheral edema  MS: moves all extremities, walking with walker  PSYCH: pleasant and cooperative, no obvious depression or anxiety  ASSESSMENT AND PLAN:  Discussed the following assessment and plan:  Hypertension associated with diabetes ([place]) - Plan: Basic metabolic panel, CBC  Hyperlipidemia associated with type 2 diabetes mellitus ([place]) - Plan: Lipid panel  Type 2 diabetes mellitus with diabetic polyneuropathy, with long-term current use of insulin ([facility])  Diabetic peripheral neuropathy associated with type 2 diabetes mellitus ([place])  Coronary artery disease due to lipid rich plaque  Chronic diastolic congestive heart failure ([facility])  -seems to be doing well overall -Labs per orders -Discussed expected peaks and troughs in regards to blood sugar, and fasting and postprandial goals -Discussed diet in  terms of maintaining a stable glycemic index -Discussed low blood sugars and management -Lifestyle recommendations -Discussed dosing of B12, and advised to consider lowering the dose to 500 or 250 daily -Discussed timing of the flu shot and he opted to get this in a month or 2 and agrees to schedule a nurse visit -Follow-up in 3 to 4 months, sooner as needed   Patient Instructions  BEFORE YOU LEAVE: -labs -follow up:  1) Nurse visit last week of sept or first week of October for flu shot 2) follow up with Dr. Maudie Mercury in 3-4 months  We have ordered labs or studies at this visit. It can take up to 1-2 weeks for results and processing. IF results require follow up or explanation, we will call you with instructions. Clinically stable results will be released to your [facility]. If you have not heard from [place] or cannot find your results in [facility] in 2 weeks please contact our office at [phone].  If you are not yet signed up for [facility], please consider signing up.    We recommend the following healthy lifestyle for LIFE: 1) Small portions. But, make sure to get regular (at least 3 per day), healthy meals and small healthy snacks if needed.  2) Eat a healthy clean diet.   TRY TO EAT: -at least 5-7 servings of low sugar, colorful, and nutrient rich vegetables per day (not corn, potatoes or bananas.) -berries are the best choice if you wish to eat fruit (only eat small amounts if trying to reduce weight)  -lean meets (fish, white meat of chicken or [place]) -vegan proteins for some meals - beans or tofu, whole grains, nuts and seeds -Replace bad fats with good fats - good fats include: fish, nuts and seeds, canola oil, olive oil -small amounts of low fat or non fat dairy -small amounts of100 % whole grains - check the lables -drink plenty of water  AVOID: -SUGAR, sweets, anything with added sugar, corn syrup or sweeteners - must read labels as even foods advertised as "healthy" often are  loaded with sugar -if you must have a sweetener, small amounts of stevia may be best -sweetened beverages and artificially sweetened beverages -simple starches (rice, bread, potatoes, pasta, chips, etc - small amounts of 100% whole grains are ok) -red meat, pork, butter -fried foods, fast food, processed food, excessive dairy, eggs and coconut.  3)Get  at least 150 minutes of sweaty aerobic exercise per week.  4)Reduce stress - consider counseling, meditation and relaxation to balance other aspects of your life.         [name], DO

## 2018-08-05 ENCOUNTER — Encounter: Payer: Self-pay | Admitting: Family Medicine

## 2018-08-05 ENCOUNTER — Ambulatory Visit: Payer: Medicare Other | Admitting: Family Medicine

## 2018-08-05 VITALS — BP 112/70 | HR 74 | Temp 98.0°F | Ht 70.0 in | Wt 200.3 lb

## 2018-08-05 DIAGNOSIS — E1159 Type 2 diabetes mellitus with other circulatory complications: Secondary | ICD-10-CM

## 2018-08-05 DIAGNOSIS — I2583 Coronary atherosclerosis due to lipid rich plaque: Secondary | ICD-10-CM

## 2018-08-05 DIAGNOSIS — I5032 Chronic diastolic (congestive) heart failure: Secondary | ICD-10-CM

## 2018-08-05 DIAGNOSIS — I2510 Atherosclerotic heart disease of native coronary artery without angina pectoris: Secondary | ICD-10-CM

## 2018-08-05 DIAGNOSIS — E1142 Type 2 diabetes mellitus with diabetic polyneuropathy: Secondary | ICD-10-CM

## 2018-08-05 DIAGNOSIS — E1169 Type 2 diabetes mellitus with other specified complication: Secondary | ICD-10-CM | POA: Diagnosis not present

## 2018-08-05 DIAGNOSIS — I10 Essential (primary) hypertension: Secondary | ICD-10-CM | POA: Diagnosis not present

## 2018-08-05 DIAGNOSIS — I152 Hypertension secondary to endocrine disorders: Secondary | ICD-10-CM

## 2018-08-05 DIAGNOSIS — Z794 Long term (current) use of insulin: Secondary | ICD-10-CM

## 2018-08-05 DIAGNOSIS — E785 Hyperlipidemia, unspecified: Secondary | ICD-10-CM

## 2018-08-05 LAB — BASIC METABOLIC PANEL
BUN: 17 mg/dL (ref 6–23)
CHLORIDE: 102 meq/L (ref 96–112)
CO2: 29 mEq/L (ref 19–32)
Calcium: 9.4 mg/dL (ref 8.4–10.5)
Creatinine, Ser: 0.87 mg/dL (ref 0.40–1.50)
GFR: 89.11 mL/min (ref >60.00)
GLUCOSE: 152 mg/dL — AB (ref 70–99)
POTASSIUM: 4.5 meq/L (ref 3.5–5.1)
SODIUM: 138 meq/L (ref 135–145)

## 2018-08-05 LAB — CBC
HCT: 36 % — ABNORMAL LOW (ref 39.0–52.0)
Hemoglobin: 12 g/dL — ABNORMAL LOW (ref 13.0–17.0)
MCHC: 33.3 g/dL (ref 30.0–36.0)
MCV: 87.3 fl (ref 78.0–100.0)
Platelets: 188 10*3/uL (ref 150.0–400.0)
RBC: 4.13 Mil/uL — AB (ref 4.22–5.81)
RDW: 14.1 % (ref 11.5–15.5)
WBC: 8.4 10*3/uL (ref 4.0–10.5)

## 2018-08-05 LAB — LIPID PANEL
CHOLESTEROL: 119 mg/dL (ref 0–200)
HDL: 44.9 mg/dL (ref >39.00)
LDL CALC: 59 mg/dL (ref 0–99)
NonHDL: 74.32
TRIGLYCERIDES: 76 mg/dL (ref 0.0–149.0)
Total CHOL/HDL Ratio: 3
VLDL: 15.2 mg/dL (ref 0.0–40.0)

## 2018-08-05 NOTE — Patient Instructions (Signed)
BEFORE YOU LEAVE: -labs -follow up:  1) Nurse visit last week of sept or first week of October for flu shot 2) follow up with Dr. Maudie Mercury in 3-4 months  We have ordered labs or studies at this visit. It can take up to 1-2 weeks for results and processing. IF results require follow up or explanation, we will call you with instructions. Clinically stable results will be released to your [facility]. If you have not heard from [place] or cannot find your results in [facility] in 2 weeks please contact our office at [phone].  If you are not yet signed up for [facility], please consider signing up.    We recommend the following healthy lifestyle for LIFE: 1) Small portions. But, make sure to get regular (at least 3 per day), healthy meals and small healthy snacks if needed.  2) Eat a healthy clean diet.   TRY TO EAT: -at least 5-7 servings of low sugar, colorful, and nutrient rich vegetables per day (not corn, potatoes or bananas.) -berries are the best choice if you wish to eat fruit (only eat small amounts if trying to reduce weight)  -lean meets (fish, white meat of chicken or [place]) -vegan proteins for some meals - beans or tofu, whole grains, nuts and seeds -Replace bad fats with good fats - good fats include: fish, nuts and seeds, canola oil, olive oil -small amounts of low fat or non fat dairy -small amounts of100 % whole grains - check the lables -drink plenty of water  AVOID: -SUGAR, sweets, anything with added sugar, corn syrup or sweeteners - must read labels as even foods advertised as "healthy" often are loaded with sugar -if you must have a sweetener, small amounts of stevia may be best -sweetened beverages and artificially sweetened beverages -simple starches (rice, bread, potatoes, pasta, chips, etc - small amounts of 100% whole grains are ok) -red meat, pork, butter -fried foods, fast food, processed food, excessive dairy, eggs and coconut.  3)Get at least 150 minutes of sweaty  aerobic exercise per week.  4)Reduce stress - consider counseling, meditation and relaxation to balance other aspects of your life.

## 2018-08-19 ENCOUNTER — Other Ambulatory Visit: Payer: Self-pay | Admitting: Internal Medicine

## 2018-08-19 ENCOUNTER — Encounter: Payer: Self-pay | Admitting: Cardiology

## 2018-08-20 ENCOUNTER — Telehealth: Payer: Self-pay | Admitting: Emergency Medicine

## 2018-08-20 MED ORDER — GLUCOSE BLOOD VI STRP: ORAL_STRIP | 3 refills | Status: DC

## 2018-08-20 MED ORDER — ONETOUCH DELICA LANCETS 33G MISC: 3 refills | Status: DC

## 2018-08-20 NOTE — Telephone Encounter (Signed)
Yes, ok 

## 2018-08-20 NOTE — Telephone Encounter (Signed)
RX sent

## 2018-08-20 NOTE — Telephone Encounter (Signed)
Received fax from [employer]. Pt states he is testing blood sugars four times daily. Okay to send in new RX for 4 xs a day?

## 2018-08-21 ENCOUNTER — Other Ambulatory Visit: Payer: Self-pay | Admitting: Internal Medicine

## 2018-08-22 NOTE — Progress Notes (Signed)
HPI: FU CAD and diastolic CHF. ABIs 3/14 normal. Seen previously for chest pain, dyspnea and lower ext edema. Echocardiogram September 2015 showed normal LV function, grade 1 diastolic dysfunction and mild mitral regurgitation. Moderate left atrial enlargement. Cardiac catheterization September 2015 showed a 30% left main, 40-50% LAD and no obstructive disease in the circumflex or right coronary artery. PCWP 4. PA pressure 19/4.  Monitor January 2019 showed sinus to sinus bradycardia with PVCs and 3 beats nonsustained ventricular tachycardia.  Nuclear study February 2019 showed ejection fraction 55% with no ischemia or infarction.  Blood pressure medications were reduced previously because of dizziness. Since last seen,  patient denies exertional chest pain, dyspnea, palpitations or syncope.  His previous dizziness is much improved after reducing blood pressure medications.  Current Outpatient Medications  Medication Sig Dispense Refill  . amLODipine (NORVASC) 5 MG tablet TAKE ONE TABLET BY MOUTH DAILY 90 tablet 3  . aspirin 81 MG tablet Take 81 mg by mouth daily.     . B-D UF III MINI PEN NEEDLES 31G X 5 MM MISC USE TO TEST BLOOD SUGAR FOUR TIMES A DAY 200 each 0  . furosemide (LASIX) 40 MG tablet Take 1 tablet (40 mg total) by mouth daily. ONLY FOR PRN SWELLING 90 tablet 3  . glucose blood (ONETOUCH VERIO) test strip USE TO TEST BLOOD SUGAR THREE TIMES A DAY AS DIRECTED 250 each 4  . glucose blood (ONETOUCH VERIO) test strip USE TO TEST BLOOD SUGAR 4 TIMES A DAY AS DIRECTED 400 each 3  . HUMALOG KWIKPEN 100 UNIT/ML KiwkPen INJECT 6 TO 10 UNITS INTO THE SKIN BEFORE EACH MEAL THREE TIMES A DAY 15 pen 0  . ibuprofen (ADVIL,MOTRIN) 200 MG tablet Take 200 mg by mouth every 6 (six) hours as needed.    . Insulin Detemir (LEVEMIR FLEXTOUCH) 100 UNIT/ML Pen INJECT 18 UNITS INTO THE SKIN DAILY AT 10 PM 15 pen 3  . Lancet Device MISC by Does not apply route. UAD for TID blood glucose monitoring    .  LEVEMIR FLEXTOUCH 100 UNIT/ML Pen INJECT 20 UNITS INTO THE SKIN DAILY AT 10PM 15 pen 0  . Magnesium 200 MG TABS Take 1 tablet (200 mg total) by mouth daily. 30 each 11  . meclizine (ANTIVERT) 25 MG tablet Take 25 mg by mouth 2 (two) times daily.    . metFORMIN (GLUCOPHAGE) 1000 MG tablet TAKE ONE TABLET BY MOUTH TWICE A DAY WITH MEALS 180 tablet 0  . Multiple Vitamins-Minerals (CENTRUM SILVER PO) Take 1 tablet by mouth daily.     . Naproxen Sodium (ALEVE) 220 MG CAPS Take by mouth as needed.    Marland Kitchen omeprazole (PRILOSEC) 20 MG capsule TAKE ONE CAPSULE BY MOUTH DAILY 90 capsule 1  . ONETOUCH DELICA LANCETS 68E MISC USE TO TEST BLOOD SUGAR FOUR TIMES A DAY AS DIRECTED 400 each 3  . OVER THE COUNTER MEDICATION     . Propylene Glycol (SYSTANE BALANCE) 0.6 % SOLN Apply to eye. 2 drops each eye once daily    . vitamin B-12 (CYANOCOBALAMIN) 1000 MCG tablet Take 1,000 mcg by mouth daily.    . pravastatin (PRAVACHOL) 40 MG tablet Take 1 tablet (40 mg total) by mouth every evening. 90 tablet 3   No current facility-administered medications for this visit.      Past Medical History:  Diagnosis Date  . Anemia   . Atherosclerotic PVD with intermittent claudication ([place])   . CAD (coronary artery disease)   .  Diabetes mellitus    type 2 with peripheral neuropathy  . Diastolic CHF, chronic ([place])   . Diverticulosis   . DVT (deep venous thrombosis) ([place])   . Dyslipidemia   . Edema 08/19/2014  . GERD (gastroesophageal reflux disease)    hx hiatal hernia  . HTN (hypertension)   . IBS (irritable bowel syndrome)    Hx: of  . Osteoarthritis   . Overactive bladder    Hx: of stress incontinence  . Pneumonia 2002   history of  . Shingles   . Varicose veins of lower extremities with other complications 5/85/2778    Past Surgical History:  Procedure Laterality Date  . BACK SURGERY    . CATARACT EXTRACTION Bilateral 08/10/2015,08/15/2015  . COLONOSCOPY     Hx: of  . CTS bilateral    . KNEE ARTHROSCOPY   1988, 1992   bilateral  . LAMINECTOMY  08/18/2013   DECOMPRESSIVE LAMINECTOMIES  . LEFT AND RIGHT HEART CATHETERIZATION WITH CORONARY ANGIOGRAM N/A 09/03/2014   Procedure: LEFT AND RIGHT HEART CATHETERIZATION WITH CORONARY ANGIOGRAM;  Surgeon: [name], MD;  Location: [facility] CATH LAB;  Service: Cardiovascular;  Laterality: N/A;  . LUMBAR LAMINECTOMY/DECOMPRESSION MICRODISCECTOMY N/A 08/18/2013   Procedure: Lumbar two-three, lumbar three-four, lumbar four-five decompressive laminectomies;  Surgeon: [name], MD;  Location: [facility] NEURO ORS;  Service: Neurosurgery;  Laterality: N/A;  . lumbar left arm  1961  . TONSILLECTOMY    . TOTAL KNEE ARTHROPLASTY  2005, 2007  . wrist tendon surgery      Social History   Socioeconomic History  . Marital status: Married    Spouse name: Not on file  . Number of children: 2  . Years of education: Not on file  . Highest education level: Not on file  Occupational History  . Not on file  Social Needs  . Financial resource strain: Not on file  . Food insecurity:    Worry: Not on file    Inability: Not on file  . Transportation needs:    Medical: Not on file    Non-medical: Not on file  Tobacco Use  . Smoking status: Never Smoker  . Smokeless tobacco: Never Used  Substance and Sexual Activity  . Alcohol use: No    Alcohol/week: 0.0 standard drinks  . Drug use: No  . Sexual activity: Not on file  Lifestyle  . Physical activity:    Days per week: Not on file    Minutes per session: Not on file  . Stress: Not on file  Relationships  . Social connections:    Talks on phone: Not on file    Gets together: Not on file    Attends religious service: Not on file    Active member of club or organization: Not on file    Attends meetings of clubs or organizations: Not on file    Relationship status: Not on file  . Intimate partner violence:    Fear of current or ex partner: Not on file    Emotionally abused: Not on file    Physically  abused: Not on file    Forced sexual activity: Not on file  Other Topics Concern  . 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 Other Mother 6       CABG  . Heart disease Mother   . Hypertension Mother   . Hyperlipidemia Son     ROS: Neck pain but no fevers or chills, productive cough, hemoptysis, dysphasia, odynophagia, melena, hematochezia, dysuria, hematuria, rash, seizure activity, orthopnea, PND, pedal edema, claudication. Remaining systems are negative.  Physical Exam: Well-developed well-nourished in no acute distress.  Skin is warm and dry.  HEENT is normal.  Neck is supple.  Chest is clear to auscultation with normal expansion.  Cardiovascular exam is regular rate and rhythm.  1/6 systolic murmur apex. Abdominal exam nontender or distended. No masses palpated. Extremities show no edema. neuro grossly intact  ECG-sinus rhythm with occasional PAC, left anterior fascicular block.  Personally reviewed  A/P  1 coronary artery disease-patient doing well with no chest pain.  Last nuclear study showed no ischemia.  Continue medical therapy with aspirin and statin.  2 hypertension-patient's blood pressure is controlled this morning.  Continue present medications and follow.  3 hyperlipidemia-continue statin.  Lipids checked August 2019 and total cholesterol 119 with LDL 59.  Check liver functions.  4 chronic diastolic congestive heart failure-patient is euvolemic today.  Continue present dose of diuretic.    Kirk Ruths, MD

## 2018-08-29 ENCOUNTER — Encounter: Payer: Self-pay | Admitting: Internal Medicine

## 2018-08-29 ENCOUNTER — Ambulatory Visit: Payer: Medicare Other | Admitting: Internal Medicine

## 2018-08-29 VITALS — BP 124/70 | HR 70 | Ht 70.0 in | Wt 199.0 lb

## 2018-08-29 DIAGNOSIS — E1169 Type 2 diabetes mellitus with other specified complication: Secondary | ICD-10-CM | POA: Diagnosis not present

## 2018-08-29 DIAGNOSIS — E1142 Type 2 diabetes mellitus with diabetic polyneuropathy: Secondary | ICD-10-CM

## 2018-08-29 DIAGNOSIS — E785 Hyperlipidemia, unspecified: Secondary | ICD-10-CM | POA: Diagnosis not present

## 2018-08-29 DIAGNOSIS — Z794 Long term (current) use of insulin: Secondary | ICD-10-CM | POA: Diagnosis not present

## 2018-08-29 LAB — POCT GLYCOSYLATED HEMOGLOBIN (HGB A1C): Hemoglobin A1C: 6.9 % — AB (ref 4.0–5.6)

## 2018-08-29 NOTE — Progress Notes (Signed)
Subjective:     Patient ID: [name], male   DOB: 1933/06/22, 84 y.o.   MRN: [id number]  HPI [name] is a very pleasant 84 y.o. man returning for management of DM2, dx 07/2012, uncontrolled, insulin-dependent, with complications (CAD, peripheral neuropathy, PAD). Last visit 4 months ago.  He continues to use a walker after his fall in 09/2015 as he continues to have weakness in his legs and poor equilibrium.  He completed physical therapy but does not feel that it helped.  He is seeing Dr. Nelva Bush.  He had a cervical MRI that showed osteoarthritis and he also got steroid injections in the neck.  Sugars were higher after injections. He tried acupuncture >> pain definitely bettre  Before last visit, he broke his left big toe.  This is now healed.  Last HbA1C was: Lab Results  Component Value Date   HGBA1C 7.7 04/26/2018   HGBA1C 7.1 12/27/2017   HGBA1C 6.9 08/23/2017   He is on: - Metformin 1000 mg 2x a day - Lantus 18 units at night - Humalog 15 min before a meal:  4 units before a small meal (use 4 units before breakfast 6 units before a regular meal 8 units before a large meal Please subtract 2 units if you plan to be active after a meal.  He brings a very good log in which he checks his sugars twice a day: - am:   75, 88, 90-128, 138 >> 78-126 >> 71-120, 125 - 2h after b'fast:130-158 >> 82-126 >> 65, 88-145 - before lunch: 117-148, 168 >> 106-139 >> 76-136, 150, 195 (correction of a 65) - 2h after lunch: 135-160 >> 150 >> 97-144 - dinner: 120-160 (snack) >> 122-165 (snack) >> 113, 131-170 (snack) - 2h after dinner:  140-180 >> 109-175 (ate out) >> 89-156, 190 - bedtime: 124-170 >> 114-170 >> 117-154, 160 - nighttime: 104-162 >> 80s, 81-127, 155 >> 73-150 Has hypoglycemia awareness in the 70s.  -No CKD. Last BUN/Cr: Lab Results  Component Value Date   BUN 17 08/05/2018   CREATININE 0.87 08/05/2018  He is not on an ACE inhibitor or ARB. -+ HL; last lipid  panel: Lab Results  Component Value Date   CHOL 119 08/05/2018   HDL 44.90 08/05/2018   LDLCALC 59 08/05/2018   TRIG 76.0 08/05/2018   CHOLHDL 3 08/05/2018  On pravastatin. -He has numbness and tingling in his feet.  He continues on B complex and compounded neuropathy cream. Sees Dr. Barkley Bruns at [facility]. - Last eye exam 10/2017: No DR (Dr. Alois Cliche).  He had cataract surgery in 08/2015.  He has macular degeneration  >> on Preservision Areds vitamins. On ASA 81.  He had a heart catheterization on 09/03/2014 >> 2 small Blockages >> no intervention other than statins. 2D ECHO with normal EF. In 08/2015 he was hospitalized for ?sepsis with strep pneumoniae after acquiring left lower lobe pneumonia.  Review of Systems Constitutional: no weight gain/no weight loss, no fatigue, no subjective hyperthermia, no subjective hypothermia Eyes: no blurry vision, no xerophthalmia ENT: no sore throat, no nodules palpated in throat, no dysphagia, no odynophagia, no hoarseness Cardiovascular: no CP/no SOB/no palpitations/no leg swelling Respiratory: no cough/no SOB/no wheezing Gastrointestinal: no N/no V/no D/no C/no acid reflux Musculoskeletal: + muscle aches/+ joint aches Skin: no rashes, no hair loss Neurological: no tremors/no numbness/no tingling/no dizziness  I reviewed pt's medications, allergies, PMH, social hx, family hx, and changes were documented in the history of present illness.  Otherwise, unchanged from my initial visit note. He reduced the dose of B12 daily.   Objective:   Physical Exam BP 124/70   Pulse 70   Ht 5\' 10"  (1.778 m)   Wt 199 lb (90.3 kg)   SpO2 97%   BMI 28.55 kg/m  Body mass index is 28.55 kg/m.  Wt Readings from Last 3 Encounters:  08/29/18 199 lb (90.3 kg)  08/05/18 200 lb 4.8 oz (90.9 kg)  07/04/18 200 lb 4.8 oz (90.9 kg)   Constitutional: overweight, in NAD, walks with a walker Eyes: PERRLA, EOMI, no exophthalmos ENT: moist mucous membranes,  no thyromegaly, no cervical lymphadenopathy Cardiovascular: RRR, No RG, +1/6 SEM Respiratory: CTA B Gastrointestinal: abdomen soft, NT, ND, BS+ Musculoskeletal: no deformities, strength intact in all 4 Skin: moist, warm, no rashes Neurological: no tremor with outstretched hands, DTR not checked  Assessment:     1. DM2, insulin-dependent, uncontrolled, with complications; target H8I for him: 7-7.5% - CAD - peripheral neuropathy - PAD Dr. [name]  2. PN - 2/2 DM  3. HL  Plan:     1. Pt with long-standing, uncontrolled, type 2 diabetes, on basal-bolus insulin regimen, and metformin, with improvement control in the last 1.5 years.  At last visit, as he was having a decreased appetite and having some low blood sugars at night, slightly under 80, we reduced his Lantus to 18 units.  We continued the recommendation to use 2 units less of Humalog if he plans to be active after meal.  And HbA1c at last visit was 7.7%, which was higher than expected from his sugar log and we decided to recheck it at this visit and if still not concordant, to check a fructosamine. -His sugars are improved from last visit, with occasional lower blood sugars in the 70s, but overall good control.  We do not absolutely need to change the regimen for now, we did discuss about subtracting 2 units of Humalog for a meal, if he plans to be active after the meal.  We also discussed that if his sugars stay in the 70s more, he may need to decrease the Levemir to 16 units. - I advised him to: Patient Instructions  Please continue: - Metformin 1000 mg 2x a day - Levemir 18 units at night (may need to decrease the dose to 16 units of you have more sugars in the 70s) - Humalog 15 min before a meal:  4 units before a small meal (use 4 units before breakfast) 6 units before a regular meal 8 units before a large meal Please subtract 2 units if you plan to be active after a meal.  Please come back for a follow-up appointment in  4 months.  - today, HbA1c is 6.9% (much better!) - continue checking sugars at different times of the day - check 3x a day, rotating checks - advised for yearly eye exams >> he is UTD - Refuses flu shot today, he will get it in 2 weeks. - Return to clinic in 4 mo with sugar log     2. PN -Due to diabetes -Sees Dr. Barkley Bruns with podiatry -Uses B complex daily and neuropathy cream as needed -No new symptoms.  3. HL - Reviewed latest lipid panel from 07/2018: All fractions at goal Lab Results  Component Value Date   CHOL 119 08/05/2018   HDL 44.90 08/05/2018   LDLCALC 59 08/05/2018   TRIG 76.0 08/05/2018   CHOLHDL 3 08/05/2018  - Continues pravastatin without side  effects.  Philemon Kingdom, MD PhD [facility] Endocrinology

## 2018-08-29 NOTE — Patient Instructions (Addendum)
Please continue: - Metformin 1000 mg 2x a day - Levemir 18 units at night (may need to decrease the dose to 16 units of you have more sugars in the 70s) - Humalog 15 min before a meal:  4 units before a small meal (use 4 units before breakfast) 6 units before a regular meal 8 units before a large meal Please subtract 2 units if you plan to be active after a meal.  Please come back for a follow-up appointment in 4 months.

## 2018-09-02 ENCOUNTER — Ambulatory Visit: Payer: Medicare Other | Admitting: Cardiology

## 2018-09-02 ENCOUNTER — Other Ambulatory Visit: Payer: Self-pay | Admitting: Internal Medicine

## 2018-09-02 ENCOUNTER — Encounter: Payer: Self-pay | Admitting: Cardiology

## 2018-09-02 VITALS — BP 120/60 | HR 80 | Ht 70.0 in | Wt 197.8 lb

## 2018-09-02 DIAGNOSIS — I2510 Atherosclerotic heart disease of native coronary artery without angina pectoris: Secondary | ICD-10-CM

## 2018-09-02 DIAGNOSIS — E7800 Pure hypercholesterolemia, unspecified: Secondary | ICD-10-CM

## 2018-09-02 DIAGNOSIS — I10 Essential (primary) hypertension: Secondary | ICD-10-CM

## 2018-09-02 DIAGNOSIS — Z79899 Other long term (current) drug therapy: Secondary | ICD-10-CM

## 2018-09-02 DIAGNOSIS — I2583 Coronary atherosclerosis due to lipid rich plaque: Secondary | ICD-10-CM | POA: Diagnosis not present

## 2018-09-02 LAB — HEPATIC FUNCTION PANEL
ALBUMIN: 4.4 g/dL (ref 3.5–4.7)
ALK PHOS: 54 IU/L (ref 39–117)
ALT: 16 IU/L (ref 0–44)
AST: 25 IU/L (ref 0–40)
Bilirubin Total: 0.5 mg/dL (ref 0.0–1.2)
Bilirubin, Direct: 0.16 mg/dL (ref 0.00–0.40)
TOTAL PROTEIN: 7 g/dL (ref 6.0–8.5)

## 2018-09-02 NOTE — Patient Instructions (Signed)
Medication Instructions:   NO CHANGE  Labwork:  Your physician recommends that you HAVE LAB WORK TODAY  Follow-Up:  Your physician wants you to follow-up in: ONE YEAR WITH DR CRENSHAW You will receive a reminder letter in the mail two months in advance. If you don't receive a letter, please call our office to schedule the follow-up appointment.   If you need a refill on your cardiac medications before your next appointment, please call your pharmacy.    

## 2018-09-03 ENCOUNTER — Encounter: Payer: Self-pay | Admitting: *Deleted

## 2018-09-04 ENCOUNTER — Other Ambulatory Visit: Payer: Self-pay | Admitting: Cardiology

## 2018-09-04 ENCOUNTER — Other Ambulatory Visit: Payer: Self-pay | Admitting: Family Medicine

## 2018-09-07 ENCOUNTER — Other Ambulatory Visit: Payer: Self-pay | Admitting: Internal Medicine

## 2018-09-10 ENCOUNTER — Ambulatory Visit (INDEPENDENT_AMBULATORY_CARE_PROVIDER_SITE_OTHER): Payer: Medicare Other

## 2018-09-10 DIAGNOSIS — Z23 Encounter for immunization: Secondary | ICD-10-CM | POA: Diagnosis not present

## 2018-09-10 NOTE — Progress Notes (Unsigned)
Per orders High Dose Fluzone given of injection of by Wyvonne Lenz. Patient tolerated injection well.

## 2018-09-11 ENCOUNTER — Other Ambulatory Visit: Payer: Self-pay | Admitting: Internal Medicine

## 2018-09-23 ENCOUNTER — Other Ambulatory Visit: Payer: Self-pay | Admitting: Internal Medicine

## 2018-11-01 ENCOUNTER — Other Ambulatory Visit: Payer: Self-pay | Admitting: Internal Medicine

## 2018-11-03 ENCOUNTER — Other Ambulatory Visit: Payer: Self-pay | Admitting: Internal Medicine

## 2018-11-11 LAB — HM DIABETES EYE EXAM

## 2018-11-27 ENCOUNTER — Other Ambulatory Visit: Payer: Self-pay | Admitting: Internal Medicine

## 2018-12-03 ENCOUNTER — Other Ambulatory Visit: Payer: Self-pay | Admitting: Internal Medicine

## 2018-12-05 ENCOUNTER — Ambulatory Visit: Payer: Medicare Other | Admitting: Family Medicine

## 2018-12-05 ENCOUNTER — Ambulatory Visit (INDEPENDENT_AMBULATORY_CARE_PROVIDER_SITE_OTHER): Payer: Medicare Other

## 2018-12-05 ENCOUNTER — Encounter: Payer: Self-pay | Admitting: Family Medicine

## 2018-12-05 ENCOUNTER — Ambulatory Visit: Payer: Self-pay | Admitting: *Deleted

## 2018-12-05 VITALS — BP 118/62 | HR 63 | Temp 97.7°F | Wt 192.3 lb

## 2018-12-05 DIAGNOSIS — S63502A Unspecified sprain of left wrist, initial encounter: Secondary | ICD-10-CM

## 2018-12-05 NOTE — Telephone Encounter (Signed)
Pt states that he feel yesterday, and now his left wrist is swollen; he applied ice but the wrist is still sore and red; he also says that he can wiggle his fingers; he has taken aleve for pain; recommendations made per nurse triage protocol; the pt normally sees Dr Maudie Mercury but she has no availability; spoke with Rachel Bo at [facility]; per [place], pt offered and accepted with Dr Sarajane Jews, [facility], 12/04/18 at 1030; he verbalized understanding; will route to office for notification of this upcoming appointment.      Reason for Disposition . [1] High-risk adult (e.g., age > 74, osteoporosis, chronic steroid use) AND [2] still hurts  Answer Assessment - Initial Assessment Questions 1. ONSET: "When did the swelling start?" (e.g., minutes, hours, days, weeks)     12/04/18 around 0700 2. LOCATION: "What part of the wrist is swollen?"  "Are both wrists swollen or just one wrist?"     Left wrist on top > bottom 3. SEVERITY: "How bad is the swelling?"    - BALL OR LUMP: small ball or lump   - SKIN ONLY: localized; puffy or swollen area or patch of skin   - MILD JOINT SWELLING: joint feels or looks mildly swollen or puffy   - MODERATE JOINT SWELLING: moderate joint swelling; looks swollen   - SEVERE JOINT SWELLING:  severe joint swelling; can barely bend or move joint     Mild to moderate joint swelling 4. RECURRENT SYMPTOM: "Have you had wrist swelling before?" If so, ask: "When was the last time?" "What happened that time?"     no 5. CAUSE: "What do you think is causing the wrist swelling?" (e.g., arthritis, ganglion cyst, insect bite, recent injury)     Pt fell 12/04/18 6. OTHER SYMPTOMS: "Do you have any other symptoms?" (e.g., fever, hand pain)     Pain in hand rated 2 out of 10; worsens with movement 7. PREGNANCY: "Is there any chance you are pregnant?" "When was your last menstrual period?"     n/a  Answer Assessment - Initial Assessment Questions 1. MECHANISM: "How did the injury  happen?"     Fell 12/04/18  2. ONSET: "When did the injury happen?" (Minutes or hours ago)      hours 3. APPEARANCE of INJURY: "What does the injury look like?"      Area swollen and puffy 4. SEVERITY: "Can you use the hand normally?" "Can you bend your fingers into a ball and then fully open them?"     Can wiggle fingers but can not lift anything heavy 5. SIZE: For cuts, bruises, or swelling, ask: "How large is it?" (e.g., inches or centimeters;  entire hand or wrist)      Swelling entir wrist 6. PAIN: "Is there pain?" If so, ask: "How bad is the pain?"  (Scale 1-10; or mild, moderate, severe)     Rated 2 out of 10 7. TETANUS: For any breaks in the skin, ask: "When was the last tetanus booster?"     n/a 8. OTHER SYMPTOMS: "Do you have any other symptoms?"      no 9. PREGNANCY: "Is there any chance you are pregnant?" "When was your last menstrual period?"     n/a  Protocols used: HAND AND WRIST INJURY-A-AH, WRIST [facility]

## 2018-12-05 NOTE — Progress Notes (Signed)
   Subjective:    Patient ID: HJALMER IOVINO, male    DOB: 06-26-1933, 84 y.o.   MRN: [id number]  HPI Here for an injury which occurred at home on 12-03-18. He was turning around to sit down on a stool when his foot got caught in a rug and he fell. He fell backwards against a wall and then slid to the floor, landing on his bottom. He reached out to catch himself and fell on the left wrist. He has had pain and swelling in the wrist since then. He has iced it several times, and he is taking Aleve and ES Tylenol for the pain.    Review of Systems  Constitutional: Negative.   Respiratory: Negative.   Cardiovascular: Negative.   Musculoskeletal: Positive for arthralgias and joint swelling.  Neurological: Negative.        Objective:   Physical Exam Constitutional:      General: He is not in acute distress.    Appearance: Normal appearance.  Cardiovascular:     Rate and Rhythm: Normal rate and regular rhythm.     Pulses: Normal pulses.     Heart sounds: Normal heart sounds.  Pulmonary:     Effort: Pulmonary effort is normal.     Breath sounds: Normal breath sounds.  Musculoskeletal:     Comments: The left wrist is swollen and tender, especially over the distal radius. Full ROM, no crepitus.  On my initial reading of the Xrays, no fractures are seen.  Neurological:     General: No focal deficit present.     Mental Status: He is alert and oriented to person, place, and time.           Assessment & Plan:  Wrist sprain. He is fitted with a spica splint for support. He will use Aleve and ES Tylenol as needed. Keep the wrist elevated. Await the Radiology report.  Alysia Penna, MD

## 2018-12-09 ENCOUNTER — Other Ambulatory Visit: Payer: Self-pay | Admitting: Adult Health

## 2018-12-11 NOTE — Progress Notes (Signed)
HPI:  Using dictation device. Unfortunately this device frequently misinterprets words/phrases.  [name] is a pleasant 85 y.o. here for follow up. Chronic medical problems summarized below were reviewed for changes and stability and were updated as needed below. These issues and their treatment remain stable for the most part.  Reports doing ok. Had a mechanical fall a few weeks ago when going to sit on a stool and missed - thought had fractured L wrist but xrays good and now no pain. He continues to have neck pain and has been worse the last few months - he plans to see Dr. Arnoldo Morale for follow up about this. Was told was not a surgical candidate last check, but to follow up if worsening or not improving with conservative care and he wants him to recheck. He tried injs with PMR, acupuncture and OMM. All helped, but then symptoms returned. Has sensation R helix a times and crusting lesion. Denies CP, SOB, DOE, treatment intolerance or new symptoms.  AWV 05/21/18 Diabetes - Last [id number] 5/40 /08 -complications: peripheral neuropathy -managed by endocrinology, Dr. Cruzita Lederer  -meds: metformin,Levimir, insulin with meals, asa -eye exam: sees Dr. Idolina Primer -foot exam: utd  Essential hypertensionw/ diabetes - stable  -meds: norvasc, doxasosin, lasix- sees cardiology  CAD, Chronic diastolic congestive heart failure - 2-D echo on 08/26/14 showed EF 60-65% with grade 1 diastolic dysfunction -chronic LE edema -cath 2015 30% LM, 40-50%LAD -nuclear medicine stress test 01/2018, neg for ischemia per cardiology notes -meds: asa, statinstopped 06/2017 for leg complaints and elevated CK, then restarted- managed by his cardiologist, lasix  -cardiologist: Dr. [name]  OAB (overactive bladder) -meds: Cardura -on myrbetriq and vesicare in the past -stable  -[place] urologist, Dr. Patsy [place]  GERD: -meds: prilosec  Hyperlipemia - stable  - meds: Pravastatinstopped by cardiology  due to leg cramps and elevated CK- then restarted  Anemia: -during hospital stay 2016 and mild in the past on review cardiology records  -no bleeding  Chronic neck pain: -sees Dr. Nelva Bush, [facility], for management -also has responded well to OMM and accupuncture and is doing better currently -meds: tramadol   ROS: See pertinent positives and negatives per HPI.  Past Medical History:  Diagnosis Date  . Anemia   . Atherosclerotic PVD with intermittent claudication ([place])   . CAD (coronary artery disease)   . Diabetes mellitus    type 2 with peripheral neuropathy  . Diastolic CHF, chronic ([place])   . Diverticulosis   . DVT (deep venous thrombosis) ([place])   . Dyslipidemia   . Edema 08/19/2014  . GERD (gastroesophageal reflux disease)    hx hiatal hernia  . HTN (hypertension)   . IBS (irritable bowel syndrome)    Hx: of  . Osteoarthritis   . Overactive bladder    Hx: of stress incontinence  . Pneumonia 2002   history of  . Shingles   . Varicose veins of lower extremities with other complications 6/76/1950    Past Surgical History:  Procedure Laterality Date  . BACK SURGERY    . CATARACT EXTRACTION Bilateral 08/10/2015,08/15/2015  . COLONOSCOPY     Hx: of  . CTS bilateral    . KNEE ARTHROSCOPY  1988, 1992   bilateral  . LAMINECTOMY  08/18/2013   DECOMPRESSIVE LAMINECTOMIES  . LEFT AND RIGHT HEART CATHETERIZATION WITH CORONARY ANGIOGRAM N/A 09/03/2014   Procedure: LEFT AND RIGHT HEART CATHETERIZATION WITH CORONARY ANGIOGRAM;  Surgeon: [name], MD;  Location: [facility] CATH LAB;  Service: Cardiovascular;  Laterality: N/A;  . LUMBAR LAMINECTOMY/DECOMPRESSION MICRODISCECTOMY N/A 08/18/2013   Procedure: Lumbar two-three, lumbar three-four, lumbar four-five decompressive laminectomies;  Surgeon: [name], MD;  Location: [place] NEURO ORS;  Service: Neurosurgery;  Laterality: N/A;  . lumbar left arm  1961  . TONSILLECTOMY    . TOTAL KNEE ARTHROPLASTY  2005, 2007  . wrist  tendon surgery      Family History  Problem Relation Age of Onset  . Other Mother 16       CABG  . Heart disease Mother   . Hypertension Mother   . Hyperlipidemia Son     SOCIAL HX: se ehpi   Current Outpatient Medications:  .  amLODipine (NORVASC) 5 MG tablet, TAKE ONE TABLET BY MOUTH DAILY, Disp: 90 tablet, Rfl: 3 .  aspirin 81 MG tablet, Take 81 mg by mouth daily. , Disp: , Rfl:  .  B-D UF III MINI PEN NEEDLES 31G X 5 MM MISC, USE TO TEST BLOOD SUGAR FOUR TIMES A DAY, Disp: 200 each, Rfl: 0 .  furosemide (LASIX) 40 MG tablet, TAKE ONE TABLET BY MOUTH DAILY, Disp: 90 tablet, Rfl: 2 .  glucose blood (ONETOUCH VERIO) test strip, USE TO TEST BLOOD SUGAR THREE TIMES A DAY AS DIRECTED, Disp: 250 each, Rfl: 4 .  glucose blood (ONETOUCH VERIO) test strip, USE TO TEST BLOOD SUGAR 4 TIMES A DAY AS DIRECTED, Disp: 400 each, Rfl: 3 .  HUMALOG KWIKPEN 100 UNIT/ML KwikPen, INJECT 6 TO 10 UNITS INTO THE SKIN BEFORE EACH MEAL THREE TIMES A DAY, Disp: 15 pen, Rfl: 1 .  ibuprofen (ADVIL,MOTRIN) 200 MG tablet, Take 200 mg by mouth every 6 (six) hours as needed., Disp: , Rfl:  .  Insulin Detemir (LEVEMIR FLEXTOUCH) 100 UNIT/ML Pen, INJECT 18 UNITS INTO THE SKIN DAILY AT 10 PM, Disp: 15 pen, Rfl: 3 .  Lancet Device MISC, by Does not apply route. UAD for TID blood glucose monitoring, Disp: , Rfl:  .  LEVEMIR FLEXTOUCH 100 UNIT/ML Pen, INJECT 20 UNITS INTO THE SKIN DAILY AT 10PM, Disp: 15 mL, Rfl: 0 .  Magnesium 200 MG TABS, Take 1 tablet (200 mg total) by mouth daily., Disp: 30 each, Rfl: 11 .  meclizine (ANTIVERT) 25 MG tablet, Take 25 mg by mouth 2 (two) times daily., Disp: , Rfl:  .  metFORMIN (GLUCOPHAGE) 1000 MG tablet, TAKE ONE TABLET BY MOUTH TWICE A DAY WITH MEALS, Disp: 180 tablet, Rfl: 0 .  Multiple Vitamins-Minerals (CENTRUM SILVER PO), Take 1 tablet by mouth daily. , Disp: , Rfl:  .  Naproxen Sodium (ALEVE) 220 MG CAPS, Take by mouth as needed., Disp: , Rfl:  .  omeprazole (PRILOSEC) 20 MG  capsule, TAKE ONE CAPSULE BY MOUTH DAILY, Disp: 90 capsule, Rfl: 1 .  ONETOUCH DELICA LANCETS 75Z MISC, USE TO TEST BLOOD SUGAR FOUR TIMES A DAY AS DIRECTED, Disp: 400 each, Rfl: 3 .  OVER THE COUNTER MEDICATION, , Disp: , Rfl:  .  pravastatin (PRAVACHOL) 40 MG tablet, TAKE ONE TABLET BY MOUTH EVERY EVENING, Disp: 90 tablet, Rfl: 2 .  Propylene Glycol (SYSTANE BALANCE) 0.6 % SOLN, Apply to eye. 2 drops each eye once daily, Disp: , Rfl:  .  vitamin B-12 (CYANOCOBALAMIN) 1000 MCG tablet, Take 1,000 mcg by mouth daily., Disp: , Rfl:   EXAM:  Vitals:   12/12/18 0754  BP: 124/60  Pulse: (!) 55  Temp: 98.1 F (36.7 C)    Body mass index is 27.71 kg/m.  GENERAL:  vitals reviewed and listed above, alert, oriented, appears well hydrated and in no acute distress  HEENT: atraumatic, conjunttiva clear, no obvious abnormalities on inspection of external nose and ears  NECK: no obvious masses on inspection  LUNGS: clear to auscultation bilaterally, no wheezes, rales or rhonchi, good air movement  CV: HRRR, no peripheral edema  MS: moves all extremities without noticeable abnormality, walks with cane cautiously.  SKIN: small hyperkeratotic scaly lesion R helix x1  PSYCH: pleasant and cooperative, no obvious depression or anxiety  ASSESSMENT AND PLAN:  Discussed the following assessment and plan:  AK (actinic keratosis)  DDD (degenerative disc disease), cervical  Type 2 diabetes mellitus with diabetic polyneuropathy, with long-term current use of insulin (HCC)  Hyperlipidemia associated with type 2 diabetes mellitus (HCC)  Hypertension associated with diabetes ([place])  Chronic diastolic congestive heart failure ([place])  Coronary artery disease due to lipid rich plaque  Diabetic peripheral neuropathy associated with type 2 diabetes mellitus ([place])  -discussed options for AK vs possible skin cancer - he wanted to try cryo - freeze thaw x3, tolerated well, follow up 1 month -he plans  to see Dr. Arnoldo Morale about ongoing neck pain, fall precuations -other issues stable, consider labs at follow up -follow up 1 month -Patient advised to return or notify a doctor immediately if symptoms worsen or persist or new concerns arise.  There are no Patient Instructions on file for this visit.  [name], DO

## 2018-12-12 ENCOUNTER — Encounter: Payer: Self-pay | Admitting: Family Medicine

## 2018-12-12 ENCOUNTER — Telehealth: Payer: Self-pay | Admitting: Internal Medicine

## 2018-12-12 ENCOUNTER — Ambulatory Visit: Payer: Medicare Other | Admitting: Family Medicine

## 2018-12-12 ENCOUNTER — Other Ambulatory Visit: Payer: Self-pay

## 2018-12-12 VITALS — BP 124/60 | HR 55 | Temp 98.1°F | Ht 70.0 in | Wt 193.1 lb

## 2018-12-12 DIAGNOSIS — M5030 Other cervical disc degeneration, unspecified cervical region: Secondary | ICD-10-CM

## 2018-12-12 DIAGNOSIS — E1169 Type 2 diabetes mellitus with other specified complication: Secondary | ICD-10-CM

## 2018-12-12 DIAGNOSIS — I10 Essential (primary) hypertension: Secondary | ICD-10-CM

## 2018-12-12 DIAGNOSIS — L570 Actinic keratosis: Secondary | ICD-10-CM

## 2018-12-12 DIAGNOSIS — I5032 Chronic diastolic (congestive) heart failure: Secondary | ICD-10-CM

## 2018-12-12 DIAGNOSIS — E1159 Type 2 diabetes mellitus with other circulatory complications: Secondary | ICD-10-CM

## 2018-12-12 DIAGNOSIS — Z794 Long term (current) use of insulin: Secondary | ICD-10-CM

## 2018-12-12 DIAGNOSIS — E1142 Type 2 diabetes mellitus with diabetic polyneuropathy: Secondary | ICD-10-CM | POA: Diagnosis not present

## 2018-12-12 DIAGNOSIS — I2510 Atherosclerotic heart disease of native coronary artery without angina pectoris: Secondary | ICD-10-CM

## 2018-12-12 DIAGNOSIS — I2583 Coronary atherosclerosis due to lipid rich plaque: Secondary | ICD-10-CM

## 2018-12-12 DIAGNOSIS — E785 Hyperlipidemia, unspecified: Secondary | ICD-10-CM

## 2018-12-12 MED ORDER — INSULIN DETEMIR 100 UNIT/ML FLEXPEN: PEN_INJECTOR | SUBCUTANEOUS | 2 refills | Status: DC

## 2018-12-12 NOTE — Telephone Encounter (Signed)
This has been sent

## 2018-12-12 NOTE — Patient Instructions (Signed)
BEFORE YOU LEAVE: -follow up: 1 month for ear lesion  Call for appointment with Dr. Arnoldo Morale about the neck pain.  Use caution with walking and getting up and down to prevent falls.   We recommend the following healthy lifestyle for LIFE: 1) Small portions. But, make sure to get regular (at least 3 per day), healthy meals and small healthy snacks if needed.  2) Eat a healthy clean diet.   TRY TO EAT: -at least 5-7 servings of low sugar, colorful, and nutrient rich vegetables per day (not corn, potatoes or bananas.) -berries are the best choice if you wish to eat fruit (only eat small amounts if trying to reduce weight)  -lean meets (fish, white meat of chicken or [place]) -vegan proteins for some meals - beans or tofu, whole grains, nuts and seeds -Replace bad fats with good fats - good fats include: fish, nuts and seeds, canola oil, olive oil -small amounts of low fat or non fat dairy -small amounts of100 % whole grains - check the lables -drink plenty of water  AVOID: -SUGAR, sweets, anything with added sugar, corn syrup or sweeteners - must read labels as even foods advertised as "healthy" often are loaded with sugar -if you must have a sweetener, small amounts of stevia may be best -sweetened beverages and artificially sweetened beverages -simple starches (rice, bread, potatoes, pasta, chips, etc - small amounts of 100% whole grains are ok) -red meat, pork, butter -fried foods, fast food, processed food, excessive dairy, eggs and coconut.  3)Get at least 150 minutes of sweaty aerobic exercise per week.  4)Reduce stress - consider counseling, meditation and relaxation to balance other aspects of your life.

## 2018-12-12 NOTE — Telephone Encounter (Signed)
MEDICATION: Insulin Detemir (LEVEMIR FLEXTOUCH) 100 UNIT/ML Pen  PHARMACY:  [facility] 90 DAY SUPPLY :   IS PATIENT OUT OF MEDICATION:  IF NOT; HOW MUCH IS LEFT: 1 injection left  LAST APPOINTMENT DATE: @12 /24/2019  NEXT APPOINTMENT DATE:@1 /22/2020  DO WE HAVE YOUR PERMISSION TO LEAVE A DETAILED MESSAGE:  OTHER COMMENTS:    **Let patient know to contact pharmacy at the end of the day to make sure medication is ready. **  ** Please notify patient to allow 48-72 hours to process**  **Encourage patient to contact the pharmacy for refills or they can request refills through [facility]**

## 2018-12-24 ENCOUNTER — Other Ambulatory Visit: Payer: Self-pay | Admitting: Internal Medicine

## 2019-01-01 ENCOUNTER — Encounter: Payer: Self-pay | Admitting: Internal Medicine

## 2019-01-01 ENCOUNTER — Ambulatory Visit: Payer: Medicare Other | Admitting: Internal Medicine

## 2019-01-01 VITALS — BP 120/60 | HR 66 | Ht 70.0 in | Wt 192.0 lb

## 2019-01-01 DIAGNOSIS — E785 Hyperlipidemia, unspecified: Secondary | ICD-10-CM

## 2019-01-01 DIAGNOSIS — Z794 Long term (current) use of insulin: Secondary | ICD-10-CM | POA: Diagnosis not present

## 2019-01-01 DIAGNOSIS — E1142 Type 2 diabetes mellitus with diabetic polyneuropathy: Secondary | ICD-10-CM

## 2019-01-01 DIAGNOSIS — E1169 Type 2 diabetes mellitus with other specified complication: Secondary | ICD-10-CM

## 2019-01-01 LAB — POCT GLYCOSYLATED HEMOGLOBIN (HGB A1C): HEMOGLOBIN A1C: 7.2 % — AB (ref 4.0–5.6)

## 2019-01-01 NOTE — Addendum Note (Signed)
Addended by: Cardell Peach I on: 01/01/2019 01:07 PM   Modules accepted: Orders

## 2019-01-01 NOTE — Patient Instructions (Signed)
Please continue: - Metformin 1000 mg 2x a day - Levemir 18 units at night - Humalog 15 min before a meal:  4 units before a small meal (use 4 units before breakfast) 6 units before a regular meal 8 units before a large meal Please subtract 2 units if you plan to be active after a meal.  Please come back for a follow-up appointment in 4 months. 

## 2019-01-01 NOTE — Progress Notes (Signed)
Subjective:     Patient ID: [name], male   DOB: October 15, 1933, 85 y.o.   MRN: [id number]  HPI [name] is a very pleasant 85 y.o. man returning for management of DM2, dx 07/2012, uncontrolled, insulin-dependent, with complications (CAD, peripheral neuropathy, PAD). Last visit 4 months ago.  He continues to use a walker after his fall in 09/2015 as he still has weakness in legs and poor equilibrium - feels these are worse.   He completed physical therapy but does not feel that it helped.  He is seeing [name].  He had a cervical MRI that showed osteoarthritis and he also got steroid injections in the neck.  Sugars were higher after injections.   He tried acupuncture >> pain better for few days after the sessions - had 8 sessions.    He saw [name] (neurosurgery) >> will have further imaging. Presumed dx: OA of neck.  He takes Aleve and extra strength Tylenol.  He recently had another fall due to missing a stool - 12/04/2018.  Last HbA1C was: Lab Results  Component Value Date   HGBA1C 6.9 (A) 08/29/2018   HGBA1C 7.7 04/26/2018   HGBA1C 7.1 12/27/2017   He is on: - Metformin 1000 mg 2x a day - Levemir 18 units at night - Humalog 15 min before a meal:  4 units before a small meal (use 4 units before breakfast) 6 units before a regular meal 8 units before a large meal Please subtract 2 units if you plan to be active after a meal.  We reviewed his log-checks sugars twice a day: - am:  78-126 >> 71-120, 125 >> 80-120, 135 - 2h after b'fast: 82-126 >> 65, 88-145 >> 108-130 - before lunch:106-139 >> 76-136, 150, 195 >> 78, 92-157, 175 - 2h after lunch: 135-160 >> 150 >> 97-144 >> 107-152, 180 - dinner:122-165 (snack) >> 113, 131-170 (snack) >> 103-160 (snack) - 2h after dinner: 109-175 (ate out) >> 89-156, 190 >> 91, 130-175, 185 - bedtime: 114-170 >> 117-154, 160 >> 99-165, 170 - nighttime:80s, 81-127, 155 >> 73-150 >> 110-140 Has hypoglycemia awareness in the  70s.  -No CKD. Last BUN/Cr: Lab Results  Component Value Date   BUN 17 08/05/2018   CREATININE 0.87 08/05/2018  He is not on an ACE inhibitor or ARB. -+ HL; last lipid panel: Lab Results  Component Value Date   CHOL 119 08/05/2018   HDL 44.90 08/05/2018   LDLCALC 59 08/05/2018   TRIG 76.0 08/05/2018   CHOLHDL 3 08/05/2018  On pravastatin. -He does have numbness and tingling in his feet.  He uses a B complex and compounded neuropathy cream Sees [name] at [facility]. - Last eye exam 10/2018: No DR ([name]).  He had cataract surgery in 08/2015.  He has macular degeneration  >> on Preservision Areds vitamins. On ASA 81.  He had a heart catheterization on 09/03/2014 >> 2 small Blockages >> no intervention other than statins. 2D ECHO with normal EF. In 08/2015 he was hospitalized for ?sepsis with strep pneumoniae after acquiring left lower lobe pneumonia.  Review of Systems Constitutional: no weight gain/no weight loss, no fatigue, no subjective hyperthermia, no subjective hypothermia, + nocturia Eyes: no blurry vision, no xerophthalmia ENT: no sore throat, no nodules palpated in neck, no dysphagia, no odynophagia, no hoarseness Cardiovascular: no CP/no SOB/no palpitations/no leg swelling Respiratory: no cough/no SOB/no wheezing Gastrointestinal: no N/no V/no D/no C/no acid reflux Musculoskeletal: no muscle aches/+ joint aches  Skin: no rashes, no hair loss Neurological: no tremors/+ numbness/+ tingling/no dizziness  I reviewed pt's medications, allergies, PMH, social hx, family hx, and changes were documented in the history of present illness. Otherwise, unchanged from my initial visit note.   Objective:   Physical Exam BP 120/60   Pulse 66   Ht 5\' 10"  (1.778 m) Comment: measured  Wt 192 lb (87.1 kg)   SpO2 95%   BMI 27.55 kg/m  Body mass index is 27.55 kg/m.  Wt Readings from Last 3 Encounters:  01/01/19 192 lb (87.1 kg)  12/12/18 193 lb 1.6 oz (87.6  kg)  12/05/18 192 lb 5 oz (87.2 kg)   Constitutional: overweight, in NAD, walks with a walker Eyes: PERRLA, EOMI, no exophthalmos ENT: moist mucous membranes, no thyromegaly, no cervical lymphadenopathy Cardiovascular: RRR, No RG, +1/6 SEM Respiratory: CTA B Gastrointestinal: abdomen soft, NT, ND, BS+ Musculoskeletal: no deformities, strength intact in all 4 Skin: moist, warm, no rashes Neurological: no tremor with outstretched hands, DTR not checked  Assessment:     1. DM2, insulin-dependent, uncontrolled, with complications; target X7D for him: 7-7.5% - CAD - peripheral neuropathy - PAD Dr. [name]  2. PN - 2/2 DM  3. HL  Plan:     1. Pt with longstanding, previously uncontrolled type 2 diabetes, on basal-bolus insulin regimen and metformin, with improvement of control in the last 2 years.  His sugars decreased lately so we decreased the dose of Humalog with activity and I also advised him to use a slightly lower dose of Levemir if planning to be active after a meal.  Last visit, HbA1c was excellent, at 6.9%, improved. - at this visit, sugars are at or close to goal. He has higher sugars before lunch and dinner usually 2/2 snack (so not really fasting). No lows. - no changes are needed in his med regimen for now - I advised him to: Patient Instructions  Please continue: - Metformin 1000 mg 2x a day - Levemir 18 units at night - Humalog 15 min before a meal:  4 units before a small meal (use 4 units before breakfast) 6 units before a regular meal 8 units before a large meal Please subtract 2 units if you plan to be active after a meal.  Please come back for a follow-up appointment in 4 months.  - today, HbA1c is 7.2% (higher) - continue checking sugars at different times of the day - check 3x a day, rotating checks - advised for yearly eye exams >> he is UTD - Return to clinic in 4 mo with sugar log     2. PN -Due to diabetes -Sees [name] with podiatry -Uses  a B complex daily and neuropathy cream as needed -Stable  3. HL - Reviewed latest lipid panel from 07/2018: All fractions at goal Lab Results  Component Value Date   CHOL 119 08/05/2018   HDL 44.90 08/05/2018   LDLCALC 59 08/05/2018   TRIG 76.0 08/05/2018   CHOLHDL 3 08/05/2018  - Continues pravastatin without side effects.  Philemon Kingdom, MD PhD [facility] Endocrinology

## 2019-01-11 NOTE — Progress Notes (Signed)
HPI:  Using dictation device. Unfortunately this device frequently misinterprets words/phrases.  Follow up ear lesion: -presumed AK vs other R helix last visit and pt opted for cryo performed 12/12/18 -reports:seems to have improved - he thought may be gone -denies:pruritis, bleeding, swelling -regular follow up chronic disease due in 02/2019  B12 deficiency: -he wonders if dose if correct -sometimes when legs are up in recliner with get cramps in hamstrings - he feels started after decreasing dose of b12 -taking 58mcg -no increased weakness, numbness   ROS: See pertinent positives and negatives per HPI.  Past Medical History:  Diagnosis Date  . Anemia   . Atherosclerotic PVD with intermittent claudication ([place])   . CAD (coronary artery disease)   . Diabetes mellitus    type 2 with peripheral neuropathy  . Diastolic CHF, chronic ([place])   . Diverticulosis   . DVT (deep venous thrombosis) ([place])   . Dyslipidemia   . Edema 08/19/2014  . GERD (gastroesophageal reflux disease)    hx hiatal hernia  . HTN (hypertension)   . IBS (irritable bowel syndrome)    Hx: of  . Osteoarthritis   . Overactive bladder    Hx: of stress incontinence  . Pneumonia 2002   history of  . Shingles   . Varicose veins of lower extremities with other complications 1/61/0960    Past Surgical History:  Procedure Laterality Date  . BACK SURGERY    . CATARACT EXTRACTION Bilateral 08/10/2015,08/15/2015  . COLONOSCOPY     Hx: of  . CTS bilateral    . KNEE ARTHROSCOPY  1988, 1992   bilateral  . LAMINECTOMY  08/18/2013   DECOMPRESSIVE LAMINECTOMIES  . LEFT AND RIGHT HEART CATHETERIZATION WITH CORONARY ANGIOGRAM N/A 09/03/2014   Procedure: LEFT AND RIGHT HEART CATHETERIZATION WITH CORONARY ANGIOGRAM;  Surgeon: [name], MD;  Location[facility] C[facility] LAB;  Service: Cardiovascular;  Laterality: N/A;  . LUMBAR LAMINECTOMY/DECOMPRESSION MICRODISCECTOMY N/A 08/18/2013   Procedure: Lumbar two-three, lumbar  three-four, lumbar four-five decompressive laminectomies;  Surgeon: [name], MD;  Location: [place] NEURO ORS;  Service: Neurosurgery;  Laterality: N/A;  . lumbar left arm  1961  . TONSILLECTOMY    . TOTAL KNEE ARTHROPLASTY  2005, 2007  . wrist tendon surgery      Family History  Problem Relation Age of Onset  . Other Mother 94       CABG  . Heart disease Mother   . Hypertension Mother   . Hyperlipidemia Son     SOCIAL HX: see hpi   Current Outpatient Medications:  .  amLODipine (NORVASC) 5 MG tablet, TAKE ONE TABLET BY MOUTH DAILY, Disp: 90 tablet, Rfl: 3 .  aspirin 81 MG tablet, Take 81 mg by mouth daily. , Disp: , Rfl:  .  B-D UF III MINI PEN NEEDLES 31G X 5 MM MISC, USE TO TEST BLOOD SUGAR FOUR TIMES A DAY, Disp: 200 each, Rfl: 0 .  furosemide (LASIX) 40 MG tablet, TAKE ONE TABLET BY MOUTH DAILY, Disp: 90 tablet, Rfl: 2 .  glucose blood (ONETOUCH VERIO) test strip, USE TO TEST BLOOD SUGAR THREE TIMES A DAY AS DIRECTED, Disp: 250 each, Rfl: 4 .  HUMALOG KWIKPEN 100 UNIT/ML KwikPen, INJECT 6 TO 10 UNITS INTO THE SKIN BEFORE EACH MEAL THREE TIMES A DAY, Disp: 15 pen, Rfl: 1 .  ibuprofen (ADVIL,MOTRIN) 200 MG tablet, Take 200 mg by mouth every 6 (six) hours as needed., Disp: , Rfl:  .  Insulin Detemir (LEVEMIR FLEXTOUCH)  100 UNIT/ML Pen, INJECT 20 UNITS INTO THE SKIN DAILY AT 10PM, Disp: 15 mL, Rfl: 2 .  Magnesium 200 MG TABS, Take 1 tablet (200 mg total) by mouth daily., Disp: 30 each, Rfl: 11 .  meclizine (ANTIVERT) 25 MG tablet, Take 25 mg by mouth 2 (two) times daily., Disp: , Rfl:  .  metFORMIN (GLUCOPHAGE) 1000 MG tablet, TAKE ONE TABLET BY MOUTH TWICE A DAY WITH MEALS, Disp: 180 tablet, Rfl: 0 .  Multiple Vitamins-Minerals (CENTRUM SILVER PO), Take 1 tablet by mouth daily. , Disp: , Rfl:  .  Naproxen Sodium (ALEVE) 220 MG CAPS, Take by mouth as needed., Disp: , Rfl:  .  omeprazole (PRILOSEC) 20 MG capsule, TAKE ONE CAPSULE BY MOUTH DAILY, Disp: 90 capsule, Rfl: 1 .   ONETOUCH DELICA LANCETS 41D MISC, USE TO TEST BLOOD SUGAR FOUR TIMES A DAY AS DIRECTED, Disp: 400 each, Rfl: 3 .  OVER THE COUNTER MEDICATION, , Disp: , Rfl:  .  pravastatin (PRAVACHOL) 40 MG tablet, TAKE ONE TABLET BY MOUTH EVERY EVENING, Disp: 90 tablet, Rfl: 2 .  Propylene Glycol (SYSTANE BALANCE) 0.6 % SOLN, Apply to eye. 2 drops each eye once daily, Disp: , Rfl:  .  vitamin B-12 (CYANOCOBALAMIN) 1000 MCG tablet, Take 1,000 mcg by mouth daily., Disp: , Rfl:   EXAM:  Vitals:   01/13/19 0858  BP: 100/60  Pulse: 67  Temp: 97.8 F (36.6 C)    Body mass index is 27.09 kg/m.  GENERAL: vitals reviewed and listed above, alert, oriented, appears well hydrated and in no acute distress  HEENT: atraumatic, conjunttiva clear, no obvious abnormalities on inspection of external nose and ears  NECK: no obvious masses on inspection  SKIN: very small fine scaly patch R helix  MS: walks with walker  PSYCH: pleasant and cooperative, no obvious depression or anxiety  ASSESSMENT AND PLAN:  Discussed the following assessment and plan:  Vitamin B12 deficiency - Plan: Vitamin B12  Leg cramps  AK (actinic keratosis)  -he opted for LN2 again for the likely AK - aware of other potential etiologies and advised if does not resolve completely that he see dermatologist. Free thaw x 3 . Tolerated well. -discussed potential etiologies for leg cramps and treatment. He opted to check b12 and try changing chair positioning. -follow up in 1-2 months as planned.  -Patient advised to return or notify a doctor immediately if symptoms worsen or persist or new concerns arise.  Patient Instructions  BEFORE YOU LEAVE: -lab for b12 check -follow up: in March or April for regular follow up  We have ordered labs or studies at this visit. It can take up to 1-2 weeks for results and processing. IF results require follow up or explanation, we will call you with instructions. Clinically stable results will be  released to your [facility]. If you have not heard from [place] or cannot find your results in [facility] in 2 weeks please contact our office at [phone].  If you are not yet signed up for [facility], please consider signing up.  Try changing up chair positioning to see if helps with cramps.          [name], DO

## 2019-01-13 ENCOUNTER — Encounter: Payer: Self-pay | Admitting: Family Medicine

## 2019-01-13 ENCOUNTER — Ambulatory Visit: Payer: Medicare Other | Admitting: Family Medicine

## 2019-01-13 VITALS — BP 100/60 | HR 67 | Temp 97.8°F | Ht 70.0 in | Wt 188.8 lb

## 2019-01-13 DIAGNOSIS — L570 Actinic keratosis: Secondary | ICD-10-CM | POA: Diagnosis not present

## 2019-01-13 DIAGNOSIS — R252 Cramp and spasm: Secondary | ICD-10-CM

## 2019-01-13 DIAGNOSIS — E538 Deficiency of other specified B group vitamins: Secondary | ICD-10-CM | POA: Diagnosis not present

## 2019-01-13 LAB — VITAMIN B12: VITAMIN B 12: 872 pg/mL (ref 211–911)

## 2019-01-13 NOTE — Patient Instructions (Signed)
BEFORE YOU LEAVE: -lab for b12 check -follow up: in March or April for regular follow up  We have ordered labs or studies at this visit. It can take up to 1-2 weeks for results and processing. IF results require follow up or explanation, we will call you with instructions. Clinically stable results will be released to your [place]. If you have not heard from [place] or cannot find your results in [facility] in 2 weeks please contact our office at [phone].  If you are not yet signed up for [facility], please consider signing up.  Try changing up chair positioning to see if helps with cramps.

## 2019-02-21 ENCOUNTER — Other Ambulatory Visit: Payer: Self-pay | Admitting: Cardiology

## 2019-02-21 ENCOUNTER — Other Ambulatory Visit: Payer: Self-pay | Admitting: Family Medicine

## 2019-03-03 ENCOUNTER — Other Ambulatory Visit: Payer: Self-pay | Admitting: Internal Medicine

## 2019-03-13 ENCOUNTER — Telehealth: Payer: Self-pay | Admitting: *Deleted

## 2019-03-13 NOTE — Telephone Encounter (Signed)
Info added to the appt notes for 4/6.

## 2019-03-13 NOTE — Telephone Encounter (Signed)
Copied from [place] [id number]. Topic: General - Other >> Mar 13, 2019  8:11 AM Leward Quan A wrote: Reason for CRM: Patient called to say that he is ok with doing an over the phone visit with Dr Maudie Mercury.

## 2019-03-17 ENCOUNTER — Ambulatory Visit (INDEPENDENT_AMBULATORY_CARE_PROVIDER_SITE_OTHER): Payer: Medicare Other | Admitting: Family Medicine

## 2019-03-17 ENCOUNTER — Other Ambulatory Visit: Payer: Self-pay

## 2019-03-17 DIAGNOSIS — Z794 Long term (current) use of insulin: Secondary | ICD-10-CM

## 2019-03-17 DIAGNOSIS — I5032 Chronic diastolic (congestive) heart failure: Secondary | ICD-10-CM

## 2019-03-17 DIAGNOSIS — E1142 Type 2 diabetes mellitus with diabetic polyneuropathy: Secondary | ICD-10-CM

## 2019-03-17 DIAGNOSIS — E1159 Type 2 diabetes mellitus with other circulatory complications: Secondary | ICD-10-CM

## 2019-03-17 DIAGNOSIS — I2510 Atherosclerotic heart disease of native coronary artery without angina pectoris: Secondary | ICD-10-CM

## 2019-03-17 DIAGNOSIS — I10 Essential (primary) hypertension: Secondary | ICD-10-CM

## 2019-03-17 DIAGNOSIS — M5030 Other cervical disc degeneration, unspecified cervical region: Secondary | ICD-10-CM

## 2019-03-17 DIAGNOSIS — I152 Hypertension secondary to endocrine disorders: Secondary | ICD-10-CM

## 2019-03-17 DIAGNOSIS — E1169 Type 2 diabetes mellitus with other specified complication: Secondary | ICD-10-CM | POA: Diagnosis not present

## 2019-03-17 DIAGNOSIS — I2583 Coronary atherosclerosis due to lipid rich plaque: Secondary | ICD-10-CM

## 2019-03-17 DIAGNOSIS — E785 Hyperlipidemia, unspecified: Secondary | ICD-10-CM

## 2019-03-17 NOTE — Progress Notes (Signed)
Virtual Visit via Video Note  In light of the [place] pandemic connected with Isiaah on 03/17/19 at  9:45 AM EDT by a video enabled telemedicine application and verified that I am speaking with the correct person using two identifiers. He was not able to use the video portion of this application, but was able to complete the visit successfully via audio.  Location patient: home Location provider:work or home office Persons participating in the virtual visit: patient, provider  I discussed the limitations of evaluation and management by telemedicine and the availability of in person appointments. The patient expressed understanding and agreed to proceed.   HPI:  [name] is a pleasant 85 y.o. here for follow up. Chronic medical problems summarized below were reviewed for changes and stability and were updated as needed below. These issues and their treatment remain stable for the most part.  Reports son brings them groceries. They are not leaving their house given the [place] 19 pandemic. Using walker. Seeing Dr. Arnoldo Morale and Dr. Assunta Curtis for his neck and may get steroid injections once COVID19 pandemic over. Denies CP, SOB, DOE, treatment intolerance or new symptoms.   AWV 05/21/18 Diabetes - Last X3G1.8 2/99 -complications: peripheral neuropathy -managed by endocrinology, Dr. Cruzita Lederer  -meds: metformin,Levimir, insulin with meals, asa -eye exam: sees Dr. Idolina Primer; will see Dr. Idolina Primer this November -foot exam: utd  Essential hypertensionw/ diabetes - stable  -meds: norvasc, doxasosin, lasix- sees cardiology  CAD, Chronic diastolic congestive heart failure - 2-D echo on 08/26/14 showed EF 60-65% with grade 1 diastolic dysfunction -chronic LE edema -cath 2015 30% LM, 40-50%LAD -nuclear medicine stress test 01/2018, neg for ischemia per cardiology notes -meds: asa, statinstopped 06/2017 for leg complaints and elevated CK, then restarted- managed by his cardiologist, lasix   -cardiologist: Dr. [name]  OAB (overactive bladder) -meds: Cardura -on myrbetriq and vesicare in the past -stable  -[place] urologist, Dr. Patsy [place]  GERD: -meds: prilosec  Hyperlipemia - stable  - meds: Pravastatinstopped by cardiology due to leg cramps and elevated CK- then restarted  Anemia: -during hospital stay 2016 and mild in the past on review cardiology records  -no bleeding  Chronic neck pain/CTS: -sees Dr. Nelva Bush, [facility], for management -also seeing Dr. Arnoldo Morale and Dr. Assunta Curtis - will be doing steroid inj but this is postponed right now due to [place] pandemic -also has responded well to OMM and accupuncture and is doing better currently -meds: tramadol   ROS: See pertinent positives and negatives per HPI.  Past Medical History:  Diagnosis Date  . Anemia   . Atherosclerotic PVD with intermittent claudication ([place])   . CAD (coronary artery disease)   . Diabetes mellitus    type 2 with peripheral neuropathy  . Diastolic CHF, chronic ([place])   . Diverticulosis   . DVT (deep venous thrombosis) ([place])   . Dyslipidemia   . Edema 08/19/2014  . GERD (gastroesophageal reflux disease)    hx hiatal hernia  . HTN (hypertension)   . IBS (irritable bowel syndrome)    Hx: of  . Osteoarthritis   . Overactive bladder    Hx: of stress incontinence  . Pneumonia 2002   history of  . Shingles   . Varicose veins of lower extremities with other complications 3/71/6967    Past Surgical History:  Procedure Laterality Date  . BACK SURGERY    . CATARACT EXTRACTION Bilateral 08/10/2015,08/15/2015  . COLONOSCOPY     Hx: of  . CTS bilateral    . KNEE  ARTHROSCOPY  1988, 1992   bilateral  . LAMINECTOMY  08/18/2013   DECOMPRESSIVE LAMINECTOMIES  . LEFT AND RIGHT HEART CATHETERIZATION WITH CORONARY ANGIOGRAM N/A 09/03/2014   Procedure: LEFT AND RIGHT HEART CATHETERIZATION WITH CORONARY ANGIOGRAM;  Surgeon: [name], MD;  Location: [facility] CATH LAB;  Service:  Cardiovascular;  Laterality: N/A;  . LUMBAR LAMINECTOMY/DECOMPRESSION MICRODISCECTOMY N/A 08/18/2013   Procedure: Lumbar two-three, lumbar three-four, lumbar four-five decompressive laminectomies;  Surgeon: [name], MD;  Location: [facility] NEURO ORS;  Service: Neurosurgery;  Laterality: N/A;  . lumbar left arm  1961  . TONSILLECTOMY    . TOTAL KNEE ARTHROPLASTY  2005, 2007  . wrist tendon surgery      Family History  Problem Relation Age of Onset  . Other Mother 83       CABG  . Heart disease Mother   . Hypertension Mother   . Hyperlipidemia Son     SOCIAL HX: see hpi   Current Outpatient Medications:  .  amLODipine (NORVASC) 5 MG tablet, TAKE ONE TABLET BY MOUTH DAILY, Disp: 90 tablet, Rfl: 2 .  aspirin 81 MG tablet, Take 81 mg by mouth daily. , Disp: , Rfl:  .  B-D UF III MINI PEN NEEDLES 31G X 5 MM MISC, USE TO TEST BLOOD SUGAR FOUR TIMES A DAY, Disp: 200 each, Rfl: 0 .  furosemide (LASIX) 40 MG tablet, TAKE ONE TABLET BY MOUTH DAILY, Disp: 90 tablet, Rfl: 2 .  glucose blood (ONETOUCH VERIO) test strip, USE TO TEST BLOOD SUGAR THREE TIMES A DAY AS DIRECTED, Disp: 250 each, Rfl: 4 .  HUMALOG KWIKPEN 100 UNIT/ML KwikPen, INJECT 6 TO 10 UNITS INTO THE SKIN BEFORE EACH MEAL THREE TIMES A DAY, Disp: 15 mL, Rfl: 0 .  ibuprofen (ADVIL,MOTRIN) 200 MG tablet, Take 200 mg by mouth every 6 (six) hours as needed., Disp: , Rfl:  .  Insulin Detemir (LEVEMIR FLEXTOUCH) 100 UNIT/ML Pen, INJECT 20 UNITS INTO THE SKIN DAILY AT 10PM, Disp: 15 mL, Rfl: 2 .  Magnesium 200 MG TABS, Take 1 tablet (200 mg total) by mouth daily., Disp: 30 each, Rfl: 11 .  meclizine (ANTIVERT) 25 MG tablet, Take 25 mg by mouth 2 (two) times daily., Disp: , Rfl:  .  metFORMIN (GLUCOPHAGE) 1000 MG tablet, TAKE ONE TABLET BY MOUTH TWICE A DAY WITH MEALS, Disp: 180 tablet, Rfl: 0 .  Multiple Vitamins-Minerals (CENTRUM SILVER PO), Take 1 tablet by mouth daily. , Disp: , Rfl:  .  Naproxen Sodium (ALEVE) 220 MG CAPS, Take by  mouth as needed., Disp: , Rfl:  .  omeprazole (PRILOSEC) 20 MG capsule, TAKE ONE CAPSULE BY MOUTH DAILY, Disp: 90 capsule, Rfl: 1 .  ONETOUCH DELICA LANCETS 41L MISC, USE TO TEST BLOOD SUGAR FOUR TIMES A DAY AS DIRECTED, Disp: 400 each, Rfl: 3 .  OVER THE COUNTER MEDICATION, , Disp: , Rfl:  .  pravastatin (PRAVACHOL) 40 MG tablet, TAKE ONE TABLET BY MOUTH EVERY EVENING, Disp: 90 tablet, Rfl: 2 .  Propylene Glycol (SYSTANE BALANCE) 0.6 % SOLN, Apply to eye. 2 drops each eye once daily, Disp: , Rfl:  .  vitamin B-12 (CYANOCOBALAMIN) 1000 MCG tablet, Take 1,000 mcg by mouth daily., Disp: , Rfl:   EXAM:  VITALS per patient if applicable: none  GENERAL: alert, oriented, no audible acute distress  LUNGS: no audible respiritory distress, no audible gross SOB, gasping or wheezing  PSYCH/NEURO: pleasant and cooperative, speech and thought processing grossly intact  ASSESSMENT AND  PLAN:  Discussed the following assessment and plan:  Type 2 diabetes mellitus with diabetic polyneuropathy, with long-term current use of insulin (HCC)  Hyperlipidemia associated with type 2 diabetes mellitus ([place])  Hypertension associated with diabetes ([place])  Coronary artery disease due to lipid rich plaque  Chronic diastolic congestive heart failure (HCC)  DDD (degenerative disc disease), cervical  Seems to be doing ok currently. He prefers to avoid the office for now to avoid exposure to [place]. He plans to see Dr. Ethlyn Gallery in a few months (prefers to postpone visit next week). Reviewed medications and labs. He reports he has follow up visits set up with his endocrinologist and his heart doctor. Currently declines refills. Follow up 2-3 months with Dr. Ethlyn Gallery.   I discussed the assessment and treatment plan with the patient. The patient was provided an opportunity to ask questions and all were answered. The patient agreed with the plan and demonstrated an understanding of the instructions.      Follow up instructions: Advised assistant Wendie Simmer to help patient arrange the following: -he prefers to reschedule [facility] visit with Dr. Ethlyn Gallery to 1-2 months out rather then next week. Please assist. Thanks.    [name], DO

## 2019-03-24 ENCOUNTER — Encounter: Payer: Medicare Other | Admitting: Family Medicine

## 2019-03-28 ENCOUNTER — Other Ambulatory Visit: Payer: Self-pay | Admitting: Internal Medicine

## 2019-04-24 ENCOUNTER — Other Ambulatory Visit: Payer: Self-pay | Admitting: Internal Medicine

## 2019-05-08 ENCOUNTER — Other Ambulatory Visit: Payer: Self-pay

## 2019-05-12 ENCOUNTER — Encounter: Payer: Self-pay | Admitting: Internal Medicine

## 2019-05-12 ENCOUNTER — Ambulatory Visit: Payer: Medicare Other | Admitting: Internal Medicine

## 2019-05-12 ENCOUNTER — Other Ambulatory Visit: Payer: Self-pay

## 2019-05-12 VITALS — BP 130/70 | HR 69 | Ht 70.0 in | Wt 187.0 lb

## 2019-05-12 DIAGNOSIS — E1169 Type 2 diabetes mellitus with other specified complication: Secondary | ICD-10-CM

## 2019-05-12 DIAGNOSIS — E785 Hyperlipidemia, unspecified: Secondary | ICD-10-CM

## 2019-05-12 DIAGNOSIS — E1142 Type 2 diabetes mellitus with diabetic polyneuropathy: Secondary | ICD-10-CM

## 2019-05-12 DIAGNOSIS — Z794 Long term (current) use of insulin: Secondary | ICD-10-CM | POA: Diagnosis not present

## 2019-05-12 LAB — POCT GLYCOSYLATED HEMOGLOBIN (HGB A1C): Hemoglobin A1C: 6.6 % — AB (ref 4.0–5.6)

## 2019-05-12 NOTE — Progress Notes (Signed)
Subjective:     Patient ID: [name], male   DOB: August 02, 1933, 85 y.o.   MRN: [id number]  HPI [name] is a very pleasant 85 y.o. man returning for management of DM2, dx 07/2012, uncontrolled, insulin-dependent, with complications (CAD, peripheral neuropathy, PAD). Last visit 4 months ago.  He continues to use a walker after his fall in 09/2015 as he still has weakness in legs and poor equilibrium-feels these are worse.   He completed physical therapy but does not feel that it helped. He tried acupuncture >> pain better for few days after the sessions - had 8 sessions.   He is seeing Dr. Nelva Bush.  He had a cervical MRI that showed osteoarthritis and he also got steroid injections in the neck.  Sugars were higher after injections. He also saw Dr. Arnoldo Morale (neurosurgery). He is now seeing pain management >> steroid inj.   Last HbA1C was: Lab Results  Component Value Date   HGBA1C 7.2 (A) 01/01/2019   HGBA1C 6.9 (A) 08/29/2018   HGBA1C 7.7 04/26/2018   He is on: - Metformin 1000 mg 2x a day - Levemir 18 >> 16 units at night - Humalog 15 min before a meal:  4 units before a small meal (use 4 units before breakfast) 6 units before a regular meal 8 units before a large meal Please subtract 2 units if you plan to be active after a meal.  I reviewed his log-checks sugars 2-4x a day: - am:  78-126 >> 71-120, 125 >> 80-120, 135 >> 75-120 - 2h after b'fast: 82-126 >> 65, 88-145 >> 108-130 >> n/c - before lunch: 76-136, 150, 195 >> 78, 92-157, 175 >> 78-150, 160 - 2h after lunch:150 >> 97-144 >> 107-152, 180 >> 182 - dinner:113, 131-170 (snack) >> 103-160 (snack) >> 94-170 - 2h after dinner: 89-156, 190 >> 91, 130-175, 185 >> 120, 163 - bedtime: 114-170 >> 117-154, 160 >> 99-165, 170 >> 135-167, 170 - nighttime:80s, 81-127, 155 >> 73-150 >> 110-140 >> 115-158 Has hypoglycemia awareness in the 70s.  -No CKD. Last BUN/Cr: Lab Results  Component Value Date   BUN 17 08/05/2018   CREATININE 0.87 08/05/2018  He is not on an ACE inhibitor/ARB. -+ HL; last lipid panel: Lab Results  Component Value Date   CHOL 119 08/05/2018   HDL 44.90 08/05/2018   LDLCALC 59 08/05/2018   TRIG 76.0 08/05/2018   CHOLHDL 3 08/05/2018  On pravastatin. -+ Numbness and tingling in his feet.  He takes a B complex daily and compounded neuropathy cream. Sees Dr. Barkley Bruns at [facility]. - Last eye exam 10/2018: No DR (Dr. Alois Cliche).  He had cataract surgery in 08/2015.  + Macular degeneration>> on Preservision Areds vitamins. On ASA 81.  He had a heart catheterization on 09/03/2014 >> 2 small Blockages >> no intervention other than statins. 2D ECHO with normal EF. In 08/2015 he was hospitalized for ?sepsis with strep pneumoniae after acquiring left lower lobe pneumonia.  Review of Systems Constitutional: no weight gain/no weight loss, no fatigue, no subjective hyperthermia, no subjective hypothermia, + nocturia  Eyes: no blurry vision, no xerophthalmia ENT: no sore throat, no nodules palpated in neck, no dysphagia, no odynophagia, no hoarseness Cardiovascular: no CP/no SOB/no palpitations/no leg swelling Respiratory: no cough/no SOB/no wheezing Gastrointestinal: no N/no V/no D/no C/no acid reflux Musculoskeletal: no muscle aches/+ joint aches (neck pain) Skin: no rashes, no hair loss Neurological: no tremors/+ numbness/+ tingling/no dizziness, + weakness - legs  I reviewed  pt's medications, allergies, PMH, social hx, family hx, and changes were documented in the history of present illness. Otherwise, unchanged from my initial visit note.  Past Medical History:  Diagnosis Date  . Anemia   . Atherosclerotic PVD with intermittent claudication ([place])   . CAD (coronary artery disease)   . Diabetes mellitus    type 2 with peripheral neuropathy  . Diastolic CHF, chronic ([place])   . Diverticulosis   . DVT (deep venous thrombosis) ([place])   . Dyslipidemia   . Edema 08/19/2014  . GERD  (gastroesophageal reflux disease)    hx hiatal hernia  . HTN (hypertension)   . IBS (irritable bowel syndrome)    Hx: of  . Osteoarthritis   . Overactive bladder    Hx: of stress incontinence  . Pneumonia 2002   history of  . Shingles   . Varicose veins of lower extremities with other complications 07/28/2992   Past Surgical History:  Procedure Laterality Date  . BACK SURGERY    . CATARACT EXTRACTION Bilateral 08/10/2015,08/15/2015  . COLONOSCOPY     Hx: of  . CTS bilateral    . KNEE ARTHROSCOPY  1988, 1992   bilateral  . LAMINECTOMY  08/18/2013   DECOMPRESSIVE LAMINECTOMIES  . LEFT AND RIGHT HEART CATHETERIZATION WITH CORONARY ANGIOGRAM N/A 09/03/2014   Procedure: LEFT AND RIGHT HEART CATHETERIZATION WITH CORONARY ANGIOGRAM;  Surgeon: [name], MD;  Location: [facility] CATH LAB;  Service: Cardiovascular;  Laterality: N/A;  . LUMBAR LAMINECTOMY/DECOMPRESSION MICRODISCECTOMY N/A 08/18/2013   Procedure: Lumbar two-three, lumbar three-four, lumbar four-five decompressive laminectomies;  Surgeon: [name], MD;  Location: [facility] NEURO ORS;  Service: Neurosurgery;  Laterality: N/A;  . lumbar left arm  1961  . TONSILLECTOMY    . TOTAL KNEE ARTHROPLASTY  2005, 2007  . wrist tendon surgery     Social History   Socioeconomic History  . Marital status: Married    Spouse name: Not on file  . Number of children: 2  . Years of education: Not on file  . Highest education level: Not on file  Occupational History  . Not on file  Social Needs  . Financial resource strain: Not on file  . Food insecurity:    Worry: Not on file    Inability: Not on file  . Transportation needs:    Medical: Not on file    Non-medical: Not on file  Tobacco Use  . Smoking status: Never Smoker  . Smokeless tobacco: Never Used  Substance and Sexual Activity  . Alcohol use: No    Alcohol/week: 0.0 standard drinks  . Drug use: No  . Sexual activity: Not on file  Lifestyle  . Physical activity:    Days  per week: Not on file    Minutes per session: Not on file  . Stress: Not on file  Relationships  . Social connections:    Talks on phone: Not on file    Gets together: Not on file    Attends religious service: Not on file    Active member of club or organization: Not on file    Attends meetings of clubs or organizations: Not on file    Relationship status: Not on file  . Intimate partner violence:    Fear of current or ex partner: Not on file    Emotionally abused: Not on file    Physically abused: Not on file    Forced sexual activity: Not on file  Other Topics Concern  .  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 amLODipine (NORVASC) 5 MG tablet TAKE ONE TABLET BY MOUTH DAILY 90 tablet 2  . aspirin 81 MG tablet Take 81 mg by mouth daily.     . B-D UF III MINI PEN NEEDLES 31G X 5 MM MISC USE TO TEST BLOOD SUGAR FOUR TIMES A DAY 200 each 0  . furosemide (LASIX) 40 MG tablet TAKE ONE TABLET BY MOUTH DAILY 90 tablet 2  . glucose blood (ONETOUCH VERIO) test strip USE TO TEST BLOOD SUGAR THREE TIMES A DAY AS DIRECTED 250 each 4  . HUMALOG KWIKPEN 100 UNIT/ML KwikPen INJECT 6 TO 10 UNITS INTO THE SKIN BEFORE EACH MEAL THREE TIMES A DAY 15 mL 0  . ibuprofen (ADVIL,MOTRIN) 200 MG tablet Take 200 mg by mouth every 6 (six) hours as needed.    . Insulin Detemir (LEVEMIR FLEXTOUCH) 100 UNIT/ML Pen INJECT 20 UNITS INTO THE SKIN DAILY AT 10PM 15 mL 2  . Magnesium 200 MG TABS Take 1 tablet (200 mg total) by mouth daily. 30 each 11  . meclizine (ANTIVERT) 25 MG tablet Take 25 mg by mouth 2 (two) times daily.    .  metFORMIN (GLUCOPHAGE) 1000 MG tablet TAKE ONE TABLET BY MOUTH TWICE A DAY WITH MEALS 180 tablet 0  . Multiple Vitamins-Minerals (CENTRUM SILVER PO) Take 1 tablet by mouth daily.     . Naproxen Sodium (ALEVE) 220 MG CAPS Take by mouth as needed.    Marland Kitchen omeprazole (PRILOSEC) 20 MG capsule TAKE ONE CAPSULE BY MOUTH DAILY 90 capsule 1  . ONETOUCH DELICA LANCETS 29J MISC USE TO TEST BLOOD SUGAR FOUR TIMES A DAY AS DIRECTED 400 each 3  . OVER THE COUNTER MEDICATION     . pravastatin (PRAVACHOL) 40 MG tablet TAKE ONE TABLET BY MOUTH EVERY EVENING 90 tablet 2  . Propylene Glycol (SYSTANE BALANCE) 0.6 % SOLN Apply to eye. 2 drops each eye once daily    . vitamin B-12 (CYANOCOBALAMIN) 1000 MCG tablet Take 1,000 mcg by mouth daily.     No current facility-administered medications on file prior to visit.    Allergies  Allergen Reactions  . Ciprofloxacin Other (See Comments)    "made me have a weird feeling. Disoriented."   . Sulfamethoxazole Rash   Family History  Problem Relation Age of Onset  . Other Mother 60       CABG  . Heart disease Mother   . Hypertension Mother   . Hyperlipidemia Son     Objective:   Physical Exam BP 130/70   Pulse 69   Ht 5\' 10"  (1.778 m)   Wt 187 lb (84.8 kg)   SpO2 96%   BMI 26.83 kg/m  Body mass index is 26.83 kg/m.  Wt Readings from Last 3 Encounters:  05/12/19 187 lb (84.8 kg)  01/13/19 188 lb 12.8 oz (85.6 kg)  01/01/19 192 lb (87.1 kg)   Constitutional: Slightly  overweight, in NAD, walks with a walker Eyes: PERRLA, EOMI, no  exophthalmos ENT: moist mucous membranes, no thyromegaly, no cervical lymphadenopathy Cardiovascular: RRR, No RG, +1/6 SEM Respiratory: CTA B Gastrointestinal: abdomen soft, NT, ND, BS+ Musculoskeletal: no deformities, strength intact in all 4 Skin: moist, warm, no rashes Neurological: no tremor with outstretched hands, DTR normal in all 4  Assessment:     1. DM2, insulin-dependent, uncontrolled, with complications;  target H4R for him: 7-7.5% - CAD - peripheral neuropathy - PAD Dr. [name]  2. PN - 2/2 DM  3. HL  Plan:     1. Pt with longstanding, previously uncontrolled type 2 diabetes, on basal/bolus insulin regimen and metformin, with improvement of control in the last 2 and half years.  His sugars decreased lately so we had to decrease the dose of Humalog and advised him to use a slightly lower dose of Levemir or if planning to be active after a meal.  At last visit, his HbA1c was slightly higher, at 7.2%, increased from 6.9%.  I believe that this is due to the holidays.  Also, she snacks before meals and his sugars before lunch and dinner are higher.  We did not change his regimen at that time. -At this visit, sugars are slightly improved compared to before, but still has sugars higher than target before meals.  These are usually because of snacks.  He continues to adjust his diet. Today, HbA1c is 6.6% (better).  We will not change his regimen for now - I advised him to: Patient Instructions  Please continue: - Metformin 1000 mg 2x a day - Levemir 18 units at night - Humalog 15 min before a meal:  4 units before a small meal (use 4 units before breakfast) 6 units before a regular meal 8 units before a large meal Please subtract 2 units if you plan to be active after a meal.  Please come back for a follow-up appointment in 4 months.  - continue checking sugars at different times of the day - check 3x a day, rotating checks - advised for yearly eye exams >> he is UTD - Return to clinic in 4 mo with sugar log     2. PN -Related to diabetes -Stable -Sees Dr. Barkley Bruns with podiatry -Uses a B complex daily and neuropathy cream as needed  3. HL - Reviewed latest lipid panel from 07/2018: All fractions at goal Lab Results  Component Value Date   CHOL 119 08/05/2018   HDL 44.90 08/05/2018   LDLCALC 59 08/05/2018   TRIG 76.0 08/05/2018   CHOLHDL 3 08/05/2018  - Continues pravastatin  without side effects.  Philemon Kingdom, MD PhD [facility] Endocrinology

## 2019-05-12 NOTE — Patient Instructions (Signed)
Please continue: - Metformin 1000 mg 2x a day - Levemir 18 units at night - Humalog 15 min before a meal:  4 units before a small meal (use 4 units before breakfast) 6 units before a regular meal 8 units before a large meal Please subtract 2 units if you plan to be active after a meal.  Please come back for a follow-up appointment in 4 months.

## 2019-05-12 NOTE — Addendum Note (Signed)
Addended by: Cardell Peach I on: 05/12/2019 09:16 AM   Modules accepted: Orders

## 2019-05-15 ENCOUNTER — Other Ambulatory Visit: Payer: Self-pay | Admitting: Internal Medicine

## 2019-05-27 ENCOUNTER — Ambulatory Visit: Payer: Medicare Other

## 2019-05-28 ENCOUNTER — Encounter: Payer: Medicare Other | Admitting: Family Medicine

## 2019-05-30 ENCOUNTER — Other Ambulatory Visit: Payer: Self-pay | Admitting: Internal Medicine

## 2019-05-30 ENCOUNTER — Other Ambulatory Visit: Payer: Self-pay | Admitting: Cardiology

## 2019-06-19 ENCOUNTER — Other Ambulatory Visit: Payer: Self-pay | Admitting: Internal Medicine

## 2019-06-19 ENCOUNTER — Ambulatory Visit: Payer: Medicare Other

## 2019-07-06 ENCOUNTER — Other Ambulatory Visit: Payer: Self-pay | Admitting: Internal Medicine

## 2019-07-20 ENCOUNTER — Other Ambulatory Visit: Payer: Self-pay | Admitting: Adult Health

## 2019-07-30 ENCOUNTER — Encounter: Payer: Self-pay | Admitting: Family Medicine

## 2019-07-30 ENCOUNTER — Other Ambulatory Visit: Payer: Self-pay

## 2019-07-30 ENCOUNTER — Ambulatory Visit (INDEPENDENT_AMBULATORY_CARE_PROVIDER_SITE_OTHER): Payer: Medicare Other | Admitting: Family Medicine

## 2019-07-30 VITALS — BP 117/64 | HR 67 | Wt 180.0 lb

## 2019-07-30 DIAGNOSIS — I2510 Atherosclerotic heart disease of native coronary artery without angina pectoris: Secondary | ICD-10-CM

## 2019-07-30 DIAGNOSIS — E1169 Type 2 diabetes mellitus with other specified complication: Secondary | ICD-10-CM | POA: Diagnosis not present

## 2019-07-30 DIAGNOSIS — E1159 Type 2 diabetes mellitus with other circulatory complications: Secondary | ICD-10-CM | POA: Diagnosis not present

## 2019-07-30 DIAGNOSIS — E1142 Type 2 diabetes mellitus with diabetic polyneuropathy: Secondary | ICD-10-CM | POA: Diagnosis not present

## 2019-07-30 DIAGNOSIS — I152 Hypertension secondary to endocrine disorders: Secondary | ICD-10-CM

## 2019-07-30 DIAGNOSIS — E785 Hyperlipidemia, unspecified: Secondary | ICD-10-CM

## 2019-07-30 DIAGNOSIS — K219 Gastro-esophageal reflux disease without esophagitis: Secondary | ICD-10-CM

## 2019-07-30 DIAGNOSIS — Z794 Long term (current) use of insulin: Secondary | ICD-10-CM

## 2019-07-30 DIAGNOSIS — N3281 Overactive bladder: Secondary | ICD-10-CM

## 2019-07-30 DIAGNOSIS — I2583 Coronary atherosclerosis due to lipid rich plaque: Secondary | ICD-10-CM

## 2019-07-30 DIAGNOSIS — C4490 Unspecified malignant neoplasm of skin, unspecified: Secondary | ICD-10-CM

## 2019-07-30 DIAGNOSIS — I10 Essential (primary) hypertension: Secondary | ICD-10-CM

## 2019-07-30 DIAGNOSIS — I5032 Chronic diastolic (congestive) heart failure: Secondary | ICD-10-CM

## 2019-07-30 HISTORY — DX: Unspecified malignant neoplasm of skin, unspecified: C44.90

## 2019-07-30 NOTE — Progress Notes (Signed)
Virtual Visit via telephone Note  I connected with [name]   on 07/30/19 at  2:00 PM EDT by a video enabled telemedicine application and verified that I am speaking with the correct person using two identifiers.  Location patient: home Location provider:work office Persons participating in the virtual visit: patient, provider  I discussed the limitations of evaluation and management by telemedicine and the availability of in person appointments. The patient expressed understanding and agreed to proceed.   [name] DOB: [birth year] Encounter date: 07/30/2019  This is a 85 y.o. male who presents to establish care. No chief complaint on file.   History of present illness: Balance issue - using walker in house. Has had this for a couple of years. Dizziness since childhood; spells of vertigo. Balance worse in last year. Takes medication for dizziness. Meclizine does help with this.   Neck feels better after completing injection series.   HTN:127/63 this morning; 117/64 with pulse 67 this afternoon.  CAD: follows with cardiology - bp and cholesterol controlled.   CHF, diastolic: follows with cardiology.   GERD: takes the omeprazole which keeps sx away. If stops medicine then sx restart.   DMII: follows with Dr. Cruzita Lederer. A1C 6/1 was 6.6. has been working on weight loss - watching what he is eating and working on making healthier choices.   HL: on the pravastatin 40mg  daily.   DDD: neck doing better after injections.   OAB: myrbetriq helps quite a lot. Sees urology for this. Tried multiple other medications without success.   Does see derm at surgical center  Past Medical History:  Diagnosis Date  . Anemia   . Atherosclerotic PVD with intermittent claudication ([place])   . CAD (coronary artery disease)   . Diabetes mellitus    type 2 with peripheral neuropathy  . Diastolic CHF, chronic ([place])   . Diverticulosis   . DVT (deep venous thrombosis) ([place])   . Dyslipidemia    . Edema 08/19/2014  . GERD (gastroesophageal reflux disease)    hx hiatal hernia  . HTN (hypertension)   . IBS (irritable bowel syndrome)    Hx: of  . Osteoarthritis   . Overactive bladder    Hx: of stress incontinence  . Pneumonia 2002   history of  . Shingles   . Varicose veins of lower extremities with other complications 1/91/4782   Past Surgical History:  Procedure Laterality Date  . BACK SURGERY    . CATARACT EXTRACTION Bilateral 08/10/2015,08/15/2015  . COLONOSCOPY     Hx: of  . CTS bilateral    . KNEE ARTHROSCOPY  1988, 1992   bilateral  . LEFT AND RIGHT HEART CATHETERIZATION WITH CORONARY ANGIOGRAM N/A 09/03/2014   Procedure: LEFT AND RIGHT HEART CATHETERIZATION WITH CORONARY ANGIOGRAM;  Surgeon: [name], MD;  Location: [facility] CATH LAB;  Service: Cardiovascular;  Laterality: N/A;  . LUMBAR LAMINECTOMY/DECOMPRESSION MICRODISCECTOMY N/A 08/18/2013   Procedure: Lumbar two-three, lumbar three-four, lumbar four-five decompressive laminectomies;  Surgeon: [name], MD;  Location: [facility] NEURO ORS;  Service: Neurosurgery;  Laterality: N/A;  . TONSILLECTOMY    . TOTAL KNEE ARTHROPLASTY Bilateral 2005, 2007   Allergies  Allergen Reactions  . Ciprofloxacin Other (See Comments)    "made me have a weird feeling. Disoriented."   . Sulfamethoxazole Rash   No outpatient medications have been marked as taking for the 07/30/19 encounter (Office Visit) with Caren Macadam, MD.   Social History   Tobacco Use  . Smoking status:  Never Smoker  . Smokeless tobacco: Never Used  Substance Use Topics  . Alcohol use: No    Alcohol/week: 0.0 standard drinks   Family History  Problem Relation Age of Onset  . Other Mother 59       CABG  . Heart disease Mother   . Hypertension Mother   . Hyperlipidemia Son      Review of Systems  Constitutional: Negative for chills, fatigue and fever.  Respiratory: Negative for cough, chest tightness, shortness of breath and wheezing.    Cardiovascular: Negative for chest pain, palpitations and leg swelling.    Objective:  BP 117/64   Pulse 67   Wt 180 lb (81.6 kg)   BMI 25.83 kg/m   Weight: 180 lb (81.6 kg)   BP Readings from Last 3 Encounters:  07/30/19 117/64  05/12/19 130/70  01/13/19 100/60   Wt Readings from Last 3 Encounters:  07/30/19 180 lb (81.6 kg)  05/12/19 187 lb (84.8 kg)  01/13/19 188 lb 12.8 oz (85.6 kg)    EXAM:  GENERAL: sounds alert, oriented.  LUNGS: no difficulty with breathing during conversation PSYCH/NEURO: pleasant and cooperative, no obvious depression or anxiety, speech and thought processing grossly intact  Assessment/Plan  1. Skin cancer Follows with skin surg center.   2. Hypertension associated with diabetes ([place]) Well controlled. Continue current medication.   3. Type 2 diabetes mellitus with diabetic polyneuropathy, with long-term current use of insulin ([place]) Working on healthy eating, weight loss. A1C great on last check.   4. Hyperlipidemia associated with type 2 diabetes mellitus ([place]) Will recheck soon. Continue statin.   5. OAB (overactive bladder): Improved on myrbetriq  6. Gastroesophageal reflux disease, esophagitis presence not specified Stable on omeprazole.   7. Coronary artery disease due to lipid rich plaque Following with cardiology. Lipids/htn controlled.   8. Chronic diastolic congestive heart failure ([facility]) Stable. Stress testing 01/2018 normal with EF 55 and normal wall motion.   Return for bloodwork then AWV with [facility]. Will get flu shot when comes in for bloodwork.   I discussed the assessment and treatment plan with the patient. The patient was provided an opportunity to ask questions and all were answered. The patient agreed with the plan and demonstrated an understanding of the instructions.   The patient was advised to call back or seek an in-person evaluation if the symptoms worsen or if the condition fails to improve as  anticipated.  I provided 30 minutes of non-face-to-face time during this encounter.   Micheline Rough, MD

## 2019-08-02 ENCOUNTER — Other Ambulatory Visit: Payer: Self-pay | Admitting: Adult Health

## 2019-08-04 ENCOUNTER — Telehealth: Payer: Self-pay | Admitting: *Deleted

## 2019-08-04 NOTE — Telephone Encounter (Signed)
-----   Message from Grayling Congress, [place] sent at 07/30/2019  4:16 PM EDT -----  ----- Message ----- From: Caren Macadam, MD Sent: 07/30/2019   2:36 PM EDT To: Grayling Congress, CMA  Please schedule bloodwork appointment for him. He (and wife) would like to get flu shots done at that time as well. Then schedule AWV with [facility] in November.

## 2019-08-04 NOTE — Telephone Encounter (Signed)
AWV scheduled for 11/5 at 10am.

## 2019-08-04 NOTE — Telephone Encounter (Signed)
Appt scheduled for 8/26 at 7:40am for labs and per [facility] he will administer the flu vaccine also.  Patient stated Mrs Koob will call back for a later appt.

## 2019-08-05 ENCOUNTER — Other Ambulatory Visit: Payer: Self-pay | Admitting: Internal Medicine

## 2019-08-06 ENCOUNTER — Other Ambulatory Visit: Payer: Self-pay

## 2019-08-06 ENCOUNTER — Other Ambulatory Visit (INDEPENDENT_AMBULATORY_CARE_PROVIDER_SITE_OTHER): Payer: Medicare Other

## 2019-08-06 DIAGNOSIS — N3281 Overactive bladder: Secondary | ICD-10-CM | POA: Diagnosis not present

## 2019-08-06 DIAGNOSIS — Z794 Long term (current) use of insulin: Secondary | ICD-10-CM | POA: Diagnosis not present

## 2019-08-06 DIAGNOSIS — I10 Essential (primary) hypertension: Secondary | ICD-10-CM | POA: Diagnosis not present

## 2019-08-06 DIAGNOSIS — E1169 Type 2 diabetes mellitus with other specified complication: Secondary | ICD-10-CM

## 2019-08-06 DIAGNOSIS — E1159 Type 2 diabetes mellitus with other circulatory complications: Secondary | ICD-10-CM | POA: Diagnosis not present

## 2019-08-06 DIAGNOSIS — Z23 Encounter for immunization: Secondary | ICD-10-CM

## 2019-08-06 DIAGNOSIS — E785 Hyperlipidemia, unspecified: Secondary | ICD-10-CM | POA: Diagnosis not present

## 2019-08-06 DIAGNOSIS — E1142 Type 2 diabetes mellitus with diabetic polyneuropathy: Secondary | ICD-10-CM | POA: Diagnosis not present

## 2019-08-06 DIAGNOSIS — D649 Anemia, unspecified: Secondary | ICD-10-CM

## 2019-08-06 LAB — CBC WITH DIFFERENTIAL/PLATELET
Basophils Absolute: 0 10*3/uL (ref 0.0–0.1)
Basophils Relative: 0.4 % (ref 0.0–3.0)
Eosinophils Absolute: 0.1 10*3/uL (ref 0.0–0.7)
Eosinophils Relative: 1.6 % (ref 0.0–5.0)
HCT: 38 % — ABNORMAL LOW (ref 39.0–52.0)
Hemoglobin: 12.4 g/dL — ABNORMAL LOW (ref 13.0–17.0)
Lymphocytes Relative: 57.6 % — ABNORMAL HIGH (ref 12.0–46.0)
Lymphs Abs: 4.7 10*3/uL — ABNORMAL HIGH (ref 0.7–4.0)
MCHC: 32.5 g/dL (ref 30.0–36.0)
MCV: 91.4 fl (ref 78.0–100.0)
Monocytes Absolute: 0.4 10*3/uL (ref 0.1–1.0)
Monocytes Relative: 5.1 % (ref 3.0–12.0)
Neutro Abs: 2.9 10*3/uL (ref 1.4–7.7)
Neutrophils Relative %: 35.3 % — ABNORMAL LOW (ref 43.0–77.0)
Platelets: 193 10*3/uL (ref 150.0–400.0)
RBC: 4.16 Mil/uL — ABNORMAL LOW (ref 4.22–5.81)
RDW: 14.7 % (ref 11.5–15.5)
WBC: 8.2 10*3/uL (ref 4.0–10.5)

## 2019-08-06 LAB — URINALYSIS, ROUTINE W REFLEX MICROSCOPIC
Bilirubin Urine: NEGATIVE
Ketones, ur: NEGATIVE
Leukocytes,Ua: NEGATIVE
Nitrite: NEGATIVE
Specific Gravity, Urine: 1.02 (ref 1.000–1.030)
Total Protein, Urine: NEGATIVE
Urine Glucose: NEGATIVE
Urobilinogen, UA: 0.2 (ref 0.0–1.0)
pH: 6 (ref 5.0–8.0)

## 2019-08-06 LAB — COMPREHENSIVE METABOLIC PANEL
ALT: 15 U/L (ref 0–53)
AST: 21 U/L (ref 0–37)
Albumin: 4.6 g/dL (ref 3.5–5.2)
Alkaline Phosphatase: 37 U/L — ABNORMAL LOW (ref 39–117)
BUN: 19 mg/dL (ref 6–23)
CO2: 29 mEq/L (ref 19–32)
Calcium: 9.7 mg/dL (ref 8.4–10.5)
Chloride: 102 mEq/L (ref 96–112)
Creatinine, Ser: 0.84 mg/dL (ref 0.40–1.50)
GFR: 87.09 mL/min (ref >60.00)
Glucose, Bld: 142 mg/dL — ABNORMAL HIGH (ref 70–99)
Potassium: 4.7 mEq/L (ref 3.5–5.1)
Sodium: 140 mEq/L (ref 135–145)
Total Bilirubin: 0.7 mg/dL (ref 0.2–1.2)
Total Protein: 7 g/dL (ref 6.0–8.3)

## 2019-08-06 LAB — MICROALBUMIN / CREATININE URINE RATIO
Creatinine,U: 84.2 mg/dL
Microalb Creat Ratio: 2.4 mg/g (ref 0.0–30.0)
Microalb, Ur: 2 mg/dL — ABNORMAL HIGH (ref 0.0–1.9)

## 2019-08-06 LAB — LIPID PANEL
Cholesterol: 121 mg/dL (ref 0–200)
HDL: 49.9 mg/dL (ref >39.00)
LDL Cholesterol: 50 mg/dL (ref 0–99)
NonHDL: 71.03
Total CHOL/HDL Ratio: 2
Triglycerides: 104 mg/dL (ref 0.0–149.0)
VLDL: 20.8 mg/dL (ref 0.0–40.0)

## 2019-08-16 ENCOUNTER — Other Ambulatory Visit: Payer: Self-pay | Admitting: Internal Medicine

## 2019-08-21 NOTE — Addendum Note (Signed)
Addended by: Agnes Lawrence on: 08/21/2019 03:29 PM   Modules accepted: Orders

## 2019-08-22 ENCOUNTER — Encounter: Payer: Self-pay | Admitting: Gastroenterology

## 2019-08-25 ENCOUNTER — Other Ambulatory Visit: Payer: Self-pay | Admitting: Internal Medicine

## 2019-08-27 ENCOUNTER — Other Ambulatory Visit: Payer: Self-pay | Admitting: Internal Medicine

## 2019-08-27 ENCOUNTER — Other Ambulatory Visit: Payer: Self-pay | Admitting: Family Medicine

## 2019-08-29 ENCOUNTER — Other Ambulatory Visit: Payer: Self-pay | Admitting: Internal Medicine

## 2019-09-01 ENCOUNTER — Telehealth: Payer: Self-pay | Admitting: *Deleted

## 2019-09-01 NOTE — Telephone Encounter (Signed)
I can speak with her if needed, but I did do some additional digging since she had asked and found a little more info that I hadn't seen before:  So he was supposed to repeat colonoscopy but in 2015, but instead of this, he had office visit with GI to discuss pros/cons and they elected for multiple reasons not to repeat colonoscopy (this was taken from note in 12/2013: On 2007 colonoscopy he was written to have had previous adenomatous polyps however I do not see that to truly be the case. He has never had precancerous polyps removed from his colon. He is 76 and his health is gradually failing. I don't think colon cancer screening is necessarily a very relevant question for him anymore.  Call if he has any significant changes or bleeding in his bowels.)  His anemia has been very stable since that time (actually improved); so if no blood in stools or abdominal concerns, I do think it would be ok just to keep monitoring for stability. I hadn't seen the note from GI previously because I was only looking under completed procedures, so I feel reassured after reading their note and all of the considerations they had/review of previous pathology, etc.   Hopefully this helps and we can cancel that GI referral. Still happy to talk with her if further questions!

## 2019-09-01 NOTE — Telephone Encounter (Signed)
Patient's wife [name] called needing clarification about patient's lab results and recommendations for him to see GI.  Advised her that patient was anemic and Dr. Ethlyn Gallery recommended that patient follow up with GI for evaluation of anemia as he was supposed to have had a follow up colonoscopy in 2015, but did not.  She states that they would prefer to have further evaluation by Dr. Ethlyn Gallery and follow up labs 11/12/2019 before seeing GI.  Advised patient's wife I will forward note to Dr. Ethlyn Gallery to make her aware of their wishes.  [name] also requests that she always be notified of patient's lab results as he sometimes is not clear on them and unable to tell her what results are.

## 2019-09-02 NOTE — Telephone Encounter (Signed)
I called [facility], spoke with Alyse Low and advised to cancel the appt on 10/2 per below.

## 2019-09-02 NOTE — Telephone Encounter (Signed)
I called the pt and informed his wife of the message below.  Mrs Engert stated she would prefer to cancel the referral.  Also wanted to let us know the patient does not always comprehend what is said after his visits here and she asked to be updated with any information.  I advised Mrs Inouye, she can come in to the office with the pt for his visits, despite COVID as in cases like this he would need her assistance and she agreed.  Also sent a link to her cell number to set up a Mychart acct per pts wife's request.

## 2019-09-09 NOTE — Progress Notes (Deleted)
Virtual Visit via Video Note   This visit type was conducted due to national recommendations for restrictions regarding the COVID-19 Pandemic (e.g. social distancing) in an effort to limit this patient's exposure and mitigate transmission in our community.  Due to his co-morbid illnesses, this patient is at least at moderate risk for complications without adequate follow up.  This format is felt to be most appropriate for this patient at this time.  All issues noted in this document were discussed and addressed.  A limited physical exam was performed with this format.  Please refer to the patient's chart for his consent to telehealth for [facility]   Date:  09/09/2019   ID:  [name], DOB 1933-02-14, MRN [id number]  Patient Location:Home Provider Location: Home  PCP:  Caren Macadam, MD  Cardiologist:  Dr [name]  Evaluation Performed:  Follow-Up Visit  Chief Complaint: FU CAD  History of Present Illness:    FUCAD and diastolic CHF. ABIs 3/14 normal. Seen previously for chest pain, dyspnea and lower ext edema. Echocardiogram September 2015 showed normal LV function, grade 1 diastolic dysfunction and mild mitral regurgitation. Moderate left atrial enlargement. Cardiac catheterization September 2015 showed a 30% left main, 40-50% LAD and no obstructive disease in the circumflex or right coronary artery. PCWP 4. PA pressure 19/4.  Monitor January 2019 showed sinus to sinus bradycardia with PVCs and 3 beats nonsustained ventricular tachycardia.  Nuclear study February 2019 showed ejection fraction 55% with no ischemia or infarction.  Blood pressure medications were reduced previously because of dizziness. Since last seen,   The patient does not have symptoms concerning for COVID-19 infection (fever, chills, cough, or new shortness of breath).    Past Medical History:  Diagnosis Date  . Anemia   . Atherosclerotic PVD with intermittent claudication ([place])   . CAD (coronary  artery disease)   . Diabetes mellitus    type 2 with peripheral neuropathy  . Diastolic CHF, chronic ([place])   . Diverticulosis   . DVT (deep venous thrombosis) ([place])   . Dyslipidemia   . Edema 08/19/2014  . GERD (gastroesophageal reflux disease)    hx hiatal hernia  . HTN (hypertension)   . IBS (irritable bowel syndrome)    Hx: of  . Osteoarthritis   . Overactive bladder    Hx: of stress incontinence  . Pneumonia 2002   history of  . Shingles   . Varicose veins of lower extremities with other complications 0000000   Past Surgical History:  Procedure Laterality Date  . BACK SURGERY    . CATARACT EXTRACTION Bilateral 08/10/2015,08/15/2015  . COLONOSCOPY     Hx: of  . CTS bilateral    . KNEE ARTHROSCOPY  1988, 1992   bilateral  . LEFT AND RIGHT HEART CATHETERIZATION WITH CORONARY ANGIOGRAM N/A 09/03/2014   Procedure: LEFT AND RIGHT HEART CATHETERIZATION WITH CORONARY ANGIOGRAM;  Surgeon: [name], MD;  Location: [facility] CATH LAB;  Service: Cardiovascular;  Laterality: N/A;  . LUMBAR LAMINECTOMY/DECOMPRESSION MICRODISCECTOMY N/A 08/18/2013   Procedure: Lumbar two-three, lumbar three-four, lumbar four-five decompressive laminectomies;  Surgeon: [name], MD;  Location: [facility] NEURO ORS;  Service: Neurosurgery;  Laterality: N/A;  . TONSILLECTOMY    . TOTAL KNEE ARTHROPLASTY Bilateral 2005, 2007     No outpatient medications have been marked as taking for the 09/11/19 encounter (Appointment) with Lelon Perla, MD.     Allergies:   Ciprofloxacin and Sulfamethoxazole   Social History   Tobacco Use  .  Smoking status: Never Smoker  . Smokeless tobacco: Never Used  Substance Use Topics  . Alcohol use: No    Alcohol/week: 0.0 standard drinks  . Drug use: No     Family Hx: The patient's family history includes Heart disease in his mother; Hyperlipidemia in his son; Hypertension in his mother; Other (age of onset: 66) in his mother.  ROS:   Please see the history of  present illness.    No Fever, chills  or productive cough All other systems reviewed and are negative.   Recent Labs: 08/06/2019: ALT 15; BUN 19; Creatinine, Ser 0.84; Hemoglobin 12.4; Platelets 193.0; Potassium 4.7; Sodium 140   Recent Lipid Panel Lab Results  Component Value Date/Time   CHOL 121 08/06/2019 07:18 AM   TRIG 104.0 08/06/2019 07:18 AM   HDL 49.90 08/06/2019 07:18 AM   CHOLHDL 2 08/06/2019 07:18 AM   LDLCALC 50 08/06/2019 07:18 AM    Wt Readings from Last 3 Encounters:  07/30/19 180 lb (81.6 kg)  05/12/19 187 lb (84.8 kg)  01/13/19 188 lb 12.8 oz (85.6 kg)     Objective:    Vital Signs:  There were no vitals taken for this visit.   VITAL SIGNS:  reviewed NAD Answers questions appropriately Normal affect Remainder of physical examination not performed (telehealth visit; coronavirus pandemic)  ASSESSMENT & PLAN:    1. Coronary artery disease-no recurrent chest pain.  Plan to continue medical therapy with aspirin and statin. 2. Hypertension-blood pressure controlled.  Continue present medications. 3. Hyperlipidemia-continue statin.  Check lipids and liver. 4. Chronic diastolic congestive heart failure-by history patient is doing well from a volume standpoint.  Continue present dose of diuretic.  Check potassium and renal function.  COVID-19 Education: The importance of social distancing was discussed today.  Time:   Today, I have spent 18 minutes with the patient with telehealth technology discussing the above problems.     Medication Adjustments/Labs and Tests Ordered: Current medicines are reviewed at length with the patient today.  Concerns regarding medicines are outlined above.   Tests Ordered: No orders of the defined types were placed in this encounter.   Medication Changes: No orders of the defined types were placed in this encounter.   Follow Up:  Virtual Visit or In Person in 1 year(s)  Signed, Kirk Ruths, MD  09/09/2019 7:36 AM     [place]

## 2019-09-11 ENCOUNTER — Telehealth: Payer: Medicare Other | Admitting: Cardiology

## 2019-09-12 ENCOUNTER — Ambulatory Visit: Payer: Medicare Other | Admitting: Gastroenterology

## 2019-09-15 ENCOUNTER — Other Ambulatory Visit: Payer: Self-pay

## 2019-09-17 ENCOUNTER — Ambulatory Visit: Payer: Medicare Other | Admitting: Internal Medicine

## 2019-09-17 ENCOUNTER — Other Ambulatory Visit: Payer: Self-pay

## 2019-09-17 ENCOUNTER — Encounter: Payer: Self-pay | Admitting: Internal Medicine

## 2019-09-17 VITALS — BP 126/76 | HR 71 | Ht 70.0 in | Wt 183.0 lb

## 2019-09-17 DIAGNOSIS — E785 Hyperlipidemia, unspecified: Secondary | ICD-10-CM | POA: Diagnosis not present

## 2019-09-17 DIAGNOSIS — E1169 Type 2 diabetes mellitus with other specified complication: Secondary | ICD-10-CM

## 2019-09-17 DIAGNOSIS — E1142 Type 2 diabetes mellitus with diabetic polyneuropathy: Secondary | ICD-10-CM | POA: Diagnosis not present

## 2019-09-17 DIAGNOSIS — Z794 Long term (current) use of insulin: Secondary | ICD-10-CM | POA: Diagnosis not present

## 2019-09-17 LAB — POCT GLYCOSYLATED HEMOGLOBIN (HGB A1C): Hemoglobin A1C: 6.5 % — AB (ref 4.0–5.6)

## 2019-09-17 NOTE — Addendum Note (Signed)
Addended by: Cardell Peach I on: 09/17/2019 09:24 AM   Modules accepted: Orders

## 2019-09-17 NOTE — Patient Instructions (Addendum)
Please continue: - Metformin 1000 mg 2x a day - Levemir 16 units at night - Humalog 15 min before a meal:  4 units before a small meal (use 4 units before breakfast) 6 units before a regular meal 8 units before a large meal Please subtract 2 units if you plan to be active after a meal.  Please come back for a follow-up appointment in 4-6 months.

## 2019-09-17 NOTE — Progress Notes (Signed)
Subjective:     Patient ID: [name], male   DOB: 01-25-1933, 85 y.o.   MRN: [id number]  HPI [name] is a very pleasant 85 y.o. man returning for management of DM2, dx 07/2012, uncontrolled, insulin-dependent, with complications (CAD, peripheral neuropathy, PAD). Last visit 4 months ago:  He continues to use a walker after his fall in 09/2015 as he still has weakness in his legs and poor equilibrium.   He completed physical therapy but does not feel that it helped. He tried acupuncture >> pain better for few days after the sessions - had 8 sessions.   He is seeing Dr. Nelva Bush.  He had a cervical MRI that showed osteoarthritis and he also got steroid injections in the neck.  Sugars were higher after injections. He also saw Dr. Arnoldo Morale (neurosurgery).  He is now seeing pain management and get steroid injections - he feels 90% improved. Last inj 2.5 mo ago.  Reviewed HbA1c Lab Results  Component Value Date   HGBA1C 6.6 (A) 05/12/2019   HGBA1C 7.2 (A) 01/01/2019   HGBA1C 6.9 (A) 08/29/2018   He is on: - Metformin 1000 mg 2x a day - Levemir 18 >> 16 units at night - Humalog 15 min before a meal:  4 units before a small meal (use 4 units before breakfast) 6 units before a regular meal 8 units before a large meal Please subtract 2 units if you plan to be active after a meal.  I reviewed his log-checks sugars 2-4 times a day - am: 71-120, 125 >> 80-120, 135 >> 75-120 >> 80-110 - 2h after b'fast: 65, 88-145 >> 108-130 >> n/c >> 110-130 - before lunch: 78, 92-157, 175 >> 78-150, 160 >> 96-145, 160 - 2h after lunch: 97-144 >> 107-152, 180 >> 182 >> 160 - dinner: 103-160 (snack) >> 94-170 >> 133-175 - 2h after dinner:  91, 130-175, 185 >> 120, 163 >> 110 - bedtime:  99-165, 170 >> 135-167, 170 >> 115-170 - nighttime: 73-150 >> 110-140 >> 115-158 >> 96-145 Has hypoglycemia awareness in the 70s.  -No CKD. Last BUN/Cr: Lab Results  Component Value Date   BUN 19 08/06/2019    CREATININE 0.84 08/06/2019  Not on an ACE inhibitor/ARB. -+ HL; last lipid panel: Lab Results  Component Value Date   CHOL 121 08/06/2019   HDL 49.90 08/06/2019   LDLCALC 50 08/06/2019   TRIG 104.0 08/06/2019   CHOLHDL 2 08/06/2019  On pravastatin. -+ Numbness and tingling in his feet.  He takes a B complex daily and a compounded neuropathy cream Sees Dr. Barkley Bruns at [facility]. - Last eye exam 10/2018: No DR (Dr. Alois Cliche).  He had cataract surgery in 08/2015.  + Macular degeneration>> on Preservision Areds vitamins. On ASA 81.  He had a heart catheterization on 09/03/2014 >> 2 small Blockages >> no intervention other than statins. 2D ECHO with normal EF. In 08/2015 he was hospitalized for ?sepsis with strep pneumoniae after acquiring left lower lobe pneumonia.  Review of Systems Constitutional: no weight gain/no weight loss, no fatigue, no subjective hyperthermia, no subjective hypothermia, + nocturia Eyes: no blurry vision, no xerophthalmia ENT: no sore throat, no nodules palpated in neck, no dysphagia, no odynophagia, no hoarseness Cardiovascular: no CP/no SOB/no palpitations/no leg swelling Respiratory: no cough/no SOB/no wheezing Gastrointestinal: no N/no V/no D/no C/no acid reflux Musculoskeletal: no muscle aches/+ joint aches Skin: no rashes, no hair loss Neurological: no tremors/+ numbness/+ tingling/no dizziness  I reviewed pt's medications,  allergies, PMH, social hx, family hx, and changes were documented in the history of present illness. Otherwise, unchanged from my initial visit note.  Past Medical History:  Diagnosis Date  . Anemia   . Atherosclerotic PVD with intermittent claudication ([place])   . CAD (coronary artery disease)   . Diabetes mellitus    type 2 with peripheral neuropathy  . Diastolic CHF, chronic ([place])   . Diverticulosis   . DVT (deep venous thrombosis) ([place])   . Dyslipidemia   . Edema 08/19/2014  . GERD (gastroesophageal reflux disease)     hx hiatal hernia  . HTN (hypertension)   . IBS (irritable bowel syndrome)    Hx: of  . Osteoarthritis   . Overactive bladder    Hx: of stress incontinence  . Pneumonia 2002   history of  . Shingles   . Varicose veins of lower extremities with other complications 0000000   Past Surgical History:  Procedure Laterality Date  . BACK SURGERY    . CATARACT EXTRACTION Bilateral 08/10/2015,08/15/2015  . COLONOSCOPY     Hx: of  . CTS bilateral    . KNEE ARTHROSCOPY  1988, 1992   bilateral  . LEFT AND RIGHT HEART CATHETERIZATION WITH CORONARY ANGIOGRAM N/A 09/03/2014   Procedure: LEFT AND RIGHT HEART CATHETERIZATION WITH CORONARY ANGIOGRAM;  Surgeon: [name], MD;  Location[facility] CATH LAB;  Service: Cardiovascular;  Laterality: N/A;  . LUMBAR LAMINECTOMY/DECOMPRESSION MICRODISCECTOMY N/A 08/18/2013   Procedure: Lumbar two-three, lumbar three-four, lumbar four-five decompressive laminectomies;  Surgeon: [name], MD;  Location: [place] NEURO ORS;  Service: Neurosurgery;  Laterality: N/A;  . TONSILLECTOMY    . TOTAL KNEE ARTHROPLASTY Bilateral 2005, 2007   Social History   Socioeconomic History  . Marital status: Married    Spouse name: Not on file  . Number of children: 2  . Years of education: Not on file  . Highest education level: Not on file  Occupational History  . Not on file  Social Needs  . Financial resource strain: Not on file  . Food insecurity    Worry: Not on file    Inability: Not on file  . Transportation needs    Medical: Not on file    Non-medical: Not on file  Tobacco Use  . Smoking status: Never Smoker  . Smokeless tobacco: Never Used  Substance and Sexual Activity  . Alcohol use: No    Alcohol/week: 0.0 standard drinks  . Drug use: No  . Sexual activity: Not on file  Lifestyle  . Physical activity    Days per week: Not on file    Minutes per session: Not on file  . Stress: Not on file  Relationships  . Social Herbalist on phone:  Not on file    Gets together: Not on file    Attends religious service: Not on file    Active member of club or organization: Not on file    Attends meetings of clubs or organizations: Not on file    Relationship status: Not on file  . Intimate partner violence    Fear of current or ex partner: Not on file    Emotionally abused: Not on file    Physically abused: Not on file    Forced sexual activity: Not on file  Other Topics Concern  . Not on file  Social History Narrative   Work or School: retired Engineer, manufacturing systems Situation: lives with wife -  reports she is in good health (also my patient)   Reports grandaughter (3 yo in 2015) stays with them about 1 week out of every month      Spiritual Beliefs: Christian      Lifestyle: no regular exercise, healthy diet      Current Outpatient Medications on File Prior to Visit  Medication Sig Dispense Refill  . amLODipine (NORVASC) 5 MG tablet TAKE ONE TABLET BY MOUTH DAILY 90 tablet 2  . aspirin 81 MG tablet Take 81 mg by mouth daily.     . B-D UF III MINI PEN NEEDLES 31G X 5 MM MISC USE TO TEST BLOOD SUGAR FOUR TIMES A DAY 200 each 0  . furosemide (LASIX) 40 MG tablet TAKE ONE TABLET BY MOUTH DAILY 90 tablet 1  . HUMALOG KWIKPEN 100 UNIT/ML KwikPen INJECT 6 TO 10 UNITS INTO THE SKIN BEFORE EACH MEAL THREE TIMES A DAY 15 mL 0  . ibuprofen (ADVIL,MOTRIN) 200 MG tablet Take 200 mg by mouth every 6 (six) hours as needed.    . Lancets (ONETOUCH DELICA PLUS 123XX123) MISC USE TO TEST BLOOD SUGAR FOUR TIMES A DAY AS DIRECTED 400 each 2  . LEVEMIR FLEXTOUCH 100 UNIT/ML Pen INJECT 20 UNITS INTO THE SKIN DAILY AT 10 PM 15 mL 1  . Magnesium 200 MG TABS Take 1 tablet (200 mg total) by mouth daily. 30 each 11  . meclizine (ANTIVERT) 25 MG tablet Take 25 mg by mouth 2 (two) times daily.    . metFORMIN (GLUCOPHAGE) 1000 MG tablet TAKE ONE TABLET BY MOUTH TWICE A DAY WITH MEALS 180 tablet 0  . Multiple Vitamins-Minerals (CENTRUM SILVER PO) Take 1  tablet by mouth daily.     . Naproxen Sodium (ALEVE) 220 MG CAPS Take by mouth as needed.    Marland Kitchen omeprazole (PRILOSEC) 20 MG capsule TAKE ONE CAPSULE BY MOUTH DAILY 90 capsule 1  . ONETOUCH VERIO test strip USE TO TEST BLOOD SUGAR FOUR TIMES A DAY AS DIRECTED 400 strip 2  . OVER THE COUNTER MEDICATION     . pravastatin (PRAVACHOL) 40 MG tablet TAKE ONE TABLET BY MOUTH EVERY EVENING 90 tablet 1  . Propylene Glycol (SYSTANE BALANCE) 0.6 % SOLN Apply to eye. 2 drops each eye once daily    . vitamin B-12 (CYANOCOBALAMIN) 1000 MCG tablet Take 1,000 mcg by mouth daily.     No current facility-administered medications on file prior to visit.    Allergies  Allergen Reactions  . Ciprofloxacin Other (See Comments)    "made me have a weird feeling. Disoriented."   . Sulfamethoxazole Rash   Family History  Problem Relation Age of Onset  . Other Mother 38       CABG  . Heart disease Mother   . Hypertension Mother   . Hyperlipidemia Son     Objective:   Physical Exam BP 126/76   Pulse 71   Ht 5\' 10"  (1.778 m)   Wt 183 lb (83 kg)   SpO2 97%   BMI 26.26 kg/m  Body mass index is 26.26 kg/m.  Wt Readings from Last 3 Encounters:  09/17/19 183 lb (83 kg)  07/30/19 180 lb (81.6 kg)  05/12/19 187 lb (84.8 kg)   Constitutional: Slightly overweight, in NAD, + walks with a walker Eyes: PERRLA, EOMI, no exophthalmos ENT: moist mucous membranes, no thyromegaly, no cervical lymphadenopathy Cardiovascular: RRR, No RG, +1/6 SEM Respiratory: CTA B Gastrointestinal: abdomen soft, NT, ND, BS+ Musculoskeletal: no deformities, strength  intact in all 4 Skin: moist, warm, no rashes Neurological: no tremor with outstretched hands, DTR normal in all 4  Assessment:     1. DM2, insulin-dependent, uncontrolled, with complications; target 123XX123 for him: 7-7.5% - CAD - peripheral neuropathy - PAD Dr. [name]  2. PN - 2/2 DM  3. HL  Plan:     1. Pt with longstanding, previously uncontrolled type 2  diabetes, on basal-bolus insulin regimen and also metformin, with improvement of control in the last 3 years.  His sugars decreased lately so we could lower the dose of his Humalog and also Levemir.  At last visit sugars were slightly improved compared to before but he was still having sugars higher than target before meals due to eating snacks.  We discussed about adjusting his diet to try to eliminate snacks.  However, HbA1c was still better, at 6.6%.  We did not change his insulin doses then. -At this visit, the vast majority of his sugars are at goal, without significant hyperglycemic spikes or hypoglycemic readings.  He still has higher blood sugars before dinner since he has a snack in the afternoon, but I do not feel we need to change his regimen for now. - I advised him to: Patient Instructions  Please continue: - Metformin 1000 mg 2x a day - Levemir 16 units at night - Humalog 15 min before a meal:  4 units before a small meal (use 4 units before breakfast) 6 units before a regular meal 8 units before a large meal Please subtract 2 units if you plan to be active after a meal.  Please come back for a follow-up appointment in 4-6 months.  - we checked his HbA1c: 6.5% (slightly lower) - advised to check sugars at different times of the day - 3-4x a day, rotating check times -he is doing a great job with this and brings a carefully kept log - advised for yearly eye exams >> he is UTD - + UTD with flu shot - return to clinic in 4-6 months    2. PN -Related to diabetes -Stable -Sees Dr. Barkley Bruns with podiatry -Continues on a B complex daily and neuropathy cream as needed  3. HL -Reviewed lipid panel from 07/2019: All fractions at goal: Lab Results  Component Value Date   CHOL 121 08/06/2019   HDL 49.90 08/06/2019   LDLCALC 50 08/06/2019   TRIG 104.0 08/06/2019   CHOLHDL 2 08/06/2019  -Continues pravastatin without side effects.  Philemon Kingdom, MD PhD [facility]  Endocrinology

## 2019-09-22 ENCOUNTER — Telehealth: Payer: Medicare Other | Admitting: Cardiology

## 2019-09-22 NOTE — Progress Notes (Signed)
Virtual Visit via Video Note changed to phone visit at patient request   This visit type was conducted due to national recommendations for restrictions regarding the COVID-19 Pandemic (e.g. social distancing) in an effort to limit this patient's exposure and mitigate transmission in our community.  Due to his co-morbid illnesses, this patient is at least at moderate risk for complications without adequate follow up.  This format is felt to be most appropriate for this patient at this time.  All issues noted in this document were discussed and addressed.  A limited physical exam was performed with this format.  Please refer to the patient's chart for his consent to telehealth for [facility].   Date:  09/23/2019   ID:  [name], DOB Jan 19, 1933, MRN [id number]  Patient Location:Home Provider Location: Home  PCP:  Caren Macadam, MD  Cardiologist:  Dr [name]  Evaluation Performed:  Follow-Up Visit  Chief Complaint:  FU CAD and diastolic CHF  History of Present Illness:    FUCAD and diastolic CHF. ABIs 3/14 normal. Seen previously for chest pain, dyspnea and lower ext edema. Echocardiogram September 2015 showed normal LV function, grade 1 diastolic dysfunction and mild mitral regurgitation. Moderate left atrial enlargement. Cardiac catheterization September 2015 showed a 30% left main, 40-50% LAD and no obstructive disease in the circumflex or right coronary artery. PCWP 4. PA pressure 19/4. Monitor January 2019 showed sinus to sinus bradycardia with PVCs and 3 beats nonsustained ventricular tachycardia.  Nuclear study February 2019 showed ejection fraction 55% with no ischemia or infarction.  Blood pressure medications were reduced previously because of dizziness. Since last seen,he has some dyspnea with more vigorous activities.  No orthopnea, PND, pedal edema, chest pain or syncope.  The patient does not have symptoms concerning for COVID-19 infection (fever, chills, cough,  or new shortness of breath).    Past Medical History:  Diagnosis Date  . Anemia   . Atherosclerotic PVD with intermittent claudication ([place])   . CAD (coronary artery disease)   . Diabetes mellitus    type 2 with peripheral neuropathy  . Diastolic CHF, chronic ([place])   . Diverticulosis   . DVT (deep venous thrombosis) ([place])   . Dyslipidemia   . Edema 08/19/2014  . GERD (gastroesophageal reflux disease)    hx hiatal hernia  . HTN (hypertension)   . IBS (irritable bowel syndrome)    Hx: of  . Osteoarthritis   . Overactive bladder    Hx: of stress incontinence  . Pneumonia 2002   history of  . Shingles   . Varicose veins of lower extremities with other complications 0000000   Past Surgical History:  Procedure Laterality Date  . BACK SURGERY    . CATARACT EXTRACTION Bilateral 08/10/2015,08/15/2015  . COLONOSCOPY     Hx: of  . CTS bilateral    . KNEE ARTHROSCOPY  1988, 1992   bilateral  . LEFT AND RIGHT HEART CATHETERIZATION WITH CORONARY ANGIOGRAM N/A 09/03/2014   Procedure: LEFT AND RIGHT HEART CATHETERIZATION WITH CORONARY ANGIOGRAM;  Surgeon: [name], MD;  Location: [facility] CATH LAB;  Service: Cardiovascular;  Laterality: N/A;  . LUMBAR LAMINECTOMY/DECOMPRESSION MICRODISCECTOMY N/A 08/18/2013   Procedure: Lumbar two-three, lumbar three-four, lumbar four-five decompressive laminectomies;  Surgeon: [name], MD;  Location: [place] NEURO ORS;  Service: Neurosurgery;  Laterality: N/A;  . TONSILLECTOMY    . TOTAL KNEE ARTHROPLASTY Bilateral 2005, 2007     Current Meds  Medication Sig  . acetaminophen (TYLENOL) 500  MG tablet Take 1,000 mg by mouth 3 (three) times daily.  Marland Kitchen amLODipine (NORVASC) 5 MG tablet TAKE ONE TABLET BY MOUTH DAILY  . aspirin 81 MG tablet Take 81 mg by mouth daily.   . B-D UF III MINI PEN NEEDLES 31G X 5 MM MISC USE TO TEST BLOOD SUGAR FOUR TIMES A DAY  . furosemide (LASIX) 40 MG tablet TAKE ONE TABLET BY MOUTH DAILY  . HUMALOG KWIKPEN 100 UNIT/ML  KwikPen INJECT 6 TO 10 UNITS INTO THE SKIN BEFORE EACH MEAL THREE TIMES A DAY  . ibuprofen (ADVIL,MOTRIN) 200 MG tablet Take 200 mg by mouth every 6 (six) hours as needed.  . Lancets (ONETOUCH DELICA PLUS 123XX123) MISC USE TO TEST BLOOD SUGAR FOUR TIMES A DAY AS DIRECTED  . LEVEMIR FLEXTOUCH 100 UNIT/ML Pen INJECT 20 UNITS INTO THE SKIN DAILY AT 10 PM  . Magnesium 200 MG TABS Take 1 tablet (200 mg total) by mouth daily.  . meclizine (ANTIVERT) 25 MG tablet Take 25 mg by mouth 2 (two) times daily.  . metFORMIN (GLUCOPHAGE) 1000 MG tablet TAKE ONE TABLET BY MOUTH TWICE A DAY WITH MEALS  . Multiple Vitamins-Minerals (CENTRUM SILVER PO) Take 1 tablet by mouth daily.   Marland Kitchen MYRBETRIQ 25 MG TB24 tablet 25 mg.  . omeprazole (PRILOSEC) 20 MG capsule TAKE ONE CAPSULE BY MOUTH DAILY  . ONETOUCH VERIO test strip USE TO TEST BLOOD SUGAR FOUR TIMES A DAY AS DIRECTED  . OVER THE COUNTER MEDICATION   . pravastatin (PRAVACHOL) 40 MG tablet TAKE ONE TABLET BY MOUTH EVERY EVENING  . Propylene Glycol (SYSTANE BALANCE) 0.6 % SOLN Apply to eye. 2 drops each eye once daily  . vitamin B-12 (CYANOCOBALAMIN) 1000 MCG tablet Take 1,000 mcg by mouth daily.  . [DISCONTINUED] Naproxen Sodium (ALEVE) 220 MG CAPS Take by mouth as needed.     Allergies:   Ciprofloxacin and Sulfamethoxazole   Social History   Tobacco Use  . Smoking status: Never Smoker  . Smokeless tobacco: Never Used  Substance Use Topics  . Alcohol use: No    Alcohol/week: 0.0 standard drinks  . Drug use: No     Family Hx: The patient's family history includes Heart disease in his mother; Hyperlipidemia in his son; Hypertension in his mother; Other (age of onset: 4) in his mother.  ROS:   Please see the history of present illness.    No Fever, chills  or productive cough; some fatigue and balance problems All other systems reviewed and are negative.   Recent Labs: 08/06/2019: ALT 15; BUN 19; Creatinine, Ser 0.84; Hemoglobin 12.4; Platelets  193.0; Potassium 4.7; Sodium 140   Recent Lipid Panel Lab Results  Component Value Date/Time   CHOL 121 08/06/2019 07:18 AM   TRIG 104.0 08/06/2019 07:18 AM   HDL 49.90 08/06/2019 07:18 AM   CHOLHDL 2 08/06/2019 07:18 AM   LDLCALC 50 08/06/2019 07:18 AM    Wt Readings from Last 3 Encounters:  09/23/19 181 lb (82.1 kg)  09/17/19 183 lb (83 kg)  07/30/19 180 lb (81.6 kg)     Objective:    Vital Signs:  BP 119/65 Comment: 30 mins after first reading.  Pulse (!) 57   Ht 5\' 10"  (1.778 m)   Wt 181 lb (82.1 kg)   BMI 25.97 kg/m    VITAL SIGNS:  reviewed NAD Answers questions appropriately Normal affect Remainder of physical examination not performed (telehealth visit; coronavirus pandemic)  ASSESSMENT & PLAN:    1.  CAD-Pt denies recurrent CP; continue medical therapy with ASA and statin. 2. HTN-BP controlled; continue present meds and follow. 3. Hyperlipidemia-continue statin. 4. Chronic diastolic CHF-by history, pt is euvolemic; continue present dose of diuretic.  COVID-19 Education: The importance of social distancing was discussed today.  Time:   Today, I have spent 18 minutes with the patient with telehealth technology discussing the above problems.     Medication Adjustments/Labs and Tests Ordered: Current medicines are reviewed at length with the patient today.  Concerns regarding medicines are outlined above.   Tests Ordered: No orders of the defined types were placed in this encounter.   Medication Changes: No orders of the defined types were placed in this encounter.   Follow Up:  Either In Person or Virtual Visit in 1 year(s)  Signed, Kirk Ruths, MD  09/23/2019 8:00 AM    [place]

## 2019-09-23 ENCOUNTER — Telehealth (INDEPENDENT_AMBULATORY_CARE_PROVIDER_SITE_OTHER): Payer: Medicare Other | Admitting: Cardiology

## 2019-09-23 VITALS — BP 119/65 | HR 57 | Ht 70.0 in | Wt 181.0 lb

## 2019-09-23 DIAGNOSIS — I10 Essential (primary) hypertension: Secondary | ICD-10-CM

## 2019-09-23 DIAGNOSIS — I2510 Atherosclerotic heart disease of native coronary artery without angina pectoris: Secondary | ICD-10-CM

## 2019-09-23 DIAGNOSIS — I2583 Coronary atherosclerosis due to lipid rich plaque: Secondary | ICD-10-CM | POA: Diagnosis not present

## 2019-09-23 DIAGNOSIS — E7800 Pure hypercholesterolemia, unspecified: Secondary | ICD-10-CM

## 2019-09-23 NOTE — Patient Instructions (Signed)
Medication Instructions:  Continue same medications If you need a refill on your cardiac medications before your next appointment, please call your pharmacy.   Lab work: None ordered   Testing/Procedures: None ordered  Follow-Up: At [facility], you and your health needs are our priority.  As part of our continuing mission to provide you with exceptional heart care, we have created designated Provider Care Teams.  These Care Teams include your primary Cardiologist (physician) and Advanced Practice Providers (APPs -  Physician Assistants and Nurse Practitioners) who all work together to provide you with the care you need, when you need it. . Schedule follow up appointment in 1 year   Call in July to schedule Oct appointment

## 2019-09-25 ENCOUNTER — Encounter: Payer: Self-pay | Admitting: Family Medicine

## 2019-10-09 ENCOUNTER — Other Ambulatory Visit: Payer: Self-pay | Admitting: Internal Medicine

## 2019-10-16 ENCOUNTER — Other Ambulatory Visit: Payer: Self-pay

## 2019-10-16 ENCOUNTER — Encounter: Payer: Self-pay | Admitting: Family Medicine

## 2019-10-16 ENCOUNTER — Telehealth (INDEPENDENT_AMBULATORY_CARE_PROVIDER_SITE_OTHER): Payer: Medicare Other | Admitting: Family Medicine

## 2019-10-16 VITALS — BP 125/67 | HR 65 | Temp 98.2°F | Wt 180.0 lb

## 2019-10-16 DIAGNOSIS — E1142 Type 2 diabetes mellitus with diabetic polyneuropathy: Secondary | ICD-10-CM | POA: Diagnosis not present

## 2019-10-16 DIAGNOSIS — N3281 Overactive bladder: Secondary | ICD-10-CM

## 2019-10-16 DIAGNOSIS — E1159 Type 2 diabetes mellitus with other circulatory complications: Secondary | ICD-10-CM

## 2019-10-16 DIAGNOSIS — I5032 Chronic diastolic (congestive) heart failure: Secondary | ICD-10-CM | POA: Diagnosis not present

## 2019-10-16 DIAGNOSIS — I2583 Coronary atherosclerosis due to lipid rich plaque: Secondary | ICD-10-CM

## 2019-10-16 DIAGNOSIS — I152 Hypertension secondary to endocrine disorders: Secondary | ICD-10-CM

## 2019-10-16 DIAGNOSIS — E785 Hyperlipidemia, unspecified: Secondary | ICD-10-CM

## 2019-10-16 DIAGNOSIS — I2510 Atherosclerotic heart disease of native coronary artery without angina pectoris: Secondary | ICD-10-CM

## 2019-10-16 DIAGNOSIS — Z794 Long term (current) use of insulin: Secondary | ICD-10-CM

## 2019-10-16 DIAGNOSIS — M5030 Other cervical disc degeneration, unspecified cervical region: Secondary | ICD-10-CM

## 2019-10-16 DIAGNOSIS — E1169 Type 2 diabetes mellitus with other specified complication: Secondary | ICD-10-CM

## 2019-10-16 DIAGNOSIS — I10 Essential (primary) hypertension: Secondary | ICD-10-CM

## 2019-10-16 NOTE — Progress Notes (Signed)
Virtual Visit via Telephone Note  I connected with [name] on 10/16/19 at 10:00 AM EST by telephone and verified that I am speaking with the correct person using two identifiers.   I discussed the limitations, risks, security and privacy concerns of performing an evaluation and management service by telephone and the availability of in person appointments. I also discussed with the patient that there may be a patient responsible charge related to this service. The patient expressed understanding and agreed to proceed.  Location patient: home Location provider: work or home office Participants present for the call: patient, provider Patient did not have a visit in the prior 7 days to address this/these issue(s).   History of Present Illness:  Follow up. Reports feels is doing well despite his age. Worst issue is his longstanding widespread OA. Sees several specialist for management. Is using his walker at all times and is careful to prevent falls.  Diabetes - Last [id number] -complications: peripheral neuropathy -managed by endocrinology, Dr. Cruzita Lederer -meds: metformin,Levimir, insulin with meals, asa -eye exam: sees Dr. Idolina Primer; will see Dr. Idolina Primer this November -foot exam: utd  Essential hypertensionw/ diabetes - stable  - sees cardiology for management  CAD, Chronic diastolic congestive heart failure - 2-D echo on 08/26/14 showed EF 60-65% with grade 1 diastolic dysfunction -chronic LE edema -cath 2015 30% LM, 40-50%LAD -nuclear medicine stress test 01/2018, neg for ischemia per cardiology notes -meds: asa, statinstopped 06/2017 for leg complaints and elevated CK, then restarted- managed by his cardiologist, lasix  -cardiologist: Dr. [name] - He says he saw Dr. [name] recently a few weeks ago in 09/2019  OAB (overactive bladder) -meds: myrbetriq -stable, has struggled with this for a long time -Immunologist, Dr. Patsy Baltimore  GERD: -meds:  prilosec  Hyperlipemia - stable  - meds: Pravastatinstopped by cardiology due to leg cramps and elevated CK- then restarted when resolved, he reports has done fine back on it  Anemia: -during hospital stay 2016 and mild in the past on review cardiology records  -no bleedingreported, Hgb12.4 last check -stable, he also is seeing GI for this per Dr. Ethlyn Gallery notes  Chronic neck pain/CTS: -sees Dr. Nelva Bush, [facility], for management -also seeing Dr. Arnoldo Morale and Dr. Assunta Curtis - now seeing Dr. Assunta Curtis, had injections and MRIs -also has responded well to OMM and accupuncture and is doing better currently -meds: tramadol   Observations/Objective: Patient sounds cheerful and well on the phone. I do not appreciate any SOB. Speech and thought processing are grossly intact. Patient reported vitals:  Assessment and Plan:  Hyperlipidemia associated with type 2 diabetes mellitus ([place])  Type 2 diabetes mellitus with diabetic polyneuropathy, with long-term current use of insulin (HCC)  Chronic diastolic congestive heart failure (HCC)  Coronary artery disease due to lipid rich plaque  Hypertension associated with diabetes (HCC)  OAB (overactive bladder)  DDD (degenerative disc disease), cervical  -reviewed medications -discussed safe dosing limits for tylenol for pain - he feels it helps when uses the 1000mg  dose or the tylenol arthritis, advised not going over 3000mg  per day -cont care with specialists -discuss labs and he had not long ago and reports is scheduled to recheck in December with PCP -follow up in 3-4 months advised with PCP or via telemedicine  Follow Up Instructions:  I did not refer this patient for an OV in the next 24 hours for this/these issue(s).  I discussed the assessment and treatment plan with the patient. The patient was provided an opportunity  to ask questions and all were answered. The patient agreed with the plan and demonstrated an understanding of the  instructions.   The patient was advised to call back or seek an in-person evaluation if the symptoms worsen or if the condition fails to improve as anticipated.  I provided 12 minutes of non-face-to-face time during this encounter.   [name], DO

## 2019-10-23 ENCOUNTER — Telehealth: Payer: Self-pay | Admitting: Internal Medicine

## 2019-10-23 MED ORDER — LEVEMIR FLEXTOUCH 100 UNIT/ML ~~LOC~~ SOPN: PEN_INJECTOR | SUBCUTANEOUS | 2 refills | Status: DC

## 2019-10-23 NOTE — Telephone Encounter (Signed)
RX sent

## 2019-10-23 NOTE — Telephone Encounter (Signed)
MEDICATION: LEVEMIR FLEXTOUCH 100 UNIT/ML Pen  PHARMACY:   [facility], [place] [phone] (Phone) [phone] (Fax)    IS THIS A 90 DAY SUPPLY :  Not sure  IS PATIENT OUT OF MEDICATION: No  IF NOT; HOW MUCH IS LEFT: 3 days  LAST APPOINTMENT DATE: @10 /29/2020  NEXT APPOINTMENT DATE:@3 /17/2021  DO WE HAVE YOUR PERMISSION TO LEAVE A DETAILED MESSAGE: Yes-if working  OTHER COMMENTS:    **Let patient know to contact pharmacy at the end of the day to make sure medication is ready. **  ** Please notify patient to allow 48-72 hours to process**  **Encourage patient to contact the pharmacy for refills or they can request refills through [facility]**

## 2019-11-04 ENCOUNTER — Encounter: Payer: Self-pay | Admitting: Family Medicine

## 2019-11-05 ENCOUNTER — Other Ambulatory Visit: Payer: Self-pay | Admitting: Internal Medicine

## 2019-11-11 ENCOUNTER — Other Ambulatory Visit: Payer: Self-pay

## 2019-11-12 ENCOUNTER — Other Ambulatory Visit: Payer: Medicare Other

## 2019-11-12 ENCOUNTER — Other Ambulatory Visit (INDEPENDENT_AMBULATORY_CARE_PROVIDER_SITE_OTHER): Payer: Medicare Other

## 2019-11-12 DIAGNOSIS — E1169 Type 2 diabetes mellitus with other specified complication: Secondary | ICD-10-CM

## 2019-11-12 DIAGNOSIS — D729 Disorder of white blood cells, unspecified: Secondary | ICD-10-CM

## 2019-11-12 DIAGNOSIS — E785 Hyperlipidemia, unspecified: Secondary | ICD-10-CM

## 2019-11-12 LAB — CBC WITH DIFFERENTIAL/PLATELET
Basophils Absolute: 0 10*3/uL (ref 0.0–0.1)
Basophils Relative: 0.3 % (ref 0.0–3.0)
Eosinophils Absolute: 0.1 10*3/uL (ref 0.0–0.7)
Eosinophils Relative: 1.2 % (ref 0.0–5.0)
HCT: 39.8 % (ref 39.0–52.0)
Hemoglobin: 13 g/dL (ref 13.0–17.0)
Lymphocytes Relative: 61.1 % — ABNORMAL HIGH (ref 12.0–46.0)
Lymphs Abs: 5.6 10*3/uL — ABNORMAL HIGH (ref 0.7–4.0)
MCHC: 32.5 g/dL (ref 30.0–36.0)
MCV: 92 fl (ref 78.0–100.0)
Monocytes Absolute: 0.5 10*3/uL (ref 0.1–1.0)
Monocytes Relative: 5.1 % (ref 3.0–12.0)
Neutro Abs: 3 10*3/uL (ref 1.4–7.7)
Neutrophils Relative %: 32.3 % — ABNORMAL LOW (ref 43.0–77.0)
Platelets: 190 10*3/uL (ref 150.0–400.0)
RBC: 4.33 Mil/uL (ref 4.22–5.81)
RDW: 14.2 % (ref 11.5–15.5)
WBC: 9.2 10*3/uL (ref 4.0–10.5)

## 2019-11-13 LAB — PATHOLOGIST SMEAR REVIEW

## 2019-11-17 LAB — HM DIABETES EYE EXAM

## 2019-11-18 ENCOUNTER — Other Ambulatory Visit: Payer: Self-pay | Admitting: *Deleted

## 2019-11-18 DIAGNOSIS — R899 Unspecified abnormal finding in specimens from other organs, systems and tissues: Secondary | ICD-10-CM

## 2019-11-19 ENCOUNTER — Telehealth: Payer: Self-pay | Admitting: Hematology

## 2019-11-19 NOTE — Telephone Encounter (Signed)
Received a new hem referral from Dr. Ethlyn Gallery for abnl lab test. [name] has been cld and scheduled to see Dr. Irene Limbo on 12/15 at 10am. He's been made aware to arrive 15 minutes early.

## 2019-11-24 NOTE — Progress Notes (Signed)
HEMATOLOGY/ONCOLOGY CONSULTATION NOTE  Date of Service: 11/25/2019  Patient Care Team: Caren Macadam, MD as PCP - General (Family Medicine) [name] Denice Bors, MD as PCP - Cardiology (Cardiology) Hillary Bow, MD as Attending Physician (Cardiology) Philemon Kingdom, MD as Consulting Physician (Endocrinology) Idolina Primer Warnell Bureau (Optometry)  CHIEF COMPLAINTS/PURPOSE OF CONSULTATION:  Lymphocytosis  HISTORY OF PRESENTING ILLNESS:   [name] is a wonderful 85 y.o. male who has been referred to [place] by Dr Ethlyn Gallery for evaluation and management of lymphocytosis. Pt is accompanied today by his wife, [name]. The pt reports that he is doing well overall.  The pt reports that he has not felt any different in the last 3-6 months. Pt denies any new lumps or bumps. He does have a large lump on the back of his neck that has been previously evaluated and was determined to be a fatty lipoma. He is a diabetic and has constant numbess in his fingers. Pt is having increased balance issues and is noticing increasing numbness in his feet as well. He also has urinary  incontinence due to an overactive bladder. Pt does see a Dealer at [facility]. He had a LLE blood clot many years ago. He was having very little pain or swelling but it was caught by a physician during a routine inspection.  Pt is not a smoker and does not drink much alcohol. He has not used any steroids recently. He has not any recent infections or injuries. Pt had his annual flu shot in August. He is up to date with his Pneumonia and Shingles vaccines.   Most recent lab results (11/12/2019) of CBC w/diff  is as follows: all values are WNL except for Neutro Rel at 32.3, Lymphs Rel at 61.1, Lymphs Abs at 5.6K.  On review of systems, pt reports balance issues, numbness in fingers/feet and denies fevers, chills, night sweats, unexpected weight loss, new lumps/bumps, abdominal pain and any other symptoms.   On  PMHx the pt reports DVT, Diabetes, Overactive Bladder, CAD, Left and Right Heart Catheterization with Coronary Angiogram, B/L Knee Arthroscopy. On Social Hx the pt reports that he is a non-smoker and does not drink much EtOH.  MEDICAL HISTORY:  Past Medical History:  Diagnosis Date  . Anemia   . Atherosclerotic PVD with intermittent claudication ([place])   . CAD (coronary artery disease)   . Diabetes mellitus    type 2 with peripheral neuropathy  . Diastolic CHF, chronic ([place])   . Diverticulosis   . DVT (deep venous thrombosis) ([place])   . Dyslipidemia   . Edema 08/19/2014  . GERD (gastroesophageal reflux disease)    hx hiatal hernia  . HTN (hypertension)   . IBS (irritable bowel syndrome)    Hx: of  . Osteoarthritis   . Overactive bladder    Hx: of stress incontinence  . Pneumonia 2002   history of  . Shingles   . Varicose veins of lower extremities with other complications 0000000    SURGICAL HISTORY: Past Surgical History:  Procedure Laterality Date  . BACK SURGERY    . CATARACT EXTRACTION Bilateral 08/10/2015,08/15/2015  . COLONOSCOPY     Hx: of  . CTS bilateral    . KNEE ARTHROSCOPY  1988, 1992   bilateral  . LEFT AND RIGHT HEART CATHETERIZATION WITH CORONARY ANGIOGRAM N/A 09/03/2014   Procedure: LEFT AND RIGHT HEART CATHETERIZATION WITH CORONARY ANGIOGRAM;  Surgeon: [name], MD;  Location: [facility] CATH LAB;  Service: Cardiovascular;  Laterality: N/A;  . LUMBAR LAMINECTOMY/DECOMPRESSION MICRODISCECTOMY N/A 08/18/2013   Procedure: Lumbar two-three, lumbar three-four, lumbar four-five decompressive laminectomies;  Surgeon: [name], MD;  Location: [facility]URO ORS;  Service: Neurosurgery;  Laterality: N/A;  . TONSILLECTOMY    . TOTAL KNEE ARTHROPLASTY Bilateral 2005, 2007    SOCIAL HISTORY: Social History   Socioeconomic History  . Marital status: Married    Spouse name: Not on file  . Number of children: 2  . Years of education: Not on file  . Highest  education level: Not on file  Occupational History  . Not on file  Tobacco Use  . Smoking status: Never Smoker  . Smokeless tobacco: Never Used  Substance and Sexual Activity  . Alcohol use: No    Alcohol/week: 0.0 standard drinks  . Drug use: No  . Sexual activity: Not on file  Other Topics Concern  . 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 Difficulty of Paying Living Expenses: Not on file  Food Insecurity:   . Worried About Charity fundraiser in the Last Year: Not on file  . Ran Out of Food in the Last Year: Not on file  Transportation Needs:   . Lack of Transportation (Medical): Not on file  . Lack of Transportation (Non-Medical): Not on file  Physical Activity:   . Days of Exercise per Week: Not on file  . Minutes of Exercise per Session: Not on file  Stress:   . Feeling of Stress : Not on file  Social Connections:   . Frequency of Communication with Friends and Family: Not on file  . Frequency of Social Gatherings with Friends and Family: Not on file  . Attends Religious Services: Not on file  . Active Member of Clubs or Organizations: Not on file  . Attends Archivist Meetings: Not on file  . Marital Status: Not on file  Intimate Partner Violence:   . Fear of Current or Ex-Partner: Not on file  . Emotionally Abused: Not on file  . Physically Abused: Not on file  . Sexually Abused: Not on file    FAMILY HISTORY: Family History  Problem Relation Age of Onset  . Other Mother 82       CABG  . Heart disease Mother   . Hypertension Mother   . Hyperlipidemia Son     ALLERGIES:  is allergic to  ciprofloxacin and sulfamethoxazole.  MEDICATIONS:  Current Outpatient Medications  Medication Sig Dispense Refill  . Acetaminophen (TYLENOL ARTHRITIS PAIN PO) Take 625 mg by mouth 3 (three) times daily.     Marland Kitchen amLODipine (NORVASC) 5 MG tablet TAKE ONE TABLET BY MOUTH DAILY 90 tablet 2  . aspirin 81 MG tablet Take 81 mg by mouth daily.     . B-D UF III MINI PEN NEEDLES 31G X 5 MM MISC USE TO TEST BLOOD SUGAR FOUR TIMES A DAY 200 each 0  . furosemide (LASIX) 40 MG tablet TAKE ONE TABLET BY MOUTH DAILY 90 tablet 1  . HUMALOG  KWIKPEN 100 UNIT/ML KwikPen INJECT 6 TO 10 UNITS INTO THE SKIN BEFORE EACH MEAL THREE TIMES A DAY 15 mL 2  . ibuprofen (ADVIL,MOTRIN) 200 MG tablet Take 200 mg by mouth every 6 (six) hours as needed.    . Insulin Detemir (LEVEMIR FLEXTOUCH) 100 UNIT/ML Pen INJECT 20 UNITS INTO THE SKIN DAILY AT 10 PM 15 mL 2  . Lancets (ONETOUCH DELICA PLUS 123XX123) MISC USE TO TEST BLOOD SUGAR FOUR TIMES A DAY AS DIRECTED 400 each 2  . Magnesium 200 MG TABS Take 1 tablet (200 mg total) by mouth daily. 30 each 11  . meclizine (ANTIVERT) 25 MG tablet Take 25 mg by mouth 2 (two) times daily.    . metFORMIN (GLUCOPHAGE) 1000 MG tablet TAKE ONE TABLET BY MOUTH TWICE A DAY WITH MEALS 180 tablet 0  . Multiple Vitamins-Minerals (CENTRUM SILVER PO) Take 1 tablet by mouth daily.     Marland Kitchen MYRBETRIQ 25 MG TB24 tablet 25 mg.    . omeprazole (PRILOSEC) 20 MG capsule TAKE ONE CAPSULE BY MOUTH DAILY 90 capsule 1  . ONETOUCH VERIO test strip USE TO TEST BLOOD SUGAR FOUR TIMES A DAY AS DIRECTED 400 strip 2  . OVER THE COUNTER MEDICATION     . pravastatin (PRAVACHOL) 40 MG tablet TAKE ONE TABLET BY MOUTH EVERY EVENING 90 tablet 1  . Propylene Glycol (SYSTANE BALANCE) 0.6 % SOLN Apply to eye. 2 drops each eye once daily    . vitamin B-12 (CYANOCOBALAMIN) 1000 MCG tablet Take 1,000 mcg by mouth daily.     No current facility-administered medications for this visit.    REVIEW OF SYSTEMS:    10 Point review  of Systems was done is negative except as noted above.  PHYSICAL EXAMINATION: ECOG PERFORMANCE STATUS: 2 - Symptomatic, <50% confined to bed  . Vitals:   11/25/19 0948  BP: 133/69  Pulse: 74  Resp: 18  Temp: 97.9 F (36.6 C)  SpO2: 98%   Filed Weights   11/25/19 0948  Weight: 186 lb 1.6 oz (84.4 kg)   .Body mass index is 26.7 kg/m.  Exam was given in a chair   GENERAL:alert, in no acute distress and comfortable SKIN: no acute rashes, no significant lesions EYES: conjunctiva are pink and non-injected, sclera anicteric OROPHARYNX: MMM, no exudates, no oropharyngeal erythema or ulceration NECK: supple, no JVD LYMPH:  no palpable lymphadenopathy in the cervical, axillary or inguinal regions LUNGS: clear to auscultation b/l with normal respiratory effort HEART: regular rate & rhythm ABDOMEN:  normoactive bowel sounds , non tender, not distended. Extremity: no pedal edema PSYCH: alert & oriented x 3 with fluent speech NEURO: no focal motor/sensory deficits  LABORATORY DATA:  I have reviewed the data as listed  . CBC Latest Ref Rng & Units 11/25/2019 11/12/2019 08/06/2019  WBC 4.0 - 10.5 K/uL 7.7 9.2 8.2  Hemoglobin 13.0 - 17.0 g/dL 12.4(L) 13.0 12.4(L)  Hematocrit 39.0 - 52.0 % 39.8 39.8 38.0(L)  Platelets 150 - 400 K/uL 204 190.0 193.0    . CMP Latest Ref Rng & Units 11/25/2019 08/06/2019 09/02/2018  Glucose 70 - 99 mg/dL 101(H) 142(H) -  BUN 8 - 23 mg/dL 24(H) 19 -  Creatinine 0.61 - 1.24 mg/dL 1.07 0.84 -  Sodium 135 - 145 mmol/L 142 140 -  Potassium 3.5 - 5.1 mmol/L 5.3(H) 4.7 -  Chloride 98 - 111 mmol/L 105 102 -  CO2 22 - 32 mmol/L 28 29 -  Calcium 8.9 - 10.3 mg/dL 9.8  9.7 -  Total Protein 6.5 - 8.1 g/dL 7.6 7.0 7.0  Total Bilirubin 0.3 - 1.2 mg/dL 0.7 0.7 0.5  Alkaline Phos 38 - 126 U/L 44 37(L) 54  AST 15 - 41 U/L 24 21 25   ALT 0 - 44 U/L 19 15 16    Component     Latest Ref Rng & Units 08/06/2019  Lymphocytes     12.0 - 46.0 % 57.6 (H)  Lymphocyte #      0.7 - 4.0 K/uL 4.7 (H)   Component     Latest Ref Rng & Units 11/12/2019  Lymphocytes     12.0 - 46.0 % 61.1 abnormal cells  noted on smear referred for pathology review. (H)  Lymphocyte #     0.7 - 4.0 K/uL 5.6 (H)    RADIOGRAPHIC STUDIES: I have personally reviewed the radiological images as listed and agreed with the findings in the report. No results found.  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 -Pt's previous anemia has resolved, no thrombocytopenia -Pt has no lymphadenopathy or splenomegaly on examination, no consitutional symptoms -Discussed that lymphocytosis could be due to a reactive process or a primary bone marrow process -Due to changes in blood counts over 3 months, pt appears to have a slow-growing clonal process  -Advised pt that if he has a slow-growing clonal process, like CLL, will most likely monitor at this time -Will consider a BM Bx or PET/CT depending on today's labs -Will get labs today  -Will see back in 1 week via phone   FOLLOW UP: Labs today Phone visit in 1 week  All of the patients questions were answered with apparent satisfaction. The patient knows to call the clinic with any problems, questions or concerns.  I spent 32mins counseling the patient face to face. The total time spent in the appointment was 45 minutes and more than 50% was on counseling and direct patient cares.    Sullivan Lone MD [place] AAHIVMS [facility] Hematology/Oncology Physician [facility]  (Office):       [phone] (Work cell):  [phone] (Fax):           [phone]  11/25/2019 4:29 PM  I, Yevette Edwards, am acting as a scribe for Dr. Sullivan Lone.   .I have reviewed the above documentation for accuracy and completeness, and I agree with the  above. Brunetta Genera MD

## 2019-11-25 ENCOUNTER — Inpatient Hospital Stay: Payer: Medicare Other

## 2019-11-25 ENCOUNTER — Encounter: Payer: Self-pay | Admitting: Family Medicine

## 2019-11-25 ENCOUNTER — Other Ambulatory Visit: Payer: Self-pay

## 2019-11-25 ENCOUNTER — Inpatient Hospital Stay: Payer: Medicare Other | Attending: Hematology | Admitting: Hematology

## 2019-11-25 VITALS — BP 133/69 | HR 74 | Temp 97.9°F | Resp 18 | Ht 70.0 in | Wt 186.1 lb

## 2019-11-25 DIAGNOSIS — N3281 Overactive bladder: Secondary | ICD-10-CM | POA: Diagnosis not present

## 2019-11-25 DIAGNOSIS — Z83438 Family history of other disorder of lipoprotein metabolism and other lipidemia: Secondary | ICD-10-CM | POA: Diagnosis not present

## 2019-11-25 DIAGNOSIS — N39498 Other specified urinary incontinence: Secondary | ICD-10-CM

## 2019-11-25 DIAGNOSIS — M1990 Unspecified osteoarthritis, unspecified site: Secondary | ICD-10-CM | POA: Diagnosis not present

## 2019-11-25 DIAGNOSIS — Z86718 Personal history of other venous thrombosis and embolism: Secondary | ICD-10-CM | POA: Diagnosis not present

## 2019-11-25 DIAGNOSIS — E785 Hyperlipidemia, unspecified: Secondary | ICD-10-CM

## 2019-11-25 DIAGNOSIS — Z8249 Family history of ischemic heart disease and other diseases of the circulatory system: Secondary | ICD-10-CM

## 2019-11-25 DIAGNOSIS — K219 Gastro-esophageal reflux disease without esophagitis: Secondary | ICD-10-CM | POA: Diagnosis not present

## 2019-11-25 DIAGNOSIS — Z794 Long term (current) use of insulin: Secondary | ICD-10-CM

## 2019-11-25 DIAGNOSIS — D72820 Lymphocytosis (symptomatic): Secondary | ICD-10-CM | POA: Diagnosis present

## 2019-11-25 DIAGNOSIS — I5032 Chronic diastolic (congestive) heart failure: Secondary | ICD-10-CM

## 2019-11-25 DIAGNOSIS — D479 Neoplasm of uncertain behavior of lymphoid, hematopoietic and related tissue, unspecified: Secondary | ICD-10-CM

## 2019-11-25 DIAGNOSIS — Z882 Allergy status to sulfonamides status: Secondary | ICD-10-CM | POA: Diagnosis not present

## 2019-11-25 DIAGNOSIS — R200 Anesthesia of skin: Secondary | ICD-10-CM

## 2019-11-25 DIAGNOSIS — D171 Benign lipomatous neoplasm of skin and subcutaneous tissue of trunk: Secondary | ICD-10-CM

## 2019-11-25 DIAGNOSIS — I110 Hypertensive heart disease with heart failure: Secondary | ICD-10-CM

## 2019-11-25 DIAGNOSIS — Z881 Allergy status to other antibiotic agents status: Secondary | ICD-10-CM

## 2019-11-25 DIAGNOSIS — Z79899 Other long term (current) drug therapy: Secondary | ICD-10-CM | POA: Diagnosis not present

## 2019-11-25 DIAGNOSIS — E1151 Type 2 diabetes mellitus with diabetic peripheral angiopathy without gangrene: Secondary | ICD-10-CM

## 2019-11-25 DIAGNOSIS — I2510 Atherosclerotic heart disease of native coronary artery without angina pectoris: Secondary | ICD-10-CM | POA: Diagnosis not present

## 2019-11-25 LAB — HEPATITIS B CORE ANTIBODY, TOTAL: Hep B Core Total Ab: NONREACTIVE

## 2019-11-25 LAB — CBC WITH DIFFERENTIAL/PLATELET
Abs Immature Granulocytes: 0.04 10*3/uL (ref 0.00–0.07)
Basophils Absolute: 0 10*3/uL (ref 0.0–0.1)
Basophils Relative: 0 %
Eosinophils Absolute: 0.1 10*3/uL (ref 0.0–0.5)
Eosinophils Relative: 1 %
HCT: 39.8 % (ref 39.0–52.0)
Hemoglobin: 12.4 g/dL — ABNORMAL LOW (ref 13.0–17.0)
Immature Granulocytes: 1 %
Lymphocytes Relative: 45 %
Lymphs Abs: 3.5 10*3/uL (ref 0.7–4.0)
MCH: 30 pg (ref 26.0–34.0)
MCHC: 31.2 g/dL (ref 30.0–36.0)
MCV: 96.4 fL (ref 80.0–100.0)
Monocytes Absolute: 0.4 10*3/uL (ref 0.1–1.0)
Monocytes Relative: 5 %
Neutro Abs: 3.7 10*3/uL (ref 1.7–7.7)
Neutrophils Relative %: 48 %
Platelets: 204 10*3/uL (ref 150–400)
RBC: 4.13 MIL/uL — ABNORMAL LOW (ref 4.22–5.81)
RDW: 13.5 % (ref 11.5–15.5)
WBC: 7.7 10*3/uL (ref 4.0–10.5)
nRBC: 0 % (ref 0.0–0.2)

## 2019-11-25 LAB — CMP (CANCER CENTER ONLY)
ALT: 19 U/L (ref 0–44)
AST: 24 U/L (ref 15–41)
Albumin: 4.4 g/dL (ref 3.5–5.0)
Alkaline Phosphatase: 44 U/L (ref 38–126)
Anion gap: 9 (ref 5–15)
BUN: 24 mg/dL — ABNORMAL HIGH (ref 8–23)
CO2: 28 mmol/L (ref 22–32)
Calcium: 9.8 mg/dL (ref 8.9–10.3)
Chloride: 105 mmol/L (ref 98–111)
Creatinine: 1.07 mg/dL (ref 0.61–1.24)
GFR, Est AFR Am: >60 mL/min (ref >60)
GFR, Estimated: >60 mL/min (ref >60)
Glucose, Bld: 101 mg/dL — ABNORMAL HIGH (ref 70–99)
Potassium: 5.3 mmol/L — ABNORMAL HIGH (ref 3.5–5.1)
Sodium: 142 mmol/L (ref 135–145)
Total Bilirubin: 0.7 mg/dL (ref 0.3–1.2)
Total Protein: 7.6 g/dL (ref 6.5–8.1)

## 2019-11-25 LAB — LACTATE DEHYDROGENASE: LDH: 160 U/L (ref 98–192)

## 2019-11-25 LAB — HEPATITIS B SURFACE ANTIGEN: Hepatitis B Surface Ag: NONREACTIVE

## 2019-11-25 LAB — HEPATITIS C ANTIBODY: HCV Ab: NONREACTIVE

## 2019-11-25 LAB — SURGICAL PATHOLOGY

## 2019-11-25 NOTE — Patient Instructions (Signed)
Thank you for choosing [facility] to provide your oncology and hematology care.   Should you have questions after your visit to the [facility] ([facility]), please contact this office at [phone] between 8:30 AM and 4:30 PM.  Voice mails left after 4:00 PM may not be returned until the following business day.  Calls received after 4:30 PM will be answered by an off-site Nurse Triage Line.    Prescription Refills:  Please have your pharmacy contact us directly for most prescription requests.  Contact the office directly for refills of narcotics (pain medications). Allow 48-72 hours for refills.  Appointments: Please contact the [facility] scheduling department [phone] for questions regarding [facility] appointment scheduling.  Contact the schedulers with any scheduling changes so that your appointment can be rescheduled in a timely manner.   Central Scheduling for [place] [phone] - Call to schedule procedures such as PET scans, CT scans, MRI, Ultrasound, etc.  To afford each patient quality time with our providers, please arrive 30 minutes before your scheduled appointment time.  If you arrive late for your appointment, you may be asked to reschedule.  We strive to give you quality time with our providers, and arriving late affects you and other patients whose appointments are after yours. If you are a no show for multiple scheduled visits, you may be dismissed from the clinic at the providers discretion.     Resources: [facility] Social Workers [phone] for additional information on assistance programs --Anne Cunningham/Abigail Elmore  [place] DSS  [phone]: Information regarding food stamps, Medicaid, and utility assistance [employer] [phone]    [employer]'s shared-ride transportation service for eligible riders who have a disability that prevents them from riding the fixed route bus.   Medicare Rights Center 800-333-4114 Helps people with  Medicare understand their rights and benefits, navigate the Medicare system, and secure the quality healthcare they deserve [employer] 800-227-2345 Assists patients locate various types of support and financial assistance Cancer Care: 1-800-813-HOPE (4673) Provides financial assistance, online support groups, medication/co-pay assistance.   Transportation Assistance for appointments at [facility]: Transportation Coordinator [phone]  Again, thank you for choosing [facility] for your care.       

## 2019-11-26 ENCOUNTER — Telehealth: Payer: Self-pay | Admitting: Hematology

## 2019-11-26 NOTE — Telephone Encounter (Signed)
Scheduled appt per 12/15 los.  Spoke with pt and his spouse.  They are both aware of the phone visit.

## 2019-11-27 LAB — FLOW CYTOMETRY

## 2019-12-01 ENCOUNTER — Other Ambulatory Visit: Payer: Self-pay | Admitting: Internal Medicine

## 2019-12-02 ENCOUNTER — Telehealth: Payer: Self-pay | Admitting: *Deleted

## 2019-12-02 ENCOUNTER — Inpatient Hospital Stay (HOSPITAL_BASED_OUTPATIENT_CLINIC_OR_DEPARTMENT_OTHER): Payer: Medicare Other | Admitting: Hematology

## 2019-12-02 ENCOUNTER — Other Ambulatory Visit: Payer: Self-pay | Admitting: Family Medicine

## 2019-12-02 ENCOUNTER — Telehealth: Payer: Self-pay | Admitting: Hematology

## 2019-12-02 DIAGNOSIS — D72820 Lymphocytosis (symptomatic): Secondary | ICD-10-CM

## 2019-12-02 DIAGNOSIS — C9110 Chronic lymphocytic leukemia of B-cell type not having achieved remission: Secondary | ICD-10-CM | POA: Diagnosis not present

## 2019-12-02 MED ORDER — FUROSEMIDE 40 MG PO TABS: 40.0000 mg | ORAL_TABLET | Freq: Every day | ORAL | 1 refills | Status: DC

## 2019-12-02 MED ORDER — AMLODIPINE BESYLATE 5 MG PO TABS: 5.0000 mg | ORAL_TABLET | Freq: Every day | ORAL | 1 refills | Status: DC

## 2019-12-02 NOTE — Telephone Encounter (Signed)
Scheduled appts per 12/22 los.  Spoke with pt and he is aware of the appt date and time.

## 2019-12-02 NOTE — Telephone Encounter (Signed)
done

## 2019-12-02 NOTE — Telephone Encounter (Signed)
[name] faxed a refill request for Amlodipine besylate 5mg -take 1 tablet by mouth daily-#90 and Furosemide 40mg -take 1 tablet by mouth daily-#90.  Message sent to Dr Ethlyn Gallery as last refills were given by Dr [name].

## 2019-12-02 NOTE — Progress Notes (Signed)
HEMATOLOGY/ONCOLOGY CLINIC NOTE  Date of Service: 12/02/2019  Patient Care Team: Caren Macadam, MD as PCP - General (Family Medicine) [name] Denice Bors, MD as PCP - Cardiology (Cardiology) Hillary Bow, MD as Attending Physician (Cardiology) Philemon Kingdom, MD as Consulting Physician (Endocrinology) Idolina Primer Warnell Bureau (Optometry)  CHIEF COMPLAINTS/PURPOSE OF CONSULTATION:  Lymphocytosis  HISTORY OF PRESENTING ILLNESS:  RONDALE [name] is a wonderful 85 y.o. male who has been referred to [place] by Dr Ethlyn Gallery for evaluation and management of lymphocytosis. Pt is accompanied today by his wife, Mrs. Poyser. The pt reports that he is doing well overall.  The pt reports that he has not felt any different in the last 3-6 months. Pt denies any new lumps or bumps. He does have a large lump on the back of his neck that has been previously evaluated and was determined to be a fatty lipoma. He is a diabetic and has constant numbess in his fingers. Pt is having increased balance issues and is noticing increasing numbness in his feet as well. He also has urinary  incontinence due to an overactive bladder. Pt does see a Dealer at [facility]. He had a LLE blood clot many years ago. He was having very little pain or swelling but it was caught by a physician during a routine inspection.  Pt is not a smoker and does not drink much alcohol. He has not used any steroids recently. He has not any recent infections or injuries. Pt had his annual flu shot in August. He is up to date with his Pneumonia and Shingles vaccines.   Most recent lab results (11/12/2019) of CBC w/diff  is as follows: all values are WNL except for Neutro Rel at 32.3, Lymphs Rel at 61.1, Lymphs Abs at 5.6K.  On review of systems, pt reports balance issues, numbness in fingers/feet and denies fevers, chills, night sweats, unexpected weight loss, new lumps/bumps, abdominal pain and any other symptoms.   On PMHx  the pt reports DVT, Diabetes, Overactive Bladder, CAD, Left and Right Heart Catheterization with Coronary Angiogram, B/L Knee Arthroscopy. On Social Hx the pt reports that he is a non-smoker and does not drink much alcohol.  INTERVAL HISTORY:   I connected with  Colbie Odoms Mancebo on 12/02/19 by telephone and verified that I am speaking with the correct person using two identifiers.   I discussed the limitations of evaluation and management by telemedicine. The patient expressed understanding and agreed to proceed.  Other persons participating in the visit and their role in the encounter:     -Yevette Edwards, Medical Scribe    -Pt's wife, Mrs. Meade Maw  Patient's location: Home Provider's location: [name] is a wonderful 85 y.o. male who is here for evaluation and management of lymphocytosis. The patient's last visit with [place] was on 11/25/2019. The pt reports that he is doing well overall.  The pt reports that he has been well and has had no new concerns in the interim.   Of note since the patient's last visit, pt has had Flow Pathology Report ([id number]) completed on 11/25/2019 with results revealing "Monoclonal B-cell population identified"  Pt has had FISH Panel completed on 11/25/2019 with results revealing "Mono-allelic deletion of [id number] (13q14.3) locus is detected."  Lab results (11/25/19) of CBC w/diff and CMP is as follows: all values are WNL except for RBC at 4.13, Hgb at 12.4, Potassium at 5.3, Glucose at 101, BUN  at 24. 11/25/2019 LDH at 160 11/25/2019 HCV Ab is "Non Reactive" 11/25/2019 Hepatitis B Surface Ag is "Non Reactive" 11/25/2019 Hep B Core Total Ab is "Non Reactive"  On review of systems, pt denies any other symptoms.   MEDICAL HISTORY:  Past Medical History:  Diagnosis Date  . Anemia   . Atherosclerotic PVD with intermittent claudication ([place])   . CAD (coronary artery disease)   . Diabetes mellitus    type 2  with peripheral neuropathy  . Diastolic CHF, chronic ([name])   . Diverticulosis   . DVT (deep venous thrombosis) ([place])   . Dyslipidemia   . Edema 08/19/2014  . GERD (gastroesophageal reflux disease)    hx hiatal hernia  . HTN (hypertension)   . IBS (irritable bowel syndrome)    Hx: of  . Osteoarthritis   . Overactive bladder    Hx: of stress incontinence  . Pneumonia 2002   history of  . Shingles   . Varicose veins of lower extremities with other complications 0000000    SURGICAL HISTORY: Past Surgical History:  Procedure Laterality Date  . BACK SURGERY    . CATARACT EXTRACTION Bilateral 08/10/2015,08/15/2015  . COLONOSCOPY     Hx: of  . CTS bilateral    . KNEE ARTHROSCOPY  1988, 1992   bilateral  . LEFT AND RIGHT HEART CATHETERIZATION WITH CORONARY ANGIOGRAM N/A 09/03/2014   Procedure: LEFT AND RIGHT HEART CATHETERIZATION WITH CORONARY ANGIOGRAM;  Surgeon: [name], MD;  Location: [facility] CATH LAB;  Service: Cardiovascular;  Laterality: N/A;  . LUMBAR LAMINECTOMY/DECOMPRESSION MICRODISCECTOMY N/A 08/18/2013   Procedure: Lumbar two-three, lumbar three-four, lumbar four-five decompressive laminectomies;  Surgeon: [name], MD;  Location: [facility] NEURO ORS;  Service: Neurosurgery;  Laterality: N/A;  . TONSILLECTOMY    . TOTAL KNEE ARTHROPLASTY Bilateral 2005, 2007    SOCIAL HISTORY: Social History   Socioeconomic History  . Marital status: Married    Spouse name: Not on file  . Number of children: 2  . Years of education: Not on file  . Highest education level: Not on file  Occupational History  . Not on file  Tobacco Use  . Smoking status: Never Smoker  . Smokeless tobacco: Never Used  Substance and Sexual Activity  . Alcohol use: No    Alcohol/week: 0.0 standard drinks  . Drug use: No  . Sexual activity: Not on file  Other Topics Concern  . 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 Difficulty of Paying Living Expenses: Not on file  Food Insecurity:   . Worried About Charity fundraiser in the Last Year: Not on file  . Ran Out of Food in the Last Year: Not on file  Transportation Needs:   . Lack of Transportation (Medical): Not on file  . Lack of Transportation (Non-Medical): Not on file  Physical Activity:   . Days of Exercise per Week: Not on file  . Minutes of Exercise per Session: Not on file  Stress:   . Feeling of Stress :  Not on file  Social Connections:   . Frequency of Communication with Friends and Family: Not on file  . Frequency of Social Gatherings with Friends and Family: Not on file  . Attends Religious Services: Not on file  . Active Member of Clubs or Organizations: Not on file  . Attends Archivist Meetings: Not on file  . Marital Status: Not on file  Intimate Partner Violence:   . Fear of Current or Ex-Partner: Not on file  . Emotionally Abused: Not on file  . Physically Abused: Not on file  . Sexually Abused: Not on file    FAMILY HISTORY: Family History  Problem Relation Age of Onset  . Other Mother 54       CABG  . Heart disease Mother   . Hypertension Mother   . Hyperlipidemia Son     ALLERGIES:  is allergic to ciprofloxacin and sulfamethoxazole.  MEDICATIONS:  Current Outpatient Medications  Medication Sig Dispense Refill  . Acetaminophen (TYLENOL ARTHRITIS PAIN PO) Take 625 mg by mouth 3 (three) times daily.     Marland Kitchen amLODipine (NORVASC) 5 MG tablet Take 1 tablet (5 mg total) by mouth daily. 90 tablet 1  . aspirin 81 MG tablet Take 81 mg by mouth daily.     . B-D UF III MINI PEN NEEDLES 31G X 5 MM MISC USE TO TEST BLOOD SUGAR  FOUR TIMES A DAY 200 each 11  . furosemide (LASIX) 40 MG tablet Take 1 tablet (40 mg total) by mouth daily. 90 tablet 1  . HUMALOG KWIKPEN 100 UNIT/ML KwikPen INJECT 6 TO 10 UNITS INTO THE SKIN BEFORE EACH MEAL THREE TIMES A DAY 15 mL 2  . ibuprofen (ADVIL,MOTRIN) 200 MG tablet Take 200 mg by mouth every 6 (six) hours as needed.    . Insulin Detemir (LEVEMIR FLEXTOUCH) 100 UNIT/ML Pen INJECT 20 UNITS INTO THE SKIN DAILY AT 10 PM 15 mL 2  . Lancets (ONETOUCH DELICA PLUS 123XX123) MISC USE TO TEST BLOOD SUGAR FOUR TIMES A DAY AS DIRECTED 400 each 2  . Magnesium 200 MG TABS Take 1 tablet (200 mg total) by mouth daily. 30 each 11  . meclizine (ANTIVERT) 25 MG tablet Take 25 mg by mouth 2 (two) times daily.    . metFORMIN (GLUCOPHAGE) 1000 MG tablet TAKE ONE TABLET BY MOUTH TWICE A DAY WITH MEALS 180 tablet 0  . Multiple Vitamins-Minerals (CENTRUM SILVER PO) Take 1 tablet by mouth daily.     Marland Kitchen MYRBETRIQ 25 MG TB24 tablet 25 mg.    . omeprazole (PRILOSEC) 20 MG capsule TAKE ONE CAPSULE BY MOUTH DAILY 90 capsule 1  . ONETOUCH VERIO test strip USE TO TEST BLOOD SUGAR FOUR TIMES A DAY AS DIRECTED 400 strip 2  . OVER THE COUNTER MEDICATION     . pravastatin (PRAVACHOL) 40 MG tablet TAKE ONE TABLET BY MOUTH EVERY EVENING 90 tablet 1  . Propylene Glycol (SYSTANE BALANCE) 0.6 % SOLN Apply to eye. 2 drops each eye once daily    . vitamin B-12 (CYANOCOBALAMIN) 1000 MCG tablet Take 1,000 mcg by mouth daily.     No current facility-administered medications for this visit.    REVIEW OF SYSTEMS:   A 10+ POINT REVIEW OF SYSTEMS WAS OBTAINED including neurology, dermatology, psychiatry, cardiac, respiratory, lymph, extremities, GI, GU, Musculoskeletal, constitutional, breasts, reproductive, HEENT.  All pertinent positives are noted in the HPI.  All others are negative.   PHYSICAL EXAMINATION: ECOG PERFORMANCE STATUS: 2 - Symptomatic, <50%  confined to bed  . There were no vitals filed for this visit. There  were no vitals filed for this visit. .There is no height or weight on file to calculate BMI.  Telehealth visit   LABORATORY DATA:  I have reviewed the data as listed  . CBC Latest Ref Rng & Units 11/25/2019 11/12/2019 08/06/2019  WBC 4.0 - 10.5 K/uL 7.7 9.2 8.2  Hemoglobin 13.0 - 17.0 g/dL 12.4(L) 13.0 12.4(L)  Hematocrit 39.0 - 52.0 % 39.8 39.8 38.0(L)  Platelets 150 - 400 K/uL 204 190.0 193.0    . CMP Latest Ref Rng & Units 11/25/2019 08/06/2019 09/02/2018  Glucose 70 - 99 mg/dL 101(H) 142(H) -  BUN 8 - 23 mg/dL 24(H) 19 -  Creatinine 0.61 - 1.24 mg/dL 1.07 0.84 -  Sodium 135 - 145 mmol/L 142 140 -  Potassium 3.5 - 5.1 mmol/L 5.3(H) 4.7 -  Chloride 98 - 111 mmol/L 105 102 -  CO2 22 - 32 mmol/L 28 29 -  Calcium 8.9 - 10.3 mg/dL 9.8 9.7 -  Total Protein 6.5 - 8.1 g/dL 7.6 7.0 7.0  Total Bilirubin 0.3 - 1.2 mg/dL 0.7 0.7 0.5  Alkaline Phos 38 - 126 U/L 44 37(L) 54  AST 15 - 41 U/L 24 21 25   ALT 0 - 44 U/L 19 15 16    11/25/2019 Flow Pathology Report ([id number]):   Component     Latest Ref Rng & Units 08/06/2019  Lymphocytes     12.0 - 46.0 % 57.6 (H)  Lymphocyte #     0.7 - 4.0 K/uL 4.7 (H)   Component     Latest Ref Rng & Units 11/12/2019  Lymphocytes     12.0 - 46.0 % 61.1 abnormal cells  noted on smear referred for pathology review. (H)  Lymphocyte #     0.7 - 4.0 K/uL 5.6 (H)    RADIOGRAPHIC STUDIES: I have personally reviewed the radiological images as listed and agreed with the findings in the report. No results found.  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 -Discussed12/15/2020 LDH at 160 -Discussed12/15/2020 HCV Ab is "Non Reactive" -Discussed12/15/2020 Hepatitis B Surface Ag is "Non Reactive" -Discussed12/15/2020 Hep B Core Total Ab is "Non  Reactive" -Discussed12/15/2020 Flow Pathology Report ([id number]) which revealed "Monoclonal B-cell population identified" -Discussed 11/25/2019 FISH Panel which revealed "Mono-allelic deletion of [id number] (13q14.3) locus is detected." -Pt has Monoclonal B-Cell Lymphocytosis based on lower lymphocyte counts and lack of lymphadenopathy -No indication for more aggressive testing at this time (ex. BM Bx or PET/CT) -Will watch with labs and clinic examination -Discussed the increased risk of non-Melanoma skin cancers  -Recommend pt f/u with Dermatologist for skin screenings  -Will see back in 6 months with labs  FOLLOW UP: RTC with Dr Irene Limbo with labs in 6 months  The total time spent in the appt was 25 minutes and more than 50% was on counseling and direct patient cares.  All of the patient's questions were answered with apparent satisfaction. The patient knows to call the clinic with any problems, questions or concerns.    Sullivan Lone MD [place] AAHIVMS [facility] Hematology/Oncology Physician [facility]  (Office):       [phone] (Work cell):  [phone] (Fax):  [phone]  12/02/2019 3:13 PM  I, Yevette Edwards, am acting as a scribe for Dr. Sullivan Lone.   .I have reviewed the above documentation for accuracy and completeness, and I agree with the above. Brunetta Genera MD

## 2019-12-17 LAB — FISH,CLL PROGNOSTIC PANEL

## 2019-12-25 ENCOUNTER — Other Ambulatory Visit: Payer: Self-pay | Admitting: Adult Health

## 2019-12-26 ENCOUNTER — Other Ambulatory Visit: Payer: Self-pay | Admitting: Adult Health

## 2019-12-27 ENCOUNTER — Ambulatory Visit: Payer: Medicare Other | Attending: Internal Medicine

## 2019-12-27 DIAGNOSIS — Z23 Encounter for immunization: Secondary | ICD-10-CM | POA: Insufficient documentation

## 2019-12-27 NOTE — Progress Notes (Signed)
   Covid-19 Vaccination Clinic  Name:  [name]    MRN: [id number] DOB: Oct 30, 1933  12/27/2019  [name] was observed post Covid-19 immunization for 15 minutes without incidence. He was provided with Vaccine Information Sheet and instruction to access the V-Safe system.   [name] was instructed to call 911 with any severe reactions post vaccine: Marland Kitchen Difficulty breathing  . Swelling of your face and throat  . A fast heartbeat  . A bad rash all over your body  . Dizziness and weakness    Immunizations Administered    Name Date Dose VIS Date Route   Pfizer COVID-19 Vaccine 12/27/2019 12:01 PM 0.3 mL 11/21/2019 Intramuscular   Manufacturer: [employer], [employer]   Lot: [id number]   [place]: [id number]

## 2020-01-17 ENCOUNTER — Ambulatory Visit: Payer: Medicare PPO | Attending: Internal Medicine

## 2020-01-17 DIAGNOSIS — Z23 Encounter for immunization: Secondary | ICD-10-CM

## 2020-01-17 NOTE — Progress Notes (Signed)
   Covid-19 Vaccination Clinic  Name:  [name]    MRN: [id number] DOB: 1933-06-18  01/17/2020  [name] was observed post Covid-19 immunization for 15 minutes without incidence. He was provided with Vaccine Information Sheet and instruction to access the V-Safe system.   [name] was instructed to call 911 with any severe reactions post vaccine: Marland Kitchen Difficulty breathing  . Swelling of your face and throat  . A fast heartbeat  . A bad rash all over your body  . Dizziness and weakness    Immunizations Administered    Name Date Dose VIS Date Route   Pfizer COVID-19 Vaccine 01/17/2020 12:31 PM 0.3 mL 11/21/2019 Intramuscular   Manufacturer: [place]   Lot: [id number]   [place]: [id number]

## 2020-01-26 ENCOUNTER — Other Ambulatory Visit: Payer: Self-pay

## 2020-01-26 ENCOUNTER — Other Ambulatory Visit: Payer: Self-pay | Admitting: Internal Medicine

## 2020-01-26 MED ORDER — ACCU-CHEK AVIVA PLUS VI STRP: ORAL_STRIP | 12 refills | Status: DC

## 2020-01-26 MED ORDER — ACCU-CHEK GUIDE VI STRP: ORAL_STRIP | 12 refills | Status: DC

## 2020-01-26 MED ORDER — ACCU-CHEK GUIDE W/DEVICE KIT: 1.0000 | PACK | 0 refills | Status: AC

## 2020-01-26 MED ORDER — ACCU-CHEK AVIVA CONNECT W/DEVICE KIT: 1.0000 | PACK | 0 refills | Status: DC

## 2020-01-26 MED ORDER — ACCU-CHEK MULTICLIX LANCETS MISC: 12 refills | Status: DC

## 2020-01-27 ENCOUNTER — Telehealth: Payer: Self-pay | Admitting: Internal Medicine

## 2020-01-27 MED ORDER — ACCU-CHEK MULTICLIX LANCETS MISC: 12 refills | Status: DC

## 2020-01-27 NOTE — Telephone Encounter (Signed)
RX sent

## 2020-01-27 NOTE — Telephone Encounter (Signed)
Patients wife stating the patient's insurance company sent them the Accu-chek test strips, but they do not have lancets with it and are requesting for those. Ph# [phone]  Pharmacy:  [facility], [place] Phone:  [phone]  Fax:  [phone]

## 2020-01-28 ENCOUNTER — Other Ambulatory Visit: Payer: Self-pay | Admitting: Internal Medicine

## 2020-01-28 NOTE — Telephone Encounter (Signed)
RX has been sent.

## 2020-01-30 ENCOUNTER — Other Ambulatory Visit: Payer: Self-pay | Admitting: Internal Medicine

## 2020-01-30 ENCOUNTER — Other Ambulatory Visit: Payer: Self-pay | Admitting: Family Medicine

## 2020-02-09 ENCOUNTER — Ambulatory Visit: Payer: Medicare Other | Admitting: Family Medicine

## 2020-02-13 ENCOUNTER — Other Ambulatory Visit: Payer: Self-pay

## 2020-02-16 ENCOUNTER — Encounter: Payer: Self-pay | Admitting: Family Medicine

## 2020-02-16 ENCOUNTER — Ambulatory Visit (INDEPENDENT_AMBULATORY_CARE_PROVIDER_SITE_OTHER): Payer: Medicare PPO | Admitting: Family Medicine

## 2020-02-16 ENCOUNTER — Other Ambulatory Visit: Payer: Self-pay

## 2020-02-16 VITALS — BP 120/60 | HR 74 | Temp 97.3°F | Ht 70.0 in | Wt 186.0 lb

## 2020-02-16 DIAGNOSIS — M1990 Unspecified osteoarthritis, unspecified site: Secondary | ICD-10-CM | POA: Diagnosis not present

## 2020-02-16 DIAGNOSIS — I10 Essential (primary) hypertension: Secondary | ICD-10-CM | POA: Diagnosis not present

## 2020-02-16 DIAGNOSIS — E875 Hyperkalemia: Secondary | ICD-10-CM | POA: Diagnosis not present

## 2020-02-16 DIAGNOSIS — R32 Unspecified urinary incontinence: Secondary | ICD-10-CM | POA: Diagnosis not present

## 2020-02-16 DIAGNOSIS — R443 Hallucinations, unspecified: Secondary | ICD-10-CM

## 2020-02-16 DIAGNOSIS — G629 Polyneuropathy, unspecified: Secondary | ICD-10-CM

## 2020-02-16 DIAGNOSIS — E538 Deficiency of other specified B group vitamins: Secondary | ICD-10-CM | POA: Diagnosis not present

## 2020-02-16 DIAGNOSIS — E1142 Type 2 diabetes mellitus with diabetic polyneuropathy: Secondary | ICD-10-CM

## 2020-02-16 DIAGNOSIS — R413 Other amnesia: Secondary | ICD-10-CM

## 2020-02-16 DIAGNOSIS — G5603 Carpal tunnel syndrome, bilateral upper limbs: Secondary | ICD-10-CM | POA: Diagnosis not present

## 2020-02-16 DIAGNOSIS — E1159 Type 2 diabetes mellitus with other circulatory complications: Secondary | ICD-10-CM | POA: Diagnosis not present

## 2020-02-16 DIAGNOSIS — F0390 Unspecified dementia without behavioral disturbance: Secondary | ICD-10-CM

## 2020-02-16 DIAGNOSIS — D649 Anemia, unspecified: Secondary | ICD-10-CM | POA: Diagnosis not present

## 2020-02-16 LAB — COMPREHENSIVE METABOLIC PANEL
ALT: 15 U/L (ref 0–53)
AST: 21 U/L (ref 0–37)
Albumin: 4.3 g/dL (ref 3.5–5.2)
Alkaline Phosphatase: 40 U/L (ref 39–117)
BUN: 23 mg/dL (ref 6–23)
CO2: 28 mEq/L (ref 19–32)
Calcium: 9.5 mg/dL (ref 8.4–10.5)
Chloride: 102 mEq/L (ref 96–112)
Creatinine, Ser: 1.03 mg/dL (ref 0.40–1.50)
GFR: 68.74 mL/min (ref >60.00)
Glucose, Bld: 171 mg/dL — ABNORMAL HIGH (ref 70–99)
Potassium: 4.8 mEq/L (ref 3.5–5.1)
Sodium: 137 mEq/L (ref 135–145)
Total Bilirubin: 0.7 mg/dL (ref 0.2–1.2)
Total Protein: 6.9 g/dL (ref 6.0–8.3)

## 2020-02-16 LAB — CBC WITH DIFFERENTIAL/PLATELET
Basophils Absolute: 0 10*3/uL (ref 0.0–0.1)
Basophils Relative: 0.4 % (ref 0.0–3.0)
Eosinophils Absolute: 0 10*3/uL (ref 0.0–0.7)
Eosinophils Relative: 0.6 % (ref 0.0–5.0)
HCT: 36.2 % — ABNORMAL LOW (ref 39.0–52.0)
Hemoglobin: 11.9 g/dL — ABNORMAL LOW (ref 13.0–17.0)
Lymphocytes Relative: 42.6 % (ref 12.0–46.0)
Lymphs Abs: 2.9 10*3/uL (ref 0.7–4.0)
MCHC: 32.8 g/dL (ref 30.0–36.0)
MCV: 90.2 fl (ref 78.0–100.0)
Monocytes Absolute: 0.4 10*3/uL (ref 0.1–1.0)
Monocytes Relative: 5.9 % (ref 3.0–12.0)
Neutro Abs: 3.5 10*3/uL (ref 1.4–7.7)
Neutrophils Relative %: 50.5 % (ref 43.0–77.0)
Platelets: 187 10*3/uL (ref 150.0–400.0)
RBC: 4.01 Mil/uL — ABNORMAL LOW (ref 4.22–5.81)
RDW: 14.8 % (ref 11.5–15.5)
WBC: 6.9 10*3/uL (ref 4.0–10.5)

## 2020-02-16 LAB — IBC + FERRITIN
Ferritin: 11.7 ng/mL — ABNORMAL LOW (ref 22.0–322.0)
Iron: 79 ug/dL (ref 42–165)
Saturation Ratios: 18.8 % — ABNORMAL LOW (ref 20.0–50.0)
Transferrin: 300 mg/dL (ref 212.0–360.0)

## 2020-02-16 LAB — SEDIMENTATION RATE: Sed Rate: 13 mm/hr (ref 0–20)

## 2020-02-16 LAB — VITAMIN B12: Vitamin B-12: >1500 pg/mL — ABNORMAL HIGH (ref 211–911)

## 2020-02-16 LAB — TSH: TSH: 1.32 u[IU]/mL (ref 0.35–4.50)

## 2020-02-16 LAB — C-REACTIVE PROTEIN: CRP: <1 mg/dL (ref 0.5–20.0)

## 2020-02-16 LAB — FOLATE: Folate: >23.7 ng/mL (ref >5.9)

## 2020-02-16 NOTE — Progress Notes (Signed)
Eliyas Suddreth Bedonie DOB: [birth year] Encounter date: 02/16/2020  This is a 86 y.o. male who presents with Chief Complaint  Patient presents with  . Follow-up    History of present illness: Getting stinging in right ear; also solid pain down below jaw. Everything started after they did treatment on the ear. First treatment was 6 mo ago; but pain has continued/worsened. He takes the tylenol arthritis and it seems to help somewhat. Stinging keeps coming and going.   Hands are going to sleep on him. Has had this trouble for a few months, but more intermittent. Now with activity. Bothers with eating; dropping utensils, taking insulin shot. Numbness goes up to forearms. Right feels a little worse than left. States that yesterday was first time he noted numbness in feet with stepping down. Had nerve studies done with pain management in the last year. Does have neck pain, but this is stable. Does feel like there is some weakness as well.   Balance issue - balance is pretty bad. Needs walker or cane to keep balance. Does feel like this has gotten worse. Feels like he gets weak with standing; like can't complete load of laundry.   Feels that memory is leaving him. Can't remember things and getting things tangled up. What he hears isn't what was said. Going to rooms in house and can't remember what he was there for. Wife states that he can't remember 10 minutes ago what was discussed. Noticed in last year. At first not quite as bad. Can remember remote incidents/facts, but short term is more difficult. Harder time with name finding (like streets).   HTN: running well at home; similar to today.  CAD: follows with cardiology - bp and cholesterol controlled.   CHF, diastolic: follows with cardiology.   GERD: takes the omeprazole which keeps sx away. If stops medicine then sx restart.   DMII: follows with Dr. Cruzita Lederer. A1C 6/1 was 6.6. has been working on weight loss - watching what he is eating and working  on making healthier choices.   HL: on the pravastatin 15m daily.  No problems tolerating medication.  OAB: myrbetriq has helped in the past, but he still has incontinence.  He does feel that this has been worse within the last year.  Does see derm at surgical center  Lymphocytosis: Seeing oncology and follow-up in June.    Allergies  Allergen Reactions  . Ciprofloxacin Other (See Comments)    "made me have a weird feeling. Disoriented."   . Sulfamethoxazole Rash   Current Meds  Medication Sig  . Acetaminophen (TYLENOL ARTHRITIS PAIN PO) Take 625 mg by mouth 3 (three) times daily.   .Marland KitchenamLODipine (NORVASC) 5 MG tablet Take 1 tablet (5 mg total) by mouth daily.  .Marland Kitchenaspirin 81 MG tablet Take 81 mg by mouth daily.   . B-D UF III MINI PEN NEEDLES 31G X 5 MM MISC USE TO TEST BLOOD SUGAR FOUR TIMES A DAY  . Blood Glucose Monitoring Suppl (ACCU-CHEK GUIDE) w/Device KIT 1 kit by Does not apply route See admin instructions. Check blood sugar 3 times a day  . furosemide (LASIX) 40 MG tablet Take 1 tablet (40 mg total) by mouth daily.  .Marland Kitchenglucose blood (ACCU-CHEK GUIDE) test strip Use to check blood sugar 3 times a day  . HUMALOG KWIKPEN 100 UNIT/ML KwikPen INJECT 6 TO 10 UNITS INTO THE SKIN BEFORE EACH MEAL THREE TIMES A DAY  . Insulin Detemir (LEVEMIR FLEXTOUCH) 100 UNIT/ML Pen INJECT 20 UNITS  INTO THE SKIN DAILY AT 10 PM  . Lancets (ACCU-CHEK MULTICLIX) lancets Use to check blood sugar 3 times a day  . Magnesium 200 MG TABS Take 1 tablet (200 mg total) by mouth daily.  . meclizine (ANTIVERT) 25 MG tablet Take 25 mg by mouth 2 (two) times daily.  . metFORMIN (GLUCOPHAGE) 1000 MG tablet TAKE ONE TABLET BY MOUTH TWICE A DAY WITH MEALS  . Multiple Vitamin (MULTIVITAMIN ADULT PO) Take by mouth.  . Multiple Vitamins-Minerals (CENTRUM SILVER PO) Take 1 tablet by mouth daily.   Marland Kitchen omeprazole (PRILOSEC) 20 MG capsule TAKE ONE CAPSULE BY MOUTH DAILY  . OVER THE COUNTER MEDICATION   . pravastatin  (PRAVACHOL) 40 MG tablet TAKE ONE TABLET BY MOUTH EVERY EVENING  . Probiotic Product (PROBIOTIC PO) Take by mouth.  . Propylene Glycol (SYSTANE BALANCE) 0.6 % SOLN Apply to eye. 2 drops each eye once daily  . vitamin B-12 (CYANOCOBALAMIN) 1000 MCG tablet Take 1,000 mcg by mouth daily.    Review of Systems  Constitutional: Negative for chills, fatigue and fever.  Respiratory: Negative for cough, chest tightness, shortness of breath and wheezing.   Cardiovascular: Negative for chest pain, palpitations and leg swelling.  Neurological: Positive for tremors, weakness and numbness. Negative for dizziness and light-headedness.    Objective:  BP 120/60 (BP Location: Left Arm, Patient Position: Sitting, Cuff Size: Normal)   Pulse 74   Temp (!) 97.3 F (36.3 C) (Temporal)   Ht 5' 10"  (1.778 m)   Wt 186 lb (84.4 kg)   SpO2 97%   BMI 26.69 kg/m   Weight: 186 lb (84.4 kg)   BP Readings from Last 3 Encounters:  02/16/20 120/60  11/25/19 133/69  10/16/19 125/67   Wt Readings from Last 3 Encounters:  02/16/20 186 lb (84.4 kg)  11/25/19 186 lb 1.6 oz (84.4 kg)  10/16/19 180 lb (81.6 kg)    Physical Exam Constitutional:      General: He is not in acute distress.    Appearance: He is well-developed. He is not diaphoretic.  HENT:     Head: Normocephalic and atraumatic.     Right Ear: External ear normal.     Left Ear: External ear normal.  Eyes:     General: Lids are normal.     Conjunctiva/sclera: Conjunctivae normal.     Pupils: Pupils are equal, round, and reactive to light.  Neck:     Thyroid: No thyromegaly.  Cardiovascular:     Rate and Rhythm: Normal rate and regular rhythm.     Heart sounds: Normal heart sounds. No murmur. No friction rub. No gallop.   Pulmonary:     Effort: Pulmonary effort is normal. No respiratory distress.     Breath sounds: Rales (min bilat LL; atelectasis) present. No wheezing.  Musculoskeletal:     Cervical back: Neck supple.  Lymphadenopathy:      Cervical: No cervical adenopathy.  Skin:    General: Skin is warm and dry.  Neurological:     Mental Status: He is alert and oriented to person, place, and time.     Cranial Nerves: No cranial nerve deficit.     Motor: Weakness and tremor (resting hands bilat) present. No abnormal muscle tone.     Deep Tendon Reflexes: Reflexes normal.     Reflex Scores:      Tricep reflexes are 1+ on the right side and 1+ on the left side.      Bicep reflexes are 1+ on  the right side and 1+ on the left side.      Brachioradialis reflexes are 1+ on the right side and 1+ on the left side.    Comments: Monofilament sensation is decreased on plantar aspect of feet bilaterally.  He is only able to feel slightly on the lateral edge of his right foot, but not on his left.  Sensation resumes on dorsal aspect about mid foot bilaterally.  Decreased sensation to gross touch bilateral hands.  There is some weakness bilaterally to handgrip.  Some atrophy of the thenar eminence bilaterally.  Psychiatric:        Behavior: Behavior normal.     Assessment/Plan  1. Hyperkalemia Recheck blood work. - Comprehensive metabolic panel; Future - Comprehensive metabolic panel  2. Neuropathy He has had evaluation within the last couple of years with EMG.  Wife states that he was diagnosed with carpal tunnel syndrome.  I cannot see this testing in the system and requested these records as this will help [place] plan next step/further evaluation. - TSH; Future - TSH  3. B12 deficiency - Vitamin B12; Future - Folate; Future - Folate - Vitamin B12  4. Hypertension associated with diabetes ([place]) Well-controlled.  Continue current medications. - CBC with Differential/Platelet; Future - CBC with Differential/Platelet  5. Diabetic peripheral neuropathy associated with type 2 diabetes mellitus ([facility]) Blood sugars been well controlled.  Continue current medications.  6. Bilateral carpal tunnel syndrome See above.  Previous  diagnosis based off of EMG (per wife).  Records requested.  7. Urinary incontinence, unspecified type He has had evaluation of the bladder in the past, although this issue is worse in the last year.  I do think since he has not had imaging of the head for some time and there is concern with increased urinary incontinence, memory impairment, and gait that it would be reasonable to consider MRI to rule out normal pressure hydrocephalus.  8. Memory deficit See above.  Further evaluation/treatment consideration pending record review from above.  Montreal cognitive assessment will be scanned in.  9. Arthritis - ANA; Future - C-reactive protein; Future - Sedimentation rate; Future - Sedimentation rate - C-reactive protein - ANA  10. Anemia, unspecified type - IBC + Ferritin; Future - IBC + Ferritin   Return for pending labs/record review.  Total time spent with patient and wife reviewing current symptoms, discussing previous evaluation, reviewing prior records and requesting records we do not have access to as well as chart review and documentation over 45 minutes.  We also discussed treatment options for memory impairment, plans for consideration of repeat head imaging, and option of further neurological evaluation.   Micheline Rough, MD

## 2020-02-18 LAB — ANTI-NUCLEAR AB-TITER (ANA TITER)
ANA TITER: 1:40 {titer} — ABNORMAL HIGH
ANA Titer 1: 1:40 {titer} — ABNORMAL HIGH

## 2020-02-18 LAB — ANA: Anti Nuclear Antibody (ANA): POSITIVE — AB

## 2020-02-23 NOTE — Addendum Note (Signed)
Addended by: Agnes Lawrence on: 02/23/2020 03:35 PM   Modules accepted: Orders

## 2020-02-25 ENCOUNTER — Other Ambulatory Visit: Payer: Self-pay

## 2020-02-25 ENCOUNTER — Ambulatory Visit: Payer: Medicare PPO | Admitting: Internal Medicine

## 2020-02-25 ENCOUNTER — Encounter: Payer: Self-pay | Admitting: Internal Medicine

## 2020-02-25 VITALS — BP 142/80 | HR 68 | Ht 70.0 in | Wt 184.0 lb

## 2020-02-25 DIAGNOSIS — E785 Hyperlipidemia, unspecified: Secondary | ICD-10-CM

## 2020-02-25 DIAGNOSIS — E1169 Type 2 diabetes mellitus with other specified complication: Secondary | ICD-10-CM

## 2020-02-25 DIAGNOSIS — Z794 Long term (current) use of insulin: Secondary | ICD-10-CM | POA: Diagnosis not present

## 2020-02-25 DIAGNOSIS — E1142 Type 2 diabetes mellitus with diabetic polyneuropathy: Secondary | ICD-10-CM

## 2020-02-25 LAB — POCT GLYCOSYLATED HEMOGLOBIN (HGB A1C): Hemoglobin A1C: 6.6 % — AB (ref 4.0–5.6)

## 2020-02-25 NOTE — Patient Instructions (Addendum)
Please continue: - Metformin 1000 mg 2x a day - Levemir 16 units at night  Change: - Humalog 15 min before a meal:  4-5 units before a small meal 6 units before a regular meal 8 units before a large meal Please subtract 2 units if you plan to be active after a meal.  If the sugars in am are consistently <80, decrease Levemir to 14 units.  Please come back for a follow-up appointment in 4-6 months.

## 2020-02-25 NOTE — Progress Notes (Signed)
Subjective:     Patient ID: [name], male   DOB: 05-Oct-1933, 86 y.o.   MRN: [id number]  This visit occurred during the SARS-CoV-2 public health emergency.  Safety protocols were in place, including screening questions prior to the visit, additional usage of staff PPE, and extensive cleaning of exam room while observing appropriate contact time as indicated for disinfecting solutions.   HPI [name] is a very pleasant 86 y.o. man returning for management of DM2, dx 07/2012, uncontrolled, insulin-dependent, with complications (CAD, peripheral neuropathy, PAD). Last visit 5 months ago.  He continues to have weakness in his legs and poor equilibrium after his fall in 09/2015.  He feels that this is getting worse.  He finished physical therapy and also tried acupuncture.  An MRI showed osteoarthritis.  She got steroid injections in the neck.  She saw neurosurgery.  He is now seeing pain management but stopped steroid injections.  Reviewed his HbA1c levels: Lab Results  Component Value Date   HGBA1C 6.5 (A) 09/17/2019   HGBA1C 6.6 (A) 05/12/2019   HGBA1C 7.2 (A) 01/01/2019   He is on: - Metformin 1000 mg 2x a day - Levemir 18 >> 16 units at night - Humalog 15 min before a meal:  4 units before a small meal (use 4 units before breakfast) 6 units before a regular meal 8 units before a large meal Please subtract 2 units if you plan to be active after a meal.  I reviewed his log-checks sugars 2-4 times a day: - am: 71-120, 125 >> 80-120, 135 >> 75-120 >> 80-110 >> 74-108 - 2h after b'fast: 65, 88-145 >> 108-130 >> n/c >> 110-130 >> 110, 114 - before lunch: 78, 92-157, 175 >> 78-150, 160 >> 96-145, 160 >> 90, 105-160 - 2h after lunch: 97-144 >> 107-152, 180 >> 182 >> 160 >> 135-160 - dinner: 103-160 (snack) >> 94-170 >> 133-175 >> 119-156, 168 - 2h after dinner:  91, 130-175, 185 >> 120, 163 >> 110 >> 107-165 - bedtime:  99-165, 170 >> 135-167, 170 >> 115-170 >> 88-162 - nighttime:  73-150 >> 110-140 >> 115-158 >> 96-145 >> 86-130 Has hypoglycemia awareness in the 70s  -No CKD. Last BUN/Cr: Lab Results  Component Value Date   BUN 23 02/16/2020   CREATININE 1.03 02/16/2020  Not on an ACE inhibitor or ARB. -+ HL; last lipid panel: Lab Results  Component Value Date   CHOL 121 08/06/2019   HDL 49.90 08/06/2019   LDLCALC 50 08/06/2019   TRIG 104.0 08/06/2019   CHOLHDL 2 08/06/2019  On pravastatin. -+ Numbness and tingling in his feet.  Also, now numbness in hands. He takes a B complex daily (decreased to half) and a compounded neuropathy cream prn. Sees Dr. Barkley Bruns at [facility]. - Last eye exam 10/2019: No DR (Dr. Alois Cliche).  He had cataract surgery in 08/2015.  He has macular degeneration and is on Preservision Areds vitamins. On ASA 81.  He had a heart catheterization on 09/03/2014 >> 2 small Blockages >> no intervention other than statins. 2D ECHO with normal EF. In 08/2015 he was hospitalized for ?sepsis with strep pneumoniae after acquiring left lower lobe pneumonia.  Review of Systems Constitutional: no weight gain/no weight loss, no fatigue, no subjective hyperthermia, no subjective hypothermia, + nocturia Eyes: no blurry vision, no xerophthalmia ENT: no sore throat, no nodules palpated in neck, no dysphagia, no odynophagia, no hoarseness Cardiovascular: no CP/no SOB/no palpitations/no leg swelling Respiratory: no cough/no SOB/no  wheezing Gastrointestinal: no N/no V/no D/no C/no acid reflux Musculoskeletal: no muscle aches/no joint aches Skin: no rashes, no hair loss Neurological: no tremors/+ numbness/+ tingling/no dizziness  I reviewed pt's medications, allergies, PMH, social hx, family hx, and changes were documented in the history of present illness. Otherwise, unchanged from my initial visit note.  Past Medical History:  Diagnosis Date  . Anemia   . Atherosclerotic PVD with intermittent claudication ([place])   . CAD (coronary artery  disease)   . Diabetes mellitus    type 2 with peripheral neuropathy  . Diastolic CHF, chronic ([place])   . Diverticulosis   . DVT (deep venous thrombosis) ([place])   . Dyslipidemia   . Edema 08/19/2014  . GERD (gastroesophageal reflux disease)    hx hiatal hernia  . HTN (hypertension)   . IBS (irritable bowel syndrome)    Hx: of  . Osteoarthritis   . Overactive bladder    Hx: of stress incontinence  . Pneumonia 2002   history of  . Shingles   . Varicose veins of lower extremities with other complications 5/45/6256   Past Surgical History:  Procedure Laterality Date  . BACK SURGERY    . CATARACT EXTRACTION Bilateral 08/10/2015,08/15/2015  . COLONOSCOPY     Hx: of  . CTS bilateral    . KNEE ARTHROSCOPY  1988, 1992   bilateral  . LEFT AND RIGHT HEART CATHETERIZATION WITH CORONARY ANGIOGRAM N/A 09/03/2014   Procedure: LEFT AND RIGHT HEART CATHETERIZATION WITH CORONARY ANGIOGRAM;  Surgeon: [name], MD;  Location: [facility] CATH LAB;  Service: Cardiovascular;  Laterality: N/A;  . LUMBAR LAMINECTOMY/DECOMPRESSION MICRODISCECTOMY N/A 08/18/2013   Procedure: Lumbar two-three, lumbar three-four, lumbar four-five decompressive laminectomies;  Surgeon: [name], MD;  Location: [facility] NEURO ORS;  Service: Neurosurgery;  Laterality: N/A;  . TONSILLECTOMY    . TOTAL KNEE ARTHROPLASTY Bilateral 2005, 2007   Social History   Socioeconomic History  . Marital status: Married    Spouse name: Not on file  . Number of children: 2  . Years of education: Not on file  . Highest education level: Not on file  Occupational History  . Not on file  Tobacco Use  . Smoking status: Never Smoker  . Smokeless tobacco: Never Used  Substance and Sexual Activity  . Alcohol use: No    Alcohol/week: 0.0 standard drinks  . Drug use: No  . Sexual activity: Not on file  Other Topics Concern  . 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 Difficulty of Paying Living Expenses:   Food Insecurity:   . Worried About Charity fundraiser in the Last Year:   . Arboriculturist in the Last Year:   Transportation Needs:   . Film/video editor (Medical):   Marland Kitchen Lack of Transportation (Non-Medical):   Physical Activity:   . Days of Exercise per Week:   . Minutes of Exercise per Session:   Stress:   . Feeling of Stress :   Social Connections:   .  Frequency of Communication with Friends and Family:   . Frequency of Social Gatherings with Friends and Family:   . Attends Religious Services:   . Active Member of Clubs or Organizations:   . Attends Archivist Meetings:   Marland Kitchen Marital Status:   Intimate Partner Violence:   . Fear of Current or Ex-Partner:   . Emotionally Abused:   Marland Kitchen Physically Abused:   . Sexually Abused:    Current Outpatient Medications on File Prior to Visit  Medication Sig Dispense Refill  . Acetaminophen (TYLENOL ARTHRITIS PAIN PO) Take 625 mg by mouth 3 (three) times daily.     Marland Kitchen amLODipine (NORVASC) 5 MG tablet Take 1 tablet (5 mg total) by mouth daily. 90 tablet 1  . aspirin 81 MG tablet Take 81 mg by mouth daily.     . B-D UF III MINI PEN NEEDLES 31G X 5 MM MISC USE TO TEST BLOOD SUGAR FOUR TIMES A DAY 200 each 11  . Blood Glucose Monitoring Suppl (ACCU-CHEK GUIDE) w/Device KIT 1 kit by Does not apply route See admin instructions. Check blood sugar 3 times a day 1 kit 0  . furosemide (LASIX) 40 MG tablet Take 1 tablet (40 mg total) by mouth daily. 90 tablet 1  . glucose blood (ACCU-CHEK GUIDE) test strip Use to check blood sugar 3 times a day 300 each 12  . HUMALOG KWIKPEN 100 UNIT/ML KwikPen INJECT 6 TO 10  UNITS INTO THE SKIN BEFORE EACH MEAL THREE TIMES A DAY 15 mL 2  . Insulin Detemir (LEVEMIR FLEXTOUCH) 100 UNIT/ML Pen INJECT 20 UNITS INTO THE SKIN DAILY AT 10 PM 15 mL 2  . Lancets (ACCU-CHEK MULTICLIX) lancets Use to check blood sugar 3 times a day 300 each 12  . Magnesium 200 MG TABS Take 1 tablet (200 mg total) by mouth daily. 30 each 11  . meclizine (ANTIVERT) 25 MG tablet Take 25 mg by mouth 2 (two) times daily.    . metFORMIN (GLUCOPHAGE) 1000 MG tablet TAKE ONE TABLET BY MOUTH TWICE A DAY WITH MEALS 180 tablet 1  . Multiple Vitamin (MULTIVITAMIN ADULT PO) Take by mouth.    . Multiple Vitamins-Minerals (CENTRUM SILVER PO) Take 1 tablet by mouth daily.     Marland Kitchen omeprazole (PRILOSEC) 20 MG capsule TAKE ONE CAPSULE BY MOUTH DAILY 90 capsule 0  . OVER THE COUNTER MEDICATION     . pravastatin (PRAVACHOL) 40 MG tablet TAKE ONE TABLET BY MOUTH EVERY EVENING 90 tablet 3  . Probiotic Product (PROBIOTIC PO) Take by mouth.    . Propylene Glycol (SYSTANE BALANCE) 0.6 % SOLN Apply to eye. 2 drops each eye once daily    . vitamin B-12 (CYANOCOBALAMIN) 1000 MCG tablet Take 1,000 mcg by mouth daily.     No current facility-administered medications on file prior to visit.   Allergies  Allergen Reactions  . Ciprofloxacin Other (See Comments)    "made me have a weird feeling. Disoriented."   . Sulfamethoxazole Rash   Family History  Problem Relation Age of Onset  . Other Mother 41       CABG  . Heart disease Mother   . Hypertension Mother   . Hyperlipidemia Son     Objective:   Physical Exam BP (!) 142/80   Pulse 68   Ht '5\' 10"'$  (1.778 m)   Wt 184 lb (83.5 kg)   SpO2 97%   BMI 26.40 kg/m  Body mass index is 26.4 kg/m.  Wt Readings  from Last 3 Encounters:  02/25/20 184 lb (83.5 kg)  02/16/20 186 lb (84.4 kg)  11/25/19 186 lb 1.6 oz (84.4 kg)   Constitutional: overweight, in NAD, walks with a walker Eyes: PERRLA, EOMI, no exophthalmos ENT: moist mucous membranes, no thyromegaly, no  cervical lymphadenopathy Cardiovascular: RRR, No RG, +1/6 SEM Respiratory: CTA B Gastrointestinal: abdomen soft, NT, ND, BS+ Musculoskeletal: no deformities, strength intact in all 4 Skin: moist, warm, no rashes Neurological: no tremor with outstretched hands, DTR normal in all 4  Assessment:     1. DM2, insulin-dependent, uncontrolled, with complications; target H9V for him: 7-7.5% - CAD - peripheral neuropathy - PAD Dr. [name]  2. PN - 2/2 DM  3. HL  Plan:     1. Pt with longstanding, previously uncontrolled type 2 diabetes, on basal-bolus insulin regimen and also Metformin, with improvement of control in the last 3-1/2 years.  We could decrease the dose of his Humalog and Levemir as his sugars improved.  Latest HbA1c was 6.5%, excellent.  We did not change his regimen at our last visit, 5 months ago.  The majority of his sugars were at goal at that time, without significant hyper or hypoglycemia spikes.  He usually has higher blood sugars before dinner as he has a snack in the afternoon. -At this visit, sugars are still at goal, occasionally higher before lunch, and I advised him to write down what she eats before the sugars are in the 150s-160 before lunch, and maybe use 5 units of insulin for breakfast with those meals.  His sugars in the morning are also occasionally in the 70s and the majority are under 100.  I will advise him that if he has more sugars in the 70s or even lower, to decrease the dose of Levemir.  Otherwise, his sugars are well controlled, mostly at goal so I would not suggest to change her regimen. - I advised him to: Patient Instructions  Please continue: - Metformin 1000 mg 2x a day - Levemir 16 units at night  Change: - Humalog 15 min before a meal:  4-5 units before a small meal 6 units before a regular meal 8 units before a large meal Please subtract 2 units if you plan to be active after a meal.  If the sugars in am are consistently <80, decrease  Levemir to 14 units.  Please come back for a follow-up appointment in 4-6 months.  - we checked his HbA1c: 6.6% (slightly higher, but still excellent) - advised to check sugars at different times of the day - 3-4x a day, rotating check times - advised for yearly eye exams >> he is UTD - return to clinic in 4-6 months   2. PN -Due to diabetes -Stable, no new symptoms -Sees Dr. Barkley Bruns with podiatry -Continues on B complex daily and neuropathy cream as needed  3. HL -Review latest lipid panel from 07/2019: All fractions at goal: Lab Results  Component Value Date   CHOL 121 08/06/2019   HDL 49.90 08/06/2019   LDLCALC 50 08/06/2019   TRIG 104.0 08/06/2019   CHOLHDL 2 08/06/2019  -Continues pravastatin without side effects  Bobby Kingdom, MD PhD [facility] Endocrinology

## 2020-02-25 NOTE — Addendum Note (Signed)
Addended by: Cardell Peach I on: 02/25/2020 08:20 AM   Modules accepted: Orders

## 2020-03-02 ENCOUNTER — Telehealth: Payer: Self-pay | Admitting: Family Medicine

## 2020-03-02 NOTE — Telephone Encounter (Signed)
I called [facility] and spoke with Cecille Rubin.  Cecille Rubin stated the date of the last MRI cervical spine was in September 2018 and she will fax this to our office.  Fax received and placed on Dr Berenice Bouton desk.

## 2020-03-02 NOTE — Telephone Encounter (Signed)
Pt stated that he was speaking to Wendie Simmer earlier and he just now remembered he had an MRI through Dr. Cyril Mourning through [place].   Pt can be reached at [phone]

## 2020-03-02 NOTE — Telephone Encounter (Signed)
Will review when back in office.

## 2020-03-09 NOTE — Telephone Encounter (Signed)
Dr Ethlyn Gallery reviewed the cervical spine MRI from 08/24/2017 and stated she would suggest the pt follow up with ortho regardless of the results from the MRI of the brain as the neck should be re-evaluated due to these findings and worsening of symptoms.  I called the pt and informed him and Mrs Haning of this and the pt stated he did not want to go back to [facility] and the neurosurgeon was treating his neck and he has seen Dr Brien Few.  I advised the pt to contact the neurosurgeon for recommendations and message forwarded to PCP.

## 2020-03-15 NOTE — Telephone Encounter (Signed)
Spoke with the pts wife and informed her of the message below. 

## 2020-03-15 NOTE — Telephone Encounter (Signed)
Agree with contacting neurosurgeon for follow up. Thanks.

## 2020-03-18 ENCOUNTER — Other Ambulatory Visit: Payer: Self-pay | Admitting: *Deleted

## 2020-03-18 MED ORDER — ACCU-CHEK GUIDE VI STRP: ORAL_STRIP | 12 refills | Status: DC

## 2020-03-19 ENCOUNTER — Other Ambulatory Visit: Payer: Self-pay

## 2020-03-19 MED ORDER — ACCU-CHEK GUIDE VI STRP: ORAL_STRIP | 12 refills | Status: AC

## 2020-03-30 ENCOUNTER — Other Ambulatory Visit: Payer: Self-pay

## 2020-03-30 MED ORDER — ACCU-CHEK SOFTCLIX LANCETS MISC: 12 refills | Status: DC

## 2020-03-30 MED ORDER — ACCU-CHEK SOFTCLIX LANCETS MISC: 12 refills | Status: AC

## 2020-03-31 ENCOUNTER — Other Ambulatory Visit: Payer: Self-pay

## 2020-03-31 ENCOUNTER — Ambulatory Visit
Admission: RE | Admit: 2020-03-31 | Discharge: 2020-03-31 | Disposition: A | Discharge status: Home / Self Care | Payer: Medicare PPO | Source: Ambulatory Visit | Attending: Family Medicine | Admitting: Family Medicine

## 2020-03-31 DIAGNOSIS — R443 Hallucinations, unspecified: Secondary | ICD-10-CM | POA: Diagnosis not present

## 2020-03-31 DIAGNOSIS — F0390 Unspecified dementia without behavioral disturbance: Secondary | ICD-10-CM

## 2020-03-31 DIAGNOSIS — R413 Other amnesia: Secondary | ICD-10-CM | POA: Diagnosis not present

## 2020-04-01 ENCOUNTER — Other Ambulatory Visit: Payer: Self-pay | Admitting: *Deleted

## 2020-04-01 DIAGNOSIS — R9089 Other abnormal findings on diagnostic imaging of central nervous system: Secondary | ICD-10-CM

## 2020-04-02 ENCOUNTER — Encounter: Payer: Self-pay | Admitting: Neurology

## 2020-04-16 ENCOUNTER — Other Ambulatory Visit: Payer: Self-pay | Admitting: Internal Medicine

## 2020-04-21 ENCOUNTER — Encounter: Payer: Self-pay | Admitting: Family Medicine

## 2020-04-23 ENCOUNTER — Telehealth: Payer: Self-pay | Admitting: *Deleted

## 2020-04-23 DIAGNOSIS — G5623 Lesion of ulnar nerve, bilateral upper limbs: Secondary | ICD-10-CM | POA: Diagnosis not present

## 2020-04-23 DIAGNOSIS — M47812 Spondylosis without myelopathy or radiculopathy, cervical region: Secondary | ICD-10-CM | POA: Diagnosis not present

## 2020-04-23 DIAGNOSIS — G5603 Carpal tunnel syndrome, bilateral upper limbs: Secondary | ICD-10-CM | POA: Diagnosis not present

## 2020-04-23 DIAGNOSIS — M4802 Spinal stenosis, cervical region: Secondary | ICD-10-CM | POA: Diagnosis not present

## 2020-04-23 NOTE — Telephone Encounter (Signed)
   [facility] Pre-operative Risk Assessment    HEARTCARE STAFF: - Please ensure there is not already an duplicate clearance open for this procedure - Under Visit Info/Reason for Call, type in Other and utilize the format Clearance MM/DD/YY or Clearance TBD  Request for surgical clearance:  1. What type of surgery is being performed? CERVICAL INJECTION   2. When is this surgery scheduled? TBD   3. What type of clearance is required (medical clearance vs. Pharmacy clearance to hold med vs. Both)? MEDICAL  4. Are there any medications that need to be held prior to surgery and how long? ASA X 7 DAYS PRIOR TO INJECTION   5. Practice name and name of physician performing surgery? [facility]; DR. Gwenlyn Perking BARTKO   6. What is the office phone number? [phone]   7.   What is the office fax number? [phone]  8.   Anesthesia type (None, local, MAC, general) ? NONE LISTED   Julaine Hua 04/23/2020, 12:35 PM  _________________________________________________________________   (provider comments below)

## 2020-04-27 NOTE — Telephone Encounter (Signed)
   Primary Cardiologist: Kirk Ruths, MD  Chart reviewed as part of pre-operative protocol coverage. Hx of CAD, HTN and chronic diastolic CHF. Cardiac catheterization September 2015 showed a 30% left main, 40-50% LAD and no obstructive disease in the circumflex or right coronary artery. Low risk stress test 01/2018. He was doing well when last seen by Dr. [name].   Dr. [name], can patient hold aspirin 7 days for cervical injection? Please forward your response to P [facility] PREOP.   Thank you  Leanor Kail, PA 04/27/2020, 4:10 PM

## 2020-04-27 NOTE — Telephone Encounter (Signed)
May hold aspirin 7 days prior to procedure and resume day after. Kirk Ruths

## 2020-05-17 ENCOUNTER — Other Ambulatory Visit: Payer: Self-pay | Admitting: Family Medicine

## 2020-05-20 DIAGNOSIS — G5603 Carpal tunnel syndrome, bilateral upper limbs: Secondary | ICD-10-CM | POA: Diagnosis not present

## 2020-05-20 DIAGNOSIS — G5623 Lesion of ulnar nerve, bilateral upper limbs: Secondary | ICD-10-CM | POA: Diagnosis not present

## 2020-05-23 ENCOUNTER — Other Ambulatory Visit: Payer: Self-pay | Admitting: Family Medicine

## 2020-05-23 ENCOUNTER — Other Ambulatory Visit: Payer: Self-pay | Admitting: Internal Medicine

## 2020-05-25 DIAGNOSIS — M47812 Spondylosis without myelopathy or radiculopathy, cervical region: Secondary | ICD-10-CM | POA: Diagnosis not present

## 2020-05-26 DIAGNOSIS — L570 Actinic keratosis: Secondary | ICD-10-CM | POA: Diagnosis not present

## 2020-05-26 DIAGNOSIS — Z85828 Personal history of other malignant neoplasm of skin: Secondary | ICD-10-CM | POA: Diagnosis not present

## 2020-05-26 DIAGNOSIS — L905 Scar conditions and fibrosis of skin: Secondary | ICD-10-CM | POA: Diagnosis not present

## 2020-05-31 NOTE — Progress Notes (Signed)
HEMATOLOGY/ONCOLOGY CLINIC NOTE  Date of Service: 06/01/2020  Patient Care Team: Caren Macadam, MD as PCP - General (Family Medicine) [name] Denice Bors, MD as PCP - Cardiology (Cardiology) Hillary Bow, MD as Attending Physician (Cardiology) Philemon Kingdom, MD as Consulting Physician (Endocrinology) Idolina Primer Warnell Bureau [facility]) Marlaine Hind, MD as Consulting Physician (Physical Medicine and Rehabilitation) Newman Pies, MD as Consulting Physician (Neurosurgery)  CHIEF COMPLAINTS/PURPOSE OF CONSULTATION:  Monoclonal B cell lymphocytosis f/u  HISTORY OF PRESENTING ILLNESS:  [name] is a wonderful 86 y.o. male who has been referred to [place] by Dr Ethlyn Gallery for evaluation and management of lymphocytosis. Pt is accompanied today by his wife, Mrs. Sagona. The pt reports that he is doing well overall.  The pt reports that he has not felt any different in the last 3-6 months. Pt denies any new lumps or bumps. He does have a large lump on the back of his neck that has been previously evaluated and was determined to be a fatty lipoma. He is a diabetic and has constant numbess in his fingers. Pt is having increased balance issues and is noticing increasing numbness in his feet as well. He also has urinary  incontinence due to an overactive bladder. Pt does see a Dealer at [facility]. He had a LLE blood clot many years ago. He was having very little pain or swelling but it was caught by a physician during a routine inspection.  Pt is not a smoker and does not drink much alcohol. He has not used any steroids recently. He has not any recent infections or injuries. Pt had his annual flu shot in August. He is up to date with his Pneumonia and Shingles vaccines.   Most recent lab results (11/12/2019) of CBC w/diff  is as follows: all values are WNL except for Neutro Rel at 32.3, Lymphs Rel at 61.1, Lymphs Abs at 5.6K.  On review of systems, pt reports balance  issues, numbness in fingers/feet and denies fevers, chills, night sweats, unexpected weight loss, new lumps/bumps, abdominal pain and any other symptoms.   On PMHx the pt reports DVT, Diabetes, Overactive Bladder, CAD, Left and Right Heart Catheterization with Coronary Angiogram, B/L Knee Arthroscopy. On Social Hx the pt reports that he is a non-smoker and does not drink much alcohol.  INTERVAL HISTORY:   [name] is a wonderful 86 y.o. male who is here for evaluation and management of lymphocytosis. The patient's last visit with [place] was on 12/02/2019. The pt reports that he is doing well overall.  The pt reports that he is taking Lasix and was told to use an OTC Potassium alongside. Pt is currently taking one tablet of the Potassium supplement per day. He denies any constitutional symptoms or unexpected weight loss.  Of note since the patient's last visit, pt has had MRI Brain ([id number]) completed on 03/31/2020 with results revealing "No evidence of acute intracranial abnormality. There are two small chronic lacunar infarcts within the right basal ganglia which were not present on prior MRI 08/05/2015. Background mild chronic small vessel ischemic disease has progressed within the cerebral white matter. Stable, moderate generalized parenchymal atrophy. Redemonstrated ligamentous hypertrophy at the level of the skull base and C1-C2 articulation with narrowing of the craniocervical junction. As before, mild mass effect upon the ventral medulla and upper cervical cord."  Lab results today (06/01/20) of CBC w/diff and CMP is as follows: all values are WNL except for RBC at 4.18, Hgb at  12.4, Potassium at 5.8, Glucose at 131. 06/01/2020 LDH at 181  On review of systems, pt denies fevers, chills, night sweats, unexpected weight loss, abdominal pain and any other symptoms.    MEDICAL HISTORY:  Past Medical History:  Diagnosis Date  . Anemia   . Atherosclerotic PVD with intermittent  claudication ([place])   . CAD (coronary artery disease)   . Diabetes mellitus    type 2 with peripheral neuropathy  . Diastolic CHF, chronic ([place])   . Diverticulosis   . DVT (deep venous thrombosis) ([place])   . Dyslipidemia   . Edema 08/19/2014  . GERD (gastroesophageal reflux disease)    hx hiatal hernia  . HTN (hypertension)   . IBS (irritable bowel syndrome)    Hx: of  . Osteoarthritis   . Overactive bladder    Hx: of stress incontinence  . Pneumonia 2002   history of  . Shingles   . Varicose veins of lower extremities with other complications 0/86/7619    SURGICAL HISTORY: Past Surgical History:  Procedure Laterality Date  . BACK SURGERY    . CATARACT EXTRACTION Bilateral 08/10/2015,08/15/2015  . COLONOSCOPY     Hx: of  . CTS bilateral    . KNEE ARTHROSCOPY  1988, 1992   bilateral  . LEFT AND RIGHT HEART CATHETERIZATION WITH CORONARY ANGIOGRAM N/A 09/03/2014   Procedure: LEFT AND RIGHT HEART CATHETERIZATION WITH CORONARY ANGIOGRAM;  Surgeon: [name], MD;  Location: [facility] CATH LAB;  Service: Cardiovascular;  Laterality: N/A;  . LUMBAR LAMINECTOMY/DECOMPRESSION MICRODISCECTOMY N/A 08/18/2013   Procedure: Lumbar two-three, lumbar three-four, lumbar four-five decompressive laminectomies;  Surgeon: [name], MD;  Location: [place] NEURO ORS;  Service: Neurosurgery;  Laterality: N/A;  . TONSILLECTOMY    . TOTAL KNEE ARTHROPLASTY Bilateral 2005, 2007    SOCIAL HISTORY: Social History   Socioeconomic History  . Marital status: Married    Spouse name: Not on file  . Number of children: 2  . Years of education: Not on file  . Highest education level: Not on file  Occupational History  . Not on file  Tobacco Use  . Smoking status: Never Smoker  . Smokeless tobacco: Never Used  Vaping Use  . Vaping Use: Never used  Substance and Sexual Activity  . Alcohol use: No    Alcohol/week: 0.0 standard drinks  . Drug use: No  . Sexual activity: Not on file  Other Topics  Concern  . 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 Difficulty of Paying Living Expenses:   Food Insecurity:   . Worried About Charity fundraiser in the Last Year:   . Arboriculturist in the Last Year:   Transportation Needs:   . Film/video editor (Medical):   Marland Kitchen Lack of Transportation (Non-Medical):   Physical Activity:   . Days of Exercise per Week:   . Minutes of Exercise per Session:   Stress:   . Feeling of Stress :   Social Connections:   . Frequency of Communication with  Friends and Family:   . Frequency of Social Gatherings with Friends and Family:   . Attends Religious Services:   . Active Member of Clubs or Organizations:   . Attends Archivist Meetings:   Marland Kitchen Marital Status:   Intimate Partner Violence:   . Fear of Current or Ex-Partner:   . Emotionally Abused:   Marland Kitchen Physically Abused:   . Sexually Abused:     FAMILY HISTORY: Family History  Problem Relation Age of Onset  . Other Mother 1       CABG  . Heart disease Mother   . Hypertension Mother   . Hyperlipidemia Son     ALLERGIES:  is allergic to ciprofloxacin and sulfamethoxazole.  MEDICATIONS:  Current Outpatient Medications  Medication Sig Dispense Refill  . Accu-Chek Softclix Lancets lancets Use to check blood sugar 5 times a day. 500 each 12  . Acetaminophen (TYLENOL ARTHRITIS PAIN PO) Take 625 mg by mouth 3 (three) times daily.     Marland Kitchen amLODipine (NORVASC) 5 MG tablet TAKE ONE TABLET BY MOUTH DAILY 90 tablet 0  . aspirin 81 MG tablet Take 81 mg by mouth daily.     . B-D UF III MINI PEN NEEDLES 31G X 5 MM MISC USE TO TEST BLOOD  SUGAR FOUR TIMES A DAY 200 each 11  . Blood Glucose Monitoring Suppl (ACCU-CHEK GUIDE) w/Device KIT 1 kit by Does not apply route See admin instructions. Check blood sugar 3 times a day 1 kit 0  . ferrous sulfate 325 (65 FE) MG tablet Take 325 mg by mouth daily with breakfast.    . furosemide (LASIX) 40 MG tablet Take 1 tablet (40 mg total) by mouth daily. 90 tablet 1  . glucose blood (ACCU-CHEK GUIDE) test strip Use to check blood sugar 3 times a day 270 each 12  . HUMALOG KWIKPEN 100 UNIT/ML KwikPen INJECT 6 TO 10 UNITS INTO THE SKIN BEFORE EACH MEAL THREE TIMES A DAY 27 mL 2  . Insulin Detemir (LEVEMIR FLEXTOUCH) 100 UNIT/ML Pen INJECT 20 UNITS INTO THE SKIN DAILY AT 10 PM 15 mL 2  . Magnesium 200 MG TABS Take 1 tablet (200 mg total) by mouth daily. 30 each 11  . metFORMIN (GLUCOPHAGE) 1000 MG tablet TAKE ONE TABLET BY MOUTH TWICE A DAY WITH MEALS 180 tablet 0  . Multiple Vitamin (MULTIVITAMIN ADULT PO) Take by mouth.    . Multiple Vitamins-Minerals (CENTRUM SILVER PO) Take 1 tablet by mouth daily.     Marland Kitchen omeprazole (PRILOSEC) 20 MG capsule TAKE ONE CAPSULE BY MOUTH DAILY 90 capsule 0  . OVER THE COUNTER MEDICATION     . pravastatin (PRAVACHOL) 40 MG tablet TAKE ONE TABLET BY MOUTH EVERY EVENING 90 tablet 3  . Probiotic Product (PROBIOTIC PO) Take by mouth.    . Propylene Glycol (SYSTANE BALANCE) 0.6 % SOLN Apply to eye. 2 drops each eye once daily    . vitamin B-12 (CYANOCOBALAMIN) 1000 MCG tablet Take 1,000 mcg by mouth daily.     No current facility-administered medications for this visit.    REVIEW OF SYSTEMS:   A 10+ POINT REVIEW OF SYSTEMS WAS OBTAINED including neurology, dermatology, psychiatry, cardiac, respiratory, lymph, extremities, GI, GU, Musculoskeletal, constitutional, breasts, reproductive, HEENT.  All pertinent positives are noted in the HPI.  All others are negative.   PHYSICAL EXAMINATION: ECOG PERFORMANCE STATUS: 2 - Symptomatic, <50% confined to bed  . Vitals:    06/01/20 1047  BP: 115/62  Pulse: 69  Resp: 18  Temp: 97.7 F (36.5 C)  SpO2: 97%   Filed Weights   06/01/20 1047  Weight: 185 lb 12.8 oz (84.3 kg)   .Body mass index is 26.66 kg/m.  Exam was given in chair   GENERAL:alert, in no acute distress and comfortable SKIN: no acute rashes, no significant lesions EYES: conjunctiva are pink and non-injected, sclera anicteric OROPHARYNX: MMM, no exudates, no oropharyngeal erythema or ulceration NECK: supple, no JVD LYMPH:  no palpable lymphadenopathy in the cervical, axillary or inguinal regions LUNGS: clear to auscultation b/l with normal respiratory effort HEART: regular rate & rhythm ABDOMEN:  normoactive bowel sounds , non tender, not distended. No palpable hepatosplenomegaly.  Extremity: no pedal edema PSYCH: alert & oriented x 3 with fluent speech NEURO: no focal motor/sensory deficits  LABORATORY DATA:  I have reviewed the data as listed  . CBC Latest Ref Rng & Units 06/01/2020 02/16/2020 11/25/2019  WBC 4.0 - 10.5 K/uL 7.1 6.9 7.7  Hemoglobin 13.0 - 17.0 g/dL 12.4(L) 11.9(L) 12.4(L)  Hematocrit 39 - 52 % 39.4 36.2(L) 39.8  Platelets 150 - 400 K/uL 175 187.0 204    . CMP Latest Ref Rng & Units 06/01/2020 02/16/2020 11/25/2019  Glucose 70 - 99 mg/dL 131(H) 171(H) 101(H)  BUN 8 - 23 mg/dL 17 23 24(H)  Creatinine 0.61 - 1.24 mg/dL 1.10 1.03 1.07  Sodium 135 - 145 mmol/L 140 137 142  Potassium 3.5 - 5.1 mmol/L 5.8(H) 4.8 5.3(H)  Chloride 98 - 111 mmol/L 103 102 105  CO2 22 - 32 mmol/L _0 Calcium 8.9 - 10.3 mg/dL 9.9 9.5 9.8  Total Protein 6.5 - 8.1 g/dL 7.3 6.9 7.6  Total Bilirubin 0.3 - 1.2 mg/dL 0.8 0.7 0.7  Alkaline Phos 38 - 126 U/L 47 40 44  AST 15 - 41 U/L _1 ALT 0 - 44 U/L _2 11/25/2019 Flow Pathology Report ([id number]):   Component     Latest Ref Rng & Units 08/06/2019  Lymphocytes     12.0 - 46.0 % 57.6 (H)  Lymphocyte #     0.7 - 4.0 K/uL 4.7 (H)   Component     Latest Ref Rng  & Units 11/12/2019  Lymphocytes     12.0 - 46.0 % 61.1 abnormal cells  noted on smear referred for pathology review. (H)  Lymphocyte #     0.7 - 4.0 K/uL 5.6 (H)    RADIOGRAPHIC STUDIES: I have personally reviewed the radiological images as listed and agreed with the findings in the report. No results found.  ASSESSMENT & PLAN:   86 yo with   1) Lymphocytosis -- likely monoclonal B lymphocytosis 11/25/2019 Flow Pathology Report ([id number]) revealed "Monoclonal B-cell population identified" 11/25/2019 FISH Panel revealed "Mono-allelic deletion of [id number] (13q14.3) locus is detected."  PLAN: -Discussed pt labwork today, 06/01/20; WBC, Hgb, & PLT are nml, hyperkalemia, other blood chemistries are nml, LDH is WNL -Discussed 03/31/2020 MRI Brain ([id number]) - Pt will f/u with Neurology -Recommend pt discontinue PO potassium supplement immediately and reduce dietary potassium as much as possible.  -Advised pt that high potassium levels can cause cardiac dysfunction. -No lab or clinical evidence of Monoclonal B-cell Lymphocytosis progression  at this time.  -No indication for more aggressive testing at this time. Will watch with labs and clinic examination.  -Recommend pt f/u with PCP in 1-2 weeks for potassium recheck -Will see back in 12 months with labs    FOLLOW  UP: RTC with Dr Irene Limbo with labs in 12 months F/u with PCP in 1-2 weeks for rpt labs to monitor potassium levels   The total time spent in the appt was 20 minutes and more than 50% was on counseling and direct patient cares.  All of the patient's questions were answered with apparent satisfaction. The patient knows to call the clinic with any problems, questions or concerns.    Sullivan Lone MD [place] AAHIVMS [facility] Hematology/Oncology Physician [facility]  (Office):       [phone] (Work cell):  [phone] (Fax):           [phone]  06/01/2020 11:20 AM   I, Yevette Edwards, am acting as a  scribe for Dr. Sullivan Lone.   .I have reviewed the above documentation for accuracy and completeness, and I agree with the above. Brunetta Genera MD

## 2020-06-01 ENCOUNTER — Telehealth: Payer: Self-pay | Admitting: Family Medicine

## 2020-06-01 ENCOUNTER — Inpatient Hospital Stay: Payer: Medicare PPO | Attending: Hematology | Admitting: Hematology

## 2020-06-01 ENCOUNTER — Encounter: Payer: Self-pay | Admitting: Family Medicine

## 2020-06-01 ENCOUNTER — Encounter: Payer: Self-pay | Admitting: Neurology

## 2020-06-01 ENCOUNTER — Other Ambulatory Visit: Payer: Self-pay

## 2020-06-01 ENCOUNTER — Inpatient Hospital Stay: Payer: Medicare PPO

## 2020-06-01 VITALS — BP 115/62 | HR 69 | Temp 97.7°F | Resp 18 | Ht 70.0 in | Wt 185.8 lb

## 2020-06-01 DIAGNOSIS — Z86718 Personal history of other venous thrombosis and embolism: Secondary | ICD-10-CM | POA: Insufficient documentation

## 2020-06-01 DIAGNOSIS — E119 Type 2 diabetes mellitus without complications: Secondary | ICD-10-CM | POA: Diagnosis not present

## 2020-06-01 DIAGNOSIS — I2510 Atherosclerotic heart disease of native coronary artery without angina pectoris: Secondary | ICD-10-CM | POA: Insufficient documentation

## 2020-06-01 DIAGNOSIS — D72820 Lymphocytosis (symptomatic): Secondary | ICD-10-CM

## 2020-06-01 DIAGNOSIS — I998 Other disorder of circulatory system: Secondary | ICD-10-CM | POA: Insufficient documentation

## 2020-06-01 DIAGNOSIS — C9110 Chronic lymphocytic leukemia of B-cell type not having achieved remission: Secondary | ICD-10-CM

## 2020-06-01 DIAGNOSIS — E875 Hyperkalemia: Secondary | ICD-10-CM

## 2020-06-01 DIAGNOSIS — Z79899 Other long term (current) drug therapy: Secondary | ICD-10-CM | POA: Insufficient documentation

## 2020-06-01 DIAGNOSIS — N3281 Overactive bladder: Secondary | ICD-10-CM | POA: Insufficient documentation

## 2020-06-01 DIAGNOSIS — Z8249 Family history of ischemic heart disease and other diseases of the circulatory system: Secondary | ICD-10-CM | POA: Insufficient documentation

## 2020-06-01 DIAGNOSIS — Z83438 Family history of other disorder of lipoprotein metabolism and other lipidemia: Secondary | ICD-10-CM | POA: Insufficient documentation

## 2020-06-01 LAB — CBC WITH DIFFERENTIAL/PLATELET
Abs Immature Granulocytes: 0.03 10*3/uL (ref 0.00–0.07)
Basophils Absolute: 0 10*3/uL (ref 0.0–0.1)
Basophils Relative: 0 %
Eosinophils Absolute: 0.1 10*3/uL (ref 0.0–0.5)
Eosinophils Relative: 1 %
HCT: 39.4 % (ref 39.0–52.0)
Hemoglobin: 12.4 g/dL — ABNORMAL LOW (ref 13.0–17.0)
Immature Granulocytes: 0 %
Lymphocytes Relative: 49 %
Lymphs Abs: 3.5 10*3/uL (ref 0.7–4.0)
MCH: 29.7 pg (ref 26.0–34.0)
MCHC: 31.5 g/dL (ref 30.0–36.0)
MCV: 94.3 fL (ref 80.0–100.0)
Monocytes Absolute: 0.3 10*3/uL (ref 0.1–1.0)
Monocytes Relative: 5 %
Neutro Abs: 3.2 10*3/uL (ref 1.7–7.7)
Neutrophils Relative %: 45 %
Platelets: 175 10*3/uL (ref 150–400)
RBC: 4.18 MIL/uL — ABNORMAL LOW (ref 4.22–5.81)
RDW: 14.2 % (ref 11.5–15.5)
WBC: 7.1 10*3/uL (ref 4.0–10.5)
nRBC: 0 % (ref 0.0–0.2)

## 2020-06-01 LAB — CMP (CANCER CENTER ONLY)
ALT: 22 U/L (ref 0–44)
AST: 26 U/L (ref 15–41)
Albumin: 4.2 g/dL (ref 3.5–5.0)
Alkaline Phosphatase: 47 U/L (ref 38–126)
Anion gap: 9 (ref 5–15)
BUN: 17 mg/dL (ref 8–23)
CO2: 28 mmol/L (ref 22–32)
Calcium: 9.9 mg/dL (ref 8.9–10.3)
Chloride: 103 mmol/L (ref 98–111)
Creatinine: 1.1 mg/dL (ref 0.61–1.24)
GFR, Est AFR Am: >60 mL/min (ref >60)
GFR, Estimated: >60 mL/min (ref >60)
Glucose, Bld: 131 mg/dL — ABNORMAL HIGH (ref 70–99)
Potassium: 5.8 mmol/L — ABNORMAL HIGH (ref 3.5–5.1)
Sodium: 140 mmol/L (ref 135–145)
Total Bilirubin: 0.8 mg/dL (ref 0.3–1.2)
Total Protein: 7.3 g/dL (ref 6.5–8.1)

## 2020-06-01 LAB — LACTATE DEHYDROGENASE: LDH: 181 U/L (ref 98–192)

## 2020-06-01 NOTE — Telephone Encounter (Signed)
Pt call and  Need a call back about his potassium

## 2020-06-01 NOTE — Telephone Encounter (Signed)
He saw oncology today and they did bloodwork which revealed a high potassium. It looks like they discussed plan for follow up in office?  They advised stopping potassium supplement (I don't know that he was on one, or if he was we didn't have it recorded**); avoiding high potassium foods, and then coming to [place] for a recheck of potassium levels.   His ongoing use of insulin as well as lasix should help lower levels. I would recommend low potassium foods as well. I would suggest recheck bmp on Friday if he was NOT taking a supplement containing potassium or sometime next week if he was taking supplement but is now holding that. Please order bmp and set up lab visit for him.

## 2020-06-02 ENCOUNTER — Other Ambulatory Visit: Payer: Self-pay | Admitting: Family Medicine

## 2020-06-02 NOTE — Telephone Encounter (Signed)
Spoke with the pts wife and she stated the Rx was discussed at the visit yesterday and Dr Ethlyn Gallery sent a Mychart message to her also.  Lab appt scheduled for 6/25.

## 2020-06-03 ENCOUNTER — Telehealth: Payer: Self-pay | Admitting: Hematology

## 2020-06-03 ENCOUNTER — Other Ambulatory Visit: Payer: Self-pay

## 2020-06-03 NOTE — Telephone Encounter (Signed)
Scheduled per 06/21 los, patient has been called and notified. 

## 2020-06-04 ENCOUNTER — Other Ambulatory Visit (INDEPENDENT_AMBULATORY_CARE_PROVIDER_SITE_OTHER): Payer: Medicare PPO

## 2020-06-04 DIAGNOSIS — E875 Hyperkalemia: Secondary | ICD-10-CM

## 2020-06-04 LAB — BASIC METABOLIC PANEL
BUN: 20 mg/dL (ref 6–23)
CO2: 29 mEq/L (ref 19–32)
Calcium: 9.8 mg/dL (ref 8.4–10.5)
Chloride: 102 mEq/L (ref 96–112)
Creatinine, Ser: 0.89 mg/dL (ref 0.40–1.50)
GFR: 81.31 mL/min (ref >60.00)
Glucose, Bld: 72 mg/dL (ref 70–99)
Potassium: 4.8 mEq/L (ref 3.5–5.1)
Sodium: 139 mEq/L (ref 135–145)

## 2020-06-09 DIAGNOSIS — G5623 Lesion of ulnar nerve, bilateral upper limbs: Secondary | ICD-10-CM | POA: Diagnosis not present

## 2020-06-09 DIAGNOSIS — G5603 Carpal tunnel syndrome, bilateral upper limbs: Secondary | ICD-10-CM | POA: Diagnosis not present

## 2020-06-21 ENCOUNTER — Other Ambulatory Visit: Payer: Self-pay

## 2020-06-21 ENCOUNTER — Ambulatory Visit: Payer: Medicare PPO | Admitting: Family Medicine

## 2020-06-21 ENCOUNTER — Encounter: Payer: Self-pay | Admitting: Family Medicine

## 2020-06-21 VITALS — BP 100/60 | HR 60 | Temp 97.7°F | Ht 70.0 in | Wt 185.1 lb

## 2020-06-21 DIAGNOSIS — I2510 Atherosclerotic heart disease of native coronary artery without angina pectoris: Secondary | ICD-10-CM

## 2020-06-21 DIAGNOSIS — M5030 Other cervical disc degeneration, unspecified cervical region: Secondary | ICD-10-CM | POA: Diagnosis not present

## 2020-06-21 DIAGNOSIS — E1169 Type 2 diabetes mellitus with other specified complication: Secondary | ICD-10-CM

## 2020-06-21 DIAGNOSIS — I2583 Coronary atherosclerosis due to lipid rich plaque: Secondary | ICD-10-CM

## 2020-06-21 DIAGNOSIS — K219 Gastro-esophageal reflux disease without esophagitis: Secondary | ICD-10-CM

## 2020-06-21 DIAGNOSIS — E611 Iron deficiency: Secondary | ICD-10-CM

## 2020-06-21 DIAGNOSIS — I10 Essential (primary) hypertension: Secondary | ICD-10-CM | POA: Diagnosis not present

## 2020-06-21 DIAGNOSIS — E785 Hyperlipidemia, unspecified: Secondary | ICD-10-CM

## 2020-06-21 DIAGNOSIS — I5032 Chronic diastolic (congestive) heart failure: Secondary | ICD-10-CM

## 2020-06-21 DIAGNOSIS — E1142 Type 2 diabetes mellitus with diabetic polyneuropathy: Secondary | ICD-10-CM | POA: Diagnosis not present

## 2020-06-21 DIAGNOSIS — E1159 Type 2 diabetes mellitus with other circulatory complications: Secondary | ICD-10-CM | POA: Diagnosis not present

## 2020-06-21 DIAGNOSIS — Z794 Long term (current) use of insulin: Secondary | ICD-10-CM

## 2020-06-21 MED ORDER — AMLODIPINE BESYLATE 5 MG PO TABS: 2.5000 mg | ORAL_TABLET | Freq: Every day | ORAL | 0 refills | Status: DC

## 2020-06-21 NOTE — Progress Notes (Signed)
[name] DOB: 1932-12-12 Encounter date: 06/21/2020  This is a 86 y.o. male who presents with Chief Complaint  Patient presents with  . Follow-up    History of present illness: Last visit was in March, and at that time he had multiple medical complaints including concerns with worsening of balance, neck pain, bilateral arm weakness and neuropathy, and memory deficit.  MRI of the brain revealed mild chronic small vessel ischemic disease with progression, generalized parenchymal atrophy (stable), and ligamentous hypertrophy at the skull base and C1-C2 articulation with narrowing of the craniocervical junction and mild mass-effect on the ventral medulla and upper cervical cord.  Patient was already following with neurosurgery and was referred to neurology due to MRI changes and previous small infarcts noted on MRI.  Neurology appointment has been scheduled for 7/15. Does feel weak with more activity. Doesn't feel like balance is getting worse; states that in fact around house he will sometimes walk without walker.   Wife states that memory is stable to improved from last visit. Isolated moments of confusion, but doing a little better overall between conversations, etc.   His hands have gotten to point of being numb/going to sleep. Saw Dr. Arnoldo Morale - having surgery in August and getting each arm operated on - one at a time - they are working on ulnar nerve. He did have a nerve ablation done in neck.   Still having some stinging in right ear s/p cryotherapy.   Potassium level is elevated on recent blood work June 22 with oncology.  Follow-up BMP revealed a normal potassium.  HTN: blood pressure is staying low. Last night 128/65. Usually not over 854'O systolic.   CAD/CHF diastolic: follows with cardiology - bp and cholesterol controlled.    GERD: takes the omeprazole which keeps sx away. If stops medicine then sx restart.   DMII: follows with Dr. Cruzita Lederer.  Most recent visit was March  2021.  She suggested close monitoring for hypoglycemia at that time, with continuation of Levemir at 16 units at night and Humalog for mealtime coverage with a decrease sliding scale. States that sugars have been staying pretty well - more consistent.   HL: on the pravastatin 45m daily.  No problems tolerating medication.  OAB: follows with urology. Didn't get relief with myrbetriq (tried on two occasions - last was 6 months ago).   Does see derm at surgical center  Lymphocytosis:  Has been stable, follows with oncology.   Allergies  Allergen Reactions  . Ciprofloxacin Other (See Comments)    "made me have a weird feeling. Disoriented."   . Sulfamethoxazole Rash   Current Meds  Medication Sig  . Accu-Chek Softclix Lancets lancets Use to check blood sugar 5 times a day.  . Acetaminophen (TYLENOL ARTHRITIS PAIN PO) Take 625 mg by mouth 3 (three) times daily.   .Marland KitchenamLODipine (NORVASC) 5 MG tablet TAKE ONE TABLET BY MOUTH DAILY  . aspirin 81 MG tablet Take 81 mg by mouth daily.   . B-D UF III MINI PEN NEEDLES 31G X 5 MM MISC USE TO TEST BLOOD SUGAR FOUR TIMES A DAY  . Blood Glucose Monitoring Suppl (ACCU-CHEK GUIDE) w/Device KIT 1 kit by Does not apply route See admin instructions. Check blood sugar 3 times a day  . ferrous sulfate 325 (65 FE) MG tablet Take 325 mg by mouth daily with breakfast.  . furosemide (LASIX) 40 MG tablet Take 1 tablet (40 mg total) by mouth daily.  .Marland Kitchenglucose blood (ACCU-CHEK GUIDE)  test strip Use to check blood sugar 3 times a day  . HUMALOG KWIKPEN 100 UNIT/ML KwikPen INJECT 6 TO 10 UNITS INTO THE SKIN BEFORE EACH MEAL THREE TIMES A DAY  . Insulin Detemir (LEVEMIR FLEXTOUCH) 100 UNIT/ML Pen INJECT 20 UNITS INTO THE SKIN DAILY AT 10 PM  . Magnesium 200 MG TABS Take 1 tablet (200 mg total) by mouth daily.  . metFORMIN (GLUCOPHAGE) 1000 MG tablet TAKE ONE TABLET BY MOUTH TWICE A DAY WITH MEALS  . Multiple Vitamin (MULTIVITAMIN ADULT PO) Take by mouth.  .  Multiple Vitamins-Minerals (CENTRUM SILVER PO) Take 1 tablet by mouth daily.   Marland Kitchen omeprazole (PRILOSEC) 20 MG capsule TAKE ONE CAPSULE BY MOUTH DAILY  . OVER THE COUNTER MEDICATION   . pravastatin (PRAVACHOL) 40 MG tablet TAKE ONE TABLET BY MOUTH EVERY EVENING  . Probiotic Product (PROBIOTIC PO) Take by mouth.  . Propylene Glycol (SYSTANE BALANCE) 0.6 % SOLN Apply to eye. 2 drops each eye once daily  . vitamin B-12 (CYANOCOBALAMIN) 1000 MCG tablet Take 1,000 mcg by mouth daily.    Review of Systems  Constitutional: Negative for activity change, appetite change, chills, fatigue, fever and unexpected weight change.  HENT: Negative for congestion, ear pain, hearing loss, sinus pressure, sinus pain, sore throat and trouble swallowing.   Eyes: Negative for pain and visual disturbance.  Respiratory: Negative for cough, chest tightness, shortness of breath and wheezing.   Cardiovascular: Negative for chest pain, palpitations and leg swelling.  Gastrointestinal: Negative for abdominal distention, abdominal pain, blood in stool, constipation, diarrhea, nausea and vomiting.  Genitourinary: Negative for decreased urine volume, difficulty urinating, dysuria, penile pain and testicular pain.  Musculoskeletal: Negative for arthralgias, back pain and joint swelling.  Skin: Negative for rash.  Neurological: Negative for dizziness, weakness, numbness and headaches.  Hematological: Negative for adenopathy. Does not bruise/bleed easily.  Psychiatric/Behavioral: Negative for agitation, sleep disturbance and suicidal ideas. The patient is not nervous/anxious.     Objective:  BP 100/60 (BP Location: Left Arm, Patient Position: Sitting, Cuff Size: Normal)   Pulse 60   Temp 97.7 F (36.5 C) (Temporal)   Ht 5' 10"  (1.778 m)   Wt 185 lb 1.6 oz (84 kg)   BMI 26.56 kg/m   Weight: 185 lb 1.6 oz (84 kg)   BP Readings from Last 3 Encounters:  06/21/20 100/60  06/01/20 115/62  02/25/20 (!) 142/80   Wt  Readings from Last 3 Encounters:  06/21/20 185 lb 1.6 oz (84 kg)  06/01/20 185 lb 12.8 oz (84.3 kg)  02/25/20 184 lb (83.5 kg)    Physical Exam Constitutional:      General: He is not in acute distress.    Appearance: He is well-developed.  Cardiovascular:     Rate and Rhythm: Normal rate and regular rhythm.     Heart sounds: Murmur heard.  Systolic murmur is present with a grade of 2/6.  No friction rub.  Pulmonary:     Effort: Pulmonary effort is normal. No respiratory distress.     Breath sounds: Normal breath sounds. No wheezing or rales.  Musculoskeletal:     Right lower leg: No edema.     Left lower leg: No edema.  Neurological:     Mental Status: He is alert and oriented to person, place, and time.  Psychiatric:        Behavior: Behavior normal.     Assessment/Plan  1. Hypertension associated with diabetes ([place]) Blood pressure somewhat low today.  I  am going to decrease his amlodipine to half tablet daily.  Encouraged him to monitor at home.  Given balance issues, I feel it is best to not have him be hypotensive. - CBC with Differential/Platelet; Future - Comprehensive metabolic panel; Future - amLODipine (NORVASC) 5 MG tablet; Take 0.5 tablets (2.5 mg total) by mouth daily.  Dispense: 90 tablet; Refill: 0  2. Chronic diastolic congestive heart failure ([place]) Follows with cardiology regularly.  Has been stable.  Blood pressure and cholesterol are well controlled. No edema, sob.  3. Coronary artery disease due to lipid rich plaque Cholesterols been well controlled.  We will plan to recheck lipids in 3 months time.  4. Gastroesophageal reflux disease, unspecified whether esophagitis present Well-controlled on omeprazole.  Continue current medication.  5. Type 2 diabetes mellitus with diabetic polyneuropathy, with long-term current use of insulin ([place]) Follows with endocrinology.  Sugars have been well controlled.  He is monitoring closely. - Hemoglobin A1c;  Future  6. DDD (degenerative disc disease), cervical Following with neurosurgery.  Considered stable from their standpoint.  7. Hyperlipidemia associated with type 2 diabetes mellitus ([place]) - Lipid panel; Future   Return in about 3 months (around 09/21/2020) for bloodwork followed by Chronic condition visit.    Micheline Rough, MD

## 2020-06-21 NOTE — Patient Instructions (Addendum)
Decrease amlodipine to half tab daily (2.'5mg'$ ) and keep tracking blood pressure.    Adult Exercise Classes   1. [place] [employer]: [email or web address].gov [phone]) a. All classes are free, but you have to register. Zoom classes are offered. Variety includes yoga, chair yoga, arthritis classes, Tai Chi, line dancing, balance classes, boot camp, and more!   2. [place] a. Engineer, manufacturing resources from [employer]" (gathering place for friends with dementia and caregivers to have social time, coffee, treats) to exercise classes. All free! 3. [employer]  a. Daily chair exercises/Yoga classes

## 2020-06-24 ENCOUNTER — Ambulatory Visit: Payer: Medicare PPO | Admitting: Neurology

## 2020-06-24 ENCOUNTER — Encounter: Payer: Self-pay | Admitting: Neurology

## 2020-06-24 ENCOUNTER — Other Ambulatory Visit: Payer: Self-pay

## 2020-06-24 VITALS — BP 152/75 | HR 70 | Ht 70.0 in | Wt 184.0 lb

## 2020-06-24 DIAGNOSIS — G3184 Mild cognitive impairment, so stated: Secondary | ICD-10-CM

## 2020-06-24 MED ORDER — DONEPEZIL HCL 5 MG PO TABS: ORAL_TABLET | ORAL | 11 refills | Status: AC

## 2020-06-24 NOTE — Patient Instructions (Signed)
1. Schedule Neurocognitive testing  2. Start Donepezil 5mg : Take 1/2 tablet daily for 2 weeks, then increase to 1 tablet daily  3. Follow-up in 6 months, call for any changes   FALL PRECAUTIONS: Be cautious when walking. Scan the area for obstacles that may increase the risk of trips and falls. When getting up in the mornings, sit up at the edge of the bed for a few minutes before getting out of bed. Consider elevating the bed at the head end to avoid drop of blood pressure when getting up. Walk always in a well-lit room (use night lights in the walls). Avoid area rugs or power cords from appliances in the middle of the walkways. Use a walker or a cane if necessary and consider physical therapy for balance exercise. Get your eyesight checked regularly.  FINANCIAL OVERSIGHT: Supervision, especially oversight when making financial decisions or transactions is also recommended as difficulties arise.  HOME SAFETY: Consider the safety of the kitchen when operating appliances like stoves, microwave oven, and blender. Consider having supervision and share cooking responsibilities until no longer able to participate in those. Accidents with firearms and other hazards in the house should be identified and addressed as well.  DRIVING: Regarding driving, in patients with progressive memory problems, driving will be impaired. We advise to have someone else do the driving if trouble finding directions or if minor accidents are reported. Independent driving assessment is available to determine safety of driving.  ABILITY TO BE LEFT ALONE: If patient is unable to contact 911 operator, consider using LifeLine, or when the need is there, arrange for someone to stay with patients. Smoking is a fire hazard, consider supervision or cessation. Risk of wandering should be assessed by caregiver and if detected at any point, supervision and safe proof recommendations should be instituted.  MEDICATION SUPERVISION: Inability  to self-administer medication needs to be constantly addressed. Implement a mechanism to ensure safe administration of the medications.  RECOMMENDATIONS FOR ALL PATIENTS WITH MEMORY PROBLEMS: 1. Continue to exercise (Recommend 30 minutes of walking everyday, or 3 hours every week) 2. Increase social interactions - continue going to [place] and enjoy social gatherings with friends and family 3. Eat healthy, avoid fried foods and eat more fruits and vegetables 4. Maintain adequate blood pressure, blood sugar, and blood cholesterol level. Reducing the risk of stroke and cardiovascular disease also helps promoting better memory. 5. Avoid stressful situations. Live a simple life and avoid aggravations. Organize your time and prepare for the next day in anticipation. 6. Sleep well, avoid any interruptions of sleep and avoid any distractions in the bedroom that may interfere with adequate sleep quality 7. Avoid sugar, avoid sweets as there is a strong link between excessive sugar intake, diabetes, and cognitive impairment The Mediterranean diet has been shown to help patients reduce the risk of progressive memory disorders and reduces cardiovascular risk. This includes eating fish, eat fruits and green leafy vegetables, nuts like almonds and hazelnuts, walnuts, and also use olive oil. Avoid fast foods and fried foods as much as possible. Avoid sweets and sugar as sugar use has been linked to worsening of memory function.  There is always a concern of gradual progression of memory problems. If this is the case, then we may need to adjust level of care according to patient needs. Support, both to the patient and caregiver, should then be put into place.
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
## 2020-07-01 ENCOUNTER — Encounter: Payer: Self-pay | Admitting: Family Medicine

## 2020-07-01 ENCOUNTER — Other Ambulatory Visit: Payer: Self-pay | Admitting: Family Medicine

## 2020-07-01 NOTE — Addendum Note (Signed)
Addended by: Caren Macadam on: 07/01/2020 08:20 PM   Modules accepted: Orders

## 2020-07-06 ENCOUNTER — Ambulatory Visit: Payer: Medicare PPO | Admitting: Psychology

## 2020-07-06 ENCOUNTER — Other Ambulatory Visit: Payer: Self-pay

## 2020-07-06 ENCOUNTER — Encounter: Payer: Self-pay | Admitting: Psychology

## 2020-07-06 ENCOUNTER — Ambulatory Visit (INDEPENDENT_AMBULATORY_CARE_PROVIDER_SITE_OTHER): Payer: Medicare PPO | Admitting: Psychology

## 2020-07-06 DIAGNOSIS — I6381 Other cerebral infarction due to occlusion or stenosis of small artery: Secondary | ICD-10-CM | POA: Diagnosis not present

## 2020-07-06 DIAGNOSIS — G3184 Mild cognitive impairment, so stated: Secondary | ICD-10-CM

## 2020-07-06 DIAGNOSIS — R4189 Other symptoms and signs involving cognitive functions and awareness: Secondary | ICD-10-CM

## 2020-07-06 NOTE — Progress Notes (Signed)
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 Exceptionally Low    List Recognition 17/20 10-16 Below Average    Story Recall 7 16 Below Average    Story Recognition 11/12 68-86 Average    Figure Recall 7 16 Below Average    Figure Recognition 7/8 69-83 Average       Intellectual Functioning:           Standard Score Percentile   Test of Premorbid Functioning: 89 23 Below Average       Attention/Executive Function:          Trail Making Test (TMT): Raw Score (T Score) Percentile     Part A 62 secs.,  0 errors (40) 16 Below Average    Part B 291 secs.,  1 error (34) 5 Well Below Average         Scaled Score Percentile   WAIS-IV Digit Span: 7 16 Below Average    Forward 8 25 Average    Backward 10 50 Average    Sequencing 5 5 Well Below Average       D-KEFS Color-Word Interference Test: Raw Score (Scaled Score) Percentile     Color Naming 39 secs. (9) 37 Average    Word Reading 26 secs. (10) 50 Average    Inhibition Discontinued --- Impaired    Inhibition/Switching Discontinued --- Impaired       NAB Executive Functions Module, Form 1: T Score Percentile     Judgment 60 84 Above Average        Language:          Verbal Fluency Test: Raw Score (Scaled Score) Percentile     Phonemic Fluency (CFL) 16 (6) 9 Below Average    Category Fluency 24 (6) 9 Below Average  *Based on [employer] Older Normative Studies (MOANS)          NAB Language Module, Form 1: T Score Percentile     Auditory Comprehension 41 18 Below Average  Naming 28/31 (47) 38 Average       Visuospatial/Visuoconstruction:      Raw Score Percentile   Clock Drawing: 8/10 --- Within Normal Limits       NAB Spatial Module, Form 1: T Score Percentile     Visual Discrimination 34 5 Well Below Average        Scaled Score Percentile   WAIS-IV Block Design: 10 50 Average  WAIS-IV Matrix Reasoning: 7 16 Below Average       Mood and Personality:      Raw Score Percentile   Geriatric Depression Scale: 12 --- Mild  Geriatric Anxiety Scale: 18 --- Mild    Somatic 6 --- Mild    Cognitive 5 --- Mild    Affective 7 --- Moderate       Additional Questionnaires:      Raw Score Percentile   PROMIS Sleep Disturbance Questionnaire: 10 --- None to Slight   Informed Consent and Coding/Compliance:   [name] was provided with a verbal description of the nature and purpose of the present neuropsychological evaluation. Also reviewed were the foreseeable risks and/or discomforts and benefits of the procedure, limits of confidentiality, and mandatory reporting requirements of this provider. The patient was given the opportunity to ask questions and receive answers about the evaluation. Oral consent to participate was provided by the patient.   This evaluation was conducted by Christia Reading, Ph.D., licensed clinical neuropsychologist. [name] completed a comprehensive clinical interview with Dr. Melvyn Novas, billed as one unit [phone], and 135 minutes of cognitive testing and scoring, billed as one unit [phone] and four additional units 96139. Psychometrist Milana Kidney, B.S., assisted Dr. Melvyn Novas with test administration and scoring  procedures. As a separate and discrete service, Dr. Melvyn Novas spent a total of 160 minutes in interpretation and report writing billed as one unit [phone] and two units 96133.

## 2020-07-06 NOTE — Progress Notes (Signed)
   Psychometrician Note   Cognitive testing was administered to [name] by Milana Kidney, B.S. (psychometrist) under the supervision of Dr. Christia Reading, Ph.D., licensed psychologist on 07/06/20. [name] did not appear overtly distressed by the testing session per behavioral observation or responses across self-report questionnaires. Dr. Christia Reading, Ph.D. checked in with [name] as needed to manage any distress related to testing procedures (if applicable). Rest breaks were offered.    The battery of tests administered was selected by Dr. Christia Reading, Ph.D. with consideration to [name]'s current level of functioning, the nature of his symptoms, emotional and behavioral responses during interview, level of literacy, observed level of motivation/effort, and the nature of the referral question. This battery was communicated to the psychometrist. Communication between Dr. Christia Reading, Ph.D. and the psychometrist was ongoing throughout the evaluation and Dr. Christia Reading, Ph.D. was immediately accessible at all times. Dr. Christia Reading, Ph.D. provided supervision to the psychometrist on the date of this service to the extent necessary to assure the quality of all services provided.    [name] will return within approximately 1-2 weeks for an interactive feedback session with Dr. Melvyn Novas at which time his test performances, clinical impressions, and treatment recommendations will be reviewed in detail. [name] understands he can contact our office should he require our assistance before this time.  A total of 135 minutes of billable time were spent face-to-face with [name] by the psychometrist. This includes both test administration and scoring time. Billing for these services is reflected in the clinical report generated by Dr. Christia Reading, Ph.D..  This note reflects time spent with the psychometrician and does not include test scores or any clinical  interpretations made by Dr. Melvyn Novas. The full report will follow in a separate note.

## 2020-07-07 ENCOUNTER — Encounter: Payer: Self-pay | Admitting: Psychology

## 2020-07-07 DIAGNOSIS — G3184 Mild cognitive impairment, so stated: Secondary | ICD-10-CM | POA: Insufficient documentation

## 2020-07-07 DIAGNOSIS — I6381 Other cerebral infarction due to occlusion or stenosis of small artery: Secondary | ICD-10-CM | POA: Insufficient documentation

## 2020-07-07 HISTORY — DX: Mild cognitive impairment of uncertain or unknown etiology: G31.84

## 2020-07-07 NOTE — Patient Instructions (Signed)
Clinical Impression(s): Mr. [name] pattern of performance is suggestive of consistent weaknesses across executive functioning and encoding (i.e., learning) aspects of memory. Performance variability was also exhibited across complex attention/concentration, visuospatial abilities, and retrieval aspects of memory. Performance was appropriate across processing speed, basic attention, safety and judgment, receptive and expressive language, and consolidation aspects of memory. Mr. [name] largely denied difficulties completing instrumental activities of daily living (ADLs) independently. As such, given evidence for cognitive dysfunction described above, he meets criteria for a Mild Neurocognitive Disorder (formerly "mild cognitive impairment") at the present time. However, given his wife's report of some concerns surrounding ADLs, he is likely towards the moderate-severe end of this spectrum.   While the etiology of his cognitive weaknesses is unclear, there are two main culprits which should remain on his differential. Firstly, weaknesses could be due to a primary vascular etiology. Mr. [name] has several conditions known to influence cerebrovascular functioning (e.g., type II diabetes, hypertension, hyperlipidemia, congestive heart failure) and previous neuroimaging revealed two remote lacunar infarcts, as well as progressive small vessel ischemic changes. Cognitive deficits in executive functioning and variability across memory and other abilities would be consistent with this presentation. Despite this, I cannot rule out the presence of an underlying neurodegenerative condition, namely Lewy body dementia. Cognitive performances, especially weaknesses across executive functioning and visuospatial abilities are consistent with this presentation. He also endorsed would could represent disordered REM sleep. He did not endorse other common behavioral symptoms of this condition, namely fully-formed visual  hallucinations or parkinsonisms. This could suggest that this condition is less likely; however, the full constellation of classic Lewy body dementia symptoms is not always present in clinical cases. Improved retrieval aspects of memory (relative to encoding performances) and intact consolidation scores are not concerning for Alzheimer's disease. Continued medical monitoring will be important moving forward.

## 2020-07-09 ENCOUNTER — Ambulatory Visit: Payer: Medicare PPO | Admitting: Neurology

## 2020-07-12 ENCOUNTER — Telehealth: Payer: Self-pay | Admitting: Family Medicine

## 2020-07-12 DIAGNOSIS — R351 Nocturia: Secondary | ICD-10-CM | POA: Diagnosis not present

## 2020-07-12 DIAGNOSIS — N3941 Urge incontinence: Secondary | ICD-10-CM | POA: Diagnosis not present

## 2020-07-12 DIAGNOSIS — N401 Enlarged prostate with lower urinary tract symptoms: Secondary | ICD-10-CM | POA: Diagnosis not present

## 2020-07-12 NOTE — Telephone Encounter (Signed)
I like that number better than what he had here in office last (systolic 953 at that time). Make sure med list reflects what he is currently taking, and if feeling ok I would advise to continue current meds.

## 2020-07-12 NOTE — Telephone Encounter (Signed)
Spoke with the pt and he stated he is feeling fine, was taking 1/2 tablet of Amlodipine 5mg  daily and restarted taking a whole pill last night.  Patient agreed to call back if needed.

## 2020-07-12 NOTE — Telephone Encounter (Signed)
Pt stated that each time he checked his blood pressure it was at  8:45 am 135/62  Wants to discuss his readings with provider or CMA  Please advise

## 2020-07-14 DIAGNOSIS — G5602 Carpal tunnel syndrome, left upper limb: Secondary | ICD-10-CM | POA: Diagnosis not present

## 2020-07-14 DIAGNOSIS — G5622 Lesion of ulnar nerve, left upper limb: Secondary | ICD-10-CM | POA: Diagnosis not present

## 2020-07-15 ENCOUNTER — Ambulatory Visit (INDEPENDENT_AMBULATORY_CARE_PROVIDER_SITE_OTHER): Payer: Medicare PPO | Admitting: Psychology

## 2020-07-15 ENCOUNTER — Other Ambulatory Visit: Payer: Self-pay

## 2020-07-15 DIAGNOSIS — G3184 Mild cognitive impairment, so stated: Secondary | ICD-10-CM | POA: Diagnosis not present

## 2020-07-15 NOTE — Patient Instructions (Signed)
Recommendations: A repeat neuropsychological evaluation in 12-18 months (or sooner if functional decline is noted) is recommended. Vascular and Lewy body dementia presentations would be expected to have disparate progressions with the latter exhibiting more rapid cognitive decline relative to the former. A repeat evaluation would allow for the assessment of the trajectory of future cognitive decline should it occur. This will also aid in future efforts towards improved diagnostic clarity.  [name] has already been recently prescribed donepezil/Aricept to address ongoing memory concerns. I would recommend that he continue with dosing instructions provided by Dr. Delice [name] and monitor for any side effects.   Should there be a progression of his current deficits over time, [name] is unlikely to regain any independent living skills lost. Therefore, it is recommended that he remain as involved as possible in all aspects of household chores, finances, and medication management, with supervision to ensure adequate performance. He will likely benefit from the establishment and maintenance of a routine in order to maximize his functional abilities over time.  If not already done, [name] and his family may want to discuss his wishes regarding durable power of attorney and medical decision making, so that he can have input into these choices. Additionally, they may wish to discuss future plans for caretaking and seek out community options for in home/residential care should they become necessary.  Performance across neurocognitive testing is not a strong predictor of an individual's safety operating a motor vehicle. Despite this, he did exhibit weaknesses in executive functioning and visuospatial abilities which certainly could increase the risk of unsafe driving behaviors. I would recommend he and his family consider pursuing a formalized driving evaluation to ensure his continued safety behind the wheel.  They would be encouraged to [employer] in [place], [place] at [phone].Another option would be through [facility]; however, the latter would likely require a referral from a medical doctor. [facility] can be reached directly at [phone].  [name] is encouraged to attend to lifestyle factors for brain health (e.g., regular physical exercise, good nutrition habits, regular participation in cognitively-stimulating activities, and general stress management techniques), which are likely to have benefits for both emotional adjustment and cognition. Optimal control of vascular risk factors (including safe cardiovascular exercise and adherence to dietary recommendations) is encouraged.  When learning new information, he would benefit from information being broken up into small, manageable pieces. He may also find it helpful to articulate the material in his own words and in a context to promote encoding at the onset of a new task. This material may need to be repeated multiple times to promote encoding.  Memory can be improved using internal strategies such as rehearsal, repetition, chunking, mnemonics, association, and imagery. External strategies such as written notes in a consistently used memory journal, visual and nonverbal auditory cues such as a calendar on the refrigerator or appointments with alarm, such as on a cell phone, can also help maximize recall.    To address problems with complex attention and executive dysfunction, he may wish to consider:   -Avoiding external distractions when needing to concentrate   -Limiting exposure to fast paced environments with multiple sensory demands   -Writing down complicated information and using checklists   -Attempting and completing one task at a time (i.e., no multi-tasking)   -Verbalizing aloud each step of a task to maintain focus   -Reducing the amount of information considered at one time

## 2020-07-15 NOTE — Progress Notes (Signed)
   Neuropsychology Feedback Session Tillie Rung. [facility]  Reason for Referral:   Bobby Thong Wyrickis a 86 y.o. right-handed Caucasian male referred by Ellouise Newer, M.D.,to characterize hiscurrent cognitive functioning and assist with diagnostic clarity and treatment planning in the context of subjective cognitive decline.   Feedback:   Mr. Pirro completed a comprehensive neuropsychological evaluation on 07/06/2020. Please refer to that encounter for the full report and recommendations. Briefly, results suggested consistent weaknesses across executive functioning and encoding (i.e., learning) aspects of memory. Performance variability was also exhibited across complex attention/concentration, visuospatial abilities, and retrieval aspects of memory. While the etiology of his cognitive weaknesses is unclear, there are two main culprits which should remain on his differential. Firstly, weaknesses could be due to a primary vascular etiology. Mr. Kaigler has several conditions known to influence cerebrovascular functioning (e.g., type II diabetes, hypertension, hyperlipidemia, congestive heart failure) and previous neuroimaging revealed two remote lacunar infarcts, as well as progressive small vessel ischemic changes. Cognitive deficits in executive functioning and variability across memory and other abilities would be consistent with this presentation. Despite this, I cannot rule out the presence of an underlying neurodegenerative condition, namely Lewy body dementia. Cognitive performances, especially weaknesses across executive functioning and visuospatial abilities are consistent with this presentation. He also endorsed would could represent disordered REM sleep. He did not endorse other common behavioral symptoms of this condition, namely fully-formed visual hallucinations or parkinsonisms. This could suggest that this condition is less likely.  Mr. Endicott was  accompanied by his wife during the current telephone call. They were within their residence while I was within my office. Content of the current session focused on the results of his neuropsychological evaluation. Mr. Lacock and his wife were given the opportunity to ask questions and their questions were answered. He was encouraged to reach out should additional questions arise. His report is available to him on MyChart.      19 minutes were spent conducting the current feedback session with Mr. Mcartor, billed as one unit [phone].

## 2020-07-27 DIAGNOSIS — G5603 Carpal tunnel syndrome, bilateral upper limbs: Secondary | ICD-10-CM | POA: Diagnosis not present

## 2020-07-27 DIAGNOSIS — G5623 Lesion of ulnar nerve, bilateral upper limbs: Secondary | ICD-10-CM | POA: Diagnosis not present

## 2020-08-12 NOTE — Addendum Note (Signed)
Addended by: Marrion Coy on: 08/12/2020 03:40 PM   Modules accepted: Orders

## 2020-08-25 ENCOUNTER — Ambulatory Visit: Payer: Medicare PPO | Admitting: Internal Medicine

## 2020-08-25 ENCOUNTER — Encounter: Payer: Self-pay | Admitting: Internal Medicine

## 2020-08-25 ENCOUNTER — Other Ambulatory Visit: Payer: Self-pay

## 2020-08-25 VITALS — BP 164/62 | HR 44 | Ht 70.0 in | Wt 183.0 lb

## 2020-08-25 DIAGNOSIS — E785 Hyperlipidemia, unspecified: Secondary | ICD-10-CM | POA: Diagnosis not present

## 2020-08-25 DIAGNOSIS — E1142 Type 2 diabetes mellitus with diabetic polyneuropathy: Secondary | ICD-10-CM

## 2020-08-25 DIAGNOSIS — Z794 Long term (current) use of insulin: Secondary | ICD-10-CM

## 2020-08-25 DIAGNOSIS — E1169 Type 2 diabetes mellitus with other specified complication: Secondary | ICD-10-CM

## 2020-08-25 LAB — POCT GLYCOSYLATED HEMOGLOBIN (HGB A1C): Hemoglobin A1C: 6.6 % — AB (ref 4.0–5.6)

## 2020-08-25 NOTE — Addendum Note (Signed)
Addended by: Cardell Peach I on: 08/25/2020 09:12 AM   Modules accepted: Orders

## 2020-08-25 NOTE — Patient Instructions (Addendum)
Please continue: - Metformin 1000 mg 2x a day - Levemir (14)-16 units at night - Humalog 15 min before a meal:  4-5 units before a small meal 6 units before a regular meal 8 units before a large meal Please subtract 2 units if you plan to be active after a meal.  Please come back for a follow-up appointment in 6 months.

## 2020-08-25 NOTE — Progress Notes (Signed)
Subjective:     Patient ID: [name], male   DOB: [birth year], 86 y.o.   MRN: [id number]  This visit occurred during the SARS-CoV-2 public health emergency.  Safety protocols were in place, including screening questions prior to the visit, additional usage of staff PPE, and extensive cleaning of exam room while observing appropriate contact time as indicated for disinfecting solutions.   HPI Mr. Comes is a very pleasant 86 y.o. man returning for management of DM2, dx 07/2012, uncontrolled, insulin-dependent, with complications (CAD, peripheral neuropathy, PAD). Last visit 6 months ago.  He continues to have weakness in his legs and poor equilibrium after his fall in 09/2015.  He feels that this is getting worse.  He finished physical therapy and also tried acupuncture.  He had an MRI that showed osteoarthritis in his neck and got steroid injections.  He also saw neurosurgery.  He was seeing pain management, but not on steroid injections anymore. In 07/2020, he had carpal tunnel sx. In R hand.  He may need to get this in the left hand, also.  Since last visit, he was also diagnosed with mild cognitive impairment.  Reviewed HbA1c levels: Lab Results  Component Value Date   HGBA1C 6.6 (A) 02/25/2020   HGBA1C 6.5 (A) 09/17/2019   HGBA1C 6.6 (A) 05/12/2019   He is on: - Metformin 1000 mg 2x a day - Levemir 18 >> 16 units at night - Humalog 15 min before a meal:  4-5 units before a small meal 6 units before a regular meal  Please subtract 2 units if you plan to be active after a meal.  I reviewed his log-checks sugars 2-4 times a day: - am: 80-120, 135 >> 75-120 >> 80-110 >> 74-108 >> 82-125 - 2h after b'fast: 108-130 >> n/c >> 110-130 >> 110, 114 >> 140 - before lunch:  96-145, 160 >> 90, 105-160 >> 105-150, 170 - 2h after lunch: 107-152, 180 >> 182 >> 160 >> 135-160 >> 150-165 - dinner: 103-160 (snack) >> 133-175 >> 119-156, 168 >> 123-153, 155 - 2h after dinner:   120, 163 >>  110 >> 107-165 >> n/c - bedtime:  135-167, 170 >> 115-170 >> 88-162 >> 108-165, 170 - nighttime:  115-158 >> 96-145 >> 86-130 >> 125-145, 163 Has hypoglycemia awareness in the 70s.  -No CKD. Last BUN/Cr: Lab Results  Component Value Date   BUN 20 06/04/2020   CREATININE 0.89 06/04/2020  Not on ACE inhibitor/ARB. -+ HL; last lipid panel: Lab Results  Component Value Date   CHOL 121 08/06/2019   HDL 49.90 08/06/2019   LDLCALC 50 08/06/2019   TRIG 104.0 08/06/2019   CHOLHDL 2 08/06/2019  On pravastatin. -+ Numbness and tingling in his feet and numbness in his hands.  He takes B complex daily and uses a compounded neuropathy cream as needed Sees Dr. Barkley Bruns at [facility]. - Last eye exam 10/2019: No DR (Dr. Alois Cliche).  He had cataract surgery in 08/2015.  He has macular degeneration and is on Preservision Areds vitamins. On ASA 81.  He had a heart catheterization on 09/03/2014 >> 2 small Blockages >> no intervention other than statins. 2D ECHO with normal EF. In 08/2015 he was hospitalized for ?sepsis with strep pneumoniae after acquiring left lower lobe pneumonia.  Review of Systems Constitutional: no weight gain/no weight loss, no fatigue, no subjective hyperthermia, no subjective hypothermia, + nocturia Eyes: no blurry vision, no xerophthalmia ENT: no sore throat, no nodules palpated in neck,  no dysphagia, no odynophagia, no hoarseness Cardiovascular: no CP/no SOB/no palpitations/no leg swelling Respiratory: no cough/no SOB/no wheezing Gastrointestinal: no N/no V/no D/no C/no acid reflux Musculoskeletal: no muscle aches/no joint aches Skin: no rashes, no hair loss Neurological: no tremors/+ numbness/+ tingling/no dizziness  I reviewed pt's medications, allergies, PMH, social hx, family hx, and changes were documented in the history of present illness. Otherwise, unchanged from my initial visit note.  Past Medical History:  Diagnosis Date  . Anemia   .  Atherosclerotic PVD with intermittent claudication   . CAD (coronary artery disease)   . Chronic diastolic congestive heart failure 11/23/2014  . DDD (degenerative disc disease), cervical 08/02/2017  . Diverticulosis   . DVT (deep venous thrombosis)   . Edema 08/19/2014  . GERD (gastroesophageal reflux disease)    hx hiatal hernia  . History of pneumonia 2002  . History of skin cancer 07/30/2019   Follows with skin surgery center  . Hyperlipidemia associated with type 2 diabetes mellitus 05/03/2017  . Hypertension associated with type 2 diabetes mellitus 08/20/2007  . IBS (irritable bowel syndrome)   . Mild neurocognitive disorder 07/07/2020  . Multiple lacunar infarcts    Two in the right basal ganglia occurring between 2016 and 2021  . OAB (overactive bladder) 03/26/2015   History of stress incontinence  . Osteoarthritis   . Peripheral neuropathy associated with type 2 diabetes mellitus 05/30/2010  . Shingles   . Type 2 diabetes mellitus with long-term current use of insulin 05/03/2017  . Varicose veins of lower extremities with other complications 12/19/3233   Past Surgical History:  Procedure Laterality Date  . BACK SURGERY    . CATARACT EXTRACTION Bilateral 08/10/2015,08/15/2015  . COLONOSCOPY     Hx: of  . CTS bilateral    . KNEE ARTHROSCOPY  1988, 1992   bilateral  . LEFT AND RIGHT HEART CATHETERIZATION WITH CORONARY ANGIOGRAM N/A 09/03/2014   Procedure: LEFT AND RIGHT HEART CATHETERIZATION WITH CORONARY ANGIOGRAM;  Surgeon: [name], MD;  Location: [facility] CATH LAB;  Service: Cardiovascular;  Laterality: N/A;  . LUMBAR LAMINECTOMY/DECOMPRESSION MICRODISCECTOMY N/A 08/18/2013   Procedure: Lumbar two-three, lumbar three-four, lumbar four-five decompressive laminectomies;  Surgeon: [name], MD;  Location: [place] NEURO ORS;  Service: Neurosurgery;  Laterality: N/A;  . TONSILLECTOMY    . TOTAL KNEE ARTHROPLASTY Bilateral 2005, 2007   Social History   Socioeconomic  History  . Marital status: Married    Spouse name: Not on file  . Number of children: 2  . Years of education: 57  . Highest education level: High school graduate  Occupational History  . Occupation: Retired  Tobacco Use  . Smoking status: Never Smoker  . Smokeless tobacco: Never Used  Vaping Use  . Vaping Use: Never used  Substance and Sexual Activity  . Alcohol use: No    Alcohol/week: 0.0 standard drinks  . Drug use: No  . Sexual activity: Not on file  Other Topics Concern  . 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 Difficulty of Paying Living Expenses: Not on file  Food Insecurity:   . Worried About Charity fundraiser in the Last Year: Not on file  . Ran Out of Food in the Last Year: Not on file  Transportation Needs:   . Lack of Transportation (Medical): Not on file  . Lack of Transportation (Non-Medical): Not on file  Physical Activity:   . Days of Exercise per Week: Not on file  . Minutes of Exercise per Session: Not on file  Stress:   . Feeling of Stress : Not on file  Social Connections:   . Frequency of Communication with Friends and Family: Not on file  . Frequency of Social Gatherings with Friends and Family: Not on file  . Attends Religious Services: Not on file  . Active Member of Clubs or Organizations: Not on file  . Attends Archivist Meetings: Not on file  . Marital Status: Not on file  Intimate Partner Violence:   . Fear of Current or Ex-Partner: Not on file  . Emotionally Abused: Not on file  . Physically Abused: Not on file  . Sexually Abused: Not on file    Current Outpatient Medications on File Prior to Visit  Medication Sig Dispense Refill  . Accu-Chek Softclix Lancets lancets Use to check blood sugar 5 times a day. 500 each 12  . Acetaminophen (TYLENOL ARTHRITIS PAIN PO) Take 625 mg by mouth 3 (three) times daily.     Marland Kitchen amLODipine (NORVASC) 5 MG tablet Take 0.5 tablets (2.5 mg total) by mouth daily. 90 tablet 0  . aspirin 81 MG tablet Take 81 mg by mouth daily.     . B-D UF III MINI PEN NEEDLES 31G X 5 MM MISC USE TO TEST BLOOD SUGAR FOUR TIMES A DAY 200 each 11  . Blood Glucose Monitoring Suppl (ACCU-CHEK GUIDE) w/Device KIT 1 kit by Does not apply route See admin instructions. Check blood sugar 3 times a day 1 kit 0  . donepezil (ARICEPT) 5 MG tablet Take 1/2 tablet daily for 2 weeks, then increase to 1 tablet daily 30 tablet 11  . ferrous sulfate 325 (65 FE) MG tablet Take 325 mg by mouth every other day.     . furosemide (LASIX) 40 MG tablet Take 1 tablet (40 mg total) by mouth daily. 90 tablet 1  . glucose blood (ACCU-CHEK GUIDE) test strip Use to check blood sugar 3 times a day 270 each 12  . HUMALOG KWIKPEN 100 UNIT/ML KwikPen INJECT 6 TO 10 UNITS INTO THE SKIN BEFORE EACH MEAL THREE TIMES A DAY 27 mL 2  . Insulin Detemir (LEVEMIR FLEXTOUCH) 100 UNIT/ML Pen INJECT 20 UNITS INTO THE SKIN DAILY AT 10 PM 15 mL 2  . Magnesium 200 MG TABS Take 1 tablet (200 mg total) by mouth daily. 30 each 11  . metFORMIN (GLUCOPHAGE) 1000 MG tablet TAKE ONE TABLET BY MOUTH TWICE A DAY WITH MEALS 180 tablet 0  . Multiple Vitamins-Minerals (CENTRUM SILVER PO) Take 1 tablet by mouth daily.     Marland Kitchen omeprazole (PRILOSEC) 20 MG capsule TAKE ONE CAPSULE BY MOUTH DAILY 90 capsule 0  . OVER THE COUNTER MEDICATION     . pravastatin (PRAVACHOL) 40 MG tablet TAKE ONE TABLET BY MOUTH EVERY EVENING 90 tablet 3  . Probiotic Product (PROBIOTIC PO) Take by mouth.    . Propylene Glycol (SYSTANE BALANCE) 0.6 % SOLN Apply to eye. 2 drops each eye once daily    .  vitamin B-12  (CYANOCOBALAMIN) 1000 MCG tablet Take 1,000 mcg by mouth daily.     No current facility-administered medications on file prior to visit.   Allergies  Allergen Reactions  . Ciprofloxacin Other (See Comments)    "made me have a weird feeling. Disoriented."   . Sulfamethoxazole Rash   Family History  Problem Relation Age of Onset  . Other Mother 53       CABG  . Heart disease Mother   . Hypertension Mother   . Dementia Father        Cognitive and behavioral changes following anesthesia  . Hyperlipidemia Son     Objective:   Physical Exam BP (!) 164/62   Pulse (!) 44   Ht 5' 10"  (1.778 m)   Wt 183 lb (83 kg)   SpO2 97%   BMI 26.26 kg/m  Body mass index is 26.26 kg/m.  Wt Readings from Last 3 Encounters:  08/25/20 183 lb (83 kg)  06/24/20 184 lb (83.5 kg)  06/21/20 185 lb 1.6 oz (84 kg)   Constitutional: overweight, in NAD, walks with a walker Eyes: PERRLA, EOMI, no exophthalmos ENT: moist mucous membranes, no thyromegaly, no cervical lymphadenopathy Cardiovascular: RRR, No RG, +1/6 SEM Respiratory: CTA B Gastrointestinal: abdomen soft, NT, ND, BS+ Musculoskeletal: no deformities, strength intact in all 4 Skin: moist, warm, no rashes Neurological: no tremor with outstretched hands, DTR normal in all 4   Assessment:     1. DM2, insulin-dependent, uncontrolled, with complications; target M5H for him: 7-7.5% - CAD - peripheral neuropathy - PAD Dr. [name]  2. PN - 2/2 DM  3. HL  Plan:     1. Pt with longstanding, previously uncontrolled type 2 diabetes, now with much better control, on a basal/bolus insulin regimen and Metformin.  At last visit, sugars were still at goal, occasionally higher before lunch, between 150 and 160s.  I advised him to write down what he ate for breakfast when sugars were this high before the next meal.  We discussed that he may need to take a higher dose of insulin for breakfast.  The majority of his fasting, morning sugars were  excellent.  However, he had some sugars in the 70s and I advised him that if these persist, he may need to decrease the dose of Levemir slightly.  At last visit, HbA1c was excellent, at 6.6%. -At this visit, sugars are almost all at goal, with only occasional mild hyperglycemic spikes.  He does not have sugars in the 70s anymore.  Overall, his control is very good, so we do not need to change his regimen.  Since he feels that he sometimes needs to get the snack at bedtime to avoid blood sugars dropping overnight, I advised him that he can decrease the dose of Levemir slightly after a particularly active day. - I advised him to: Patient Instructions  Please continue: - Metformin 1000 mg 2x a day - Levemir (14)-16 units at night - Humalog 15 min before a meal:  4-5 units before a small meal 6 units before a regular meal 8 units before a large meal Please subtract 2 units if you plan to be active after a meal.  Please come back for a follow-up appointment in 6 months.  - we checked his HbA1c: 6.6% (stable) - advised to check sugars at different times of the day - 3-4x a day, rotating check times - advised for yearly eye exams >> he is UTD - return to clinic in  6 months   2. PN -Due to diabetes -Stable, no new symptoms -Sees Dr. Barkley Bruns with podiatry -Continues on B complex daily and neuropathy cream as needed  3. HL -Reviewed latest lipid panel from a year ago: All fractions at goal Lab Results  Component Value Date   CHOL 121 08/06/2019   HDL 49.90 08/06/2019   LDLCALC 50 08/06/2019   TRIG 104.0 08/06/2019   CHOLHDL 2 08/06/2019  -Continues pravastatin without side effects -He is due for another lipid panel >> repeat coming up with PCP  Philemon Kingdom, MD PhD [facility] Endocrinology

## 2020-08-27 DIAGNOSIS — E1042 Type 1 diabetes mellitus with diabetic polyneuropathy: Secondary | ICD-10-CM | POA: Diagnosis not present

## 2020-08-28 ENCOUNTER — Other Ambulatory Visit: Payer: Self-pay | Admitting: Family Medicine

## 2020-08-30 ENCOUNTER — Other Ambulatory Visit: Payer: Self-pay

## 2020-08-30 ENCOUNTER — Encounter: Payer: Self-pay | Admitting: Physician Assistant

## 2020-08-30 ENCOUNTER — Ambulatory Visit: Payer: Medicare PPO | Admitting: Physician Assistant

## 2020-08-30 ENCOUNTER — Telehealth: Payer: Self-pay | Admitting: Cardiology

## 2020-08-30 VITALS — BP 120/68 | HR 74 | Ht 70.0 in | Wt 182.4 lb

## 2020-08-30 DIAGNOSIS — E119 Type 2 diabetes mellitus without complications: Secondary | ICD-10-CM | POA: Diagnosis not present

## 2020-08-30 DIAGNOSIS — I5032 Chronic diastolic (congestive) heart failure: Secondary | ICD-10-CM | POA: Diagnosis not present

## 2020-08-30 DIAGNOSIS — E1169 Type 2 diabetes mellitus with other specified complication: Secondary | ICD-10-CM | POA: Diagnosis not present

## 2020-08-30 DIAGNOSIS — R9431 Abnormal electrocardiogram [ECG] [EKG]: Secondary | ICD-10-CM | POA: Diagnosis not present

## 2020-08-30 DIAGNOSIS — I10 Essential (primary) hypertension: Secondary | ICD-10-CM | POA: Diagnosis not present

## 2020-08-30 DIAGNOSIS — E785 Hyperlipidemia, unspecified: Secondary | ICD-10-CM

## 2020-08-30 DIAGNOSIS — I2510 Atherosclerotic heart disease of native coronary artery without angina pectoris: Secondary | ICD-10-CM | POA: Diagnosis not present

## 2020-08-30 DIAGNOSIS — I493 Ventricular premature depolarization: Secondary | ICD-10-CM

## 2020-08-30 NOTE — Progress Notes (Signed)
Cardiology Office Note:    Date:  09/01/2020   ID:  [name], DOB 1932-12-26, MRN [id number]  PCP:  Caren Macadam, MD  [facility] Cardiologist:  Kirk Ruths, MD  [place] Electrophysiologist:  None   Referring MD: Caren Macadam, MD   Chief Complaint  Patient presents with  . Follow-up    seen for Dr. [name]    History of Present Illness:    [name] is a 86 y.o. male with a hx of CAD, chronic diastolic heart failure, history of DVT, hypertension, hyperlipidemia, and DM2.  Echocardiogram obtained on 08/26/2014 showed EF 60 to 65%, grade 1 DD, mild MR.  Cardiac catheterization performed in September 2015 showed a 30% left main, 40 to 50% LAD, no significant obstructive disease in the left circumflex or RCA.  Heart monitor in January 2019 showed sinus rhythm with PVCs and 3 beats nonsustained VT.  Myoview in February 2019 showed EF 55% with no ischemia or infarction.  His diabetes is being managed by endocrinology service.  He had a recent endocrinology office visit on 08/25/2020, blood pressure at the time was 164/62, however pulse was 44 which is unusual for him.  Unfortunately, no EKG was obtained.   We received phone call this morning regarding a fall episode last night.  EMS arrived and informed the patient he was in atrial fibrillation.  He came to cardiology office today along with his wife.  They have brought with them EKG strips from the EMS.  He says he did not have any dizzy spell, blurred vision, or feeling of passing out recently.  Instead he turned and twisted his foot and fell subsequently on the left side of bruising his left elbow.  He did not hit his head.  EKG performed by EMS was carefully reviewed by myself, this showed normal sinus rhythm with occasional PAC and frequent PVCs.  There was no sign of atrial fibrillation.  He denies any recent chest pain or shortness of breath with exertion.  He is currently not on any beta-blocker.  I  recommended a 24-hour Holter monitor to assess the PVC burden.  As long as the PVC burden does not exceed 10%, I think we can continue to monitor without needing additional medication.  His blood pressure and heart rate is all very well controlled.  He can follow-up with Dr. [name] in 1 month.   Past Medical History:  Diagnosis Date  . Anemia   . Atherosclerotic PVD with intermittent claudication   . CAD (coronary artery disease)   . Chronic diastolic congestive heart failure 11/23/2014  . DDD (degenerative disc disease), cervical 08/02/2017  . Diverticulosis   . DVT (deep venous thrombosis)   . Edema 08/19/2014  . GERD (gastroesophageal reflux disease)    hx hiatal hernia  . History of pneumonia 2002  . History of skin cancer 07/30/2019   Follows with skin surgery center  . Hyperlipidemia associated with type 2 diabetes mellitus 05/03/2017  . Hypertension associated with type 2 diabetes mellitus 08/20/2007  . IBS (irritable bowel syndrome)   . Mild neurocognitive disorder 07/07/2020  . Multiple lacunar infarcts    Two in the right basal ganglia occurring between 2016 and 2021  . OAB (overactive bladder) 03/26/2015   History of stress incontinence  . Osteoarthritis   . Peripheral neuropathy associated with type 2 diabetes mellitus 05/30/2010  . Shingles   . Type 2 diabetes mellitus with long-term current use of insulin 05/03/2017  .  Varicose veins of lower extremities with other complications 50/93/2671    Past Surgical History:  Procedure Laterality Date  . BACK SURGERY    . CATARACT EXTRACTION Bilateral 08/10/2015,08/15/2015  . COLONOSCOPY     Hx: of  . CTS bilateral    . KNEE ARTHROSCOPY  1988, 1992   bilateral  . LEFT AND RIGHT HEART CATHETERIZATION WITH CORONARY ANGIOGRAM N/A 09/03/2014   Procedure: LEFT AND RIGHT HEART CATHETERIZATION WITH CORONARY ANGIOGRAM;  Surgeon: [name], MD;  Location: [facility] CATH LAB;  Service: Cardiovascular;  Laterality: N/A;  . LUMBAR  LAMINECTOMY/DECOMPRESSION MICRODISCECTOMY N/A 08/18/2013   Procedure: Lumbar two-three, lumbar three-four, lumbar four-five decompressive laminectomies;  Surgeon: [name], MD;  Location: [facility] NEURO ORS;  Service: Neurosurgery;  Laterality: N/A;  . TONSILLECTOMY    . TOTAL KNEE ARTHROPLASTY Bilateral 2005, 2007    Current Medications: Current Meds  Medication Sig  . Accu-Chek Softclix Lancets lancets Use to check blood sugar 5 times a day.  . Acetaminophen (TYLENOL ARTHRITIS PAIN PO) Take 625 mg by mouth 3 (three) times daily.   Marland Kitchen amLODipine (NORVASC) 5 MG tablet Take 0.5 tablets (2.5 mg total) by mouth daily.  Marland Kitchen aspirin 81 MG tablet Take 81 mg by mouth daily.   . Blood Glucose Monitoring Suppl (ACCU-CHEK GUIDE) w/Device KIT 1 kit by Does not apply route See admin instructions. Check blood sugar 3 times a day  . donepezil (ARICEPT) 5 MG tablet Take 1/2 tablet daily for 2 weeks, then increase to 1 tablet daily  . ferrous sulfate 325 (65 FE) MG tablet Take 325 mg by mouth every other day.   . furosemide (LASIX) 40 MG tablet Take 1 tablet (40 mg total) by mouth daily.  Marland Kitchen glucose blood (ACCU-CHEK GUIDE) test strip Use to check blood sugar 3 times a day  . HUMALOG KWIKPEN 100 UNIT/ML KwikPen INJECT 6 TO 10 UNITS INTO THE SKIN BEFORE EACH MEAL THREE TIMES A DAY  . Insulin Detemir (LEVEMIR FLEXTOUCH) 100 UNIT/ML Pen INJECT 20 UNITS INTO THE SKIN DAILY AT 10 PM  . Magnesium 200 MG TABS Take 1 tablet (200 mg total) by mouth daily.  . metFORMIN (GLUCOPHAGE) 1000 MG tablet TAKE ONE TABLET BY MOUTH TWICE A DAY WITH MEALS  . Multiple Vitamins-Minerals (CENTRUM SILVER PO) Take 1 tablet by mouth daily.   Marland Kitchen omeprazole (PRILOSEC) 20 MG capsule TAKE ONE CAPSULE BY MOUTH DAILY  . OVER THE COUNTER MEDICATION   . pravastatin (PRAVACHOL) 40 MG tablet TAKE ONE TABLET BY MOUTH EVERY EVENING  . Probiotic Product (PROBIOTIC PO) Take by mouth.  . Propylene Glycol (SYSTANE BALANCE) 0.6 % SOLN Apply to eye. 2  drops each eye once daily  . vitamin B-12 (CYANOCOBALAMIN) 1000 MCG tablet Take 1,000 mcg by mouth daily.     Allergies:   Ciprofloxacin and Sulfamethoxazole   Social History   Socioeconomic History  . Marital status: Married    Spouse name: Not on file  . Number of children: 2  . Years of education: 65  . Highest education level: High school graduate  Occupational History  . Occupation: Retired  Tobacco Use  . Smoking status: Never Smoker  . Smokeless tobacco: Never Used  Vaping Use  . Vaping Use: Never used  Substance and Sexual Activity  . Alcohol use: No    Alcohol/week: 0.0 standard drinks  . Drug use: No  . Sexual activity: Not on file  Other Topics Concern  . 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 Difficulty of Paying Living Expenses: Not on file  Food Insecurity:   . Worried About Charity fundraiser in the Last Year: Not on file  . Ran Out of Food in the Last Year: Not on file  Transportation Needs:   . Lack of Transportation (Medical): Not on file  . Lack of Transportation (Non-Medical): Not on file  Physical Activity:   . Days of Exercise per Week: Not on file  . Minutes of Exercise per Session: Not on file  Stress:   . Feeling of Stress : Not on file  Social Connections:   . Frequency of Communication with Friends and Family: Not on file  . Frequency of Social Gatherings with Friends and Family: Not on file  . Attends Religious Services: Not on file  . Active Member of Clubs or Organizations: Not on file  . Attends Archivist Meetings: Not on file  . Marital Status:  Not on file     Family History: The patient's family history includes Dementia in his father; Heart disease in his mother; Hyperlipidemia in his son; Hypertension in his mother; Other (age of onset: 22) in his mother.  ROS:   Please see the history of present illness.     All other systems reviewed and are negative.  EKGs/Labs/Other Studies Reviewed:    The following studies were reviewed today:  Echo 08/26/2014 LV EF: 60% -  65%   -------------------------------------------------------------------  Indications:   785.2 Cardiac murmur.   -------------------------------------------------------------------  History:  PMH: Edema. Acquired from the patient and from the  patient&'s chart. Dyspnea. Risk factors: Hypertension. Diabetes  mellitus. Dyslipidemia.   -------------------------------------------------------------------  Study Conclusions   - Left ventricle: The cavity size was normal. Wall thickness was  normal. Systolic function was normal. The estimated ejection  fraction was in the range of 60% to 65%. Wall motion was normal;  there were no regional wall motion abnormalities. Doppler  parameters are consistent with abnormal left ventricular  relaxation (grade 1 diastolic dysfunction). The E/e&' ratio is  between 8-15, suggesting indeterminate LV filling pressure.  - Aortic valve: Mildly calcified leaflets. There was no stenosis.  There was no regurgitation.  - Mitral valve: Mildly thickened leaflets . There was mild  regurgitation.  - Left atrium: Moderately dilated at 41 ml/m2.   Impressions:   - LVEF 60-65%, mild LVH, mildly calcified aortic valve without  stenosis, mild MR, moderate LAE, diastolic dysfunction,  indeterminate LV filling pressure.    EKG:  EKG is ordered today.  The ekg ordered today demonstrates normal sinus rhythm with PVCs.  Recent Labs: 02/16/2020: TSH 1.32 06/01/2020: ALT 22; Hemoglobin 12.4; Platelets  175 06/04/2020: BUN 20; Creatinine, Ser 0.89; Potassium 4.8; Sodium 139  Recent Lipid Panel    Component Value Date/Time   CHOL 121 08/06/2019 0718   TRIG 104.0 08/06/2019 0718   HDL 49.90 08/06/2019 0718   CHOLHDL 2 08/06/2019 0718   VLDL 20.8 08/06/2019 0718   LDLCALC 50 08/06/2019 2778  Physical Exam:    VS:  BP 120/68   Pulse 74   Ht _0  (1.778 m)   Wt 182 lb 6.4 oz (82.7 kg)   SpO2 95%   BMI 26.17 kg/m     Wt Readings from Last 3 Encounters:  08/30/20 182 lb 6.4 oz (82.7 kg)  08/25/20 183 lb (83 kg)  06/24/20 184 lb (83.5 kg)     GEN:  Well nourished, well developed in no acute distress HEENT: Normal NECK: No JVD; No carotid bruits LYMPHATICS: No lymphadenopathy CARDIAC: RRR, no murmurs, rubs, gallops RESPIRATORY:  Clear to auscultation without rales, wheezing or rhonchi  ABDOMEN: Soft, non-tender, non-distended MUSCULOSKELETAL:  No edema; No deformity  SKIN: Warm and dry NEUROLOGIC:  Alert and oriented x 3 PSYCHIATRIC:  Normal affect   ASSESSMENT:    1. Nonspecific abnormal electrocardiogram (ECG) (EKG)   2. Frequent PVCs   3. Coronary artery disease involving native coronary artery of native heart without angina pectoris   4. Chronic diastolic congestive heart failure   5. Essential hypertension   6. Hyperlipidemia associated with type 2 diabetes mellitus   7. Controlled type 2 diabetes mellitus without complication, without long-term current use of insulin (HCC)    PLAN:    In order of problems listed above:  1. Abnormal EKG: Patient presents to cardiology service today after he suffered a fall and EMS who arrived told him he has atrial fibrillation.  I have reviewed all EMS strips, none which showed atrial fibrillation.  He has normal sinus rhythm with PACs and frequent PVCs.  I reassured the patient that he did not have atrial fibrillation  2. Frequent PVCs: I recommended a 24-hour Holter monitor to assess PVC burden  3. CAD: Continue aspirin.   He is not on a beta-blocker  4. Hypertension: Blood pressure stable  5. Hyperlipidemia: On pravastatin  6. DM2: managed by primary care provider.   Medication Adjustments/Labs and Tests Ordered: Current medicines are reviewed at length with the patient today.  Concerns regarding medicines are outlined above.  Orders Placed This Encounter  Procedures  . HOLTER MONITOR - 24 HOUR  . EKG 12-Lead   No orders of the defined types were placed in this encounter.   Patient Instructions  Medication Instructions:  Your physician recommends that you continue on your current medications as directed. Please refer to the Current Medication list given to you today.  *If you need a refill on your cardiac medications before your next appointment, please call your pharmacy*  Lab Work: NONE ordered at this time of appointment   If you have labs (blood work) drawn today and your tests are completely normal, you will receive your results only by: Marland Kitchen MyChart Message (if you have MyChart) OR . A paper copy in the mail If you have any lab test that is abnormal or we need to change your treatment, we will call you to review the results.  Testing/Procedures: Bryn Gulling- Long Term Monitor Instructions   Your physician has requested you wear your ZIO patch monitor 1 days.   This is a single patch monitor.  [employer] supplies one patch monitor per enrollment.  Additional stickers are not available.   Please do not apply patch if you will be having a Nuclear Stress Test, Echocardiogram, Cardiac CT, MRI, or Chest Xray during the time frame you would be wearing the monitor. The patch cannot be worn during these tests.  You cannot remove and re-apply the ZIO XT patch monitor.  Your ZIO patch monitor will be sent USPS Priority mail from [facility] directly to your home address. The monitor may also be mailed to a PO BOX if home delivery is not available.   It may take 3-5 days to receive your monitor  after you have been enrolled.   Once you have received you monitor, please review enclosed instructions.  Your monitor has already been registered assigning a specific monitor serial # to you.   Applying the monitor   Shave hair from upper left chest.   Hold abrader disc by orange tab.  Rub abrader in 40 strokes over left upper chest as indicated in your monitor instructions.   Clean area with 4 enclosed alcohol pads .  Use all pads to assure are is cleaned thoroughly.  Let dry.   Apply patch as indicated in monitor instructions.  Patch will be place under collarbone on left side of chest with arrow pointing upward.   Rub patch adhesive wings for 2 minutes.Remove white label marked "1".  Remove white label marked "2".  Rub patch adhesive wings for 2 additional minutes.   While looking in a mirror, press and release button in center of patch.  A small green light will flash 3-4 times .  This will be your only indicator the monitor has been turned on.     Do not shower for the first 24 hours.  You may shower after the first 24 hours.   Press button if you feel a symptom. You will hear a small click.  Record Date, Time and Symptom in the Patient Log Book.   When you are ready to remove patch, follow instructions on last 2 pages of Patient Log Book.  Stick patch monitor onto last page of Patient Log Book.   Place Patient Log Book in [place] box.  Use locking tab on box and tape box closed securely.  The [employer] and [employer] has [employer] on it.  Please place in mailbox as soon as possible.  Your physician should have your test results approximately 7 days after the monitor has been mailed back to [facility].   Call [place] at [phone] if you have questions regarding your ZIO XT patch monitor.  Call them immediately if you see an orange light blinking on your monitor.   If your monitor falls off in less than 4 days contact our Monitor department at  [phone].  If your monitor becomes loose or falls off after 4 days call [employer] at [phone] for suggestions on securing your monitor.    Follow-Up: At [facility], you and your health needs are our priority.  As part of our continuing mission to provide you with exceptional heart care, we have created designated Provider Care Teams.  These Care Teams include your primary Cardiologist (physician) and Advanced Practice Providers (APPs -  Physician Assistants and Nurse Practitioners) who all work together to provide you with the care you need, when you need it.    Your next appointment:   Follow up as scheduled    The format for your next appointment:   In Person  Provider:   Kirk Ruths, MD  Other Instructions      Signed, Almyra Deforest, [place]  09/01/2020 11:41 PM    [place]

## 2020-08-30 NOTE — Patient Instructions (Addendum)
Medication Instructions:  Your physician recommends that you continue on your current medications as directed. Please refer to the Current Medication list given to you today.  *If you need a refill on your cardiac medications before your next appointment, please call your pharmacy*  Lab Work: NONE ordered at this time of appointment   If you have labs (blood work) drawn today and your tests are completely normal, you will receive your results only by: Marland Kitchen MyChart Message (if you have MyChart) OR . A paper copy in the mail If you have any lab test that is abnormal or we need to change your treatment, we will call you to review the results.  Testing/Procedures: Bryn Gulling- Long Term Monitor Instructions   Your physician has requested you wear your ZIO patch monitor 1 days.   This is a single patch monitor.  [employer] supplies one patch monitor per enrollment.  Additional stickers are not available.   Please do not apply patch if you will be having a Nuclear Stress Test, Echocardiogram, Cardiac CT, MRI, or Chest Xray during the time frame you would be wearing the monitor. The patch cannot be worn during these tests.  You cannot remove and re-apply the ZIO XT patch monitor.   Your ZIO patch monitor will be sent USPS Priority mail from [facility] directly to your home address. The monitor may also be mailed to a PO BOX if home delivery is not available.   It may take 3-5 days to receive your monitor after you have been enrolled.   Once you have received you monitor, please review enclosed instructions.  Your monitor has already been registered assigning a specific monitor serial # to you.   Applying the monitor   Shave hair from upper left chest.   Hold abrader disc by orange tab.  Rub abrader in 40 strokes over left upper chest as indicated in your monitor instructions.   Clean area with 4 enclosed alcohol pads .  Use all pads to assure are is cleaned thoroughly.  Let dry.   Apply  patch as indicated in monitor instructions.  Patch will be place under collarbone on left side of chest with arrow pointing upward.   Rub patch adhesive wings for 2 minutes.Remove white label marked "1".  Remove white label marked "2".  Rub patch adhesive wings for 2 additional minutes.   While looking in a mirror, press and release button in center of patch.  A small green light will flash 3-4 times .  This will be your only indicator the monitor has been turned on.     Do not shower for the first 24 hours.  You may shower after the first 24 hours.   Press button if you feel a symptom. You will hear a small click.  Record Date, Time and Symptom in the Patient Log Book.   When you are ready to remove patch, follow instructions on last 2 pages of Patient Log Book.  Stick patch monitor onto last page of Patient Log Book.   Place Patient Log Book in [place] box.  Use locking tab on box and tape box closed securely.  The [employer] has [employer] on it.  Please place in mailbox as soon as possible.  Your physician should have your test results approximately 7 days after the monitor has been mailed back to [facility].   Call [place] at [phone] if you have questions regarding your ZIO XT patch monitor.  Call them immediately if you see an orange light blinking on your monitor.   If your monitor falls off in less than 4 days contact our Monitor department at [phone].  If your monitor becomes loose or falls off after 4 days call [employer] at [phone] for suggestions on securing your monitor.    Follow-Up: At [facility], you and your health needs are our priority.  As part of our continuing mission to provide you with exceptional heart care, we have created designated Provider Care Teams.  These Care Teams include your primary Cardiologist (physician) and Advanced Practice Providers (APPs -  Physician Assistants and Nurse Practitioners) who all work  together to provide you with the care you need, when you need it.    Your next appointment:   Follow up as scheduled    The format for your next appointment:   In Person  Provider:   Kirk Ruths, MD  Other Instructions

## 2020-08-30 NOTE — Telephone Encounter (Signed)
New message:    Patient wife calling stating that the patient fell last night and had to call [facility] to help get him up, they stated that the patient needed to call the office this morning.

## 2020-08-30 NOTE — Telephone Encounter (Signed)
Returned call to patient's wife. Patient fell last night, EMS had to come help him up. EMS said he was in AFib and his HR was "bouncing all over the place". Wife states patient did not pass out. AFib is not current diagnosis on patient's chart. Patient has visit in October with Dr. [name] but scheduled him appointment today with Janan Ridge PA @ 1:45pm for evaluation. Asked wife to bring EKG from EMS for review  EMS vitals:  BP 140/90 HR 90 RR 18 O2 98% CBG 164

## 2020-08-31 ENCOUNTER — Encounter: Payer: Self-pay | Admitting: *Deleted

## 2020-08-31 NOTE — Progress Notes (Signed)
Patient ID: [name], male   DOB: 1933/07/20, 86 y.o.   MRN: [id number] Patient enrolled for [employer] to ship a 3 day ZIO XT long term holter monitor to his home.

## 2020-09-01 ENCOUNTER — Encounter: Payer: Self-pay | Admitting: Physician Assistant

## 2020-09-03 ENCOUNTER — Other Ambulatory Visit: Payer: Self-pay | Admitting: Family Medicine

## 2020-09-03 ENCOUNTER — Other Ambulatory Visit: Payer: Self-pay | Admitting: Internal Medicine

## 2020-09-03 DIAGNOSIS — I152 Hypertension secondary to endocrine disorders: Secondary | ICD-10-CM

## 2020-09-07 ENCOUNTER — Ambulatory Visit (INDEPENDENT_AMBULATORY_CARE_PROVIDER_SITE_OTHER): Payer: Medicare PPO

## 2020-09-07 DIAGNOSIS — I493 Ventricular premature depolarization: Secondary | ICD-10-CM | POA: Diagnosis not present

## 2020-09-13 DIAGNOSIS — I493 Ventricular premature depolarization: Secondary | ICD-10-CM | POA: Diagnosis not present

## 2020-09-14 ENCOUNTER — Other Ambulatory Visit: Payer: Self-pay | Admitting: *Deleted

## 2020-09-14 ENCOUNTER — Other Ambulatory Visit: Payer: Self-pay | Admitting: Physician Assistant

## 2020-09-14 ENCOUNTER — Other Ambulatory Visit: Payer: Self-pay

## 2020-09-14 ENCOUNTER — Other Ambulatory Visit (INDEPENDENT_AMBULATORY_CARE_PROVIDER_SITE_OTHER): Payer: Medicare PPO

## 2020-09-14 DIAGNOSIS — I152 Hypertension secondary to endocrine disorders: Secondary | ICD-10-CM | POA: Diagnosis not present

## 2020-09-14 DIAGNOSIS — I493 Ventricular premature depolarization: Secondary | ICD-10-CM

## 2020-09-14 DIAGNOSIS — E1142 Type 2 diabetes mellitus with diabetic polyneuropathy: Secondary | ICD-10-CM

## 2020-09-14 DIAGNOSIS — E1159 Type 2 diabetes mellitus with other circulatory complications: Secondary | ICD-10-CM | POA: Diagnosis not present

## 2020-09-14 DIAGNOSIS — E1169 Type 2 diabetes mellitus with other specified complication: Secondary | ICD-10-CM | POA: Diagnosis not present

## 2020-09-14 DIAGNOSIS — Z794 Long term (current) use of insulin: Secondary | ICD-10-CM | POA: Diagnosis not present

## 2020-09-14 DIAGNOSIS — E785 Hyperlipidemia, unspecified: Secondary | ICD-10-CM | POA: Diagnosis not present

## 2020-09-14 DIAGNOSIS — E611 Iron deficiency: Secondary | ICD-10-CM

## 2020-09-15 LAB — COMPREHENSIVE METABOLIC PANEL
AG Ratio: 1.9 (calc) (ref 1.0–2.5)
ALT: 12 U/L (ref 9–46)
AST: 18 U/L (ref 10–35)
Albumin: 4.5 g/dL (ref 3.6–5.1)
Alkaline phosphatase (APISO): 39 U/L (ref 35–144)
BUN: 20 mg/dL (ref 7–25)
CO2: 28 mmol/L (ref 20–32)
Calcium: 10 mg/dL (ref 8.6–10.3)
Chloride: 104 mmol/L (ref 98–110)
Creat: 0.9 mg/dL (ref 0.70–1.11)
Globulin: 2.4 g/dL (calc) (ref 1.9–3.7)
Glucose, Bld: 148 mg/dL — ABNORMAL HIGH (ref 65–99)
Potassium: 5 mmol/L (ref 3.5–5.3)
Sodium: 140 mmol/L (ref 135–146)
Total Bilirubin: 0.7 mg/dL (ref 0.2–1.2)
Total Protein: 6.9 g/dL (ref 6.1–8.1)

## 2020-09-15 LAB — CBC WITH DIFFERENTIAL/PLATELET
Absolute Monocytes: 387 cells/uL (ref 200–950)
Basophils Absolute: 17 cells/uL (ref 0–200)
Basophils Relative: 0.2 %
Eosinophils Absolute: 77 cells/uL (ref 15–500)
Eosinophils Relative: 0.9 %
HCT: 39.6 % (ref 38.5–50.0)
Hemoglobin: 13 g/dL — ABNORMAL LOW (ref 13.2–17.1)
Lymphs Abs: 5513 cells/uL — ABNORMAL HIGH (ref 850–3900)
MCH: 30.7 pg (ref 27.0–33.0)
MCHC: 32.8 g/dL (ref 32.0–36.0)
MCV: 93.4 fL (ref 80.0–100.0)
MPV: 11.4 fL (ref 7.5–12.5)
Monocytes Relative: 4.5 %
Neutro Abs: 2606 cells/uL (ref 1500–7800)
Neutrophils Relative %: 30.3 %
Platelets: 199 10*3/uL (ref 140–400)
RBC: 4.24 10*6/uL (ref 4.20–5.80)
RDW: 13.2 % (ref 11.0–15.0)
Total Lymphocyte: 64.1 %
WBC: 8.6 10*3/uL (ref 3.8–10.8)

## 2020-09-15 LAB — HEMOGLOBIN A1C
Hgb A1c MFr Bld: 6.8 % of total Hgb — ABNORMAL HIGH (ref <5.7)
Mean Plasma Glucose: 148 (calc)
eAG (mmol/L): 8.2 (calc)

## 2020-09-15 LAB — IRON,TIBC AND FERRITIN PANEL
%SAT: 19 % (calc) — ABNORMAL LOW (ref 20–48)
Ferritin: 15 ng/mL — ABNORMAL LOW (ref 24–380)
Iron: 82 ug/dL (ref 50–180)
TIBC: 423 mcg/dL (calc) (ref 250–425)

## 2020-09-15 LAB — LIPID PANEL
Cholesterol: 120 mg/dL (ref <200)
HDL: 56 mg/dL (ref >=40)
LDL Cholesterol (Calc): 49 mg/dL (calc)
Non-HDL Cholesterol (Calc): 64 mg/dL (calc) (ref <130)
Total CHOL/HDL Ratio: 2.1 (calc) (ref <5.0)
Triglycerides: 72 mg/dL (ref <150)

## 2020-09-21 ENCOUNTER — Other Ambulatory Visit: Payer: Self-pay

## 2020-09-21 ENCOUNTER — Ambulatory Visit (INDEPENDENT_AMBULATORY_CARE_PROVIDER_SITE_OTHER): Payer: Medicare PPO

## 2020-09-21 VITALS — Wt 178.0 lb

## 2020-09-21 DIAGNOSIS — Z0000 Encounter for general adult medical examination without abnormal findings: Secondary | ICD-10-CM | POA: Diagnosis not present

## 2020-09-21 NOTE — Patient Instructions (Addendum)
[name] , Thank you for taking time to come for your Medicare Wellness Visit. I appreciate your ongoing commitment to your health goals. Please review the following plan we discussed and let me know if I can assist you in the future.   Screening recommendations/referrals: Colonoscopy: No Longer Required  Recommended yearly ophthalmology/optometry visit for glaucoma screening and checkup Recommended yearly dental visit for hygiene and checkup  Vaccinations: Influenza vaccine: Pt request to get at 09/22/20 appointment Pneumococcal vaccine: Up to date Tdap vaccine: Up to date Shingles vaccine: Completed 06/18/17 & 09/10/17   Covid-19: Completed 1/16, 01/17/20 and Pt stated booster 08/01/20 Advanced directives: Please bring a copy of your health care power of attorney and living will to the office at your convenience.  Conditions/risks identified: Keep getting healther  Next appointment: Follow up in one year for your annual wellness visit.   Preventive Care 84 Years and Older, Male Preventive care refers to lifestyle choices and visits with your health care provider that can promote health and wellness. What does preventive care include?  A yearly physical exam. This is also called an annual well check.  Dental exams once or twice a year.  Routine eye exams. Ask your health care provider how often you should have your eyes checked.  Personal lifestyle choices, including:  Daily care of your teeth and gums.  Regular physical activity.  Eating a healthy diet.  Avoiding tobacco and drug use.  Limiting alcohol use.  Practicing safe sex.  Taking low doses of aspirin every day.  Taking vitamin and mineral supplements as recommended by your health care provider. What happens during an annual well check? The services and screenings done by your health care provider during your annual well check will depend on your age, overall health, lifestyle risk factors, and family history of  disease. Counseling  Your health care provider may ask you questions about your:  Alcohol use.  Tobacco use.  Drug use.  Emotional well-being.  Home and relationship well-being.  Sexual activity.  Eating habits.  History of falls.  Memory and ability to understand (cognition).  Work and work Statistician. Screening  You may have the following tests or measurements:  Height, weight, and BMI.  Blood pressure.  Lipid and cholesterol levels. These may be checked every 5 years, or more frequently if you are over 84 years old.  Skin check.  Lung cancer screening. You may have this screening every year starting at age 84 if you have a 30-pack-year history of smoking and currently smoke or have quit within the past 15 years.  Fecal occult blood test (FOBT) of the stool. You may have this test every year starting at age 84.  Flexible sigmoidoscopy or colonoscopy. You may have a sigmoidoscopy every 5 years or a colonoscopy every 10 years starting at age 84.  Prostate cancer screening. Recommendations will vary depending on your family history and other risks.  Hepatitis C blood test.  Hepatitis B blood test.  Sexually transmitted disease (STD) testing.  Diabetes screening. This is done by checking your blood sugar (glucose) after you have not eaten for a while (fasting). You may have this done every 1-3 years.  Abdominal aortic aneurysm (AAA) screening. You may need this if you are a current or former smoker.  Osteoporosis. You may be screened starting at age 84 if you are at high risk. Talk with your health care provider about your test results, treatment options, and if necessary, the need for more tests. Vaccines  Your health care provider may recommend certain vaccines, such as:  Influenza vaccine. This is recommended every year.  Tetanus, diphtheria, and acellular pertussis (Tdap, Td) vaccine. You may need a Td booster every 10 years.  Zoster vaccine. You may  need this after age 84.  Pneumococcal 13-valent conjugate (PCV13) vaccine. One dose is recommended after age 84.  Pneumococcal polysaccharide (PPSV23) vaccine. One dose is recommended after age 84. Talk to your health care provider about which screenings and vaccines you need and how often you need them. This information is not intended to replace advice given to you by your health care provider. Make sure you discuss any questions you have with your health care provider. Document Released: 12/24/2015 Document Revised: 08/16/2016 Document Reviewed: 09/28/2015 Elsevier Interactive Patient Education  2017 [place] Prevention in the Home Falls can cause injuries. They can happen to people of all ages. There are many things you can do to make your home safe and to help prevent falls. What can I do on the outside of my home?  Regularly fix the edges of walkways and driveways and fix any cracks.  Remove anything that might make you trip as you walk through a door, such as a raised step or threshold.  Trim any bushes or trees on the path to your home.  Use bright outdoor lighting.  Clear any walking paths of anything that might make someone trip, such as rocks or tools.  Regularly check to see if handrails are loose or broken. Make sure that both sides of any steps have handrails.  Any raised decks and porches should have guardrails on the edges.  Have any leaves, snow, or ice cleared regularly.  Use sand or salt on walking paths during winter.  Clean up any spills in your garage right away. This includes oil or grease spills. What can I do in the bathroom?  Use night lights.  Install grab bars by the toilet and in the tub and shower. Do not use towel bars as grab bars.  Use non-skid mats or decals in the tub or shower.  If you need to sit down in the shower, use a plastic, non-slip stool.  Keep the floor dry. Clean up any water that spills on the floor as soon as it  happens.  Remove soap buildup in the tub or shower regularly.  Attach bath mats securely with double-sided non-slip rug tape.  Do not have throw rugs and other things on the floor that can make you trip. What can I do in the bedroom?  Use night lights.  Make sure that you have a light by your bed that is easy to reach.  Do not use any sheets or blankets that are too big for your bed. They should not hang down onto the floor.  Have a firm chair that has side arms. You can use this for support while you get dressed.  Do not have throw rugs and other things on the floor that can make you trip. What can I do in the kitchen?  Clean up any spills right away.  Avoid walking on wet floors.  Keep items that you use a lot in easy-to-reach places.  If you need to reach something above you, use a strong step stool that has a grab bar.  Keep electrical cords out of the way.  Do not use floor polish or wax that makes floors slippery. If you must use wax, use non-skid floor wax.  Do  not have throw rugs and other things on the floor that can make you trip. What can I do with my stairs?  Do not leave any items on the stairs.  Make sure that there are handrails on both sides of the stairs and use them. Fix handrails that are broken or loose. Make sure that handrails are as long as the stairways.  Check any carpeting to make sure that it is firmly attached to the stairs. Fix any carpet that is loose or worn.  Avoid having throw rugs at the top or bottom of the stairs. If you do have throw rugs, attach them to the floor with carpet tape.  Make sure that you have a light switch at the top of the stairs and the bottom of the stairs. If you do not have them, ask someone to add them for you. What else can I do to help prevent falls?  Wear shoes that:  Do not have high heels.  Have rubber bottoms.  Are comfortable and fit you well.  Are closed at the toe. Do not wear sandals.  If you  use a stepladder:  Make sure that it is fully opened. Do not climb a closed stepladder.  Make sure that both sides of the stepladder are locked into place.  Ask someone to hold it for you, if possible.  Clearly mark and make sure that you can see:  Any grab bars or handrails.  First and last steps.  Where the edge of each step is.  Use tools that help you move around (mobility aids) if they are needed. These include:  Canes.  Walkers.  Scooters.  Crutches.  Turn on the lights when you go into a dark area. Replace any light bulbs as soon as they burn out.  Set up your furniture so you have a clear path. Avoid moving your furniture around.  If any of your floors are uneven, fix them.  If there are any pets around you, be aware of where they are.  Review your medicines with your doctor. Some medicines can make you feel dizzy. This can increase your chance of falling. Ask your doctor what other things that you can do to help prevent falls. This information is not intended to replace advice given to you by your health care provider. Make sure you discuss any questions you have with your health care provider. Document Released: 09/23/2009 Document Revised: 05/04/2016 Document Reviewed: 01/01/2015 Elsevier Interactive Patient Education  2017 [employer].

## 2020-09-21 NOTE — Progress Notes (Addendum)
Virtual Visit via Telephone Note  I connected with  [name] on 09/21/20 at  8:45 AM EDT by telephone and verified that I am speaking with the correct person using two identifiers.  Medicare Annual Wellness visit completed telephonically due to Covid-19 pandemic.   Persons participating in this call: This Health Coach and this patient and wife [name]  Location: Patient: Home  Provider: Office   I discussed the limitations, risks, security and privacy concerns of performing an evaluation and management service by telephone and the availability of in person appointments. The patient expressed understanding and agreed to proceed.  Unable to perform video visit due to video visit attempted and failed and/or patient does not have video capability.   Some vital signs may be absent or patient reported.   Willette Brace, LPN    Subjective:   [name] is a 86 y.o. male who presents for Medicare Annual/Subsequent preventive examination.  Review of Systems     Cardiac Risk Factors include: diabetes mellitus;dyslipidemia;hypertension;male gender;sedentary lifestyle     Objective:    Today's Vitals   09/21/20 0910  Weight: 178 lb (80.7 kg)   Body mass index is 25.54 kg/m.  Advanced Directives 09/21/2020 06/24/2020 06/01/2020 04/27/2017 08/01/2016 08/26/2015 03/26/2015  Does Patient Have a Medical Advance Directive? Yes Yes Yes Yes Yes Yes Yes  Type of Advance Directive Living will;Healthcare Power of [place];Living will [place];Living will - Living will;Healthcare Power of Attorney Living will;Healthcare Power of [place];Living will  Does patient want to make changes to medical advance directive? - - No - Patient declined - No - Patient declined No - Patient declined -  Copy of [place] in Chart? No - copy requested - No - copy requested - Yes No - copy requested -  Would  patient like information on creating a medical advance directive? - - - - - - -    Current Medications (verified) Outpatient Encounter Medications as of 09/21/2020  Medication Sig  . Accu-Chek Softclix Lancets lancets Use to check blood sugar 5 times a day.  Marland Kitchen amLODipine (NORVASC) 5 MG tablet TAKE ONE TABLET BY MOUTH DAILY  . aspirin 81 MG tablet Take 81 mg by mouth daily.   . B-D UF III MINI PEN NEEDLES 31G X 5 MM MISC USE TO TEST BLOOD SUGAR FOUR TIMES A DAY  . Blood Glucose Monitoring Suppl (ACCU-CHEK GUIDE) w/Device KIT 1 kit by Does not apply route See admin instructions. Check blood sugar 3 times a day  . donepezil (ARICEPT) 5 MG tablet Take 1/2 tablet daily for 2 weeks, then increase to 1 tablet daily  . ferrous sulfate 325 (65 FE) MG tablet Take 325 mg by mouth every other day.   . furosemide (LASIX) 40 MG tablet Take 1 tablet (40 mg total) by mouth daily.  Marland Kitchen glucose blood (ACCU-CHEK GUIDE) test strip Use to check blood sugar 3 times a day  . HUMALOG KWIKPEN 100 UNIT/ML KwikPen INJECT 6 TO 10 UNITS INTO THE SKIN BEFORE EACH MEAL THREE TIMES A DAY  . insulin detemir (LEVEMIR FLEXTOUCH) 100 UNIT/ML FlexPen INJECT 20 UNITS INTO THE SKIN DAILY AT 10PM  . Magnesium 200 MG TABS Take 1 tablet (200 mg total) by mouth daily.  . metFORMIN (GLUCOPHAGE) 1000 MG tablet TAKE ONE TABLET BY MOUTH TWICE A DAY WITH MEALS  . Multiple Vitamins-Minerals (CENTRUM SILVER PO) Take 1 tablet by mouth daily.   Marland Kitchen  omeprazole (PRILOSEC) 20 MG capsule TAKE ONE CAPSULE BY MOUTH DAILY  . OVER THE COUNTER MEDICATION   . pravastatin (PRAVACHOL) 40 MG tablet TAKE ONE TABLET BY MOUTH EVERY EVENING  . Probiotic Product (PROBIOTIC PO) Take by mouth.  . Propylene Glycol (SYSTANE BALANCE) 0.6 % SOLN Apply to eye. 2 drops each eye once daily  . Acetaminophen (TYLENOL ARTHRITIS PAIN PO) Take 625 mg by mouth 3 (three) times daily.   . vitamin B-12 (CYANOCOBALAMIN) 1000 MCG tablet Take 1,000 mcg by mouth daily.   No  facility-administered encounter medications on file as of 09/21/2020.    Allergies (verified) Ciprofloxacin and Sulfamethoxazole   History: Past Medical History:  Diagnosis Date  . Anemia   . Atherosclerotic PVD with intermittent claudication   . CAD (coronary artery disease)   . Chronic diastolic congestive heart failure 11/23/2014  . DDD (degenerative disc disease), cervical 08/02/2017  . Diverticulosis   . DVT (deep venous thrombosis)   . Edema 08/19/2014  . GERD (gastroesophageal reflux disease)    hx hiatal hernia  . History of pneumonia 2002  . History of skin cancer 07/30/2019   Follows with skin surgery center  . Hyperlipidemia associated with type 2 diabetes mellitus 05/03/2017  . Hypertension associated with type 2 diabetes mellitus 08/20/2007  . IBS (irritable bowel syndrome)   . Mild neurocognitive disorder 07/07/2020  . Multiple lacunar infarcts    Two in the right basal ganglia occurring between 2016 and 2021  . OAB (overactive bladder) 03/26/2015   History of stress incontinence  . Osteoarthritis   . Peripheral neuropathy associated with type 2 diabetes mellitus 05/30/2010  . Shingles   . Type 2 diabetes mellitus with long-term current use of insulin 05/03/2017  . Varicose veins of lower extremities with other complications 29/79/8921   Past Surgical History:  Procedure Laterality Date  . BACK SURGERY    . CATARACT EXTRACTION Bilateral 08/10/2015,08/15/2015  . COLONOSCOPY     Hx: of  . CTS bilateral    . KNEE ARTHROSCOPY  1988, 1992   bilateral  . LEFT AND RIGHT HEART CATHETERIZATION WITH CORONARY ANGIOGRAM N/A 09/03/2014   Procedure: LEFT AND RIGHT HEART CATHETERIZATION WITH CORONARY ANGIOGRAM;  Surgeon: [name], MD;  Location: [facility] CATH LAB;  Service: Cardiovascular;  Laterality: N/A;  . LUMBAR LAMINECTOMY/DECOMPRESSION MICRODISCECTOMY N/A 08/18/2013   Procedure: Lumbar two-three, lumbar three-four, lumbar four-five decompressive laminectomies;   Surgeon: [name], MD;  Location: [facility];  Service: Neurosurgery;  Laterality: N/A;  . TONSILLECTOMY    . TOTAL KNEE ARTHROPLASTY Bilateral 2005, 2007   Family History  Problem Relation Age of Onset  . Other Mother 75       CABG  . Heart disease Mother   . Hypertension Mother   . Dementia Father        Cognitive and behavioral changes following anesthesia  . Hyperlipidemia Son    Social History   Socioeconomic History  . Marital status: Married    Spouse name: Not on file  . Number of children: 2  . Years of education: 73  . Highest education level: High school graduate  Occupational History  . Occupation: Retired  Tobacco Use  . Smoking status: Never Smoker  . Smokeless tobacco: Never Used  Vaping Use  . Vaping Use: Never used  Substance and Sexual Activity  . Alcohol use: No    Alcohol/week: 0.0 standard drinks  . Drug use: No  . Sexual activity: Not on file  Other Topics Concern  . 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 Difficulty of Paying Living Expenses: Not hard at all  Food Insecurity: No Food Insecurity  . Worried About Charity fundraiser in the Last Year: Never true  . Ran Out of Food in the Last Year: Never true  Transportation Needs: No Transportation Needs  . Lack of Transportation (Medical): No  . Lack of Transportation (Non-Medical): No  Physical Activity: Inactive  . Days of Exercise per Week: 0 days  . Minutes of Exercise per Session: 0 min  Stress: No Stress Concern Present  . Feeling of Stress : Not at all  Social Connections: Moderately  Integrated  . Frequency of Communication with Friends and Family: More than three times a week  . Frequency of Social Gatherings with Friends and Family: Once a week  . Attends Religious Services: More than 4 times per year  . Active Member of Clubs or Organizations: No  . Attends Archivist Meetings: Never  . Marital Status: Married    Tobacco Counseling Counseling given: Not Answered   Clinical Intake:  Pre-visit preparation completed: Yes  Pain : No/denies pain     BMI - recorded: 25.54 Nutritional Status: BMI 25 -29 Overweight Diabetes: Yes CBG done?: Yes (89) CBG resulted in Enter/ Edit results?: No Did pt. bring in CBG monitor from home?: No  How often do you need to have someone help you when you read instructions, pamphlets, or other written materials from your doctor or pharmacy?: 1 - Never  Diabetic?Yes  Interpreter Needed?: No  Information entered by :: Charlott Rakes, LPN   Activities of Daily Living In your present state of health, do you have any difficulty performing the following activities: 09/21/2020  Hearing? N  Vision? N  Difficulty concentrating or making decisions? Y  Walking or climbing stairs? N  Dressing or bathing? Y  Comment wife assistance  Doing errands, shopping? Y  Preparing Food and eating ? Y  Comment wife prepares meals  Using the Toilet? N  In the past six months, have you accidently leaked urine? Y  Do you have problems with loss of bowel control? N  Managing your Medications? N  Managing your Finances? N  Housekeeping or managing your Housekeeping? N  Some recent data might be hidden    Patient Care Team: Caren Macadam, MD as PCP - General (Family Medicine) [name] Denice Bors, MD as PCP - Cardiology (Cardiology) Hillary Bow, MD as Attending Physician (Cardiology) Philemon Kingdom, MD as Consulting Physician (Endocrinology) Idolina Primer [facility]) Marlaine Hind, MD as Consulting Physician  (Physical Medicine and Rehabilitation) Newman Pies, MD as Consulting Physician (Neurosurgery) Cameron Sprang, MD as Consulting Physician (Neurology)  Indicate any recent Medical Services you may have received from other than [facility] providers in the past year (date may be approximate).     Assessment:   This is a routine wellness examination for [place].  Hearing/Vision screen  Hearing Screening  $'125Hz'y$'250Hz'$'500Hz'$'1000Hz'$'2000Hz'$'3000Hz'$'4000Hz'$'6000Hz'$'8000Hz'$   Right ear:           Left ear:           Comments: No difficulty at this time  Vision Screening Comments: Pt follows up with Dr Alois Cliche annually  Dietary issues and exercise activities discussed: Current Exercise Habits: The patient does not participate in regular exercise at present, Exercise limited by: cardiac condition(s)  Goals    . Exercise 150 minutes per week (moderate activity)     Will try to go to the silver sneaker class with your wife     . Patient Stated     The silver sneaker program is still available.    . Patient Stated     Working to getting healthier      Depression Screen PHQ 2/9 Scores 09/21/2020 10/16/2019 07/30/2019 05/21/2018 05/21/2018 04/27/2017 04/20/2016  PHQ - 2 Score 0 0 0 0 0 0 0    Fall Risk Fall Risk  09/21/2020 06/24/2020 10/16/2019 05/21/2018 04/27/2017  Falls in the past year? 1 0 1 Yes Yes  Comment - - as he was turning, foot was caught in carpet - -  Number falls in past yr: 1 0 0 1 2 or more  Injury with Fall? 1 0 0 Yes -  Comment injured left elbow - - - -  Risk Factor Category  - - - (No Data) High Fall Risk  Comment - - - is improved this year from last  -  Risk for fall due to : Impaired balance/gait;Impaired mobility;History of fall(s);Impaired vision - - - History of fall(s);Impaired mobility  Follow up Falls prevention discussed - - Education provided Education provided  Comment - - - - had PT but did not help     Any stairs in or around the home? Yes  If so, are there  any without handrails? No  Home free of loose throw rugs in walkways, pet beds, electrical cords, etc? Yes  Adequate lighting in your home to reduce risk of falls? Yes   ASSISTIVE DEVICES UTILIZED TO PREVENT FALLS:  Life alert? No  Use of a cane, walker or w/c? Yes  Grab bars in the bathroom? Yes  Shower chair or bench in shower? Yes  Elevated toilet seat or a handicapped toilet? Yes   TIMED UP AND GO:  Was the test performed? No .   Cognitive Function: MMSE - Mini Mental State Exam 05/21/2018 04/27/2017  Not completed: (No Data) -  Orientation to time - 5  Orientation to Place - 5  Registration - 3  Attention/ Calculation - 4  Recall - 2  Language- name 2 objects - 2  Language- repeat - 1  Language- follow 3 step command - 3  Language- read & follow direction - 1  Write a sentence - 1  Copy design - 1  Total score - 28   Montreal Cognitive Assessment  02/22/2020  Visuospatial/ Executive (0/5) 3  Naming (0/3) 3  Attention: Read list of digits (0/2) 1  Attention: Read list of letters (0/1) 1  Attention: Serial 7 subtraction starting at 100 (0/3) 1  Language: Repeat phrase (0/2) 2  Language : Fluency (0/1) 0  Abstraction (0/2) 2  Delayed Recall (0/5) 0  Orientation (0/6) 6  Total 19  Adjusted Score (based on education) 19   6CIT Screen 09/21/2020  What Year? 0 points  What month? 0 points  Count back from 20 0 points  Months in reverse 2 points  Repeat phrase 6 points    Immunizations Immunization History  Administered Date(s) Administered  . Fluad Quad(high Dose 65+) 08/06/2019  . Influenza Split 10/06/2011, 09/13/2012  . Influenza Whole 09/11/2007, 09/22/2008, 09/13/2009, 09/26/2010  . Influenza, High Dose Seasonal PF 09/10/2015, 09/01/2016, 08/02/2017, 09/10/2018  . Influenza,inj,Quad PF,6+ Mos 08/13/2013, 08/21/2014  . PFIZER SARS-COV-2 Vaccination 12/27/2019, 01/17/2020  . Pneumococcal Conjugate-13 11/23/2014  . Pneumococcal Polysaccharide-23  12/11/1997, 09/13/2009  . Td 12/24/2007  . Tdap 08/25/2014  . Zoster 01/03/2007  . Zoster Recombinat (Shingrix) 06/15/2017, 09/10/2017    TDAP status: Up to date Flu Vaccine status: Declined, Education has been provided regarding the importance of this vaccine but patient still declined. Advised may receive this vaccine at local pharmacy or Health Dept. Aware to provide a copy of the vaccination record if obtained from local pharmacy or Health Dept. Verbalized acceptance and understanding. pt will get Flu vaccine at 09/22/20 visit Pneumococcal vaccine status: Up to date Covid-19 vaccine status: Completed vaccines  Qualifies for Shingles Vaccine? Yes   Zostavax completed Yes   Shingrix Completed?: Yes  Screening Tests Health Maintenance  Topic Date Due  . FOOT EXAM  05/22/2019  . INFLUENZA VACCINE  07/11/2020  . OPHTHALMOLOGY EXAM  11/16/2020  . HEMOGLOBIN A1C  03/15/2021  . TETANUS/TDAP  08/25/2024  . COVID-19 Vaccine  Completed  . PNA vac Low Risk Adult  Completed    Health Maintenance  Health Maintenance Due  Topic Date Due  . FOOT EXAM  05/22/2019  . INFLUENZA VACCINE  07/11/2020    Colorectal cancer screening: No longer required.     Additional Screening:    Vision Screening: Recommended annual ophthalmology exams for early detection of glaucoma and other disorders of the eye. Is the patient up to date with their annual eye exam?  Yes  Who is the provider or what is the name of the office in which the patient attends annual eye exams? Dr Alois Cliche   Dental Screening: Recommended annual dental exams for proper oral hygiene  Community Resource Referral / Chronic Care Management: CRR required this visit?  No   CCM required this visit?  No      Plan:     I have personally reviewed and noted the following in the patient's chart:   . Medical and social history . Use of alcohol, tobacco or illicit drugs  . Current medications and  supplements . Functional ability and status . Nutritional status . Physical activity . Advanced directives . List of other physicians . Hospitalizations, surgeries, and ER visits in previous 12 months . Vitals . Screenings to include cognitive, depression, and falls . Referrals and appointments  In addition, I have reviewed and discussed with patient certain preventive protocols, quality metrics, and best practice recommendations. A written personalized care plan for preventive services as well as general preventive health recommendations were provided to patient.     Willette Brace, LPN   [id number]   Nurse Notes: None

## 2020-09-22 ENCOUNTER — Encounter: Payer: Self-pay | Admitting: Family Medicine

## 2020-09-22 ENCOUNTER — Ambulatory Visit (INDEPENDENT_AMBULATORY_CARE_PROVIDER_SITE_OTHER): Payer: Medicare PPO | Admitting: Family Medicine

## 2020-09-22 VITALS — BP 124/70 | HR 92 | Temp 98.4°F | Ht 70.0 in | Wt 181.3 lb

## 2020-09-22 DIAGNOSIS — Z23 Encounter for immunization: Secondary | ICD-10-CM | POA: Diagnosis not present

## 2020-09-22 DIAGNOSIS — I2583 Coronary atherosclerosis due to lipid rich plaque: Secondary | ICD-10-CM

## 2020-09-22 DIAGNOSIS — K219 Gastro-esophageal reflux disease without esophagitis: Secondary | ICD-10-CM

## 2020-09-22 DIAGNOSIS — E559 Vitamin D deficiency, unspecified: Secondary | ICD-10-CM

## 2020-09-22 DIAGNOSIS — E1169 Type 2 diabetes mellitus with other specified complication: Secondary | ICD-10-CM

## 2020-09-22 DIAGNOSIS — I5032 Chronic diastolic (congestive) heart failure: Secondary | ICD-10-CM

## 2020-09-22 DIAGNOSIS — G3184 Mild cognitive impairment, so stated: Secondary | ICD-10-CM

## 2020-09-22 DIAGNOSIS — I6381 Other cerebral infarction due to occlusion or stenosis of small artery: Secondary | ICD-10-CM

## 2020-09-22 DIAGNOSIS — E785 Hyperlipidemia, unspecified: Secondary | ICD-10-CM

## 2020-09-22 DIAGNOSIS — E611 Iron deficiency: Secondary | ICD-10-CM | POA: Diagnosis not present

## 2020-09-22 DIAGNOSIS — E1142 Type 2 diabetes mellitus with diabetic polyneuropathy: Secondary | ICD-10-CM | POA: Diagnosis not present

## 2020-09-22 DIAGNOSIS — Z794 Long term (current) use of insulin: Secondary | ICD-10-CM

## 2020-09-22 DIAGNOSIS — E1159 Type 2 diabetes mellitus with other circulatory complications: Secondary | ICD-10-CM | POA: Diagnosis not present

## 2020-09-22 DIAGNOSIS — I2510 Atherosclerotic heart disease of native coronary artery without angina pectoris: Secondary | ICD-10-CM | POA: Diagnosis not present

## 2020-09-22 DIAGNOSIS — E538 Deficiency of other specified B group vitamins: Secondary | ICD-10-CM

## 2020-09-22 DIAGNOSIS — I152 Hypertension secondary to endocrine disorders: Secondary | ICD-10-CM

## 2020-09-22 NOTE — Addendum Note (Signed)
Addended by: Agnes Lawrence on: 09/22/2020 09:13 AM   Modules accepted: Orders

## 2020-09-22 NOTE — Progress Notes (Signed)
[name] DOB: 1933-04-10 Encounter date: 09/22/2020  This is a 86 y.o. male who presents with Chief Complaint  Patient presents with  . Follow-up    History of present illness:  He doesn't have specific worries today. Follows up with cardiology later this month.   Blood sugar control has been better. Last A1C was 6.6. not having low sugar issues. Occasionally will have "low" sugar before breakfast but this is in the 70's-80's.   HTN: does occasionally check at home. Has been good on visits. Making sure to use walker. Stopped otc dizzy pills and did ok without these.  Hypertension associated with diabetes ([place]) Blood pressure somewhat low today.  I am going to decrease his amlodipine to half tablet daily.  Encouraged him to monitor at home.  Given balance issues, I feel it is best to not have him be hypotensive. He is taking the whole 80m of amlodipine now.     Chronic diastolic congestive heart failure ([place]: feet are a little swollen; but not too bad. Not uncomfortable. Breathing has been ok.     Gastroesophageal reflux disease, unspecified whether esophagitis present Well-controlled on omeprazole.  Continue current medication.   DDD (degenerative disc disease), cervical Following with neurosurgery.  Also had carpal tunnel release. Recovering from this (done august 4th). Hands still get some numbness; weakness, esp with overuse.    Hyperlipidemia associated with type 2 diabetes mellitus ([place] - Lipid panel; Future  Sleep: still with some loud verbalizations from sleep. Moving/jerking in sleep from recliner.   Memory loss: he is taking aricept for mild memory loss. He is no longer driving.   Iron stores were low on bloodwork; he has started iron supplement.   Allergies  Allergen Reactions  . Ciprofloxacin Other (See Comments)    "made me have a weird feeling. Disoriented."   . Sulfamethoxazole Rash   Current Meds  Medication Sig  . Accu-Chek Softclix Lancets  lancets Use to check blood sugar 5 times a day.  . Acetaminophen (TYLENOL ARTHRITIS PAIN PO) Take 625 mg by mouth 3 (three) times daily.   .Marland KitchenamLODipine (NORVASC) 5 MG tablet TAKE ONE TABLET BY MOUTH DAILY  . aspirin 81 MG tablet Take 81 mg by mouth daily.   . B-D UF III MINI PEN NEEDLES 31G X 5 MM MISC USE TO TEST BLOOD SUGAR FOUR TIMES A DAY  . Blood Glucose Monitoring Suppl (ACCU-CHEK GUIDE) w/Device KIT 1 kit by Does not apply route See admin instructions. Check blood sugar 3 times a day  . donepezil (ARICEPT) 5 MG tablet Take 1/2 tablet daily for 2 weeks, then increase to 1 tablet daily  . ferrous sulfate 325 (65 FE) MG tablet Take 325 mg by mouth every other day.   . furosemide (LASIX) 40 MG tablet Take 1 tablet (40 mg total) by mouth daily.  .Marland Kitchenglucose blood (ACCU-CHEK GUIDE) test strip Use to check blood sugar 3 times a day  . HUMALOG KWIKPEN 100 UNIT/ML KwikPen INJECT 6 TO 10 UNITS INTO THE SKIN BEFORE EACH MEAL THREE TIMES A DAY  . insulin detemir (LEVEMIR FLEXTOUCH) 100 UNIT/ML FlexPen INJECT 20 UNITS INTO THE SKIN DAILY AT 10PM  . Magnesium 200 MG TABS Take 1 tablet (200 mg total) by mouth daily.  . metFORMIN (GLUCOPHAGE) 1000 MG tablet TAKE ONE TABLET BY MOUTH TWICE A DAY WITH MEALS  . Multiple Vitamins-Minerals (CENTRUM SILVER PO) Take 1 tablet by mouth daily.   .Marland Kitchenomeprazole (PRILOSEC) 20 MG capsule TAKE ONE  CAPSULE BY MOUTH DAILY  . OVER THE COUNTER MEDICATION   . pravastatin (PRAVACHOL) 40 MG tablet TAKE ONE TABLET BY MOUTH EVERY EVENING  . Probiotic Product (PROBIOTIC PO) Take by mouth.  . Propylene Glycol (SYSTANE BALANCE) 0.6 % SOLN Apply to eye. 2 drops each eye once daily  . vitamin B-12 (CYANOCOBALAMIN) 1000 MCG tablet Take 1,000 mcg by mouth daily.    Review of Systems  Constitutional: Negative for chills, fatigue and fever.  Respiratory: Negative for cough, chest tightness, shortness of breath and wheezing.   Cardiovascular: Negative for chest pain, palpitations and  leg swelling.    Objective:  BP 124/70 (BP Location: Right Arm, Patient Position: Sitting, Cuff Size: Normal)   Pulse 92   Temp 98.4 F (36.9 C) (Oral)   Ht 5' 10"  (1.778 m)   Wt 181 lb 4.8 oz (82.2 kg)   BMI 26.01 kg/m   Weight: 181 lb 4.8 oz (82.2 kg)   BP Readings from Last 3 Encounters:  09/22/20 124/70  08/30/20 120/68  08/25/20 (!) 164/62   Wt Readings from Last 3 Encounters:  09/22/20 181 lb 4.8 oz (82.2 kg)  09/21/20 178 lb (80.7 kg)  08/30/20 182 lb 6.4 oz (82.7 kg)    Physical Exam Constitutional:      General: He is not in acute distress.    Appearance: He is well-developed.  Cardiovascular:     Rate and Rhythm: Normal rate. Rhythm irregular. Frequent extrasystoles are present.    Heart sounds: Normal heart sounds. No murmur heard.  No friction rub.  Pulmonary:     Effort: Pulmonary effort is normal. No respiratory distress.     Breath sounds: Normal breath sounds. No wheezing or rales.  Musculoskeletal:     Right lower leg: No edema.     Left lower leg: No edema.  Neurological:     Mental Status: He is alert and oriented to person, place, and time.  Psychiatric:        Behavior: Behavior normal.     Assessment/Plan  1. Hypertension associated with diabetes Well controlled. Continue amlodipine 83m daily.  2. Coronary artery disease due to lipid rich plaque Follows with cardiology; on pravastatin, htn well controlled.   3. Chronic diastolic congestive heart failure Follows with cardiology; lasix regularly 23m no edema/sob.   4. Gastroesophageal reflux disease, unspecified whether esophagitis present Well controlled with omeprazole 2029mContinue this.  5. Type 2 diabetes mellitus with long-term current use of insulin Following with endo. Sugars have been well controlled with most recent A1C at 6.8. humalog 6-10 units, levemir 20 units, metformin 1000m15m6. Hyperlipidemia associated with type 2 diabetes mellitus Has been well controlled with  pravastatin.  7. Multiple lacunar infarcts Noted on prior imaging. Memory and mood have been stable. Following with neurology. On aricept. Sleeping well.   8. Mild neurocognitive disorder See above. He is no longer driving.   Return in about 6 months (around 03/23/2021) for Chronic condition visit.        JuneMicheline Rough

## 2020-09-24 NOTE — Progress Notes (Signed)
HPI: FUCAD and diastolic CHF. ABIs 3/14 normal. Seen previously for chest pain, dyspnea and lower ext edema. Echocardiogram September 2015 showed normal LV function, grade 1 diastolic dysfunction and mild mitral regurgitation. Moderate left atrial enlargement. Cardiac catheterization September 2015 showed a 30% left main, 40-50% LAD and no obstructive disease in the circumflex or right coronary artery. PCWP 4. PA pressure 19/4. Monitor January 2019 showed sinus to sinus bradycardia with PVCs and 3 beats nonsustained ventricular tachycardia. Nuclear study February 2019 showed ejection fraction 55% with no ischemia or infarction. Blood pressure medications were reduced previously because of dizziness. Monitor October 2021 showed sinus rhythm with PACs, brief PAT, PVCs and 4 beats nonsustained ventricular tachycardia.  Since last seen,his dyspnea, chest pain or syncope.  Occasional brief palpitations.  Current Outpatient Medications  Medication Sig Dispense Refill  . Accu-Chek Softclix Lancets lancets Use to check blood sugar 5 times a day. 500 each 12  . Acetaminophen (TYLENOL ARTHRITIS PAIN PO) Take 625 mg by mouth 3 (three) times daily.     Marland Kitchen amLODipine (NORVASC) 5 MG tablet TAKE ONE TABLET BY MOUTH DAILY (Patient taking differently: Take 2.5 mg by mouth daily. ) 90 tablet 0  . aspirin 81 MG tablet Take 81 mg by mouth daily.     . B-D UF III MINI PEN NEEDLES 31G X 5 MM MISC USE TO TEST BLOOD SUGAR FOUR TIMES A DAY 200 each 11  . Blood Glucose Monitoring Suppl (ACCU-CHEK GUIDE) w/Device KIT 1 kit by Does not apply route See admin instructions. Check blood sugar 3 times a day 1 kit 0  . donepezil (ARICEPT) 5 MG tablet Take 1/2 tablet daily for 2 weeks, then increase to 1 tablet daily 30 tablet 11  . ferrous sulfate 325 (65 FE) MG tablet Take 325 mg by mouth every other day.     . furosemide (LASIX) 40 MG tablet Take 1 tablet (40 mg total) by mouth daily. 90 tablet 1  . glucose blood  (ACCU-CHEK GUIDE) test strip Use to check blood sugar 3 times a day 270 each 12  . HUMALOG KWIKPEN 100 UNIT/ML KwikPen INJECT 6 TO 10 UNITS INTO THE SKIN BEFORE EACH MEAL THREE TIMES A DAY 27 mL 2  . insulin detemir (LEVEMIR FLEXTOUCH) 100 UNIT/ML FlexPen INJECT 20 UNITS INTO THE SKIN DAILY AT 10PM 15 mL 2  . Magnesium 200 MG TABS Take 1 tablet (200 mg total) by mouth daily. 30 each 11  . metFORMIN (GLUCOPHAGE) 1000 MG tablet TAKE ONE TABLET BY MOUTH TWICE A DAY WITH MEALS 180 tablet 0  . Multiple Vitamins-Minerals (CENTRUM SILVER PO) Take 1 tablet by mouth daily.     Marland Kitchen omeprazole (PRILOSEC) 20 MG capsule TAKE ONE CAPSULE BY MOUTH DAILY 90 capsule 0  . OVER THE COUNTER MEDICATION     . pravastatin (PRAVACHOL) 40 MG tablet TAKE ONE TABLET BY MOUTH EVERY EVENING 90 tablet 3  . Probiotic Product (PROBIOTIC PO) Take by mouth.    . Propylene Glycol (SYSTANE BALANCE) 0.6 % SOLN Apply to eye. 2 drops each eye once daily    . vitamin B-12 (CYANOCOBALAMIN) 1000 MCG tablet Take 1,000 mcg by mouth daily.     No current facility-administered medications for this visit.     Past Medical History:  Diagnosis Date  . Anemia   . Atherosclerotic PVD with intermittent claudication   . CAD (coronary artery disease)   . Chronic diastolic congestive heart failure 11/23/2014  . DDD (degenerative  disc disease), cervical 08/02/2017  . Diverticulosis   . DVT (deep venous thrombosis)   . Edema 08/19/2014  . GERD (gastroesophageal reflux disease)    hx hiatal hernia  . History of pneumonia 2002  . History of skin cancer 07/30/2019   Follows with skin surgery center  . Hyperlipidemia associated with type 2 diabetes mellitus 05/03/2017  . Hypertension associated with type 2 diabetes mellitus 08/20/2007  . IBS (irritable bowel syndrome)   . Mild neurocognitive disorder 07/07/2020  . Multiple lacunar infarcts    Two in the right basal ganglia occurring between 2016 and 2021  . OAB (overactive bladder) 03/26/2015    History of stress incontinence  . Osteoarthritis   . Peripheral neuropathy associated with type 2 diabetes mellitus 05/30/2010  . Shingles   . Type 2 diabetes mellitus with long-term current use of insulin 05/03/2017  . Varicose veins of lower extremities with other complications 66/29/4765    Past Surgical History:  Procedure Laterality Date  . BACK SURGERY    . CATARACT EXTRACTION Bilateral 08/10/2015,08/15/2015  . COLONOSCOPY     Hx: of  . CTS bilateral    . KNEE ARTHROSCOPY  1988, 1992   bilateral  . LEFT AND RIGHT HEART CATHETERIZATION WITH CORONARY ANGIOGRAM N/A 09/03/2014   Procedure: LEFT AND RIGHT HEART CATHETERIZATION WITH CORONARY ANGIOGRAM;  Surgeon: [name], MD;  Location: [facility] CATH LAB;  Service: Cardiovascular;  Laterality: N/A;  . LUMBAR LAMINECTOMY/DECOMPRESSION MICRODISCECTOMY N/A 08/18/2013   Procedure: Lumbar two-three, lumbar three-four, lumbar four-five decompressive laminectomies;  Surgeon: [name], MD;  Location: [place] NEURO ORS;  Service: Neurosurgery;  Laterality: N/A;  . TONSILLECTOMY    . TOTAL KNEE ARTHROPLASTY Bilateral 2005, 2007    Social History   Socioeconomic History  . Marital status: Married    Spouse name: Not on file  . Number of children: 2  . Years of education: 30  . Highest education level: High school graduate  Occupational History  . Occupation: Retired  Tobacco Use  . Smoking status: Never Smoker  . Smokeless tobacco: Never Used  Vaping Use  . Vaping Use: Never used  Substance and Sexual Activity  . Alcohol use: No    Alcohol/week: 0.0 standard drinks  . Drug use: No  . Sexual activity: Not on file  Other Topics Concern  . 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 Difficulty of Paying Living Expenses: Not hard at all  Food Insecurity: No Food Insecurity  . Worried About Charity fundraiser in the Last Year: Never true  . Ran Out of Food in the Last Year: Never true  Transportation Needs: No Transportation Needs  . Lack of Transportation (Medical): No  . Lack of Transportation (Non-Medical): No  Physical Activity: Inactive  . Days of Exercise per Week: 0 days  . Minutes of Exercise per Session: 0 min  Stress: No Stress Concern Present  . Feeling of Stress : Not at all  Social Connections: Moderately  Integrated  . Frequency of Communication with Friends and Family: More than three times a week  . Frequency of Social Gatherings with Friends and Family: Once a week  . Attends Religious Services: More than 4 times per year  . Active Member of Clubs or Organizations: No  . Attends Archivist Meetings: Never  . Marital Status: Married  Human resources officer Violence: Not At Risk  . Fear of Current or Ex-Partner: No  . Emotionally Abused: No  . Physically Abused: No  . Sexually Abused: No    Family History  Problem Relation Age of Onset  . Other Mother 77       CABG  . Heart disease Mother   . Hypertension Mother   . Dementia Father        Cognitive and behavioral changes following anesthesia  . Hyperlipidemia Son     ROS: no fevers or chills, productive cough, hemoptysis, dysphasia, odynophagia, melena, hematochezia, dysuria, hematuria, rash, seizure activity, orthopnea, PND, pedal edema, claudication. Remaining systems are negative.  Physical Exam: Well-developed well-nourished in no acute distress.  Skin is warm and dry.  HEENT is normal.  Neck is supple.  Chest is clear to auscultation with normal expansion.  Cardiovascular exam is regular rate and rhythm.  3/6 systolic murmur left  sternal border. Abdominal exam nontender or distended. No masses palpated. Extremities show no edema. neuro grossly intact   A/P  1 coronary artery disease-patient doing well from a symptomatic standpoint with no chest pain.  Continue medical therapy with aspirin and statin.  2 hyperlipidemia-continue statin.  3 hypertension-blood pressure controlled.  Continue present medical regimen and follow.  4 chronic diastolic congestive heart failure-he is euvolemic today on examination.  Continue diuretic at present dose.  5 murmur-patient sounds to have aortic stenosis on examination.  Schedule echocardiogram to better assess.  6 palpitations-patient noted to have PACs, PVCs and nonsustained ventricular tachycardia on recent monitor.  We'll add Toprol 12.5 mg daily and follow.  Kirk Ruths, MD

## 2020-09-27 DIAGNOSIS — M9901 Segmental and somatic dysfunction of cervical region: Secondary | ICD-10-CM | POA: Diagnosis not present

## 2020-09-27 DIAGNOSIS — M5031 Other cervical disc degeneration,  high cervical region: Secondary | ICD-10-CM | POA: Diagnosis not present

## 2020-09-29 DIAGNOSIS — M5031 Other cervical disc degeneration,  high cervical region: Secondary | ICD-10-CM | POA: Diagnosis not present

## 2020-09-29 DIAGNOSIS — M9901 Segmental and somatic dysfunction of cervical region: Secondary | ICD-10-CM | POA: Diagnosis not present

## 2020-09-30 ENCOUNTER — Ambulatory Visit: Payer: Medicare PPO | Admitting: Cardiology

## 2020-09-30 ENCOUNTER — Encounter: Payer: Self-pay | Admitting: Cardiology

## 2020-09-30 ENCOUNTER — Other Ambulatory Visit: Payer: Self-pay

## 2020-09-30 VITALS — BP 118/60 | HR 60 | Ht 70.0 in | Wt 182.1 lb

## 2020-09-30 DIAGNOSIS — I350 Nonrheumatic aortic (valve) stenosis: Secondary | ICD-10-CM

## 2020-09-30 DIAGNOSIS — I2510 Atherosclerotic heart disease of native coronary artery without angina pectoris: Secondary | ICD-10-CM | POA: Diagnosis not present

## 2020-09-30 DIAGNOSIS — E785 Hyperlipidemia, unspecified: Secondary | ICD-10-CM

## 2020-09-30 DIAGNOSIS — I10 Essential (primary) hypertension: Secondary | ICD-10-CM

## 2020-09-30 DIAGNOSIS — I493 Ventricular premature depolarization: Secondary | ICD-10-CM | POA: Diagnosis not present

## 2020-09-30 DIAGNOSIS — E1169 Type 2 diabetes mellitus with other specified complication: Secondary | ICD-10-CM | POA: Diagnosis not present

## 2020-09-30 MED ORDER — METOPROLOL SUCCINATE ER 25 MG PO TB24: 12.5000 mg | ORAL_TABLET | Freq: Every day | ORAL | 3 refills | Status: AC

## 2020-09-30 NOTE — Patient Instructions (Signed)
Medication Instructions:   START METOPROLOL SUCC ER 12.5 MG ONCE DAILY= 1/2 OF THE 25 MG TABLETS ONCE DAILY  *If you need a refill on your cardiac medications before your next appointment, please call your pharmacy*  Testing/Procedures:  Your physician has requested that you have an echocardiogram. Echocardiography is a painless test that uses sound waves to create images of your heart. It provides your doctor with information about the size and shape of your heart and how well your heart's chambers and valves are working. This procedure takes approximately one hour. There are no restrictions for this procedure.[facility]   Follow-Up: At [facility], you and your health needs are our priority.  As part of our continuing mission to provide you with exceptional heart care, we have created designated Provider Care Teams.  These Care Teams include your primary Cardiologist (physician) and Advanced Practice Providers (APPs -  Physician Assistants and Nurse Practitioners) who all work together to provide you with the care you need, when you need it.  We recommend signing up for the patient portal called "MyChart".  Sign up information is provided on this After Visit Summary.  MyChart is used to connect with patients for Virtual Visits (Telemedicine).  Patients are able to view lab/test results, encounter notes, upcoming appointments, etc.  Non-urgent messages can be sent to your provider as well.   To learn more about what you can do with MyChart, go to [email or web address].    Your next appointment:   6 month(s)  The format for your next appointment:   In Person  Provider:   Kirk Ruths, MD

## 2020-10-05 DIAGNOSIS — M5031 Other cervical disc degeneration,  high cervical region: Secondary | ICD-10-CM | POA: Diagnosis not present

## 2020-10-05 DIAGNOSIS — M9901 Segmental and somatic dysfunction of cervical region: Secondary | ICD-10-CM | POA: Diagnosis not present

## 2020-10-13 DIAGNOSIS — M9901 Segmental and somatic dysfunction of cervical region: Secondary | ICD-10-CM | POA: Diagnosis not present

## 2020-10-13 DIAGNOSIS — M5031 Other cervical disc degeneration,  high cervical region: Secondary | ICD-10-CM | POA: Diagnosis not present

## 2020-10-18 DIAGNOSIS — M9901 Segmental and somatic dysfunction of cervical region: Secondary | ICD-10-CM | POA: Diagnosis not present

## 2020-10-18 DIAGNOSIS — M5031 Other cervical disc degeneration,  high cervical region: Secondary | ICD-10-CM | POA: Diagnosis not present

## 2020-10-20 DIAGNOSIS — M9901 Segmental and somatic dysfunction of cervical region: Secondary | ICD-10-CM | POA: Diagnosis not present

## 2020-10-20 DIAGNOSIS — M5031 Other cervical disc degeneration,  high cervical region: Secondary | ICD-10-CM | POA: Diagnosis not present

## 2020-10-26 ENCOUNTER — Other Ambulatory Visit: Payer: Self-pay

## 2020-10-26 ENCOUNTER — Ambulatory Visit (HOSPITAL_COMMUNITY): Payer: Medicare PPO | Attending: Cardiovascular Disease

## 2020-10-26 DIAGNOSIS — I493 Ventricular premature depolarization: Secondary | ICD-10-CM | POA: Diagnosis not present

## 2020-10-26 DIAGNOSIS — I350 Nonrheumatic aortic (valve) stenosis: Secondary | ICD-10-CM

## 2020-10-26 LAB — ECHOCARDIOGRAM COMPLETE
AR max vel: 1.25 cm2
AV Area VTI: 1.12 cm2
AV Area mean vel: 1.13 cm2
AV Mean grad: 17 mmHg
AV Peak grad: 29.4 mmHg
Ao pk vel: 2.71 m/s
Area-P 1/2: 3.8 cm2
S' Lateral: 3.5 cm

## 2020-10-28 DIAGNOSIS — M5031 Other cervical disc degeneration,  high cervical region: Secondary | ICD-10-CM | POA: Diagnosis not present

## 2020-10-28 DIAGNOSIS — M9901 Segmental and somatic dysfunction of cervical region: Secondary | ICD-10-CM | POA: Diagnosis not present

## 2020-11-02 DIAGNOSIS — M5031 Other cervical disc degeneration,  high cervical region: Secondary | ICD-10-CM | POA: Diagnosis not present

## 2020-11-02 DIAGNOSIS — M9901 Segmental and somatic dysfunction of cervical region: Secondary | ICD-10-CM | POA: Diagnosis not present

## 2020-11-09 DIAGNOSIS — M5031 Other cervical disc degeneration,  high cervical region: Secondary | ICD-10-CM | POA: Diagnosis not present

## 2020-11-09 DIAGNOSIS — M9901 Segmental and somatic dysfunction of cervical region: Secondary | ICD-10-CM | POA: Diagnosis not present

## 2020-11-11 ENCOUNTER — Other Ambulatory Visit: Payer: Self-pay | Admitting: Internal Medicine

## 2020-11-11 ENCOUNTER — Other Ambulatory Visit: Payer: Self-pay | Admitting: Family Medicine

## 2020-11-15 DIAGNOSIS — M5031 Other cervical disc degeneration,  high cervical region: Secondary | ICD-10-CM | POA: Diagnosis not present

## 2020-11-15 DIAGNOSIS — M9901 Segmental and somatic dysfunction of cervical region: Secondary | ICD-10-CM | POA: Diagnosis not present

## 2020-11-16 DIAGNOSIS — E119 Type 2 diabetes mellitus without complications: Secondary | ICD-10-CM | POA: Diagnosis not present

## 2020-11-16 LAB — HM DIABETES EYE EXAM

## 2020-11-19 DIAGNOSIS — G5623 Lesion of ulnar nerve, bilateral upper limbs: Secondary | ICD-10-CM | POA: Diagnosis not present

## 2020-11-19 DIAGNOSIS — G5603 Carpal tunnel syndrome, bilateral upper limbs: Secondary | ICD-10-CM | POA: Diagnosis not present

## 2020-11-19 DIAGNOSIS — R030 Elevated blood-pressure reading, without diagnosis of hypertension: Secondary | ICD-10-CM | POA: Diagnosis not present

## 2020-11-19 DIAGNOSIS — M47812 Spondylosis without myelopathy or radiculopathy, cervical region: Secondary | ICD-10-CM | POA: Diagnosis not present

## 2020-11-25 DIAGNOSIS — M5031 Other cervical disc degeneration,  high cervical region: Secondary | ICD-10-CM | POA: Diagnosis not present

## 2020-11-25 DIAGNOSIS — M9901 Segmental and somatic dysfunction of cervical region: Secondary | ICD-10-CM | POA: Diagnosis not present

## 2020-11-26 DIAGNOSIS — L603 Nail dystrophy: Secondary | ICD-10-CM | POA: Diagnosis not present

## 2020-11-26 DIAGNOSIS — E1151 Type 2 diabetes mellitus with diabetic peripheral angiopathy without gangrene: Secondary | ICD-10-CM | POA: Diagnosis not present

## 2020-11-26 DIAGNOSIS — I739 Peripheral vascular disease, unspecified: Secondary | ICD-10-CM | POA: Diagnosis not present

## 2020-11-29 ENCOUNTER — Ambulatory Visit (HOSPITAL_COMMUNITY)
Admission: EM | Admit: 2020-11-29 | Discharge: 2020-11-29 | Disposition: A | Discharge status: Home / Self Care | Payer: Medicare PPO | Attending: Internal Medicine | Admitting: Internal Medicine

## 2020-11-29 ENCOUNTER — Inpatient Hospital Stay (HOSPITAL_COMMUNITY)
Admission: EM | Admit: 2020-11-29 | Discharge: 2020-12-11 | DRG: 286 | Disposition: E | Payer: Medicare PPO | Attending: Internal Medicine | Admitting: Internal Medicine

## 2020-11-29 ENCOUNTER — Telehealth: Payer: Self-pay | Admitting: Internal Medicine

## 2020-11-29 ENCOUNTER — Telehealth: Payer: Self-pay | Admitting: Family Medicine

## 2020-11-29 ENCOUNTER — Encounter (HOSPITAL_COMMUNITY): Payer: Self-pay

## 2020-11-29 ENCOUNTER — Other Ambulatory Visit: Payer: Self-pay

## 2020-11-29 ENCOUNTER — Emergency Department (HOSPITAL_COMMUNITY): Payer: Medicare PPO

## 2020-11-29 DIAGNOSIS — R079 Chest pain, unspecified: Secondary | ICD-10-CM | POA: Diagnosis not present

## 2020-11-29 DIAGNOSIS — Z452 Encounter for adjustment and management of vascular access device: Secondary | ICD-10-CM | POA: Diagnosis not present

## 2020-11-29 DIAGNOSIS — I10 Essential (primary) hypertension: Secondary | ICD-10-CM | POA: Diagnosis not present

## 2020-11-29 DIAGNOSIS — Y95 Nosocomial condition: Secondary | ICD-10-CM | POA: Diagnosis not present

## 2020-11-29 DIAGNOSIS — G9341 Metabolic encephalopathy: Secondary | ICD-10-CM | POA: Diagnosis not present

## 2020-11-29 DIAGNOSIS — R570 Cardiogenic shock: Secondary | ICD-10-CM | POA: Diagnosis not present

## 2020-11-29 DIAGNOSIS — R0602 Shortness of breath: Secondary | ICD-10-CM

## 2020-11-29 DIAGNOSIS — I462 Cardiac arrest due to underlying cardiac condition: Secondary | ICD-10-CM | POA: Diagnosis not present

## 2020-11-29 DIAGNOSIS — R0902 Hypoxemia: Secondary | ICD-10-CM | POA: Diagnosis not present

## 2020-11-29 DIAGNOSIS — Z4682 Encounter for fitting and adjustment of non-vascular catheter: Secondary | ICD-10-CM | POA: Diagnosis not present

## 2020-11-29 DIAGNOSIS — J9601 Acute respiratory failure with hypoxia: Secondary | ICD-10-CM | POA: Diagnosis not present

## 2020-11-29 DIAGNOSIS — Z8673 Personal history of transient ischemic attack (TIA), and cerebral infarction without residual deficits: Secondary | ICD-10-CM

## 2020-11-29 DIAGNOSIS — E873 Alkalosis: Secondary | ICD-10-CM | POA: Diagnosis not present

## 2020-11-29 DIAGNOSIS — Z6826 Body mass index (BMI) 26.0-26.9, adult: Secondary | ICD-10-CM

## 2020-11-29 DIAGNOSIS — R6521 Severe sepsis with septic shock: Secondary | ICD-10-CM | POA: Diagnosis not present

## 2020-11-29 DIAGNOSIS — E1169 Type 2 diabetes mellitus with other specified complication: Secondary | ICD-10-CM | POA: Diagnosis present

## 2020-11-29 DIAGNOSIS — M1990 Unspecified osteoarthritis, unspecified site: Secondary | ICD-10-CM | POA: Diagnosis present

## 2020-11-29 DIAGNOSIS — E11649 Type 2 diabetes mellitus with hypoglycemia without coma: Secondary | ICD-10-CM | POA: Diagnosis not present

## 2020-11-29 DIAGNOSIS — J9602 Acute respiratory failure with hypercapnia: Secondary | ICD-10-CM

## 2020-11-29 DIAGNOSIS — I472 Ventricular tachycardia: Secondary | ICD-10-CM | POA: Diagnosis not present

## 2020-11-29 DIAGNOSIS — Z20822 Contact with and (suspected) exposure to covid-19: Secondary | ICD-10-CM | POA: Diagnosis present

## 2020-11-29 DIAGNOSIS — Z515 Encounter for palliative care: Secondary | ICD-10-CM | POA: Diagnosis not present

## 2020-11-29 DIAGNOSIS — I340 Nonrheumatic mitral (valve) insufficiency: Secondary | ICD-10-CM | POA: Diagnosis present

## 2020-11-29 DIAGNOSIS — Z0189 Encounter for other specified special examinations: Secondary | ICD-10-CM

## 2020-11-29 DIAGNOSIS — Z882 Allergy status to sulfonamides status: Secondary | ICD-10-CM

## 2020-11-29 DIAGNOSIS — G3183 Dementia with Lewy bodies: Secondary | ICD-10-CM | POA: Diagnosis present

## 2020-11-29 DIAGNOSIS — I5043 Acute on chronic combined systolic (congestive) and diastolic (congestive) heart failure: Secondary | ICD-10-CM | POA: Diagnosis present

## 2020-11-29 DIAGNOSIS — I200 Unstable angina: Secondary | ICD-10-CM | POA: Diagnosis not present

## 2020-11-29 DIAGNOSIS — I350 Nonrheumatic aortic (valve) stenosis: Secondary | ICD-10-CM | POA: Diagnosis present

## 2020-11-29 DIAGNOSIS — F0280 Dementia in other diseases classified elsewhere without behavioral disturbance: Secondary | ICD-10-CM | POA: Diagnosis present

## 2020-11-29 DIAGNOSIS — R0789 Other chest pain: Secondary | ICD-10-CM | POA: Diagnosis not present

## 2020-11-29 DIAGNOSIS — Z818 Family history of other mental and behavioral disorders: Secondary | ICD-10-CM

## 2020-11-29 DIAGNOSIS — E440 Moderate protein-calorie malnutrition: Secondary | ICD-10-CM | POA: Diagnosis present

## 2020-11-29 DIAGNOSIS — E785 Hyperlipidemia, unspecified: Secondary | ICD-10-CM | POA: Diagnosis present

## 2020-11-29 DIAGNOSIS — Z8249 Family history of ischemic heart disease and other diseases of the circulatory system: Secondary | ICD-10-CM

## 2020-11-29 DIAGNOSIS — K219 Gastro-esophageal reflux disease without esophagitis: Secondary | ICD-10-CM | POA: Diagnosis present

## 2020-11-29 DIAGNOSIS — E1165 Type 2 diabetes mellitus with hyperglycemia: Secondary | ICD-10-CM | POA: Diagnosis not present

## 2020-11-29 DIAGNOSIS — I4891 Unspecified atrial fibrillation: Secondary | ICD-10-CM

## 2020-11-29 DIAGNOSIS — I110 Hypertensive heart disease with heart failure: Secondary | ICD-10-CM | POA: Diagnosis present

## 2020-11-29 DIAGNOSIS — E876 Hypokalemia: Secondary | ICD-10-CM | POA: Diagnosis not present

## 2020-11-29 DIAGNOSIS — A419 Sepsis, unspecified organism: Secondary | ICD-10-CM | POA: Diagnosis not present

## 2020-11-29 DIAGNOSIS — I5021 Acute systolic (congestive) heart failure: Secondary | ICD-10-CM | POA: Diagnosis not present

## 2020-11-29 DIAGNOSIS — Z4659 Encounter for fitting and adjustment of other gastrointestinal appliance and device: Secondary | ICD-10-CM

## 2020-11-29 DIAGNOSIS — Z66 Do not resuscitate: Secondary | ICD-10-CM | POA: Diagnosis not present

## 2020-11-29 DIAGNOSIS — I25110 Atherosclerotic heart disease of native coronary artery with unstable angina pectoris: Secondary | ICD-10-CM | POA: Diagnosis present

## 2020-11-29 DIAGNOSIS — R778 Other specified abnormalities of plasma proteins: Secondary | ICD-10-CM | POA: Diagnosis not present

## 2020-11-29 DIAGNOSIS — K581 Irritable bowel syndrome with constipation: Secondary | ICD-10-CM | POA: Diagnosis present

## 2020-11-29 DIAGNOSIS — I2510 Atherosclerotic heart disease of native coronary artery without angina pectoris: Secondary | ICD-10-CM | POA: Diagnosis present

## 2020-11-29 DIAGNOSIS — E1142 Type 2 diabetes mellitus with diabetic polyneuropathy: Secondary | ICD-10-CM | POA: Diagnosis present

## 2020-11-29 DIAGNOSIS — I469 Cardiac arrest, cause unspecified: Secondary | ICD-10-CM | POA: Diagnosis not present

## 2020-11-29 DIAGNOSIS — Z79899 Other long term (current) drug therapy: Secondary | ICD-10-CM

## 2020-11-29 DIAGNOSIS — I428 Other cardiomyopathies: Secondary | ICD-10-CM | POA: Diagnosis present

## 2020-11-29 DIAGNOSIS — J9811 Atelectasis: Secondary | ICD-10-CM | POA: Diagnosis not present

## 2020-11-29 DIAGNOSIS — R410 Disorientation, unspecified: Secondary | ICD-10-CM | POA: Diagnosis not present

## 2020-11-29 DIAGNOSIS — Z7982 Long term (current) use of aspirin: Secondary | ICD-10-CM

## 2020-11-29 DIAGNOSIS — N179 Acute kidney failure, unspecified: Secondary | ICD-10-CM | POA: Diagnosis not present

## 2020-11-29 DIAGNOSIS — R451 Restlessness and agitation: Secondary | ICD-10-CM | POA: Diagnosis not present

## 2020-11-29 DIAGNOSIS — J90 Pleural effusion, not elsewhere classified: Secondary | ICD-10-CM | POA: Diagnosis not present

## 2020-11-29 DIAGNOSIS — I5023 Acute on chronic systolic (congestive) heart failure: Secondary | ICD-10-CM | POA: Diagnosis not present

## 2020-11-29 DIAGNOSIS — E875 Hyperkalemia: Secondary | ICD-10-CM | POA: Diagnosis not present

## 2020-11-29 DIAGNOSIS — K449 Diaphragmatic hernia without obstruction or gangrene: Secondary | ICD-10-CM | POA: Diagnosis present

## 2020-11-29 DIAGNOSIS — Z7984 Long term (current) use of oral hypoglycemic drugs: Secondary | ICD-10-CM

## 2020-11-29 DIAGNOSIS — I2583 Coronary atherosclerosis due to lipid rich plaque: Secondary | ICD-10-CM | POA: Diagnosis not present

## 2020-11-29 DIAGNOSIS — Z22322 Carrier or suspected carrier of Methicillin resistant Staphylococcus aureus: Secondary | ICD-10-CM

## 2020-11-29 DIAGNOSIS — I4729 Other ventricular tachycardia: Secondary | ICD-10-CM

## 2020-11-29 DIAGNOSIS — Z96653 Presence of artificial knee joint, bilateral: Secondary | ICD-10-CM | POA: Diagnosis present

## 2020-11-29 DIAGNOSIS — D539 Nutritional anemia, unspecified: Secondary | ICD-10-CM | POA: Diagnosis present

## 2020-11-29 DIAGNOSIS — I248 Other forms of acute ischemic heart disease: Secondary | ICD-10-CM | POA: Diagnosis present

## 2020-11-29 DIAGNOSIS — J9690 Respiratory failure, unspecified, unspecified whether with hypoxia or hypercapnia: Secondary | ICD-10-CM | POA: Diagnosis not present

## 2020-11-29 DIAGNOSIS — J189 Pneumonia, unspecified organism: Secondary | ICD-10-CM | POA: Diagnosis not present

## 2020-11-29 DIAGNOSIS — R5383 Other fatigue: Secondary | ICD-10-CM

## 2020-11-29 DIAGNOSIS — R54 Age-related physical debility: Secondary | ICD-10-CM | POA: Diagnosis present

## 2020-11-29 DIAGNOSIS — Z881 Allergy status to other antibiotic agents status: Secondary | ICD-10-CM

## 2020-11-29 DIAGNOSIS — R4182 Altered mental status, unspecified: Secondary | ICD-10-CM | POA: Diagnosis not present

## 2020-11-29 DIAGNOSIS — I4819 Other persistent atrial fibrillation: Secondary | ICD-10-CM | POA: Diagnosis not present

## 2020-11-29 DIAGNOSIS — I517 Cardiomegaly: Secondary | ICD-10-CM | POA: Diagnosis not present

## 2020-11-29 DIAGNOSIS — Z86718 Personal history of other venous thrombosis and embolism: Secondary | ICD-10-CM

## 2020-11-29 DIAGNOSIS — I5082 Biventricular heart failure: Secondary | ICD-10-CM | POA: Diagnosis not present

## 2020-11-29 DIAGNOSIS — I509 Heart failure, unspecified: Secondary | ICD-10-CM

## 2020-11-29 DIAGNOSIS — Z794 Long term (current) use of insulin: Secondary | ICD-10-CM

## 2020-11-29 DIAGNOSIS — J811 Chronic pulmonary edema: Secondary | ICD-10-CM | POA: Diagnosis not present

## 2020-11-29 DIAGNOSIS — I6782 Cerebral ischemia: Secondary | ICD-10-CM | POA: Diagnosis not present

## 2020-11-29 DIAGNOSIS — G9389 Other specified disorders of brain: Secondary | ICD-10-CM | POA: Diagnosis not present

## 2020-11-29 DIAGNOSIS — E1151 Type 2 diabetes mellitus with diabetic peripheral angiopathy without gangrene: Secondary | ICD-10-CM | POA: Diagnosis present

## 2020-11-29 DIAGNOSIS — I5033 Acute on chronic diastolic (congestive) heart failure: Secondary | ICD-10-CM | POA: Diagnosis not present

## 2020-11-29 DIAGNOSIS — D509 Iron deficiency anemia, unspecified: Secondary | ICD-10-CM | POA: Diagnosis present

## 2020-11-29 DIAGNOSIS — Z85828 Personal history of other malignant neoplasm of skin: Secondary | ICD-10-CM

## 2020-11-29 HISTORY — DX: Other ill-defined heart diseases: I51.89

## 2020-11-29 HISTORY — DX: Nonrheumatic aortic (valve) stenosis: I35.0

## 2020-11-29 HISTORY — DX: Ventricular premature depolarization: I49.3

## 2020-11-29 LAB — BASIC METABOLIC PANEL
Anion gap: 10 (ref 5–15)
BUN: 29 mg/dL — ABNORMAL HIGH (ref 8–23)
CO2: 24 mmol/L (ref 22–32)
Calcium: 9.4 mg/dL (ref 8.9–10.3)
Chloride: 104 mmol/L (ref 98–111)
Creatinine, Ser: 1.1 mg/dL (ref 0.61–1.24)
GFR, Estimated: >60 mL/min (ref >60)
Glucose, Bld: 231 mg/dL — ABNORMAL HIGH (ref 70–99)
Potassium: 4.5 mmol/L (ref 3.5–5.1)
Sodium: 138 mmol/L (ref 135–145)

## 2020-11-29 LAB — CBG MONITORING, ED
Glucose-Capillary: 94 mg/dL (ref 70–99)
Glucose-Capillary: 157 mg/dL — ABNORMAL HIGH (ref 70–99)

## 2020-11-29 LAB — TROPONIN I (HIGH SENSITIVITY)
Troponin I (High Sensitivity): 809 ng/L (ref <18)
Troponin I (High Sensitivity): 795 ng/L (ref <18)
Troponin I (High Sensitivity): 970 ng/L (ref <18)

## 2020-11-29 LAB — CBC
HCT: 36.9 % — ABNORMAL LOW (ref 39.0–52.0)
Hemoglobin: 11 g/dL — ABNORMAL LOW (ref 13.0–17.0)
MCH: 30 pg (ref 26.0–34.0)
MCHC: 29.8 g/dL — ABNORMAL LOW (ref 30.0–36.0)
MCV: 100.5 fL — ABNORMAL HIGH (ref 80.0–100.0)
Platelets: 207 10*3/uL (ref 150–400)
RBC: 3.67 MIL/uL — ABNORMAL LOW (ref 4.22–5.81)
RDW: 14.5 % (ref 11.5–15.5)
WBC: 8.1 10*3/uL (ref 4.0–10.5)
nRBC: 0 % (ref 0.0–0.2)

## 2020-11-29 LAB — CBC WITH DIFFERENTIAL/PLATELET
Abs Immature Granulocytes: 0.03 10*3/uL (ref 0.00–0.07)
Basophils Absolute: 0 10*3/uL (ref 0.0–0.1)
Basophils Relative: 0 %
Eosinophils Absolute: 0 10*3/uL (ref 0.0–0.5)
Eosinophils Relative: 0 %
HCT: 35.2 % — ABNORMAL LOW (ref 39.0–52.0)
Hemoglobin: 10.6 g/dL — ABNORMAL LOW (ref 13.0–17.0)
Immature Granulocytes: 0 %
Lymphocytes Relative: 40 %
Lymphs Abs: 3.1 10*3/uL (ref 0.7–4.0)
MCH: 30.1 pg (ref 26.0–34.0)
MCHC: 30.1 g/dL (ref 30.0–36.0)
MCV: 100 fL (ref 80.0–100.0)
Monocytes Absolute: 0.4 10*3/uL (ref 0.1–1.0)
Monocytes Relative: 5 %
Neutro Abs: 4.2 10*3/uL (ref 1.7–7.7)
Neutrophils Relative %: 55 %
Platelets: 193 10*3/uL (ref 150–400)
RBC: 3.52 MIL/uL — ABNORMAL LOW (ref 4.22–5.81)
RDW: 14.6 % (ref 11.5–15.5)
WBC: 7.7 10*3/uL (ref 4.0–10.5)
nRBC: 0 % (ref 0.0–0.2)

## 2020-11-29 LAB — HEMOGLOBIN A1C
Hgb A1c MFr Bld: 7 % — ABNORMAL HIGH (ref 4.8–5.6)
Mean Plasma Glucose: 154.2 mg/dL

## 2020-11-29 LAB — RESP PANEL BY RT-PCR (FLU A&B, COVID) ARPGX2
Influenza A by PCR: NEGATIVE
Influenza B by PCR: NEGATIVE
SARS Coronavirus 2 by RT PCR: NEGATIVE

## 2020-11-29 LAB — HEPATIC FUNCTION PANEL
ALT: 22 U/L (ref 0–44)
AST: 25 U/L (ref 15–41)
Albumin: 3.7 g/dL (ref 3.5–5.0)
Alkaline Phosphatase: 34 U/L — ABNORMAL LOW (ref 38–126)
Bilirubin, Direct: 0.1 mg/dL (ref 0.0–0.2)
Indirect Bilirubin: 0.9 mg/dL (ref 0.3–0.9)
Total Bilirubin: 1 mg/dL (ref 0.3–1.2)
Total Protein: 6.4 g/dL — ABNORMAL LOW (ref 6.5–8.1)

## 2020-11-29 LAB — TSH: TSH: 1.37 u[IU]/mL (ref 0.350–4.500)

## 2020-11-29 LAB — BRAIN NATRIURETIC PEPTIDE: B Natriuretic Peptide: 975.6 pg/mL — ABNORMAL HIGH (ref 0.0–100.0)

## 2020-11-29 LAB — MAGNESIUM: Magnesium: 1.6 mg/dL — ABNORMAL LOW (ref 1.7–2.4)

## 2020-11-29 MED ORDER — SODIUM CHLORIDE 0.9 % IV SOLN
INTRAVENOUS | Status: DC
Start: 2020-11-30 — End: 2020-11-30

## 2020-11-29 MED ORDER — PROPOFOL 1000 MG/100ML IV EMUL: 5.0000 ug/kg/min | INTRAVENOUS | Status: DC

## 2020-11-29 MED ORDER — INSULIN DETEMIR 100 UNIT/ML ~~LOC~~ SOLN
15.0000 [IU] | Freq: Every day | SUBCUTANEOUS | Status: DC
  Administered 2020-11-29 – 2020-11-30 (×2): 15 [IU] via SUBCUTANEOUS
  Filled 2020-11-29 (×3): qty 0.15

## 2020-11-29 MED ORDER — MAGNESIUM SULFATE 2 GM/50ML IV SOLN
2.0000 g | Freq: Once | INTRAVENOUS | Status: AC
  Administered 2020-11-29: 19:00:00 2 g via INTRAVENOUS
  Filled 2020-11-29: qty 50

## 2020-11-29 MED ORDER — INSULIN ASPART 100 UNIT/ML ~~LOC~~ SOLN
0.0000 [IU] | Freq: Three times a day (TID) | SUBCUTANEOUS | Status: DC
  Administered 2020-11-30 (×2): 3 [IU] via SUBCUTANEOUS
  Administered 2020-12-01: 12:00:00 5 [IU] via SUBCUTANEOUS
  Administered 2020-12-01: 16:00:00 3 [IU] via SUBCUTANEOUS
  Administered 2020-12-02: 13:00:00 1 [IU] via SUBCUTANEOUS
  Administered 2020-12-02: 08:00:00 2 [IU] via SUBCUTANEOUS

## 2020-11-29 MED ORDER — HEPARIN BOLUS VIA INFUSION
4000.0000 [IU] | Freq: Once | INTRAVENOUS | Status: AC
  Administered 2020-11-29: 21:00:00 4000 [IU] via INTRAVENOUS
  Filled 2020-11-29: qty 4000

## 2020-11-29 MED ORDER — SODIUM CHLORIDE 0.9 % IV SOLN: 250.0000 mL | INTRAVENOUS | Status: DC | PRN

## 2020-11-29 MED ORDER — SENNOSIDES-DOCUSATE SODIUM 8.6-50 MG PO TABS
1.0000 | ORAL_TABLET | Freq: Two times a day (BID) | ORAL | Status: DC
  Administered 2020-11-30 (×2): 1 via ORAL
  Filled 2020-11-29 (×2): qty 1

## 2020-11-29 MED ORDER — INSULIN ASPART 100 UNIT/ML ~~LOC~~ SOLN
0.0000 [IU] | Freq: Every day | SUBCUTANEOUS | Status: DC
  Administered 2020-11-30: 22:00:00 2 [IU] via SUBCUTANEOUS

## 2020-11-29 MED ORDER — DILTIAZEM HCL-DEXTROSE 125-5 MG/125ML-% IV SOLN (PREMIX)
5.0000 mg/h | INTRAVENOUS | Status: DC
  Administered 2020-11-29: 21:00:00 5 mg/h via INTRAVENOUS
  Administered 2020-11-30: 06:00:00 15 mg/h via INTRAVENOUS
  Filled 2020-11-29 (×2): qty 125

## 2020-11-29 MED ORDER — ACETAMINOPHEN ER 650 MG PO TBCR: 1300.0000 mg | EXTENDED_RELEASE_TABLET | Freq: Three times a day (TID) | ORAL | Status: DC

## 2020-11-29 MED ORDER — FUROSEMIDE 10 MG/ML IJ SOLN
40.0000 mg | Freq: Two times a day (BID) | INTRAMUSCULAR | Status: DC
  Administered 2020-11-29 – 2020-11-30 (×3): 40 mg via INTRAVENOUS
  Filled 2020-11-29 (×3): qty 4

## 2020-11-29 MED ORDER — DONEPEZIL HCL 5 MG PO TABS
5.0000 mg | ORAL_TABLET | Freq: Every morning | ORAL | Status: DC
  Administered 2020-11-30 – 2020-12-02 (×3): 5 mg via ORAL
  Filled 2020-11-29 (×4): qty 1

## 2020-11-29 MED ORDER — ASPIRIN EC 81 MG PO TBEC
81.0000 mg | DELAYED_RELEASE_TABLET | Freq: Every day | ORAL | Status: DC
  Administered 2020-11-30: 22:00:00 81 mg via ORAL
  Filled 2020-11-29: qty 1

## 2020-11-29 MED ORDER — METOPROLOL TARTRATE 5 MG/5ML IV SOLN
5.0000 mg | Freq: Once | INTRAVENOUS | Status: AC
  Administered 2020-11-29: 17:00:00 5 mg via INTRAVENOUS
  Filled 2020-11-29: qty 5

## 2020-11-29 MED ORDER — HEPARIN (PORCINE) 25000 UT/250ML-% IV SOLN
1350.0000 [IU]/h | INTRAVENOUS | Status: DC
  Administered 2020-11-29: 21:00:00 1200 [IU]/h via INTRAVENOUS
  Filled 2020-11-29: qty 250

## 2020-11-29 MED ORDER — ACETAMINOPHEN 500 MG PO TABS
1000.0000 mg | ORAL_TABLET | Freq: Three times a day (TID) | ORAL | Status: DC
  Administered 2020-11-29 – 2020-11-30 (×4): 1000 mg via ORAL
  Filled 2020-11-29 (×4): qty 2

## 2020-11-29 MED ORDER — ASPIRIN 81 MG PO CHEW
324.0000 mg | CHEWABLE_TABLET | Freq: Once | ORAL | Status: AC
  Administered 2020-11-29: 19:00:00 324 mg via ORAL
  Filled 2020-11-29: qty 4

## 2020-11-29 MED ORDER — PRAVASTATIN SODIUM 40 MG PO TABS
40.0000 mg | ORAL_TABLET | Freq: Every evening | ORAL | Status: DC
  Administered 2020-11-29 – 2020-11-30 (×2): 40 mg via ORAL
  Filled 2020-11-29 (×2): qty 1

## 2020-11-29 MED ORDER — DILTIAZEM LOAD VIA INFUSION
10.0000 mg | Freq: Once | INTRAVENOUS | Status: AC
  Administered 2020-11-29: 21:00:00 10 mg via INTRAVENOUS
  Filled 2020-11-29: qty 10

## 2020-11-29 MED ORDER — SODIUM CHLORIDE 0.9% FLUSH: 3.0000 mL | INTRAVENOUS | Status: DC | PRN

## 2020-11-29 MED ORDER — FERROUS SULFATE 325 (65 FE) MG PO TABS
325.0000 mg | ORAL_TABLET | ORAL | Status: DC
  Filled 2020-11-29: qty 1

## 2020-11-29 MED ORDER — INSULIN ASPART 100 UNIT/ML ~~LOC~~ SOLN
5.0000 [IU] | Freq: Once | SUBCUTANEOUS | Status: AC
  Administered 2020-11-29: 17:00:00 5 [IU] via SUBCUTANEOUS

## 2020-11-29 MED ORDER — SODIUM CHLORIDE 0.9% FLUSH
3.0000 mL | Freq: Two times a day (BID) | INTRAVENOUS | Status: DC
  Administered 2020-11-30 – 2020-12-04 (×6): 3 mL via INTRAVENOUS

## 2020-11-29 MED ORDER — METOPROLOL SUCCINATE ER 25 MG PO TB24
12.5000 mg | ORAL_TABLET | Freq: Every day | ORAL | Status: DC
  Administered 2020-11-30: 08:00:00 12.5 mg via ORAL
  Filled 2020-11-29: qty 1

## 2020-11-29 MED ORDER — ASPIRIN 81 MG PO CHEW
81.0000 mg | CHEWABLE_TABLET | ORAL | Status: AC
  Administered 2020-11-30: 06:00:00 81 mg via ORAL
  Filled 2020-11-29: qty 1

## 2020-11-29 MED ORDER — PANTOPRAZOLE SODIUM 40 MG PO TBEC
40.0000 mg | DELAYED_RELEASE_TABLET | Freq: Every day | ORAL | Status: DC
  Administered 2020-11-30: 08:00:00 40 mg via ORAL
  Filled 2020-11-29: qty 1

## 2020-11-29 MED ORDER — SENNA-DOCUSATE SODIUM 8.6-50 MG PO TABS: 1.0000 | ORAL_TABLET | ORAL | Status: DC

## 2020-11-29 NOTE — Telephone Encounter (Signed)
Patient requests to be called at ph# [phone] re: Patient states he is weak and his blood sugars have been fluctuating (sometimes too high and sometimes too low).

## 2020-11-29 NOTE — Discharge Instructions (Addendum)
Go to the ER.

## 2020-11-29 NOTE — ED Notes (Signed)
Notified [facility] of need for transport

## 2020-11-29 NOTE — H&P (View-Only) (Signed)
Cardiology Consult    Patient ID: [name] MRN: [id number], DOB/AGE: 1933-02-28   Admit date: 12/02/2020 Date of Consult: 11/20/2020  Primary Physician: Caren Macadam, MD Primary Cardiologist: Kirk Ruths, MD Requesting Provider: Fulton Reek, MD  Patient Profile    [name] is a 86 y.o. male with a history of nonobs CAD, HTN, HL, PVCs, diast dysfxn, lacunar infarcts, DMII, GERD, anemia, and remote DVT (approximately 50 years ago), who is being seen today for the evaluation of rapid afib and demand ischemia at the request of Dr. Dina Rich.  Past Medical History   Past Medical History:  Diagnosis Date  . Anemia   . Atherosclerotic PVD with intermittent claudication   . Chronic diastolic congestive heart failure 11/23/2014  . DDD (degenerative disc disease), cervical 08/02/2017  . Diastolic dysfunction    a. 10/2020 Echo: EF 55-60%, no rwma, Gr2 DD, RVSP ~ 33.61mHg. Mod dil LA, mildly dil RA. Mild MR. Mod AS.  .Marland KitchenDiverticulosis   . DVT (deep venous thrombosis)   . Edema 08/19/2014  . GERD (gastroesophageal reflux disease)    hx hiatal hernia  . History of pneumonia 2002  . History of skin cancer 07/30/2019   Follows with skin surgery center  . Hyperlipidemia associated with type 2 diabetes mellitus 05/03/2017  . Hypertension associated with type 2 diabetes mellitus 08/20/2007  . IBS (irritable bowel syndrome)   . Mild neurocognitive disorder 07/07/2020  . Moderate aortic stenosis    a. 10/2020 Echo: Mod AS.  . Multiple lacunar infarcts    Two in the right basal ganglia occurring between 2016 and 2021  . Non-obstructive CAD (coronary artery disease)    a. 08/2014 Cath: LM 30, LAD 40-50, LCX nl, RCA nl. PCWP 4, PA 19/4; b. 01/2018 MV: EF 55-65%, no ischemia/infarct.  .Marland KitchenOAB (overactive bladder) 03/26/2015   History of stress incontinence  . Osteoarthritis   . Peripheral neuropathy associated with type 2 diabetes mellitus 05/30/2010  . PVC's (premature  ventricular contractions)    a. 09/2020 Zio: sinus brady/RSR, PACs, brief PAT, PVCs, 4 beats NSVT.  .Marland KitchenShingles   . Type 2 diabetes mellitus with long-term current use of insulin 05/03/2017  . Varicose veins of lower extremities with other complications 061/44/3154   Past Surgical History:  Procedure Laterality Date  . BACK SURGERY    . CATARACT EXTRACTION Bilateral 08/10/2015,08/15/2015  . COLONOSCOPY     Hx: of  . CTS bilateral    . KNEE ARTHROSCOPY  1988, 1992   bilateral  . LEFT AND RIGHT HEART CATHETERIZATION WITH CORONARY ANGIOGRAM N/A 09/03/2014   Procedure: LEFT AND RIGHT HEART CATHETERIZATION WITH CORONARY ANGIOGRAM;  Surgeon: MBlane Ohara MD;  [facility];  Service: Cardiovascular;  Laterality: N/A;  . LUMBAR LAMINECTOMY/DECOMPRESSION MICRODISCECTOMY N/A 08/18/2013   Procedure: Lumbar two-three, lumbar three-four, lumbar four-five decompressive laminectomies;  Surgeon: JOphelia Charter MD;  [facility];  Service: Neurosurgery;  Laterality: N/A;  . TONSILLECTOMY    . TOTAL KNEE ARTHROPLASTY Bilateral 2005, 2007     Allergies  Allergies  Allergen Reactions  . Ciprofloxacin Other (See Comments)    "made me have a weird feeling- felt disoriented.."  . Sulfamethoxazole Rash    History of Present Illness    86year old male with above past medical history including nonobstructive CAD, hypertension, hyperlipidemia, PVCs, diastolic dysfunction, lacunar infarct, type 2 diabetes mellitus, GERD, anemia, and remote DVT.  Previous cardiac evaluation includes diagnostic cardiac catheterization September 2015  revealing moderate nonobstructive left main and LAD disease.  More recently in February 2019, stress testing showed no ischemia or infarct.  In the setting of a history of PVCs and palpitations, he underwent Zio monitoring October this year which showed sinus bradycardia/sinus rhythm, PACs, brief PAT, PVCs, and 4 beats of nonsustained VT.  Palpitations have been  managed with oral beta-blocker therapy.  At recent clinic visit in October, he was noted to have a systolic murmur and echocardiogram was carried out in November revealing an EF of 78-29%, grade 2 diastolic dysfunction, mild MR, and moderate aortic stenosis.  [name] was in his usual state of health until the evening of Friday, December 17, when he had sudden onset of lightheadedness and dyspnea on exertion noted when walking to his bathroom.  After walking only about 20 to 25 feet, he felt as though his legs were becoming very weak and that he might lose consciousness.  He also noted mild chest discomfort while walking.  Once he arrived in the bathroom, he sat on a stool and symptoms resolved within a few minutes.  Since then however, every time he has gotten up to do anything he has had recurrence of presyncope, weakness, dyspnea, and mild chest tightness.  Symptoms always abate within a few minutes with rest and he has not had any resting chest pain or dyspnea.  He checked his blood pressure this morning and noted it to be relatively normal but also noted his heart rate to be elevated greater than 100.  Due to ongoing symptoms, he presented to urgent care where he was found to be in A. fib with RVR.  He was transferred via EMS to the ED.  On arrival here, his rate was 140 bpm.  Rates currently trending in the 1 teens to 120s after receiving 5 mg of IV metoprolol and he is currently asymptomatic.  Labs are notable for a high-sensitivity troponin of 809  795, BNP of 975.6, and magnesium of 1.6.  Magnesium is currently being supplemented.  Chest x-ray is consistent with mild edema.  Covid testing is negative.  Home Medications    . diltiazem  10 mg Intravenous Once    Prior to Admission medications   Medication Sig Start Date End Date Taking? Authorizing Provider  acetaminophen (TYLENOL) 650 MG CR tablet Take 1,300 mg by mouth 3 (three) times daily.   Yes [provider]  amLODipine (NORVASC)  5 MG tablet TAKE ONE TABLET BY MOUTH DAILY Patient taking differently: Take 5 mg by mouth daily. 09/06/20  Yes Koberlein, Steele Berg, MD  aspirin 81 MG tablet Take 81 mg by mouth at bedtime.   Yes [provider]  donepezil (ARICEPT) 5 MG tablet Take 1/2 tablet daily for 2 weeks, then increase to 1 tablet daily Patient taking differently: Take 5 mg by mouth in the morning. 06/24/20  Yes Cameron Sprang, MD  ferrous sulfate 325 (65 FE) MG tablet Take 325 mg by mouth every other day.    Yes [provider]  furosemide (LASIX) 40 MG tablet TAKE ONE TABLET BY MOUTH DAILY Patient taking differently: Take 40 mg by mouth in the morning. 11/11/20  Yes Koberlein, Steele Berg, MD  HUMALOG KWIKPEN 100 UNIT/ML KwikPen INJECT 6 TO 10 UNITS INTO THE SKIN BEFORE EACH MEAL THREE TIMES A DAY Patient taking differently: Inject 6-10 Units into the skin 3 (three) times daily before meals. 04/16/20  Yes Philemon Kingdom, MD  insulin detemir (LEVEMIR FLEXTOUCH) 100 UNIT/ML FlexPen  INJECT 20 UNITS INTO THE SKIN DAILY AT 10PM Patient taking differently: Inject 14-16 Units into the skin See admin instructions. Inject 14-16 units into the skin at 10 PM 09/06/20  Yes Philemon Kingdom, MD  Magnesium 200 MG TABS Take 1 tablet (200 mg total) by mouth daily. 12/17/17  Yes Lendon Colonel, NP  metFORMIN (GLUCOPHAGE) 1000 MG tablet TAKE ONE TABLET BY MOUTH TWICE A DAY WITH MEALS Patient taking differently: Take 1,000 mg by mouth 2 (two) times daily with a meal. 11/11/20  Yes Philemon Kingdom, MD  metoprolol succinate (TOPROL XL) 25 MG 24 hr tablet Take 0.5 tablets (12.5 mg total) by mouth daily. Patient taking differently: Take 12.5 mg by mouth at bedtime. 09/30/20  Yes Lelon Perla, MD  Multiple Vitamins-Minerals (CENTRUM SILVER PO) Take 1 tablet by mouth daily.    Yes [provider]  NON FORMULARY Place 1-2 sprays into both nostrils See admin instructions. [employer] Simply Saline Nasal Relief-  Instill 1-2 sprays into each nostril one to two times a day   Yes [provider]  omeprazole (PRILOSEC) 20 MG capsule TAKE ONE CAPSULE BY MOUTH DAILY Patient taking differently: Take 20 mg by mouth daily before breakfast. 08/30/20  Yes Koberlein, Junell C, MD  pravastatin (PRAVACHOL) 40 MG tablet TAKE ONE TABLET BY MOUTH EVERY EVENING Patient taking differently: Take 40 mg by mouth every evening. 12/26/19  Yes Lelon Perla, MD  Probiotic Product (PROBIOTIC PO) Take 1 capsule by mouth 3 (three) times a week.   Yes [provider]  Propylene Glycol 0.6 % SOLN Place 2 drops into both eyes See admin instructions. Instill 2 drops into both eyes one to two times a day   Yes [provider]  sennosides-docusate sodium (SENOKOT-S) 8.6-50 MG tablet Take 1 tablet by mouth in the morning and at bedtime.   Yes [provider]  vitamin B-12 (CYANOCOBALAMIN) 1000 MCG tablet Take 1,000 mcg by mouth every other day.   Yes [provider]  Accu-Chek Softclix Lancets lancets Use to check blood sugar 5 times a day. 03/30/20   Philemon Kingdom, MD  B-D UF III MINI PEN NEEDLES 31G X 5 MM MISC USE TO TEST BLOOD SUGAR FOUR TIMES A DAY Patient taking differently: 4 (four) times daily. 12/01/19   Philemon Kingdom, MD  Blood Glucose Monitoring Suppl (ACCU-CHEK GUIDE) w/Device KIT 1 kit by Does not apply route See admin instructions. Check blood sugar 3 times a day 01/26/20   Philemon Kingdom, MD  glucose blood (ACCU-CHEK GUIDE) test strip Use to check blood sugar 3 times a day 03/19/20   Philemon Kingdom, MD     Family History    Family History  Problem Relation Age of Onset  . Other Mother 85       CABG  . Heart disease Mother   . Hypertension Mother   . Dementia Father        Cognitive and behavioral changes following anesthesia  . Hyperlipidemia Son    He indicated that his mother is deceased. He indicated that his father is deceased. He indicated that his  maternal grandmother is deceased. He indicated that his maternal grandfather is deceased. He indicated that his paternal grandmother is deceased. He indicated that his paternal grandfather is deceased. He indicated that the status of his son is unknown.   Social History    Social History   Socioeconomic History  . Marital status: Married    Spouse name: Not on  file  . Number of children: 2  . Years of education: 103  . Highest education level: High school graduate  Occupational History  . Occupation: Retired  Tobacco Use  . Smoking status: Never Smoker  . Smokeless tobacco: Never Used  Vaping Use  . Vaping Use: Never used  Substance and Sexual Activity  . Alcohol use: No    Alcohol/week: 0.0 standard drinks  . Drug use: No  . Sexual activity: Not on file  Other Topics Concern  . 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 Difficulty of Paying Living Expenses: Not hard at all  Food Insecurity: No Food Insecurity  . Worried About Charity fundraiser in the Last Year: Never true  . Ran Out of Food in the Last Year: Never true  Transportation Needs: No Transportation Needs  . Lack of Transportation (Medical): No  . Lack of Transportation (Non-Medical): No  Physical Activity: Inactive  . Days of Exercise per Week: 0 days  . Minutes of Exercise per Session: 0 min  Stress: No Stress Concern Present  . Feeling of Stress : Not at all  Social Connections: Moderately Integrated  . Frequency of Communication with Friends and Family: More than three times a week  . Frequency of Social Gatherings with  Friends and Family: Once a week  . Attends Religious Services: More than 4 times per year  . Active Member of Clubs or Organizations: No  . Attends Archivist Meetings: Never  . Marital Status: Married  Human resources officer Violence: Not At Risk  . Fear of Current or Ex-Partner: No  . Emotionally Abused: No  . Physically Abused: No  . Sexually Abused: No     Review of Systems    General:  No chills, fever, night sweats or weight changes.  Cardiovascular:  +++ chest pain, +++ dyspnea on exertion, +++ mild chronic LE edema, no orthopnea, palpitations, paroxysmal nocturnal dyspnea. Dermatological: No rash, lesions/masses Respiratory: +++ cough Friday night but has since resolved, +++ dyspnea Urologic: No hematuria, dysuria Abdominal:   Poor appetite since Friday 12/17.  No nausea, vomiting, diarrhea, bright red blood per rectum, melena, or hematemesis Neurologic:  No visual changes, wkns, changes in mental status. All other systems reviewed and are otherwise negative except as noted above.  Physical Exam    Blood pressure 126/86, pulse (!) 57, resp. rate (!) 24, height 5' 10"  (1.778 m), weight 82.6 kg, SpO2 92 %.  General: Pleasant, NAD Psych: Normal affect. Neuro: Alert and oriented X 3. Moves all extremities spontaneously. HEENT: Normal  Neck: Supple without bruits.  Mildly elevated JVP. Lungs:  Resp regular and unlabored, Scatt exp wheezing.  Bibasilar crackles. Heart: IR, IR, no s3, s4.  2/6 SEM loudest @ upper sternal borders. Abdomen: Soft, non-tender, non-distended, BS + x 4.  Extremities: No clubbing, cyanosis or edema. DP/PT2+, Radials 2+ and equal bilaterally.  Labs    Cardiac Enzymes Recent Labs  Lab 12/04/2020 1620 11/25/2020 1810  TROPONINIHS 809*  795*      Lab Results  Component Value Date   WBC 8.1 12/06/2020   HGB 11.0 (L) 11/23/2020   HCT 36.9 (L) 12/09/2020   MCV 100.5 (H) 12/08/2020   PLT 207 11/12/2020    Recent Labs  Lab 11/18/2020 1620  NA  138  K 4.5  CL 104  CO2 24  BUN 29*  CREATININE 1.10  CALCIUM 9.4  PROT 6.4*  BILITOT 1.0  ALKPHOS 34*  ALT 22  AST 25  GLUCOSE 231*   Lab Results  Component Value Date   CHOL 120 09/14/2020   HDL 56 09/14/2020   LDLCALC 49 09/14/2020   TRIG 72 09/14/2020    Radiology Studies    DG Chest 1 View  Result Date: 12/10/2020 CLINICAL DATA:  cp EXAM: CHEST  1 VIEW COMPARISON:  08/26/2015 and prior. FINDINGS: No pneumothorax. Small bilateral pleural effusions and patchy basilar opacities. Cardiomegaly and central pulmonary vascular congestion. Multilevel spondylosis. IMPRESSION: Cardiomegaly and central pulmonary vascular congestion. Bibasilar opacities and small pleural effusions, edema versus infection. Electronically Signed   By: Primitivo Gauze M.D.   On: 11/22/2020 16:46   ECG & Cardiac Imaging    Afib, 140, leftward axis, ? LAFB, PVCs, ant infarct, minimal lat ST depression - personally reviewed.  Assessment & Plan    1.  Afib RVR: Patient presented to the emergency department with a 3-day history of progressive exertional presyncope/orthostasis, dyspnea, and chest tightness.  He initially presented to an urgent care and was found to be in atrial fibrillation with rapid ventricular response.  He denies palpitations and is unsure as to how long his heart rates have been elevated (140 on arrival).  He received 5 mg of IV metoprolol in the ED and rates are currently trending in the 1 teens to 120s.  He is not symptomatic at rest.  No prior h/o AFib (recent Zio monitor did not show any A. fib).  CHA2DS2-VASc equals 8.  We will add intravenous heparin for the time being given elevated troponins and need for ischemic evaluation (possibly cath).  I will also add IV diltiazem for the time being.  Continue home dose of metoprolol.  If he cannot be adequately rate controlled and ends up requiring cardioversion, as we do not know the duration of his atrial fibrillation, TEE would be  required.  Plan eventual Eliquis.  Discussed with patient and wife.  No prior history of GI bleeding.  2.  Unstable angina/demand Ischemia/nonobstructive CAD: Patient with a history of nonobstructive CAD by catheterization 2015 with low risk Myoview in 2019.  He has been having intermittent exertional chest pain, dyspnea, and presyncope throughout the weekend and in the setting of rapid atrial fibrillation today, was found to have an initial HsTrop elevated at 809 with follow-up of 795.  Follow troponin trend with low threshold to pursue diagnostic catheterization.  Will heparinize.  Continue home doses of aspirin, beta-blocker, and statin therapy.  3.  Acute on chronic diastolic congestive heart failure: Patient with recent echo showing normal LV function and grade 2 diastolic dysfunction.  He has been having significant dyspnea on exertion over the weekend and does appear mildly volume overloaded today.  He does have crackles on examination and chest x-ray shows bibasilar opacities.  Suspect rapid A. fib driving diastolic dysfunction and volume overload.  Will order Lasix 40 IV twice daily to start now.  Can hopefully convert to oral tomorrow, especially if adequate rate control or rhythm control achieved.  Heart  rate stable.  4.  HTN:  Stable.  5.  HL:  LDL 49 in Oct.  Cont statin rx.  6.  Type 2 diabetes mellitus: Patient notes swings between hypoglycemia and hyperglycemia at home.  Management per internal medicine.  7.  Hypomagnesemia:  Supplementation provided.  F/u Mg in AM.  8.  Macrocytic anemia: stable.  No prior history of bleeding.  We discussed plan to initiate oral anticoagulation in the setting of #1 once it is clear that he will not require any further invasive evaluation.  9.  Moderate Aortic Stenosis:  Noted on recent echo.  Signed, Murray Hodgkins, NP 11/12/2020, 8:15 PM  For questions or updates, please contact   Please consult [email or web address] for contact info under  Cardiology/STEMI.   Patient seen and examined with Ignacia Bayley NP.  Agree as above, with the following exceptions and changes as noted below. Mr. Rodier presents with new afib with RVR, and elevated troponin with chest pain at home, and elevated BNP with preserved EF, and signs of HF on chest xray and physical exam. He is short of breath lying flat in bed, and experienced diaphoresis on presentation.  Gen: NAD, CV: irregular rhythm, tachycardic, 1-2/6 SEM, Lungs: diminished right lung base, Abd: soft, Extrem: Warm, well perfused, mild bilateral edema, Neuro/Psych: alert and oriented x 3, normal mood and affect. All available labs, radiology testing, previous records reviewed. With elevated troponin and prior nonobstructive CAD in 2015, concern for progression of CAD. Will [facility] for [facility] tomorrow.   Rate control for afib with diltiazem IV and will start AC with IV heparin for elevated troponin and AC in Afib.   Discussed in detail with patient and wife at bedside.   INFORMED CONSENT: I have reviewed the risks, indications, and alternatives to cardiac catheterization, possible angioplasty, and stenting with the patient. Risks include but are not limited to bleeding, infection, vascular injury, stroke, myocardial infection, arrhythmia, kidney injury, radiation-related injury in the case of prolonged fluoroscopy use, emergency cardiac surgery, and death. The patient understands the risks of serious complication is 1-2 in 6004 with diagnostic cardiac cath and 1-2% or less with angioplasty/stenting.    Elouise Munroe, MD 11/15/2020

## 2020-11-29 NOTE — Consult Note (Addendum)
Cardiology Consult    Patient ID: Bobby NANCARROW MRN: [id number], DOB/AGE: 1933-02-28   Admit date: 11/11/2020 Date of Consult: 12/09/2020  Primary Physician: Caren Macadam, MD Primary Cardiologist: Kirk Ruths, MD Requesting Provider: Fulton Reek, MD  Patient Profile    [name] is a 86 y.o. male with a history of nonobs CAD, HTN, HL, PVCs, diast dysfxn, lacunar infarcts, DMII, GERD, anemia, and remote DVT (approximately 50 years ago), who is being seen today for the evaluation of rapid afib and demand ischemia at the request of Dr. Dina Rich.  Past Medical History   Past Medical History:  Diagnosis Date  . Anemia   . Atherosclerotic PVD with intermittent claudication   . Chronic diastolic congestive heart failure 11/23/2014  . DDD (degenerative disc disease), cervical 08/02/2017  . Diastolic dysfunction    a. 10/2020 Echo: EF 55-60%, no rwma, Gr2 DD, RVSP ~ 33.61mHg. Mod dil LA, mildly dil RA. Mild MR. Mod AS.  .Marland KitchenDiverticulosis   . DVT (deep venous thrombosis)   . Edema 08/19/2014  . GERD (gastroesophageal reflux disease)    hx hiatal hernia  . History of pneumonia 2002  . History of skin cancer 07/30/2019   Follows with [facility] surgery center  . Hyperlipidemia associated with type 2 diabetes mellitus 05/03/2017  . Hypertension associated with type 2 diabetes mellitus 08/20/2007  . IBS (irritable bowel syndrome)   . Mild neurocognitive disorder 07/07/2020  . Moderate aortic stenosis    a. 10/2020 Echo: Mod AS.  . Multiple lacunar infarcts    Two in the right basal ganglia occurring between 2016 and 2021  . Non-obstructive CAD (coronary artery disease)    a. 08/2014 Cath: LM 30, LAD 40-50, LCX nl, RCA nl. PCWP 4, PA 19/4; b. 01/2018 MV: EF 55-65%, no ischemia/infarct.  .Marland KitchenOAB (overactive bladder) 03/26/2015   History of stress incontinence  . Osteoarthritis   . Peripheral neuropathy associated with type 2 diabetes mellitus 05/30/2010  . PVC's (premature  ventricular contractions)    a. 09/2020 Zio: sinus brady/RSR, PACs, brief PAT, PVCs, 4 beats NSVT.  .Marland KitchenShingles   . Type 2 diabetes mellitus with long-term current use of insulin 05/03/2017  . Varicose veins of lower extremities with other complications 061/44/3154   Past Surgical History:  Procedure Laterality Date  . BACK SURGERY    . CATARACT EXTRACTION Bilateral 08/10/2015,08/15/2015  . COLONOSCOPY     Hx: of  . CTS bilateral    . KNEE ARTHROSCOPY  1988, 1992   bilateral  . LEFT AND RIGHT HEART CATHETERIZATION WITH CORONARY ANGIOGRAM N/A 09/03/2014   Procedure: LEFT AND RIGHT HEART CATHETERIZATION WITH CORONARY ANGIOGRAM;  Surgeon: MBlane Ohara MD;  [facility];  Service: Cardiovascular;  Laterality: N/A;  . LUMBAR LAMINECTOMY/DECOMPRESSION MICRODISCECTOMY N/A 08/18/2013   Procedure: Lumbar two-three, lumbar three-four, lumbar four-five decompressive laminectomies;  Surgeon: JOphelia Charter MD;  [facility];  Service: Neurosurgery;  Laterality: N/A;  . TONSILLECTOMY    . TOTAL KNEE ARTHROPLASTY Bilateral 2005, 2007     Allergies  Allergies  Allergen Reactions  . Ciprofloxacin Other (See Comments)    "made me have a weird feeling- felt disoriented.."  . Sulfamethoxazole Rash    History of Present Illness    86year old male with above past medical history including nonobstructive CAD, hypertension, hyperlipidemia, PVCs, diastolic dysfunction, lacunar infarct, type 2 diabetes mellitus, GERD, anemia, and remote DVT.  Previous cardiac evaluation includes diagnostic cardiac catheterization September 2015  revealing moderate nonobstructive left main and LAD disease.  More recently in February 2019, stress testing showed no ischemia or infarct.  In the setting of a history of PVCs and palpitations, he underwent Zio monitoring October this year which showed sinus bradycardia/sinus rhythm, PACs, brief PAT, PVCs, and 4 beats of nonsustained VT.  Palpitations have been  managed with oral beta-blocker therapy.  At recent clinic visit in October, he was noted to have a systolic murmur and echocardiogram was carried out in November revealing an EF of 78-29%, grade 2 diastolic dysfunction, mild MR, and moderate aortic stenosis.  [name] was in his usual state of health until the evening of Friday, December 17, when he had sudden onset of lightheadedness and dyspnea on exertion noted when walking to his bathroom.  After walking only about 20 to 25 feet, he felt as though his legs were becoming very weak and that he might lose consciousness.  He also noted mild chest discomfort while walking.  Once he arrived in the bathroom, he sat on a stool and symptoms resolved within a few minutes.  Since then however, every time he has gotten up to do anything he has had recurrence of presyncope, weakness, dyspnea, and mild chest tightness.  Symptoms always abate within a few minutes with rest and he has not had any resting chest pain or dyspnea.  He checked his blood pressure this morning and noted it to be relatively normal but also noted his heart rate to be elevated greater than 100.  Due to ongoing symptoms, he presented to urgent care where he was found to be in A. fib with RVR.  He was transferred via EMS to the ED.  On arrival here, his rate was 140 bpm.  Rates currently trending in the 1 teens to 120s after receiving 5 mg of IV metoprolol and he is currently asymptomatic.  Labs are notable for a high-sensitivity troponin of 809  795, BNP of 975.6, and magnesium of 1.6.  Magnesium is currently being supplemented.  Chest x-ray is consistent with mild edema.  Covid testing is negative.  Home Medications    . diltiazem  10 mg Intravenous Once    Prior to Admission medications   Medication Sig Start Date End Date Taking? Authorizing Provider  acetaminophen (TYLENOL) 650 MG CR tablet Take 1,300 mg by mouth 3 (three) times daily.   Yes [provider]  amLODipine (NORVASC)  5 MG tablet TAKE ONE TABLET BY MOUTH DAILY Patient taking differently: Take 5 mg by mouth daily. 09/06/20  Yes Koberlein, Steele Berg, MD  aspirin 81 MG tablet Take 81 mg by mouth at bedtime.   Yes [provider]  donepezil (ARICEPT) 5 MG tablet Take 1/2 tablet daily for 2 weeks, then increase to 1 tablet daily Patient taking differently: Take 5 mg by mouth in the morning. 06/24/20  Yes Cameron Sprang, MD  ferrous sulfate 325 (65 FE) MG tablet Take 325 mg by mouth every other day.    Yes [provider]  furosemide (LASIX) 40 MG tablet TAKE ONE TABLET BY MOUTH DAILY Patient taking differently: Take 40 mg by mouth in the morning. 11/11/20  Yes Koberlein, Steele Berg, MD  HUMALOG KWIKPEN 100 UNIT/ML KwikPen INJECT 6 TO 10 UNITS INTO THE SKIN BEFORE EACH MEAL THREE TIMES A DAY Patient taking differently: Inject 6-10 Units into the skin 3 (three) times daily before meals. 04/16/20  Yes Philemon Kingdom, MD  insulin detemir (LEVEMIR FLEXTOUCH) 100 UNIT/ML FlexPen  INJECT 20 UNITS INTO THE SKIN DAILY AT 10PM Patient taking differently: Inject 14-16 Units into the skin See admin instructions. Inject 14-16 units into the skin at 10 PM 09/06/20  Yes Philemon Kingdom, MD  Magnesium 200 MG TABS Take 1 tablet (200 mg total) by mouth daily. 12/17/17  Yes Lendon Colonel, NP  metFORMIN (GLUCOPHAGE) 1000 MG tablet TAKE ONE TABLET BY MOUTH TWICE A DAY WITH MEALS Patient taking differently: Take 1,000 mg by mouth 2 (two) times daily with a meal. 11/11/20  Yes Philemon Kingdom, MD  metoprolol succinate (TOPROL XL) 25 MG 24 hr tablet Take 0.5 tablets (12.5 mg total) by mouth daily. Patient taking differently: Take 12.5 mg by mouth at bedtime. 09/30/20  Yes Lelon Perla, MD  Multiple Vitamins-Minerals (CENTRUM SILVER PO) Take 1 tablet by mouth daily.    Yes [provider]  NON FORMULARY Place 1-2 sprays into both nostrils See admin instructions. [employer] Simply Saline Nasal Relief-  Instill 1-2 sprays into each nostril one to two times a day   Yes [provider]  omeprazole (PRILOSEC) 20 MG capsule TAKE ONE CAPSULE BY MOUTH DAILY Patient taking differently: Take 20 mg by mouth daily before breakfast. 08/30/20  Yes Koberlein, Junell C, MD  pravastatin (PRAVACHOL) 40 MG tablet TAKE ONE TABLET BY MOUTH EVERY EVENING Patient taking differently: Take 40 mg by mouth every evening. 12/26/19  Yes Lelon Perla, MD  Probiotic Product (PROBIOTIC PO) Take 1 capsule by mouth 3 (three) times a week.   Yes [provider]  Propylene Glycol 0.6 % SOLN Place 2 drops into both eyes See admin instructions. Instill 2 drops into both eyes one to two times a day   Yes [provider]  sennosides-docusate sodium (SENOKOT-S) 8.6-50 MG tablet Take 1 tablet by mouth in the morning and at bedtime.   Yes [provider]  vitamin B-12 (CYANOCOBALAMIN) 1000 MCG tablet Take 1,000 mcg by mouth every other day.   Yes [provider]  Accu-Chek Softclix Lancets lancets Use to check blood sugar 5 times a day. 03/30/20   Philemon Kingdom, MD  B-D UF III MINI PEN NEEDLES 31G X 5 MM MISC USE TO TEST BLOOD SUGAR FOUR TIMES A DAY Patient taking differently: 4 (four) times daily. 12/01/19   Philemon Kingdom, MD  Blood Glucose Monitoring Suppl (ACCU-CHEK GUIDE) w/Device KIT 1 kit by Does not apply route See admin instructions. Check blood sugar 3 times a day 01/26/20   Philemon Kingdom, MD  glucose blood (ACCU-CHEK GUIDE) test strip Use to check blood sugar 3 times a day 03/19/20   Philemon Kingdom, MD     Family History    Family History  Problem Relation Age of Onset  . Other Mother 85       CABG  . Heart disease Mother   . Hypertension Mother   . Dementia Father        Cognitive and behavioral changes following anesthesia  . Hyperlipidemia Son    He indicated that his mother is deceased. He indicated that his father is deceased. He indicated that his  maternal grandmother is deceased. He indicated that his maternal grandfather is deceased. He indicated that his paternal grandmother is deceased. He indicated that his paternal grandfather is deceased. He indicated that the status of his son is unknown.   Social History    Social History   Socioeconomic History  . Marital status: Married    Spouse name: Not on  file  . Number of children: 2  . Years of education: 103  . Highest education level: High school graduate  Occupational History  . Occupation: Retired  Tobacco Use  . Smoking status: Never Smoker  . Smokeless tobacco: Never Used  Vaping Use  . Vaping Use: Never used  Substance and Sexual Activity  . Alcohol use: No    Alcohol/week: 0.0 standard drinks  . Drug use: No  . Sexual activity: Not on file  Other Topics Concern  . 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 Difficulty of Paying Living Expenses: Not hard at all  Food Insecurity: No Food Insecurity  . Worried About Charity fundraiser in the Last Year: Never true  . Ran Out of Food in the Last Year: Never true  Transportation Needs: No Transportation Needs  . Lack of Transportation (Medical): No  . Lack of Transportation (Non-Medical): No  Physical Activity: Inactive  . Days of Exercise per Week: 0 days  . Minutes of Exercise per Session: 0 min  Stress: No Stress Concern Present  . Feeling of Stress : Not at all  Social Connections: Moderately Integrated  . Frequency of Communication with Friends and Family: More than three times a week  . Frequency of Social Gatherings with  Friends and Family: Once a week  . Attends Religious Services: More than 4 times per year  . Active Member of Clubs or Organizations: No  . Attends Archivist Meetings: Never  . Marital Status: Married  Human resources officer Violence: Not At Risk  . Fear of Current or Ex-Partner: No  . Emotionally Abused: No  . Physically Abused: No  . Sexually Abused: No     Review of Systems    General:  No chills, fever, night sweats or weight changes.  Cardiovascular:  +++ chest pain, +++ dyspnea on exertion, +++ mild chronic LE edema, no orthopnea, palpitations, paroxysmal nocturnal dyspnea. Dermatological: No rash, lesions/masses Respiratory: +++ cough Friday night but has since resolved, +++ dyspnea Urologic: No hematuria, dysuria Abdominal:   Poor appetite since Friday 12/17.  No nausea, vomiting, diarrhea, bright red blood per rectum, melena, or hematemesis Neurologic:  No visual changes, wkns, changes in mental status. All other systems reviewed and are otherwise negative except as noted above.  Physical Exam    Blood pressure 126/86, pulse (!) 57, resp. rate (!) 24, height 5' 10"  (1.778 m), weight 82.6 kg, SpO2 92 %.  General: Pleasant, NAD Psych: Normal affect. Neuro: Alert and oriented X 3. Moves all extremities spontaneously. HEENT: Normal  Neck: Supple without bruits.  Mildly elevated JVP. Lungs:  Resp regular and unlabored, Scatt exp wheezing.  Bibasilar crackles. Heart: IR, IR, no s3, s4.  2/6 SEM loudest @ upper sternal borders. Abdomen: Soft, non-tender, non-distended, BS + x 4.  Extremities: No clubbing, cyanosis or edema. DP/PT2+, Radials 2+ and equal bilaterally.  Labs    Cardiac Enzymes Recent Labs  Lab 12/10/2020 1620 11/26/2020 1810  TROPONINIHS 809*  795*      Lab Results  Component Value Date   WBC 8.1 12/08/2020   HGB 11.0 (L) 11/13/2020   HCT 36.9 (L) 11/12/2020   MCV 100.5 (H) 11/14/2020   PLT 207 11/23/2020    Recent Labs  Lab 12/07/2020 1620  NA  138  K 4.5  CL 104  CO2 24  BUN 29*  CREATININE 1.10  CALCIUM 9.4  PROT 6.4*  BILITOT 1.0  ALKPHOS 34*  ALT 22  AST 25  GLUCOSE 231*   Lab Results  Component Value Date   CHOL 120 09/14/2020   HDL 56 09/14/2020   LDLCALC 49 09/14/2020   TRIG 72 09/14/2020    Radiology Studies    DG Chest 1 View  Result Date: 12/02/2020 CLINICAL DATA:  cp EXAM: CHEST  1 VIEW COMPARISON:  08/26/2015 and prior. FINDINGS: No pneumothorax. Small bilateral pleural effusions and patchy basilar opacities. Cardiomegaly and central pulmonary vascular congestion. Multilevel spondylosis. IMPRESSION: Cardiomegaly and central pulmonary vascular congestion. Bibasilar opacities and small pleural effusions, edema versus infection. Electronically Signed   By: Primitivo Gauze M.D.   On: 11/12/2020 16:46   ECG & Cardiac Imaging    Afib, 140, leftward axis, ? LAFB, PVCs, ant infarct, minimal lat ST depression - personally reviewed.  Assessment & Plan    1.  Afib RVR: Patient presented to the emergency department with a 3-day history of progressive exertional presyncope/orthostasis, dyspnea, and chest tightness.  He initially presented to an urgent care and was found to be in atrial fibrillation with rapid ventricular response.  He denies palpitations and is unsure as to how long his heart rates have been elevated (140 on arrival).  He received 5 mg of IV metoprolol in the ED and rates are currently trending in the 1 teens to 120s.  He is not symptomatic at rest.  No prior h/o AFib (recent Zio monitor did not show any A. fib).  CHA2DS2-VASc equals 8.  We will add intravenous heparin for the time being given elevated troponins and need for ischemic evaluation (possibly cath).  I will also add IV diltiazem for the time being.  Continue home dose of metoprolol.  If he cannot be adequately rate controlled and ends up requiring cardioversion, as we do not know the duration of his atrial fibrillation, TEE would be  required.  Plan eventual Eliquis.  Discussed with patient and wife.  No prior history of GI bleeding.  2.  Unstable angina/demand Ischemia/nonobstructive CAD: Patient with a history of nonobstructive CAD by catheterization 2015 with low risk Myoview in 2019.  He has been having intermittent exertional chest pain, dyspnea, and presyncope throughout the weekend and in the setting of rapid atrial fibrillation today, was found to have an initial HsTrop elevated at 809 with follow-up of 795.  Follow troponin trend with low threshold to pursue diagnostic catheterization.  Will heparinize.  Continue home doses of aspirin, beta-blocker, and statin therapy.  3.  Acute on chronic diastolic congestive heart failure: Patient with recent echo showing normal LV function and grade 2 diastolic dysfunction.  He has been having significant dyspnea on exertion over the weekend and does appear mildly volume overloaded today.  He does have crackles on examination and chest x-ray shows bibasilar opacities.  Suspect rapid A. fib driving diastolic dysfunction and volume overload.  Will order Lasix 40 IV twice daily to start now.  Can hopefully convert to oral tomorrow, especially if adequate rate control or rhythm control achieved.  Heart  rate stable.  4.  HTN:  Stable.  5.  HL:  LDL 49 in Oct.  Cont statin rx.  6.  Type 2 diabetes mellitus: Patient notes swings between hypoglycemia and hyperglycemia at home.  Management per internal medicine.  7.  Hypomagnesemia:  Supplementation provided.  F/u Mg in AM.  8.  Macrocytic anemia: stable.  No prior history of bleeding.  We discussed plan to initiate oral anticoagulation in the setting of #1 once it is clear that he will not require any further invasive evaluation.  9.  Moderate Aortic Stenosis:  Noted on recent echo.  Signed, Murray Hodgkins, NP 11/20/2020, 8:15 PM  For questions or updates, please contact   Please consult [email or web address] for contact info under  Cardiology/STEMI.   Patient seen and examined with Ignacia Bayley NP.  Agree as above, with the following exceptions and changes as noted below. Mr. Rodier presents with new afib with RVR, and elevated troponin with chest pain at home, and elevated BNP with preserved EF, and signs of HF on chest xray and physical exam. He is short of breath lying flat in bed, and experienced diaphoresis on presentation.  Gen: NAD, CV: irregular rhythm, tachycardic, 1-2/6 SEM, Lungs: diminished right lung base, Abd: soft, Extrem: Warm, well perfused, mild bilateral edema, Neuro/Psych: alert and oriented x 3, normal mood and affect. All available labs, radiology testing, previous records reviewed. With elevated troponin and prior nonobstructive CAD in 2015, concern for progression of CAD. Will plan for [facility] tomorrow.   Rate control for afib with diltiazem IV and will start AC with IV heparin for elevated troponin and AC in Afib.   Discussed in detail with patient and wife at bedside.   INFORMED CONSENT: I have reviewed the risks, indications, and alternatives to cardiac catheterization, possible angioplasty, and stenting with the patient. Risks include but are not limited to bleeding, infection, vascular injury, stroke, myocardial infection, arrhythmia, kidney injury, radiation-related injury in the case of prolonged fluoroscopy use, emergency cardiac surgery, and death. The patient understands the risks of serious complication is 1-2 in 6004 with diagnostic cardiac cath and 1-2% or less with angioplasty/stenting.    Elouise Munroe, MD 12/03/2020

## 2020-11-29 NOTE — Progress Notes (Signed)
ANTICOAGULATION CONSULT NOTE - Initial Consult  Pharmacy Consult for heparin Indication: atrial fibrillation  Allergies  Allergen Reactions  . Ciprofloxacin Other (See Comments)    "made me have a weird feeling- felt disoriented.."  . Sulfamethoxazole Rash    Patient Measurements: Height: 5\' 10"  (177.8 cm) Weight: 82.6 kg (182 lb 1.6 oz) IBW/kg (Calculated) : 73 Heparin Dosing Weight: 82.6 kg   Vital Signs: Temp: 98.6 F (37 C) (12/20 1437) Temp Source: Temporal (12/20 1437) BP: 126/86 (12/20 1900) Pulse Rate: 57 (12/20 1900)  Labs: Recent Labs    11/19/2020 1620 11/30/2020 1810  HGB 11.0*  --   HCT 36.9*  --   PLT 207  --   CREATININE 1.10  --   TROPONINIHS 809* 795*    Estimated Creatinine Clearance: 50.7 mL/min (by C-G formula based on SCr of 1.1 mg/dL).   Medical History: Past Medical History:  Diagnosis Date  . Anemia   . Atherosclerotic PVD with intermittent claudication   . Chronic diastolic congestive heart failure 11/23/2014  . DDD (degenerative disc disease), cervical 08/02/2017  . Diastolic dysfunction    a. 10/2020 Echo: EF 55-60%, no rwma, Gr2 DD, RVSP ~ 33.73mmHg. Mod dil LA, mildly dil RA. Mild MR. Mod AS.  Marland Kitchen Diverticulosis   . DVT (deep venous thrombosis)   . Edema 08/19/2014  . GERD (gastroesophageal reflux disease)    hx hiatal hernia  . History of pneumonia 2002  . History of skin cancer 07/30/2019   Follows with skin surgery center  . Hyperlipidemia associated with type 2 diabetes mellitus 05/03/2017  . Hypertension associated with type 2 diabetes mellitus 08/20/2007  . IBS (irritable bowel syndrome)   . Mild neurocognitive disorder 07/07/2020  . Moderate aortic stenosis    a. 10/2020 Echo: Mod AS.  . Multiple lacunar infarcts    Two in the right basal ganglia occurring between 2016 and 2021  . Non-obstructive CAD (coronary artery disease)    a. 08/2014 Cath: LM 30, LAD 40-50, LCX nl, RCA nl. PCWP 4, PA 19/4; b. 01/2018 MV: EF 55-65%, no  ischemia/infarct.  Marland Kitchen OAB (overactive bladder) 03/26/2015   History of stress incontinence  . Osteoarthritis   . Peripheral neuropathy associated with type 2 diabetes mellitus 05/30/2010  . PVC's (premature ventricular contractions)    a. 09/2020 Zio: sinus brady/RSR, PACs, brief PAT, PVCs, 4 beats NSVT.  Marland Kitchen Shingles   . Type 2 diabetes mellitus with long-term current use of insulin 05/03/2017  . Varicose veins of lower extremities with other complications 01/77/9390    Medications:  (Not in a hospital admission)   Assessment: 86 YOM with new onset Afib with RVR. Pharmacy consulted to start IV heparin. Of note, he is not on any anticoagulation prior to admit.   H/H mildly low, Plt wnl. SCr wnl.   Goal of Therapy:  Heparin level 0.3-0.7 units/ml Monitor platelets by anticoagulation protocol: Yes   Plan:  -Heparin 4000 units IV bolus followed by heparin infusion at 1200 units/hr -F/u 8 hr HL -Monitor daily HL, CBC and s/s of bleeding -F/u plans for transition to oral anticoag   Albertina Parr, PharmD., BCPS, BCCCP Clinical Pharmacist Please refer to [facility] for unit-specific pharmacist

## 2020-11-29 NOTE — Telephone Encounter (Signed)
Pt is calling in stating that he is have some exhausted when he stands to walk but then he sits down and he feels fine and began to sweat and upon sitting he is fine.  Pt was transferred to Triage to evaluate.

## 2020-11-29 NOTE — Telephone Encounter (Signed)
Looks like he is in ED. Will follow.

## 2020-11-29 NOTE — ED Triage Notes (Signed)
Pt presents with chest pain, and sweat when trying to get up. Pt states he starts to feel weak when standing and feels like he is going to pass out. Pt states his blood count has been off lately.

## 2020-11-29 NOTE — ED Notes (Signed)
Patient has had symptoms for 3-4 days .  Patient has intermittent chest pain, diaphoresis and feeling as if he is going to pass out when he stands or exerts himself.  Currently sitting in wheelchair, skin warm and dry, breathing easy, respirations regular and unlabored.

## 2020-11-29 NOTE — ED Provider Notes (Addendum)
[name]    CSN: [id number] Arrival date & time: 11/14/2020  1425      History   Chief Complaint Chief Complaint  Patient presents with  . Chest Pain  . Fatigue    HPI [name] is a 86 y.o. male.   Patient is a 86 year old male presents today with complaints of chest pain, shortness of breath with exertion.  This has been present over the past 2 days.  He has had episodes of feeling he is in a pass out.  Chest pain is resolved with rest.  Overall just weak.  Past medical history of anemia, CAD, heart failure, diverticulosis, degenerative disc disease, DVT, GERD, pneumonia, hyperlipidemia, hypertension, lacunar infarcts, neuropathy, diabetes.  Per wife his blood sugars have been "all over the place".  He has not been eating much and taking his insulin.     Past Medical History:  Diagnosis Date  . Anemia   . Atherosclerotic PVD with intermittent claudication   . CAD (coronary artery disease)   . Chronic diastolic congestive heart failure 11/23/2014  . DDD (degenerative disc disease), cervical 08/02/2017  . Diverticulosis   . DVT (deep venous thrombosis)   . Edema 08/19/2014  . GERD (gastroesophageal reflux disease)    hx hiatal hernia  . History of pneumonia 2002  . History of skin cancer 07/30/2019   Follows with skin surgery center  . Hyperlipidemia associated with type 2 diabetes mellitus 05/03/2017  . Hypertension associated with type 2 diabetes mellitus 08/20/2007  . IBS (irritable bowel syndrome)   . Mild neurocognitive disorder 07/07/2020  . Multiple lacunar infarcts    Two in the right basal ganglia occurring between 2016 and 2021  . OAB (overactive bladder) 03/26/2015   History of stress incontinence  . Osteoarthritis   . Peripheral neuropathy associated with type 2 diabetes mellitus 05/30/2010  . Shingles   . Type 2 diabetes mellitus with long-term current use of insulin 05/03/2017  . Varicose veins of lower extremities with other  complications 53/66/4403    Patient Active Problem List   Diagnosis Date Noted  . Mild neurocognitive disorder 07/07/2020  . Multiple lacunar infarcts   . History of skin cancer 07/30/2019  . DDD (degenerative disc disease), cervical 08/02/2017  . Type 2 diabetes mellitus with long-term current use of insulin 05/03/2017  . Hyperlipidemia associated with type 2 diabetes mellitus 05/03/2017  . OAB (overactive bladder) 03/26/2015  . Chronic diastolic congestive heart failure 11/23/2014  . CAD (coronary artery disease) 10/14/2014  . Peripheral neuropathy associated with type 2 diabetes mellitus 05/30/2010  . Hypertension associated with diabetes 08/20/2007  . GERD (gastroesophageal reflux disease) 06/13/2007    Past Surgical History:  Procedure Laterality Date  . BACK SURGERY    . CATARACT EXTRACTION Bilateral 08/10/2015,08/15/2015  . COLONOSCOPY     Hx: of  . CTS bilateral    . KNEE ARTHROSCOPY  1988, 1992   bilateral  . LEFT AND RIGHT HEART CATHETERIZATION WITH CORONARY ANGIOGRAM N/A 09/03/2014   Procedure: LEFT AND RIGHT HEART CATHETERIZATION WITH CORONARY ANGIOGRAM;  Surgeon: [name], MD;  Location: [facility] CATH LAB;  Service: Cardiovascular;  Laterality: N/A;  . LUMBAR LAMINECTOMY/DECOMPRESSION MICRODISCECTOMY N/A 08/18/2013   Procedure: Lumbar two-three, lumbar three-four, lumbar four-five decompressive laminectomies;  Surgeon: [name], MD;  Location: [facility];  Service: Neurosurgery;  Laterality: N/A;  . TONSILLECTOMY    . TOTAL KNEE ARTHROPLASTY Bilateral 2005, 2007       Home Medications  Prior to Admission medications   Medication Sig Start Date End Date Taking? Authorizing Provider  Accu-Chek Softclix Lancets lancets Use to check blood sugar 5 times a day. 03/30/20   Philemon Kingdom, MD  Acetaminophen (TYLENOL ARTHRITIS PAIN PO) Take 625 mg by mouth 3 (three) times daily.     [provider]  amLODipine (NORVASC) 5 MG tablet TAKE ONE TABLET  BY MOUTH DAILY Patient taking differently: Take 2.5 mg by mouth daily.  09/06/20   Caren Macadam, MD  aspirin 81 MG tablet Take 81 mg by mouth daily.     [provider]  B-D UF III MINI PEN NEEDLES 31G X 5 MM MISC USE TO TEST BLOOD SUGAR FOUR TIMES A DAY 12/01/19   Philemon Kingdom, MD  Blood Glucose Monitoring Suppl (ACCU-CHEK GUIDE) w/Device KIT 1 kit by Does not apply route See admin instructions. Check blood sugar 3 times a day 01/26/20   Philemon Kingdom, MD  donepezil (ARICEPT) 5 MG tablet Take 1/2 tablet daily for 2 weeks, then increase to 1 tablet daily 06/24/20   Cameron Sprang, MD  ferrous sulfate 325 (65 FE) MG tablet Take 325 mg by mouth every other day.     [provider]  furosemide (LASIX) 40 MG tablet TAKE ONE TABLET BY MOUTH DAILY 11/11/20   Micheline Rough C, MD  glucose blood (ACCU-CHEK GUIDE) test strip Use to check blood sugar 3 times a day 03/19/20   Philemon Kingdom, MD  HUMALOG KWIKPEN 100 UNIT/ML KwikPen INJECT 6 TO 10 UNITS INTO THE SKIN BEFORE [facility] MEAL THREE TIMES A DAY 04/16/20   Philemon Kingdom, MD  insulin detemir (LEVEMIR FLEXTOUCH) 100 UNIT/ML FlexPen INJECT 20 UNITS INTO THE SKIN DAILY AT 10PM 09/06/20   Philemon Kingdom, MD  Magnesium 200 MG TABS Take 1 tablet (200 mg total) by mouth daily. 12/17/17   Lendon Colonel, NP  metFORMIN (GLUCOPHAGE) 1000 MG tablet TAKE ONE TABLET BY MOUTH TWICE A DAY WITH MEALS 11/11/20   Philemon Kingdom, MD  metoprolol succinate (TOPROL XL) 25 MG 24 hr tablet Take 0.5 tablets (12.5 mg total) by mouth daily. 09/30/20   Lelon Perla, MD  Multiple Vitamins-Minerals (CENTRUM SILVER PO) Take 1 tablet by mouth daily.     [provider]  omeprazole (PRILOSEC) 20 MG capsule TAKE ONE CAPSULE BY MOUTH DAILY 08/30/20   Koberlein, Steele Berg, MD  OVER THE COUNTER MEDICATION     [provider]  pravastatin (PRAVACHOL) 40 MG tablet TAKE ONE TABLET BY MOUTH EVERY EVENING 12/26/19   Lelon Perla,  MD  Probiotic Product (PROBIOTIC PO) Take by mouth.    [provider]  Propylene Glycol (SYSTANE BALANCE) 0.6 % SOLN Apply to eye. 2 drops each eye once daily    [provider]  vitamin B-12 (CYANOCOBALAMIN) 1000 MCG tablet Take 1,000 mcg by mouth daily.    [provider]    Family History Family History  Problem Relation Age of Onset  . Other Mother 57       CABG  . Heart disease Mother   . Hypertension Mother   . Dementia Father        Cognitive and behavioral changes following anesthesia  . Hyperlipidemia Son     Social History Social History   Tobacco Use  . Smoking status: Never Smoker  . Smokeless tobacco: Never Used  Vaping Use  . Vaping Use: Never used  Substance Use Topics  . Alcohol use: No  Alcohol/week: 0.0 standard drinks  . Drug use: No     Allergies   Ciprofloxacin and Sulfamethoxazole   Review of Systems Review of Systems   Physical Exam Triage Vital Signs ED Triage Vitals  Enc Vitals Group     BP 12/04/2020 1433 118/82     Pulse Rate 11/17/2020 1433 95     Resp 11/20/2020 1433 17     Temp 11/17/2020 1437 98.6 F (37 C)     Temp Source 11/18/2020 1437 Temporal     SpO2 11/24/2020 1433 99 %     Weight --      Height --      Head Circumference --      Peak Flow --      Pain Score 11/25/2020 1435 0     Pain Loc --      Pain Edu? --      Excl. in [place]? --    No data found.  Updated Vital Signs BP 118/82 (BP Location: Right Arm)   Pulse 95   Temp 98.6 F (37 C) (Temporal)   Resp 17   SpO2 99%   Visual Acuity Right Eye Distance:   Left Eye Distance:   Bilateral Distance:    Right Eye Near:   Left Eye Near:    Bilateral Near:     Physical Exam Vitals and nursing note reviewed.  Constitutional:      General: He is not in acute distress.    Appearance: Normal appearance. He is not ill-appearing, toxic-appearing or diaphoretic.  HENT:     Head: Normocephalic and atraumatic.     Nose: Nose normal.  Eyes:      Conjunctiva/sclera: Conjunctivae normal.  Cardiovascular:     Rate and Rhythm: Rhythm irregular.  Pulmonary:     Effort: Pulmonary effort is normal.     Breath sounds: Normal breath sounds.  Musculoskeletal:        General: Normal range of motion.     Cervical back: Normal range of motion.  Skin:    General: Skin is warm and dry.  Neurological:     Mental Status: He is alert.  Psychiatric:        Mood and Affect: Mood normal.      UC Treatments / Results  Labs (all labs ordered are listed, but only abnormal results are displayed) Labs Reviewed - No data to display  EKG   Radiology No results found.  Procedures Procedures (including critical care time)  Medications Ordered in UC Medications - No data to display  Initial Impression / Assessment and Plan / UC Course  I have reviewed the triage vital signs and the nursing notes.  Pertinent labs & imaging results that were available during my care of the patient were reviewed by me and considered in my medical decision making (see chart for details).     A. fib with RVR, shortness of breath and fatigue EKG concerning here today.  Significantly changed from previous.  A. fib with RVR and ST and T wave abnormality  patient has been having exertional chest pain and shortness of breath. He is currently stable but sending to the ER via EMS for further evaluation Final Clinical Impressions(s) / UC Diagnoses   Final diagnoses:  Atrial fibrillation with RVR (HCC)  SOB (shortness of breath)  Fatigue, unspecified type     Discharge Instructions     Go to the ER     ED Prescriptions    None  PDMP not reviewed this encounter.   Orvan July, NP 12/04/2020 1533    Orvan July, NP 12/03/2020 1535

## 2020-11-29 NOTE — ED Provider Notes (Addendum)
[facility] Provider Note   CSN: [id number] Arrival date & time: 11/28/2020  1601     History Chief Complaint  Patient presents with  . Chest Pain    [name] is a 86 y.o. male.  HPI   86 year old male past medical history of CAD, atrial fibrillation, IBS, HTN, HLD, DM, previous DVT not on anticoagulation presents the emergency department with concern for 4 days of midsternal chest discomfort and exertional shortness of breath.  Patient states over the weekend this was initially intermittent, however today it became more persistent and severe, prompting him to seek evaluation at the urgent care.  At the urgent care they noted that his EKG was A. fib with RVR and referred him here for evaluation.  Fingerstick was reportedly high 200s.  Patient states he has had decreased appetite but otherwise denies any acute illness including fever, vomiting, diarrhea, swelling of his lower extremities.  He states he has been compliant with his medication.  Past Medical History:  Diagnosis Date  . Anemia   . Atherosclerotic PVD with intermittent claudication   . Chronic diastolic congestive heart failure 11/23/2014  . DDD (degenerative disc disease), cervical 08/02/2017  . Diastolic dysfunction    a. 10/2020 Echo: EF 55-60%, no rwma, Gr2 DD, RVSP ~ 33.52mHg. Mod dil LA, mildly dil RA. Mild MR. Mod AS.  .Marland KitchenDiverticulosis   . DVT (deep venous thrombosis)   . Edema 08/19/2014  . GERD (gastroesophageal reflux disease)    hx hiatal hernia  . History of pneumonia 2002  . History of skin cancer 07/30/2019   Follows with skin surgery center  . Hyperlipidemia associated with type 2 diabetes mellitus 05/03/2017  . Hypertension associated with type 2 diabetes mellitus 08/20/2007  . IBS (irritable bowel syndrome)   . Mild neurocognitive disorder 07/07/2020  . Moderate aortic stenosis    a. 10/2020 Echo: Mod AS.  . Multiple lacunar infarcts    Two in the right  basal ganglia occurring between 2016 and 2021  . Non-obstructive CAD (coronary artery disease)    a. 08/2014 Cath: LM 30, LAD 40-50, LCX nl, RCA nl. PCWP 4, PA 19/4; b. 01/2018 MV: EF 55-65%, no ischemia/infarct.  .Marland KitchenOAB (overactive bladder) 03/26/2015   History of stress incontinence  . Osteoarthritis   . Peripheral neuropathy associated with type 2 diabetes mellitus 05/30/2010  . PVC's (premature ventricular contractions)    a. 09/2020 Zio: sinus brady/RSR, PACs, brief PAT, PVCs, 4 beats NSVT.  .Marland KitchenShingles   . Type 2 diabetes mellitus with long-term current use of insulin 05/03/2017  . Varicose veins of lower extremities with other complications [id number]   Patient Active Problem List   Diagnosis Date Noted  . Atrial fibrillation with rapid ventricular response ([place] 12/03/2020  . Mild neurocognitive disorder 07/07/2020  . Multiple lacunar infarcts   . History of skin cancer 07/30/2019  . DDD (degenerative disc disease), cervical 08/02/2017  . Type 2 diabetes mellitus with long-term current use of insulin 05/03/2017  . Hyperlipidemia associated with type 2 diabetes mellitus 05/03/2017  . OAB (overactive bladder) 03/26/2015  . Chronic diastolic congestive heart failure 11/23/2014  . CAD (coronary artery disease) 10/14/2014  . Peripheral neuropathy associated with type 2 diabetes mellitus 05/30/2010  . Hypertension associated with diabetes 08/20/2007  . GERD (gastroesophageal reflux disease) 06/13/2007    Past Surgical History:  Procedure Laterality Date  . BACK SURGERY    . CATARACT EXTRACTION Bilateral 08/10/2015,08/15/2015  . COLONOSCOPY  Hx: of  . CTS bilateral    . KNEE ARTHROSCOPY  1988, 1992   bilateral  . LEFT AND RIGHT HEART CATHETERIZATION WITH CORONARY ANGIOGRAM N/A 09/03/2014   Procedure: LEFT AND RIGHT HEART CATHETERIZATION WITH CORONARY ANGIOGRAM;  Surgeon: [name], MD;  Location: [facility] C[facility] LAB;  Service: Cardiovascular;  Laterality: N/A;  . LUMBAR  LAMINECTOMY/DECOMPRESSION MICRODISCECTOMY N/A 08/18/2013   Procedure: Lumbar two-three, lumbar three-four, lumbar four-five decompressive laminectomies;  Surgeon: [name], MD;  Location: [facility]URO ORS;  Service: Neurosurgery;  Laterality: N/A;  . TONSILLECTOMY    . TOTAL KNEE ARTHROPLASTY Bilateral 2005, 2007       Family History  Problem Relation Age of Onset  . Other Mother 109       CABG  . Heart disease Mother   . Hypertension Mother   . Dementia Father        Cognitive and behavioral changes following anesthesia  . Hyperlipidemia Son     Social History   Tobacco Use  . Smoking status: Never Smoker  . Smokeless tobacco: Never Used  Vaping Use  . Vaping Use: Never used  Substance Use Topics  . Alcohol use: No    Alcohol/week: 0.0 standard drinks  . Drug use: No    Home Medications Prior to Admission medications   Medication Sig Start Date End Date Taking? Authorizing Provider  acetaminophen (TYLENOL) 650 MG CR tablet Take 1,300 mg by mouth 3 (three) times daily.   Yes [provider]  amLODipine (NORVASC) 5 MG tablet TAKE ONE TABLET BY MOUTH DAILY Patient taking differently: Take 5 mg by mouth daily. 09/06/20  Yes Koberlein, Steele Berg, MD  aspirin 81 MG tablet Take 81 mg by mouth at bedtime.   Yes [provider]  donepezil (ARICEPT) 5 MG tablet Take 1/2 tablet daily for 2 weeks, then increase to 1 tablet daily Patient taking differently: Take 5 mg by mouth in the morning. 06/24/20  Yes Cameron Sprang, MD  ferrous sulfate 325 (65 FE) MG tablet Take 325 mg by mouth every other day.    Yes [provider]  furosemide (LASIX) 40 MG tablet TAKE ONE TABLET BY MOUTH DAILY Patient taking differently: Take 40 mg by mouth in the morning. 11/11/20  Yes Koberlein, Steele Berg, MD  HUMALOG KWIKPEN 100 UNIT/ML KwikPen INJECT 6 TO 10 UNITS INTO THE SKIN BEFORE EACH MEAL THREE TIMES A DAY Patient taking differently: Inject 6-10 Units into the skin 3 (three)  times daily before meals. 04/16/20  Yes Philemon Kingdom, MD  insulin detemir (LEVEMIR FLEXTOUCH) 100 UNIT/ML FlexPen INJECT 20 UNITS INTO THE SKIN DAILY AT 10PM Patient taking differently: Inject 14-16 Units into the skin See admin instructions. Inject 14-16 units into the skin at 10 PM 09/06/20  Yes Philemon Kingdom, MD  Magnesium 200 MG TABS Take 1 tablet (200 mg total) by mouth daily. 12/17/17  Yes Lendon Colonel, NP  metFORMIN (GLUCOPHAGE) 1000 MG tablet TAKE ONE TABLET BY MOUTH TWICE A DAY WITH MEALS Patient taking differently: Take 1,000 mg by mouth 2 (two) times daily with a meal. 11/11/20  Yes Philemon Kingdom, MD  metoprolol succinate (TOPROL XL) 25 MG 24 hr tablet Take 0.5 tablets (12.5 mg total) by mouth daily. Patient taking differently: Take 12.5 mg by mouth at bedtime. 09/30/20  Yes Lelon Perla, MD  Multiple Vitamins-Minerals (CENTRUM SILVER PO) Take 1 tablet by mouth daily.    Yes [provider]  NON FORMULARY Place 1-2  sprays into both nostrils See admin instructions. [employer] Simply Saline Nasal Relief- Instill 1-2 sprays into each nostril one to two times a day   Yes [provider]  omeprazole (PRILOSEC) 20 MG capsule TAKE ONE CAPSULE BY MOUTH DAILY Patient taking differently: Take 20 mg by mouth daily before breakfast. 08/30/20  Yes Koberlein, Junell C, MD  pravastatin (PRAVACHOL) 40 MG tablet TAKE ONE TABLET BY MOUTH EVERY EVENING Patient taking differently: Take 40 mg by mouth every evening. 12/26/19  Yes Lelon Perla, MD  Probiotic Product (PROBIOTIC PO) Take 1 capsule by mouth 3 (three) times a week.   Yes [provider]  Propylene Glycol 0.6 % SOLN Place 2 drops into both eyes See admin instructions. Instill 2 drops into both eyes one to two times a day   Yes [provider]  sennosides-docusate sodium (SENOKOT-S) 8.6-50 MG tablet Take 1 tablet by mouth in the morning and at bedtime.   Yes [provider]   vitamin B-12 (CYANOCOBALAMIN) 1000 MCG tablet Take 1,000 mcg by mouth every other day.   Yes [provider]  Accu-Chek Softclix Lancets lancets Use to check blood sugar 5 times a day. 03/30/20   Philemon Kingdom, MD  B-D UF III MINI PEN NEEDLES 31G X 5 MM MISC USE TO TEST BLOOD SUGAR FOUR TIMES A DAY Patient taking differently: 4 (four) times daily. 12/01/19   Philemon Kingdom, MD  Blood Glucose Monitoring Suppl (ACCU-CHEK GUIDE) w/Device KIT 1 kit by Does not apply route See admin instructions. Check blood sugar 3 times a day 01/26/20   Philemon Kingdom, MD  glucose blood (ACCU-CHEK GUIDE) test strip Use to check blood sugar 3 times a day 03/19/20   Philemon Kingdom, MD    Allergies    Ciprofloxacin and Sulfamethoxazole  Review of Systems   Review of Systems  Constitutional: Positive for appetite change and fatigue. Negative for chills and fever.  HENT: Negative for congestion.   Eyes: Negative for visual disturbance.  Respiratory: Positive for chest tightness and shortness of breath.   Cardiovascular: Positive for chest pain and palpitations. Negative for leg swelling.  Gastrointestinal: Negative for abdominal pain, diarrhea and vomiting.  Genitourinary: Negative for dysuria.  Skin: Negative for rash.  Neurological: Negative for headaches.    Physical Exam Updated Vital Signs BP 126/86   Pulse (!) 57   Resp (!) 24   Ht _0  (1.778 m)   Wt 82.6 kg   SpO2 92%   BMI 26.13 kg/m   Physical Exam Vitals and nursing note reviewed.  Constitutional:      Appearance: Normal appearance.  HENT:     Head: Normocephalic.     Mouth/Throat:     Mouth: Mucous membranes are moist.  Cardiovascular:     Rate and Rhythm: Tachycardia present. Rhythm irregular.  Pulmonary:     Effort: Pulmonary effort is normal. No respiratory distress.     Breath sounds: Normal breath sounds.  Chest:     Chest wall: No tenderness or crepitus.  Abdominal:     Palpations: Abdomen is soft.      Tenderness: There is no abdominal tenderness.  Musculoskeletal:     Right lower leg: No tenderness. No edema.     Left lower leg: No tenderness. No edema.  Skin:    General: Skin is warm.  Neurological:     Mental Status: He is alert and oriented to person, place, and time. Mental status is at baseline.  Psychiatric:  Mood and Affect: Mood normal.     ED Results / Procedures / Treatments   Labs (all labs ordered are listed, but only abnormal results are displayed) Labs Reviewed  BASIC METABOLIC PANEL - Abnormal; Notable for the following components:      Result Value   Glucose, Bld 231 (*)    BUN 29 (*)    All other components within normal limits  CBC - Abnormal; Notable for the following components:   RBC 3.67 (*)    Hemoglobin 11.0 (*)    HCT 36.9 (*)    MCV 100.5 (*)    MCHC 29.8 (*)    All other components within normal limits  HEPATIC FUNCTION PANEL - Abnormal; Notable for the following components:   Total Protein 6.4 (*)    Alkaline Phosphatase 34 (*)    All other components within normal limits  MAGNESIUM - Abnormal; Notable for the following components:   Magnesium 1.6 (*)    All other components within normal limits  BRAIN NATRIURETIC PEPTIDE - Abnormal; Notable for the following components:   B Natriuretic Peptide 975.6 (*)    All other components within normal limits  TROPONIN I (HIGH SENSITIVITY) - Abnormal; Notable for the following components:   Troponin I (High Sensitivity) 809 (*)    All other components within normal limits  RESP PANEL BY RT-PCR (FLU A&B, COVID) ARPGX2  PROTIME-INR  CBC WITH DIFFERENTIAL/PLATELET  CBC WITH DIFFERENTIAL/PLATELET  TROPONIN I (HIGH SENSITIVITY)    EKG EKG Interpretation  Date/Time:  Monday November 29 2020 16:05:36 EST Ventricular Rate:  140 PR Interval:    QRS Duration: 98 QT Interval:  322 QTC Calculation: 511 R Axis:   -75 Text Interpretation: Atrial fibrillation with rapid V-rate Ventricular  tachycardia, unsustained Left anterior fascicular block Anterior infarct, old Repolarization abnormality, prob rate related Atrial fibrillation with RVR, PVCs Confirmed by Lavenia Atlas [phone]) on 12/04/2020 4:28:15 PM   Radiology DG Chest 1 View  Result Date: 11/23/2020 CLINICAL DATA:  cp EXAM: CHEST  1 VIEW COMPARISON:  08/26/2015 and prior. FINDINGS: No pneumothorax. Small bilateral pleural effusions and patchy basilar opacities. Cardiomegaly and central pulmonary vascular congestion. Multilevel spondylosis. IMPRESSION: Cardiomegaly and central pulmonary vascular congestion. Bibasilar opacities and small pleural effusions, edema versus infection. Electronically Signed   By: Primitivo Gauze M.D.   On: 11/19/2020 16:46    Procedures .Critical Care Performed by: Lorelle Gibbs, DO Authorized by: Lorelle Gibbs, DO   Critical care provider statement:    Critical care time (minutes):  60   Critical care was necessary to treat or prevent imminent or life-threatening deterioration of the following conditions:  Cardiac failure and circulatory failure   Critical care was time spent personally by me on the following activities:  Discussions with consultants, evaluation of patient's response to treatment, examination of patient, interpretation of cardiac output measurements, obtaining history from patient or surrogate, ordering and review of laboratory studies, ordering and review of radiographic studies, re-evaluation of patient's condition and review of old charts   (including critical care time)  Medications Ordered in ED Medications  magnesium sulfate IVPB 2 g 50 mL (2 g Intravenous New Bag/Given 11/11/2020 1900)  metoprolol tartrate (LOPRESSOR) injection 5 mg (5 mg Intravenous Given 12/03/2020 1711)  insulin aspart (novoLOG) injection 5 Units (5 Units Subcutaneous Given 11/15/2020 1711)  aspirin chewable tablet 324 mg (324 mg Oral Given 12/04/2020 1856)    ED Course  I have reviewed  the triage vital signs and the  nursing notes.  Pertinent labs & imaging results that were available during my care of the patient were reviewed by me and considered in my medical decision making (see chart for details).  Clinical Course as of 12/08/2020 1925  Mon Nov 29, 2020  1922 EKG shows atrial fibrillation with RVR, rates in the 140s, his blood pressure is stable, he has frequent PVCs and nonsustained V. tach of about 4-5 beats.  He has had this before in the past.  Patient was given a dose of Lopressor IV, heart rate has responded nicely, blood pressure tolerated.  PVCs are less frequent.  Blood work shows a baseline anemia.  BNP and troponin are both elevated, chest x-ray shows signs of pulmonary edema, magnesium is 1.6.  Spoke with cardiology, they agree with the IV Lopressor dose, recommend magnesium IV with the nonsustained ventricular tachycardia.  Patient received aspirin, no plans to anticoagulate at this time, repeat troponin is pending.  Patient is chest pain-free, laying in bed and comfortable.  Will admit. [KH]    Clinical Course User Index [KH] Sanford Lindblad, Alvin Critchley, DO   MDM Rules/Calculators/A&P                          86 year old male presents the emergency department for chest pain, shortness of breath, palpitations.  EKG shows atrial fibrillation with RVR, multiple PVCs.  On the monitor he did have a 4 beat run of nonsustained ventricular tachycardia, he was awake and asymptomatic during this.  Blood pressure is stable.  Will evaluate the patient with blood work, chest x-ray and administer medications for rate control.  Patient responded well to IV Lopressor, blood pressure shows elevated troponin, BNP with signs of failure.  Cardiology has been consulted, recommend IV magnesium as well.  Currently patient is pain-free, stable vitals.  Will admit to hospitalist with cardiology consult. Final Clinical Impression(s) / ED Diagnoses Final diagnoses:  None    Rx / DC Orders ED  Discharge Orders    None       Lorelle Gibbs, DO 12/02/2020 1925    Lorelle Gibbs, DO 12/10/2020 1946

## 2020-11-29 NOTE — Telephone Encounter (Signed)
I called and left a message for the patient to call back to give [place] his readings from the last 48 to 72 hours as well as what he's been eating and administering his medication. Awaiting a call back for more information.

## 2020-11-29 NOTE — ED Notes (Signed)
Patient is being discharged from the Urgent Care and sent to the Emergency Department via EMS . Per Urgent Care MD, patient is in need of higher level of care due to A-Fib RVR and Chest Pain . Patient is aware and verbalizes understanding of plan of care.  Vitals:   12/09/2020 1433 12/02/2020 1437  BP: 118/82   Pulse: 95   Resp: 17   Temp:  98.6 F (37 C)  SpO2: 99%

## 2020-11-29 NOTE — H&P (Signed)
History and Physical    [name] AVW:[id number] DOB: 1933/09/09 DOA: 11/21/2020  PCP: Caren Macadam, MD Patient coming from: Urgent care  Chief Complaint: Chest pain, shortness of breath  HPI: [name] is a 86 y.o. male with medical history significant of nonobstructive CAD, chronic diastolic CHF, moderate aortic stenosis, history of PACs/PVCs/NSVT on metoprolol, hypertension, hyperlipidemia, insulin-dependent type 2 diabetes, peripheral neuropathy, PVD, history of DVT currently not on anticoagulation, GERD, history of skin cancer presented to urgent care today with complaints of chest pain, dyspnea, and near syncope.  He was found to be in new onset A. fib with RVR and sent to the ED for further evaluation.  History provided by patient and his wife at bedside.  Patient's wife is concerned that his blood sugars have been fluctuating and that the patient has been feeling hot and sweating a lot.  Patient states for the past 3 days every time he tries to get up to walk he feels like he is going to pass out.  Walking also makes him feel short of breath and he starts having left-sided substernal pressure-like chest pain.  He normally wears compression stockings for swelling in his legs but the swelling has been worse for the past 1 week.  He always uses a recliner and is never able to lay down flat to sleep.  He is coughing a little.  Denies fevers.  Denies palpitations.  He has been vaccinated against Covid including booster shot.  Wife states one time EMS came to his house in October as the patient was not feeling well and they thought he was in A. fib.  States patient was subsequently seen by a cardiologist and told he did not have A. fib.  ED Course: In A. fib with rate in the 140s.  Blood pressure stable.  He had frequent PVCs and NSVT of about 4-5 beats on telemetry.  WBC 8.1, hemoglobin 11.0, hematocrit 36.9, platelet 207K.  Sodium 138, potassium 4.5, chloride 104, bicarb 24, BUN 29,  creatinine 1.1, glucose 231.  High-sensitivity troponin elevated but stable (809 >795).  Magnesium 1.6.  BNP 975.  SARS-CoV-2 PCR test and influenza panel pending.  Chest x-ray showing cardiomegaly and central pulmonary vascular congestion.  Bibasilar opacities and small pleural effusions.  He was given IV Lopressor 5 mg after which heart rate improved and PVCs became less frequent.  ED provider discussed the case with on-call cardiologist who agreed with giving IV Lopressor.  He also recommended giving IV magnesium for NSVT.  Patient's elevated troponin and chest pain were felt to be due to demand ischemia and cardiology did not recommend starting anticoagulation at this time.  He was given full dose aspirin.  Chest pain resolved.  Cardiology will consult.  Patient was also given NovoLog 5 units for hyperglycemia.  Review of Systems:  All systems reviewed and apart from history of presenting illness, are negative.  Past Medical History:  Diagnosis Date  . Anemia   . Atherosclerotic PVD with intermittent claudication   . Chronic diastolic congestive heart failure 11/23/2014  . DDD (degenerative disc disease), cervical 08/02/2017  . Diastolic dysfunction    a. 10/2020 Echo: EF 55-60%, no rwma, Gr2 DD, RVSP ~ 33.12mHg. Mod dil LA, mildly dil RA. Mild MR. Mod AS.  .Marland KitchenDiverticulosis   . DVT (deep venous thrombosis)   . Edema 08/19/2014  . GERD (gastroesophageal reflux disease)    hx hiatal hernia  . History of pneumonia 2002  . History  of skin cancer 07/30/2019   Follows with skin surgery center  . Hyperlipidemia associated with type 2 diabetes mellitus 05/03/2017  . Hypertension associated with type 2 diabetes mellitus 08/20/2007  . IBS (irritable bowel syndrome)   . Mild neurocognitive disorder 07/07/2020  . Moderate aortic stenosis    a. 10/2020 Echo: Mod AS.  . Multiple lacunar infarcts    Two in the right basal ganglia occurring between 2016 and 2021  . Non-obstructive CAD (coronary  artery disease)    a. 08/2014 Cath: LM 30, LAD 40-50, LCX nl, RCA nl. PCWP 4, PA 19/4; b. 01/2018 MV: EF 55-65%, no ischemia/infarct.  Marland Kitchen OAB (overactive bladder) 03/26/2015   History of stress incontinence  . Osteoarthritis   . Peripheral neuropathy associated with type 2 diabetes mellitus 05/30/2010  . PVC's (premature ventricular contractions)    a. 09/2020 Zio: sinus brady/RSR, PACs, brief PAT, PVCs, 4 beats NSVT.  Marland Kitchen Shingles   . Type 2 diabetes mellitus with long-term current use of insulin 05/03/2017  . Varicose veins of lower extremities with other complications 17/51/0258    Past Surgical History:  Procedure Laterality Date  . BACK SURGERY    . CATARACT EXTRACTION Bilateral 08/10/2015,08/15/2015  . COLONOSCOPY     Hx: of  . CTS bilateral    . KNEE ARTHROSCOPY  1988, 1992   bilateral  . LEFT AND RIGHT HEART CATHETERIZATION WITH CORONARY ANGIOGRAM N/A 09/03/2014   Procedure: LEFT AND RIGHT HEART CATHETERIZATION WITH CORONARY ANGIOGRAM;  Surgeon: [name], MD;  Location[facility] CATH LAB;  Service: Cardiovascular;  Laterality: N/A;  . LUMBAR LAMINECTOMY/DECOMPRESSION MICRODISCECTOMY N/A 08/18/2013   Procedure: Lumbar two-three, lumbar three-four, lumbar four-five decompressive laminectomies;  Surgeon: [name], MD;  Location: [place] [facility] NEURO ORS;  Service: Neurosurgery;  Laterality: N/A;  . TONSILLECTOMY    . TOTAL KNEE ARTHROPLASTY Bilateral 2005, 2007     reports that he has never smoked. He has never used smokeless tobacco. He reports that he does not drink alcohol and does not use drugs.  Allergies  Allergen Reactions  . Ciprofloxacin Other (See Comments)    "made me have a weird feeling- felt disoriented.."  . Sulfamethoxazole Rash    Family History  Problem Relation Age of Onset  . Other Mother 93       CABG  . Heart disease Mother   . Hypertension Mother   . Dementia Father        Cognitive and behavioral changes following anesthesia  . Hyperlipidemia Son      Prior to Admission medications   Medication Sig Start Date End Date Taking? Authorizing Provider  acetaminophen (TYLENOL) 650 MG CR tablet Take 1,300 mg by mouth 3 (three) times daily.   Yes [provider]  amLODipine (NORVASC) 5 MG tablet TAKE ONE TABLET BY MOUTH DAILY Patient taking differently: Take 5 mg by mouth daily. 09/06/20  Yes Koberlein, Steele Berg, MD  aspirin 81 MG tablet Take 81 mg by mouth at bedtime.   Yes [provider]  donepezil (ARICEPT) 5 MG tablet Take 1/2 tablet daily for 2 weeks, then increase to 1 tablet daily Patient taking differently: Take 5 mg by mouth in the morning. 06/24/20  Yes Cameron Sprang, MD  ferrous sulfate 325 (65 FE) MG tablet Take 325 mg by mouth every other day.    Yes [provider]  furosemide (LASIX) 40 MG tablet TAKE ONE TABLET BY MOUTH DAILY Patient taking differently: Take 40 mg by mouth in the  morning. 11/11/20  Yes Koberlein, Steele Berg, MD  HUMALOG KWIKPEN 100 UNIT/ML KwikPen INJECT 6 TO 10 UNITS INTO THE SKIN BEFORE EACH MEAL THREE TIMES A DAY Patient taking differently: Inject 6-10 Units into the skin 3 (three) times daily before meals. 04/16/20  Yes Philemon Kingdom, MD  insulin detemir (LEVEMIR FLEXTOUCH) 100 UNIT/ML FlexPen INJECT 20 UNITS INTO THE SKIN DAILY AT 10PM Patient taking differently: Inject 14-16 Units into the skin See admin instructions. Inject 14-16 units into the skin at 10 PM 09/06/20  Yes Philemon Kingdom, MD  Magnesium 200 MG TABS Take 1 tablet (200 mg total) by mouth daily. 12/17/17  Yes Lendon Colonel, NP  metFORMIN (GLUCOPHAGE) 1000 MG tablet TAKE ONE TABLET BY MOUTH TWICE A DAY WITH MEALS Patient taking differently: Take 1,000 mg by mouth 2 (two) times daily with a meal. 11/11/20  Yes Philemon Kingdom, MD  metoprolol succinate (TOPROL XL) 25 MG 24 hr tablet Take 0.5 tablets (12.5 mg total) by mouth daily. Patient taking differently: Take 12.5 mg by mouth at bedtime. 09/30/20  Yes Lelon Perla, MD  Multiple Vitamins-Minerals (CENTRUM SILVER PO) Take 1 tablet by mouth daily.    Yes [provider]  NON FORMULARY Place 1-2 sprays into both nostrils See admin instructions. [employer] Simply Saline Nasal Relief- Instill 1-2 sprays into each nostril one to two times a day   Yes [provider]  omeprazole (PRILOSEC) 20 MG capsule TAKE ONE CAPSULE BY MOUTH DAILY Patient taking differently: Take 20 mg by mouth daily before breakfast. 08/30/20  Yes Koberlein, Junell C, MD  pravastatin (PRAVACHOL) 40 MG tablet TAKE ONE TABLET BY MOUTH EVERY EVENING Patient taking differently: Take 40 mg by mouth every evening. 12/26/19  Yes Lelon Perla, MD  Probiotic Product (PROBIOTIC PO) Take 1 capsule by mouth 3 (three) times a week.   Yes [provider]  Propylene Glycol 0.6 % SOLN Place 2 drops into both eyes See admin instructions. Instill 2 drops into both eyes one to two times a day   Yes [provider]  sennosides-docusate sodium (SENOKOT-S) 8.6-50 MG tablet Take 1 tablet by mouth in the morning and at bedtime.   Yes [provider]  vitamin B-12 (CYANOCOBALAMIN) 1000 MCG tablet Take 1,000 mcg by mouth every other day.   Yes [provider]  Accu-Chek Softclix Lancets lancets Use to check blood sugar 5 times a day. 03/30/20   Philemon Kingdom, MD  B-D UF III MINI PEN NEEDLES 31G X 5 MM MISC USE TO TEST BLOOD SUGAR FOUR TIMES A DAY Patient taking differently: 4 (four) times daily. 12/01/19   Philemon Kingdom, MD  Blood Glucose Monitoring Suppl (ACCU-CHEK GUIDE) w/Device KIT 1 kit by Does not apply route See admin instructions. Check blood sugar 3 times a day 01/26/20   Philemon Kingdom, MD  glucose blood (ACCU-CHEK GUIDE) test strip Use to check blood sugar 3 times a day 03/19/20   Philemon Kingdom, MD    Physical Exam: Vitals:   12/06/2020 1800 11/13/2020 1830 12/01/2020 1900 11/27/2020 2030  BP: 116/78 114/74 126/86 119/83  Pulse: 92 (!)  115 (!) 57   Resp: (!) 24 (!) 24 (!) 24 (!) 30  SpO2: 93% 93% 92% 90%  Weight:      Height:        Physical Exam Constitutional:      General: He is not in acute distress. HENT:     Head: Normocephalic and atraumatic.  Mouth/Throat:     Pharynx: Oropharynx is clear.  Eyes:     Extraocular Movements: Extraocular movements intact.     Conjunctiva/sclera: Conjunctivae normal.  Neck:     Comments: JVD present Cardiovascular:     Rate and Rhythm: Tachycardia present. Rhythm irregular.     Pulses: Normal pulses.  Pulmonary:     Effort: Pulmonary effort is normal.     Breath sounds: Rales present.  Abdominal:     General: Bowel sounds are normal.     Palpations: Abdomen is soft.     Tenderness: There is no abdominal tenderness. There is no guarding.  Musculoskeletal:        General: No tenderness.     Cervical back: Normal range of motion and neck supple.     Comments: +1 pedal edema bilaterally  Skin:    General: Skin is warm and dry.  Neurological:     General: No focal deficit present.     Mental Status: He is alert and oriented to person, place, and time.     Labs on Admission: I have personally reviewed following labs and imaging studies  CBC: Recent Labs  Lab 11/28/2020 1620  WBC 8.1  HGB 11.0*  HCT 36.9*  MCV 100.5*  PLT 025   Basic Metabolic Panel: Recent Labs  Lab 11/15/2020 1620  NA 138  K 4.5  CL 104  CO2 24  GLUCOSE 231*  BUN 29*  CREATININE 1.10  CALCIUM 9.4  MG 1.6*   GFR: Estimated Creatinine Clearance: 50.7 mL/min (by C-G formula based on SCr of 1.1 mg/dL). Liver Function Tests: Recent Labs  Lab 11/16/2020 1620  AST 25  ALT 22  ALKPHOS 34*  BILITOT 1.0  PROT 6.4*  ALBUMIN 3.7   No results for input(s): LIPASE, AMYLASE in the last 168 hours. No results for input(s): AMMONIA in the last 168 hours. Coagulation Profile: No results for input(s): INR, PROTIME in the last 168 hours. Cardiac Enzymes: No results for input(s): CKTOTAL,  CKMB, CKMBINDEX, TROPONINI in the last 168 hours. BNP (last 3 results) No results for input(s): PROBNP in the last 8760 hours. HbA1C: No results for input(s): HGBA1C in the last 72 hours. CBG: Recent Labs  Lab 12/03/2020 2028  GLUCAP 94   Lipid Profile: No results for input(s): CHOL, HDL, LDLCALC, TRIG, CHOLHDL, LDLDIRECT in the last 72 hours. Thyroid Function Tests: No results for input(s): TSH, T4TOTAL, FREET4, T3FREE, THYROIDAB in the last 72 hours. Anemia Panel: No results for input(s): VITAMINB12, FOLATE, FERRITIN, TIBC, IRON, RETICCTPCT in the last 72 hours. Urine analysis:    Component Value Date/Time   COLORURINE YELLOW 08/06/2019 [place] 08/06/2019 0718   LABSPEC 1.020 08/06/2019 0718   PHURINE 6.0 08/06/2019 0718   GLUCOSEU NEGATIVE 08/06/2019 0718   HGBUR SMALL (A) 08/06/2019 0718   HGBUR trace-lysed 12/21/2008 0751   BILIRUBINUR NEGATIVE 08/06/2019 0718   BILIRUBINUR n 01/17/2013 1634   KETONESUR NEGATIVE 08/06/2019 0718   PROTEINUR 100 (A) 08/26/2015 1750   UROBILINOGEN 0.2 08/06/2019 0718   NITRITE NEGATIVE 08/06/2019 0718   LEUKOCYTESUR NEGATIVE 08/06/2019 0718    Radiological Exams on Admission: DG Chest 1 View  Result Date: 11/23/2020 CLINICAL DATA:  cp EXAM: CHEST  1 VIEW COMPARISON:  08/26/2015 and prior. FINDINGS: No pneumothorax. Small bilateral pleural effusions and patchy basilar opacities. Cardiomegaly and central pulmonary vascular congestion. Multilevel spondylosis. IMPRESSION: Cardiomegaly and central pulmonary vascular congestion. Bibasilar opacities and small pleural effusions, edema versus infection. Electronically Signed  By: Primitivo Gauze M.D.   On: 12/09/2020 16:46    EKG: Independently reviewed.  Initial EKG showing A. fib with RVR, ST abnormality in lateral leads.  Repeat EKG showing A. fib with RVR, PVCs, and resolution of ST abnormality in lateral leads.  Assessment/Plan Principal Problem:   Atrial fibrillation  with rapid ventricular response (HCC) Active Problems:   CAD (coronary artery disease)   Hyperlipidemia associated with type 2 diabetes mellitus   CHF exacerbation (HCC)   NSVT (nonsustained ventricular tachycardia) (HCC)   A. fib with RVR: Rate in the 140s in the ED initially.  He was given IV Lopressor 5 mg in the ED and rate currently in the 110s-120s.  Per cardiology note, no prior history of A. fib and recent ZIO monitor did not show any A. Fib.  -CHA2DS2VASc 8 and cardiology has started IV heparin given elevated troponins and need for ischemic evaluation (possibly cath).  Cardiology has also started Cardizem infusion for rate control.  Recommending continuing home dose of metoprolol.  Patient will likely need a TEE if rate is not adequately controlled and he ends up requiring cardioversion.  Plan is to eventually start him on Eliquis.  Also check TSH level.  Unstable angina/demand ischemia/nonobstructive CAD: Per cardiology note, patient has a history of nonobstructive CAD by cardiac catheterization done in 2015 and had a low risk Myoview in 2019.  High-sensitivity troponin elevated but stable (809 >795).  He has been having exertional chest pain, dyspnea, and presyncope for the past 3 days and found to be in A. fib with RVR today.  Asymptomatic at rest. -Cardiology recommending trending troponin but low threshold to pursue diagnostic catheterization.  He has been started on IV heparin.  Received full dose aspirin in the ED.  Recommending continuing home dose of aspirin, metoprolol, and statin.  Acute on chronic diastolic CHF: Likely precipitating factor is rapid A. fib.  Echo done 10/26/2020 showing normal LVEF of 55-60% and grade 2 diastolic dysfunction.  Does volume overloaded on exam with JVD, rales, and mild peripheral edema.  BNP 975.  Chest x-ray showing cardiomegaly and central pulmonary vascular congestion.  Also showing small pleural effusions and bibasilar opacities.  Bacterial  pneumonia less likely given no leukocytosis on labs.  SARS-CoV-2 PCR test and influenza panel both negative. -Cardiology has started IV Lasix 40 mg twice daily.  Monitor intake and output, daily weights, and low-sodium diet with fluid restriction.  NSVT/ hypomagnesemia: Magnesium level 1.6. -Patient was given IV magnesium 2 g in the ED.  Monitor potassium and magnesium levels.  Moderate aortic stenosis: Seen on recent echo. -Cardiology team following  Hypertension: Stable. -Continue home metoprolol and Lasix as mentioned above for diuresis.  Hyperlipidemia: LDL 49 in October 2021. -Continue statin  Insulin-dependent type 2 diabetes: A1c 6.8 on labs done 09/14/2020.  Patient's wife is reporting fluctuation of his blood glucose values at home.  Blood glucose 106 on metabolic panel.  Patient was given NovoLog 5 units in the ED. -Continue home Levemir 15 units at bedtime.  Order sliding scale insulin sensitive ACHS.  Hold Metformin.  Macrocytic anemia: Hemoglobin 11.0, hematocrit 36.9, MCV 100.5.  Hemoglobin was 13 on labs done in October this year.  Patient did not endorse any symptoms of GI bleed. -On iron supplement at home, continue.  Check anemia panel and FOBT.  Dementia -Continue home Aricept  GERD -Continue PPI  Chronic constipation -Continue home Senokot-S  DVT prophylaxis: Heparin Code Status: Patient wishes to be full code. Family  Communication: Wife at bedside. Disposition Plan: Status is: Inpatient  Remains inpatient appropriate because:IV treatments appropriate due to intensity of illness or inability to take PO and Inpatient level of care appropriate due to severity of illness   Dispo: The patient is from: Home              Anticipated d/c is to: Home              Anticipated d/c date is: > 3 days              Patient currently is not medically stable to d/c.  The medical decision making on this patient was of high complexity and the patient is at high risk for  clinical deterioration, therefore this is a level 3 visit.  Shela Leff MD [facility]  If 7PM-7AM, please contact night-coverage [email or web address]  12/07/2020, 9:00 PM

## 2020-11-29 NOTE — ED Triage Notes (Signed)
Pt arrived via [facility] from [place] for 7/10 non radiating left sided chest pain, weakness, and SOB w/exertionx3-4 days. Pt states chest pain is 1/10 at rest. Pt is in A-fib 120-140s. Pt is A&Ox4. Pt is A-fib on the monitor. VSS.

## 2020-11-30 ENCOUNTER — Encounter (HOSPITAL_COMMUNITY): Payer: Self-pay | Admitting: Interventional Cardiology

## 2020-11-30 ENCOUNTER — Inpatient Hospital Stay (HOSPITAL_COMMUNITY): Admission: EM | Disposition: E | Payer: Self-pay | Source: Home / Self Care | Attending: Internal Medicine

## 2020-11-30 DIAGNOSIS — R778 Other specified abnormalities of plasma proteins: Secondary | ICD-10-CM

## 2020-11-30 HISTORY — PX: LEFT HEART CATH AND CORONARY ANGIOGRAPHY: CATH118249

## 2020-11-30 LAB — CBC
HCT: 33.9 % — ABNORMAL LOW (ref 39.0–52.0)
Hemoglobin: 10.3 g/dL — ABNORMAL LOW (ref 13.0–17.0)
MCH: 29.9 pg (ref 26.0–34.0)
MCHC: 30.4 g/dL (ref 30.0–36.0)
MCV: 98.3 fL (ref 80.0–100.0)
Platelets: 189 10*3/uL (ref 150–400)
RBC: 3.45 MIL/uL — ABNORMAL LOW (ref 4.22–5.81)
RDW: 14.6 % (ref 11.5–15.5)
WBC: 7.6 10*3/uL (ref 4.0–10.5)
nRBC: 0 % (ref 0.0–0.2)

## 2020-11-30 LAB — IRON AND TIBC
Iron: 35 ug/dL — ABNORMAL LOW (ref 45–182)
Saturation Ratios: 10 % — ABNORMAL LOW (ref 17.9–39.5)
TIBC: 363 ug/dL (ref 250–450)
UIBC: 328 ug/dL

## 2020-11-30 LAB — POCT I-STAT 7, (LYTES, BLD GAS, ICA,H+H)
Acid-base deficit: 4 mmol/L — ABNORMAL HIGH (ref 0.0–2.0)
Bicarbonate: 20.8 mmol/L (ref 20.0–28.0)
Calcium, Ion: 1.23 mmol/L (ref 1.15–1.40)
HCT: 34 % — ABNORMAL LOW (ref 39.0–52.0)
Hemoglobin: 11.6 g/dL — ABNORMAL LOW (ref 13.0–17.0)
O2 Saturation: 84 %
Potassium: 4.5 mmol/L (ref 3.5–5.1)
Sodium: 140 mmol/L (ref 135–145)
TCO2: 22 mmol/L (ref 22–32)
pCO2 arterial: 37.1 mmHg (ref 32.0–48.0)
pH, Arterial: 7.356 (ref 7.350–7.450)
pO2, Arterial: 50 mmHg — ABNORMAL LOW (ref 83.0–108.0)

## 2020-11-30 LAB — TROPONIN I (HIGH SENSITIVITY)
Troponin I (High Sensitivity): 966 ng/L (ref <18)
Troponin I (High Sensitivity): 1014 ng/L (ref <18)
Troponin I (High Sensitivity): 945 ng/L (ref <18)

## 2020-11-30 LAB — TRIGLYCERIDES: Triglycerides: 43 mg/dL (ref <150)

## 2020-11-30 LAB — MAGNESIUM: Magnesium: 1.8 mg/dL (ref 1.7–2.4)

## 2020-11-30 LAB — VITAMIN B12: Vitamin B-12: 923 pg/mL — ABNORMAL HIGH (ref 180–914)

## 2020-11-30 LAB — RETICULOCYTES
Immature Retic Fract: 25.6 % — ABNORMAL HIGH (ref 2.3–15.9)
RBC.: 3.44 MIL/uL — ABNORMAL LOW (ref 4.22–5.81)
Retic Count, Absolute: 63.3 10*3/uL (ref 19.0–186.0)
Retic Ct Pct: 1.8 % (ref 0.4–3.1)

## 2020-11-30 LAB — GLUCOSE, CAPILLARY
Glucose-Capillary: 174 mg/dL — ABNORMAL HIGH (ref 70–99)
Glucose-Capillary: 181 mg/dL — ABNORMAL HIGH (ref 70–99)
Glucose-Capillary: 206 mg/dL — ABNORMAL HIGH (ref 70–99)
Glucose-Capillary: 211 mg/dL — ABNORMAL HIGH (ref 70–99)
Glucose-Capillary: 220 mg/dL — ABNORMAL HIGH (ref 70–99)
Glucose-Capillary: 250 mg/dL — ABNORMAL HIGH (ref 70–99)

## 2020-11-30 LAB — PROTIME-INR
INR: 1.2 (ref 0.8–1.2)
Prothrombin Time: 14.4 seconds (ref 11.4–15.2)

## 2020-11-30 LAB — FERRITIN: Ferritin: 22 ng/mL — ABNORMAL LOW (ref 24–336)

## 2020-11-30 LAB — HEPARIN LEVEL (UNFRACTIONATED): Heparin Unfractionated: <0.1 IU/mL — ABNORMAL LOW (ref 0.30–0.70)

## 2020-11-30 LAB — FOLATE: Folate: 34 ng/mL (ref >5.9)

## 2020-11-30 SURGERY — LEFT HEART CATH AND CORONARY ANGIOGRAPHY
Anesthesia: LOCAL

## 2020-11-30 MED ORDER — HEPARIN SODIUM (PORCINE) 1000 UNIT/ML IJ SOLN
INTRAMUSCULAR | Status: AC
  Filled 2020-11-30: qty 1

## 2020-11-30 MED ORDER — FUROSEMIDE 10 MG/ML IJ SOLN
INTRAMUSCULAR | Status: AC
  Filled 2020-11-30: qty 4

## 2020-11-30 MED ORDER — LIDOCAINE HCL (PF) 1 % IJ SOLN
INTRAMUSCULAR | Status: DC | PRN
  Administered 2020-11-30: 2 mL

## 2020-11-30 MED ORDER — HEPARIN BOLUS VIA INFUSION
2000.0000 [IU] | Freq: Once | INTRAVENOUS | Status: AC
  Administered 2020-11-30: 04:00:00 2000 [IU] via INTRAVENOUS
  Filled 2020-11-30: qty 2000

## 2020-11-30 MED ORDER — VERAPAMIL HCL 2.5 MG/ML IV SOLN
INTRAVENOUS | Status: DC | PRN
  Administered 2020-11-30: 11:00:00 10 mL via INTRA_ARTERIAL

## 2020-11-30 MED ORDER — HEPARIN SODIUM (PORCINE) 1000 UNIT/ML IJ SOLN
INTRAMUSCULAR | Status: DC | PRN
  Administered 2020-11-30: 4000 [IU] via INTRAVENOUS

## 2020-11-30 MED ORDER — MIDAZOLAM HCL 2 MG/2ML IJ SOLN
INTRAMUSCULAR | Status: AC
  Filled 2020-11-30: qty 2

## 2020-11-30 MED ORDER — ACETAMINOPHEN 325 MG PO TABS: 650.0000 mg | ORAL_TABLET | ORAL | Status: DC | PRN

## 2020-11-30 MED ORDER — SODIUM CHLORIDE 0.9 % IV SOLN: 250.0000 mL | INTRAVENOUS | Status: DC | PRN

## 2020-11-30 MED ORDER — LIDOCAINE HCL (PF) 1 % IJ SOLN
INTRAMUSCULAR | Status: AC
  Filled 2020-11-30: qty 30

## 2020-11-30 MED ORDER — ONDANSETRON HCL 4 MG/2ML IJ SOLN
4.0000 mg | Freq: Four times a day (QID) | INTRAMUSCULAR | Status: DC | PRN
  Administered 2020-12-01: 06:00:00 4 mg via INTRAVENOUS
  Filled 2020-11-30: qty 2

## 2020-11-30 MED ORDER — SODIUM CHLORIDE 0.9% FLUSH
3.0000 mL | INTRAVENOUS | Status: DC | PRN
Start: 2020-11-30 — End: 2020-12-04

## 2020-11-30 MED ORDER — METOPROLOL TARTRATE 25 MG PO TABS
25.0000 mg | ORAL_TABLET | Freq: Three times a day (TID) | ORAL | Status: DC
  Administered 2020-11-30 (×2): 25 mg via ORAL
  Filled 2020-11-30 (×2): qty 1

## 2020-11-30 MED ORDER — APIXABAN 5 MG PO TABS
5.0000 mg | ORAL_TABLET | Freq: Two times a day (BID) | ORAL | Status: DC
  Administered 2020-11-30: 22:00:00 5 mg via ORAL
  Filled 2020-11-30: qty 1

## 2020-11-30 MED ORDER — FUROSEMIDE 10 MG/ML IJ SOLN
40.0000 mg | Freq: Once | INTRAMUSCULAR | Status: AC
  Administered 2020-11-30: 22:00:00 40 mg via INTRAVENOUS
  Filled 2020-11-30: qty 4

## 2020-11-30 MED ORDER — IOHEXOL 350 MG/ML SOLN
INTRAVENOUS | Status: DC | PRN
  Administered 2020-11-30: 11:00:00 55 mL

## 2020-11-30 MED ORDER — VERAPAMIL HCL 2.5 MG/ML IV SOLN
INTRAVENOUS | Status: AC
  Filled 2020-11-30: qty 2

## 2020-11-30 MED ORDER — FENTANYL CITRATE (PF) 100 MCG/2ML IJ SOLN
INTRAMUSCULAR | Status: AC
  Filled 2020-11-30: qty 2

## 2020-11-30 MED ORDER — FENTANYL CITRATE (PF) 100 MCG/2ML IJ SOLN
INTRAMUSCULAR | Status: DC | PRN
  Administered 2020-11-30: 25 ug via INTRAVENOUS

## 2020-11-30 MED ORDER — LABETALOL HCL 5 MG/ML IV SOLN: 10.0000 mg | INTRAVENOUS | Status: AC | PRN

## 2020-11-30 MED ORDER — HYDRALAZINE HCL 20 MG/ML IJ SOLN: 10.0000 mg | INTRAMUSCULAR | Status: AC | PRN

## 2020-11-30 MED ORDER — HEPARIN (PORCINE) IN NACL 1000-0.9 UT/500ML-% IV SOLN
INTRAVENOUS | Status: AC
  Filled 2020-11-30: qty 1000

## 2020-11-30 MED ORDER — SODIUM CHLORIDE 0.9% FLUSH
3.0000 mL | Freq: Two times a day (BID) | INTRAVENOUS | Status: DC
  Administered 2020-11-30 – 2020-12-04 (×5): 3 mL via INTRAVENOUS

## 2020-11-30 MED ORDER — FUROSEMIDE 10 MG/ML IJ SOLN
INTRAMUSCULAR | Status: DC | PRN
  Administered 2020-11-30: 40 mg via INTRAVENOUS

## 2020-11-30 MED ORDER — MIDAZOLAM HCL 2 MG/2ML IJ SOLN
INTRAMUSCULAR | Status: DC | PRN
  Administered 2020-11-30: 1 mg via INTRAVENOUS

## 2020-11-30 SURGICAL SUPPLY — 10 items
CATH 5FR JL3.5 JR4 ANG PIG MP (CATHETERS) ×1 IMPLANT
DEVICE RAD COMP TR BAND LRG (VASCULAR PRODUCTS) ×1 IMPLANT
GLIDESHEATH SLEND SS 6F .021 (SHEATH) ×1 IMPLANT
GUIDEWIRE INQWIRE 1.5J.035X260 (WIRE) IMPLANT
INQWIRE 1.5J .035X260CM (WIRE) ×2
KIT HEART LEFT (KITS) ×2 IMPLANT
PACK CARDIAC CATHETERIZATION (CUSTOM PROCEDURE TRAY) ×2 IMPLANT
SHEATH PROBE COVER 6X72 (BAG) ×1 IMPLANT
TRANSDUCER W/STOPCOCK (MISCELLANEOUS) ×2 IMPLANT
TUBING CIL FLEX 10 FLL-RA (TUBING) ×2 IMPLANT

## 2020-11-30 NOTE — Progress Notes (Signed)
   12/04/2020 0040  Assess: MEWS Score  Temp 97.7 F (36.5 C)  BP 97/61  Pulse Rate 64  ECG Heart Rate (!) 124  Resp (!) 29  SpO2 90 %  O2 Device Room Air  Assess: MEWS Score  MEWS Temp 0  MEWS Systolic 1  MEWS Pulse 2  MEWS RR 2  MEWS LOC 0  MEWS Score 5  MEWS Score Color Red  Assess: if the MEWS score is Yellow or Red  Were vital signs taken at a resting state? Yes  Focused Assessment Change from prior assessment (see assessment flowsheet)  Early Detection of Sepsis Score *See Row Information* Low  MEWS guidelines implemented *See Row Information* Yes  Treat  MEWS Interventions Escalated (See documentation below)  Pain Scale 0-10  Pain Score 0  Take Vital Signs  Increase Vital Sign Frequency  Red: Q 1hr X 4 then Q 4hr X 4, if remains red, continue Q 4hrs  Escalate  MEWS: Escalate Red: discuss with charge nurse/RN and provider, consider discussing with RRT  Notify: Charge Nurse/RN  Name of Charge Nurse/RN Notified Christina RN  Date Charge Nurse/RN Notified 11/23/2020  Time Charge Nurse/RN Notified 0117  Notify: Provider  Provider Name/Title Hal Hope MD  Date Provider Notified 11/24/2020  Time Provider Notified [phone]  Notification Type Page  Notification Reason Other (Comment) (Red MEWs)  Response No new orders

## 2020-11-30 NOTE — Progress Notes (Addendum)
Patient bladder scanned, only 83 ml. Upon assessment patient denies shortness of breath when lying still but feels differently when moving around. Both lungs have crackles and RR 25-30. Paged Cardiology for iv lasix orders. Gave 1x 40 mg iv lasix Will continue to monitor patient.   Update: patient alert and oriented x2-3. Patient confused, trying to get out of bed. Easily oriented at the moment.   8242 update: patient get progressively confused. Pulling at lines and trying to get out of bed. Oriented x3.

## 2020-11-30 NOTE — Progress Notes (Addendum)
Progress Note  Patient Name: [name] Date of Encounter: 11/17/2020  [facility] Cardiologist: Kirk Ruths, MD  Subjective   Chest pain and dizzy feeling when trying to having BM. Reported intermittent constipation at home. Breathing stable.   Inpatient Medications    Scheduled Meds: . acetaminophen  1,000 mg Oral TID  . aspirin EC  81 mg Oral QHS  . donepezil  5 mg Oral q AM  . ferrous sulfate  325 mg Oral QODAY  . furosemide  40 mg Intravenous BID  . insulin aspart  0-5 Units Subcutaneous QHS  . insulin aspart  0-9 Units Subcutaneous TID WC  . insulin detemir  15 Units Subcutaneous QHS  . metoprolol succinate  12.5 mg Oral Daily  . pantoprazole  40 mg Oral Daily  . pravastatin  40 mg Oral QPM  . senna-docusate  1 tablet Oral BID  . sodium chloride flush  3 mL Intravenous Q12H   Continuous Infusions: . sodium chloride    . sodium chloride    . diltiazem (CARDIZEM) infusion 15 mg/hr (12/03/2020 [id number])  . heparin 1,350 Units/hr (12/08/2020 0345)   PRN Meds: sodium chloride, sodium chloride flush   Vital Signs    Vitals:   11/15/2020 0238 12/04/2020 0337 12/06/2020 0438 11/24/2020 0506  BP: 100/65 116/90 91/73 119/74  Pulse: 87 (!) 48 87   Resp: (!) 23 (!) 21 14   Temp: 98.5 F (36.9 C) 97.7 F (36.5 C) 98.7 F (37.1 C)   TempSrc: Oral Oral Axillary   SpO2: 92% 90% 94%   Weight:   85.9 kg   Height:   5\' 10"  (1.778 m)     Intake/Output Summary (Last 24 hours) at 11/29/2020 0710 Last data filed at 11/16/2020 [id number] Gross per 24 hour  Intake 258.26 ml  Output 200 ml  Net 58.26 ml   Last 3 Weights 11/15/2020 11/20/2020 09/30/2020  Weight (lbs) 189 lb 6 oz 182 lb 1.6 oz 182 lb 1.6 oz  Weight (kg) 85.9 kg 82.6 kg 82.6 kg      Telemetry    Atrial fibrillation at 90s- Personally Reviewed  ECG    Atrial fibrillation, PVCs, ST depression in anterior lateral leads  - Personally Reviewed  Physical Exam   GEN: No acute distress.   Neck: No JVD Cardiac:  Ir IR , +  murmurs, rubs, or gallops.  Respiratory: Clear to auscultation bilaterally. GI: Soft, nontender, non-distended  MS: No edema; No deformity. Neuro:  Nonfocal  Psych: Normal affect   Labs    High Sensitivity Troponin:   Recent Labs  Lab 11/22/2020 1620 12/02/2020 1810 12/03/2020 2101 11/26/2020 2326  TROPONINIHS 809* 795* 970* 966*      Chemistry Recent Labs  Lab 11/13/2020 1620  NA 138  K 4.5  CL 104  CO2 24  GLUCOSE 231*  BUN 29*  CREATININE 1.10  CALCIUM 9.4  PROT 6.4*  ALBUMIN 3.7  AST 25  ALT 22  ALKPHOS 34*  BILITOT 1.0  GFRNONAA >60  ANIONGAP 10     Hematology Recent Labs  Lab 11/16/2020 1620 11/18/2020 2326 11/30/20 0215  WBC 8.1 7.7 7.6  RBC 3.67* 3.52* 3.45*  3.44*  HGB 11.0* 10.6* 10.3*  HCT 36.9* 35.2* 33.9*  MCV 100.5* 100.0 98.3  MCH 30.0 30.1 29.9  MCHC 29.8* 30.1 30.4  RDW 14.5 14.6 14.6  PLT 207 193 189    BNP Recent Labs  Lab 12/08/2020 1626  BNP 975.6*     Radiology  DG Chest 1 View  Result Date: 11/20/2020 CLINICAL DATA:  cp EXAM: CHEST  1 VIEW COMPARISON:  08/26/2015 and prior. FINDINGS: No pneumothorax. Small bilateral pleural effusions and patchy basilar opacities. Cardiomegaly and central pulmonary vascular congestion. Multilevel spondylosis. IMPRESSION: Cardiomegaly and central pulmonary vascular congestion. Bibasilar opacities and small pleural effusions, edema versus infection. Electronically Signed   By: Primitivo Gauze M.D.   On: 12/09/2020 16:46    Cardiac Studies   Echocardiogram 10/26/2020 1. Left ventricular ejection fraction, by estimation, is 55 to 60%. The  left ventricle has normal function. The left ventricle has no regional  wall motion abnormalities. Left ventricular diastolic parameters are  consistent with Grade II diastolic  dysfunction (pseudonormalization).  2. Right ventricular systolic function is normal. The right ventricular  size is normal. There is mildly elevated pulmonary artery  systolic  pressure. The estimated right ventricular systolic pressure is [id number] mmHg.  3. Left atrial size was moderately dilated.  4. Right atrial size was mildly dilated.  5. The mitral valve is abnormal. Mild mitral valve regurgitation.  6. The aortic valve is tricuspid. There is moderate calcification of the  aortic valve. There is moderate thickening of the aortic valve. Aortic  valve regurgitation is not visualized. Moderate aortic valve stenosis.   Patient Profile     86 y.o. male with a history of nonobs CAD, HTN, HL, PVCs, diast dysfxn, lacunar infarcts, DMII, GERD, anemia, and remote DVT (approximately 50 years ago), who is being seen for the evaluation of rapid afib and chest pain with elevated troponin at the request of Dr. Dina Rich.  Assessment & Plan    1. Atrial fibrillation with RVR - rate controlled at 90s-100s on IV Cardizem 15cc/hour>> will continue  - CHADSVASCs score of 8. On heparin for anticoagulation. Transition to NOAC post cath. TEE/DCCV this admit vs DCCV as outpatient.   2. NSTEMI vs demand ischemia with prior hx of non obstructive CAD by cath in 2015 - Intermittent chest chest pain (Last episode this morning around 6 AM. ? Could be constipation related). -Recheck troponin  - EKG likely with more pronounced ST changes in anterior lateral leads - Continue heparin - For LHC today  3. Acute on chronic diastolic CHF - Likely rate related  - Continue to follow   4. Constipation - Reported intermittent constipation - Had episode of chest pain this morning while trying to have BM - Consider home stool softer   Otherwise per primary team    For questions or updates, please contact [place] Please consult [email or web address] for contact info under        Signed, Leanor Kail, PA  11/29/2020, 7:10 AM    Patient seen and examined with [facility] PA.  Agree as above, with the following exceptions and changes as noted below. [name] is now  resting comfortably after his bath, anticipating LHC today. Had a few episodes of diaphoresis with elevated blood glucose(200 overnight and 181 this am) . Gen: NAD, CV: RRR, 2/6 mid peaking SEM, Lungs: clear, Abd: soft, Extrem: Warm, well perfused, trivial bilat edema, Neuro/Psych: alert and oriented x 3, normal mood and affect. All available labs, radiology testing, previous records reviewed.   Planning for [facility] today for angina and elevated troponin in the setting of afib RVR.   INFORMED CONSENT (obtained yesterday during consult): I have reviewed the risks, indications, and alternatives to cardiac catheterization, possible angioplasty, and stenting with the patient. Risks include but are not limited to bleeding, infection, vascular  injury, stroke, myocardial infection, arrhythmia, kidney injury, radiation-related injury in the case of prolonged fluoroscopy use, emergency cardiac surgery, and death. The patient understands the risks of serious complication is 1-2 in 2297 with diagnostic cardiac cath and 1-2% or less with angioplasty/stenting.   Elouise Munroe, MD December 19, 2020 10:16 AM

## 2020-11-30 NOTE — Progress Notes (Addendum)
Patient had an episode of dizziness, shakiness, and shortness of breath. Patient stated it could be his sugar because he gets like this when its low. CBG 206. Upon assessment patient denies, chest pain, shortness of breath, or palpitations. He states that he feels hot at times and his breathing is not right. HR 90s-115s afib, bp 119/74, 94% o2 [place] 2L , RR 20s. Will continue to monitor patient.   Update 0620: patient states having 2/10 chest pain with exertion. But when laying still, it goes away.

## 2020-11-30 NOTE — Progress Notes (Signed)
RN and charge nurse made aware of RED MEWS.

## 2020-11-30 NOTE — Progress Notes (Addendum)
   Went to check on patient following cardiac cath today. Cath showed non-obstructive disease. LVEF was 25-35% by visual estimate and LVEDP 30. Dose of IV Lasix 40mg  was given during cath. Patient denies any shortness of breath but he does have mild increased work of breathing. Currently on 5L via nasal cannula. 500cc of urine output this afternoon. Crackles noted in bilateral bases. Irregularly irregular rhythm with rates in the 90's to 110's. Possible systolic murmur noted at apex (possible MR). Will check Echo. Given reduced EF on cath, will stop IV Cardizem when current bag runs out and start Lopressor 25mg  three times daily. Will go ahead and give evening dose of IV Lasix 40mg  now. Will check on patient before I leave for the evening to see if he needs another dose.  Darreld Mclean, PA-C 07-Dec-2020 5:13 PM   Addendum:  While up seeing another patient on 6e, Dr. Margaretann Loveless spoke with patient's RN. He still had not had significant urine output after most recent dose of IV Lasix. Dr. Margaretann Loveless recommended bladder scan and in and out cath if needed. RN will notify overnight fellow of results of bladder scan.  Darreld Mclean, PA-C December 07, 2020 8:37 PM

## 2020-11-30 NOTE — Interval H&P Note (Signed)
Cath Lab Visit (complete for each Cath Lab visit)  Clinical Evaluation Leading to the Procedure:   ACS: Yes.    Non-ACS:    Anginal Classification: CCS IV  Anti-ischemic medical therapy: Minimal Therapy (1 class of medications)  Non-Invasive Test Results: No non-invasive testing performed  Prior CABG: No previous CABG      History and Physical Interval Note:  11/23/2020 10:25 AM  [name]  has presented today for surgery, with the diagnosis of NSTEMI.  The various methods of treatment have been discussed with the patient and family. After consideration of risks, benefits and other options for treatment, the patient has consented to  Procedure(s): LEFT HEART CATH AND CORONARY ANGIOGRAPHY (N/A) as a surgical intervention.  The patient's history has been reviewed, patient examined, no change in status, stable for surgery.  I have reviewed the patient's chart and labs.  Questions were answered to the patient's satisfaction.     Larae Grooms

## 2020-11-30 NOTE — Telephone Encounter (Signed)
Checked patient's chart and is in the hospital now with A. fib with RVR.  This was probably the reason for his weakness and fluctuating CBGs.

## 2020-11-30 NOTE — Plan of Care (Signed)
  Problem: Education: Goal: Knowledge of General Education information will improve Description: Including pain rating scale, medication(s)/side effects and non-pharmacologic comfort measures Outcome: Progressing   Problem: Health Behavior/Discharge Planning: Goal: Ability to manage health-related needs will improve Outcome: Progressing   Problem: Clinical Measurements: Goal: Ability to maintain clinical measurements within normal limits will improve Outcome: Progressing Goal: Will remain free from infection Outcome: Progressing Goal: Diagnostic test results will improve Outcome: Progressing Goal: Respiratory complications will improve Outcome: Progressing Goal: Cardiovascular complication will be avoided Outcome: Progressing   Problem: Pain Managment: Goal: General experience of comfort will improve Outcome: Progressing   Problem: Safety: Goal: Ability to remain free from injury will improve Outcome: Progressing   Problem: Education: Goal: Knowledge of disease or condition will improve Outcome: Progressing Goal: Understanding of medication regimen will improve Outcome: Progressing   Problem: Activity: Goal: Ability to tolerate increased activity will improve Outcome: Progressing   Problem: Cardiac: Goal: Ability to achieve and maintain adequate cardiopulmonary perfusion will improve Outcome: Progressing

## 2020-11-30 NOTE — Progress Notes (Signed)
PROGRESS NOTE    [name]  QQI:[id number] DOB: 1933-10-09 DOA: 12/07/2020 PCP: Caren Macadam, MD  Brief Narrative: 86 year old male with history of CAD, chronic diastolic CHF, moderate aortic stenosis, history of PVCs/NSVT on metoprolol, type 2 diabetes mellitus on insulin, hypertension, neuropathy presented to urgent care 12/20 with chest pain dyspnea and near syncope, he was noted to be in A. fib RVR and subsequently sent to the emergency room.  -Symptoms of progressive worsening dyspnea for 3 days, intermittent substernal chest pressure. -In the ED patient was noted to be in A. fib RVR with heart rate in the 140s, noted to have elevated high-sensitivity troponin of 800, BNP of 975, chest x-ray noted mild pulmonary edema  Subjective -Continues to have chest pain, about to go down for heart cath now  Assessment & Plan:     Atrial fibrillation with rapid ventricular response (HCC) -Heart rate improving, currently on Cardizem drip at 15 mg/h -Continue IV heparin drip and transition to novel anticoagulant post cath -Replace mag  Non-ST elevation MI versus demand ischemia -Continues to have intermittent chest pain/pressure -EKG with ST-T wave changes -Continue IV heparin, aspirin, metoprolol, statin -Plan for left heart catheterization today  Acute on chronic diastolic CHF History of moderate aortic stenosis -Last echo in November with preserved EF and grade 2 diastolic dysfunction -Continue IV Lasix  Type 2 diabetes mellitus on insulin -Last hemoglobin A1c was 6.8 in October -On Levemir, hold Metformin, sliding scale insulin  Macrocytic anemia -Monitor  Mild memory loss, early dementia -Continue Aricept  DVT prophylaxis: IV heparin Code Status: Full code, will need CODE STATUS to be discussed Family Communication: Wife at bedside Disposition Plan:  Status is: Inpatient  Remains inpatient appropriate because:Inpatient level of care appropriate due to severity  of illness   Dispo: The patient is from: Home              Anticipated d/c is to: Likely home              Anticipated d/c date is: 3 days              Patient currently is not medically stable to d/c.  Consultants:   Cardiology   Procedures:   Antimicrobials:   Objective: Vitals:   11/17/2020 1042 12/02/2020 1047 11/18/2020 1052 11/15/2020 1057  BP: 115/73 109/74 (!) 110/59 120/78  Pulse: (!) 59 (!) 50 70 (!) 54  Resp: (!) 35 (!) 21 (!) 29 (!) 30  Temp:      TempSrc:      SpO2:   (!) 84% (!) 84%  Weight:      Height:        Intake/Output Summary (Last 24 hours) at 12/07/2020 1103 Last data filed at 12/09/2020 0830 Gross per 24 hour  Intake 261.26 ml  Output 200 ml  Net 61.26 ml   Filed Weights   12/04/2020 1616 11/19/2020 0438  Weight: 82.6 kg 85.9 kg    Examination:  General exam: Elderly frail appearing male sitting up in bed, AAOx3, no distress CVS: S1-S2, regular rhythm, systolic ejection murmur Lungs: Few basilar rails Abdomen: Soft, nontender, bowel sounds present Extremities: No edema Skin: No rashes on exposed skin Psych: Appropriate mood and affect  Data Reviewed:   CBC: Recent Labs  Lab 11/24/2020 1620 11/26/2020 2326 12/04/2020 0215  WBC 8.1 7.7 7.6  NEUTROABS  --  4.2  --   HGB 11.0* 10.6* 10.3*  HCT 36.9* 35.2* 33.9*  MCV 100.5* 100.0 98.3  PLT 207 193 782   Basic Metabolic Panel: Recent Labs  Lab 12/06/2020 1620 11/14/2020 0215  NA 138  --   K 4.5  --   CL 104  --   CO2 24  --   GLUCOSE 231*  --   BUN 29*  --   CREATININE 1.10  --   CALCIUM 9.4  --   MG 1.6* 1.8   GFR: Estimated Creatinine Clearance: 50.7 mL/min (by C-G formula based on SCr of 1.1 mg/dL). Liver Function Tests: Recent Labs  Lab 12/04/2020 1620  AST 25  ALT 22  ALKPHOS 34*  BILITOT 1.0  PROT 6.4*  ALBUMIN 3.7   No results for input(s): LIPASE, AMYLASE in the last 168 hours. No results for input(s): AMMONIA in the last 168 hours. Coagulation Profile: Recent Labs   Lab 11/19/2020 0215  INR 1.2   Cardiac Enzymes: No results for input(s): CKTOTAL, CKMB, CKMBINDEX, TROPONINI in the last 168 hours. BNP (last 3 results) No results for input(s): PROBNP in the last 8760 hours. HbA1C: Recent Labs    11/28/2020 2326  HGBA1C 7.0*   CBG: Recent Labs  Lab 11/17/2020 2028 11/27/2020 2319 11/13/2020 0039 11/29/2020 0506 11/21/2020 0737  GLUCAP 94 157* 174* 206* 181*   Lipid Profile: Recent Labs    12/07/2020 2326  TRIG 43   Thyroid Function Tests: Recent Labs    11/25/2020 2101  TSH 1.370   Anemia Panel: Recent Labs    12/04/2020 0215  VITAMINB12 923*  FOLATE 34.0  FERRITIN 22*  TIBC 363  IRON 35*  RETICCTPCT 1.8   Urine analysis:    Component Value Date/Time   COLORURINE YELLOW 08/06/2019 [place] 08/06/2019 0718   LABSPEC 1.020 08/06/2019 0718   PHURINE 6.0 08/06/2019 0718   GLUCOSEU NEGATIVE 08/06/2019 0718   HGBUR SMALL (A) 08/06/2019 0718   HGBUR trace-lysed 12/21/2008 0751   BILIRUBINUR NEGATIVE 08/06/2019 0718   BILIRUBINUR n 01/17/2013 [place] 08/06/2019 0718   PROTEINUR 100 (A) 08/26/2015 1750   UROBILINOGEN 0.2 08/06/2019 0718   NITRITE NEGATIVE 08/06/2019 0718   LEUKOCYTESUR NEGATIVE 08/06/2019 0718   Sepsis Labs: @LABRCNTIP (procalcitonin:4,lacticidven:4)  ) Recent Results (from the past 240 hour(s))  Resp Panel by RT-PCR (Flu A&B, Covid) Nasopharyngeal Swab     Status: None   Collection Time: 12/01/2020  6:11 PM   Specimen: Nasopharyngeal Swab; Nasopharyngeal(NP) swabs in vial transport medium  Result Value Ref Range Status   SARS Coronavirus 2 by RT PCR NEGATIVE NEGATIVE Final    Comment: (NOTE) SARS-CoV-2 target nucleic acids are NOT DETECTED.  The SARS-CoV-2 RNA is generally detectable in upper respiratory specimens during the acute phase of infection. The lowest concentration of SARS-CoV-2 viral copies this assay can detect is 138 copies/mL. A negative result does not preclude  SARS-Cov-2 infection and should not be used as the sole basis for treatment or other patient management decisions. A negative result may occur with  improper specimen collection/handling, submission of specimen other than nasopharyngeal swab, presence of viral mutation(s) within the areas targeted by this assay, and inadequate number of viral copies(<138 copies/mL). A negative result must be combined with clinical observations, patient history, and epidemiological information. The expected result is Negative.  Fact Sheet for Patients:  EntrepreneurPulse.com.au  Fact Sheet for Healthcare Providers:  IncredibleEmployment.be  This test is no t yet approved or cleared by the [place] FDA and  has been authorized for detection and/or diagnosis of SARS-CoV-2 by FDA under  an Emergency Use Authorization (EUA). This EUA will remain  in effect (meaning this test can be used) for the duration of the COVID-19 declaration under Section 564(b)(1) of the Act, 21 U.S.C.section 360bbb-3(b)(1), unless the authorization is terminated  or revoked sooner.       Influenza A by PCR NEGATIVE NEGATIVE Final   Influenza B by PCR NEGATIVE NEGATIVE Final    Comment: (NOTE) The Xpert Xpress SARS-CoV-2/FLU/RSV plus assay is intended as an aid in the diagnosis of influenza from Nasopharyngeal swab specimens and should not be used as a sole basis for treatment. Nasal washings and aspirates are unacceptable for Xpert Xpress SARS-CoV-2/FLU/RSV testing.  Fact Sheet for Patients: EntrepreneurPulse.com.au  Fact Sheet for Healthcare Providers: IncredibleEmployment.be  This test is not yet approved or cleared by the [place] FDA and has been authorized for detection and/or diagnosis of SARS-CoV-2 by FDA under an Emergency Use Authorization (EUA). This EUA will remain in effect (meaning this test can be used) for the duration of  the COVID-19 declaration under Section 564(b)(1) of the Act, 21 U.S.C. section 360bbb-3(b)(1), unless the authorization is terminated or revoked.  Performed at [facility], [place] [address], [place], [place] [address]          Radiology Studies: DG Chest 1 View  Result Date: 11/14/2020 CLINICAL DATA:  cp EXAM: CHEST  1 VIEW COMPARISON:  08/26/2015 and prior. FINDINGS: No pneumothorax. Small bilateral pleural effusions and patchy basilar opacities. Cardiomegaly and central pulmonary vascular congestion. Multilevel spondylosis. IMPRESSION: Cardiomegaly and central pulmonary vascular congestion. Bibasilar opacities and small pleural effusions, edema versus infection. Electronically Signed   By: Primitivo Gauze M.D.   On: 11/19/2020 16:46   CARDIAC CATHETERIZATION  Result Date: 12/02/2020  Ost LAD to Prox LAD lesion is 30% stenosed.  There is moderate left ventricular systolic dysfunction.  LV end diastolic pressure is moderately elevated.  The left ventricular ejection fraction is 25-35% by visual estimate.  There is no aortic valve stenosis.  Nonobstructive CAD.  Systolic heart failure with elevated LVEDP.  Additional Lasix given. ? Tachycardia induced cardiomyopathy?        Scheduled Meds: . [MAR Hold] acetaminophen  1,000 mg Oral TID  . [MAR Hold] aspirin EC  81 mg Oral QHS  . [MAR Hold] donepezil  5 mg Oral q AM  . [MAR Hold] ferrous sulfate  325 mg Oral QODAY  . [MAR Hold] furosemide  40 mg Intravenous BID  . [MAR Hold] insulin aspart  0-5 Units Subcutaneous QHS  . [MAR Hold] insulin aspart  0-9 Units Subcutaneous TID WC  . [MAR Hold] insulin detemir  15 Units Subcutaneous QHS  . [MAR Hold] metoprolol succinate  12.5 mg Oral Daily  . [MAR Hold] pantoprazole  40 mg Oral Daily  . [MAR Hold] pravastatin  40 mg Oral QPM  . [MAR Hold] senna-docusate  1 tablet Oral BID  . [MAR Hold] sodium chloride flush  3 mL Intravenous Q12H   Continuous Infusions: . sodium  chloride    . sodium chloride    . diltiazem (CARDIZEM) infusion 15 mg/hr (11/17/2020 7341)  . heparin Stopped (12/04/2020 1002)     LOS: 1 day    Time spent: 10min  Domenic Polite, MD [facility]  12/09/2020, 11:03 AM

## 2020-11-30 NOTE — Progress Notes (Signed)
[place] for Heparin Indication: atrial fibrillation  Allergies  Allergen Reactions  . Ciprofloxacin Other (See Comments)    "made me have a weird feeling- felt disoriented.."  . Sulfamethoxazole Rash    Patient Measurements: Height: 5\' 10"  (177.8 cm) Weight: 82.6 kg (182 lb 1.6 oz) IBW/kg (Calculated) : 73 Heparin Dosing Weight: 82.6 kg   Vital Signs: Temp: 98.5 F (36.9 C) (12/21 0238) Temp Source: Oral (12/21 0238) BP: 100/65 (12/21 0238) Pulse Rate: 87 (12/21 0238)  Labs: Recent Labs    11/11/2020 1620 11/21/2020 1810 11/19/2020 2101 11/21/2020 2326 11/19/2020 0215  HGB 11.0*  --   --  10.6* 10.3*  HCT 36.9*  --   --  35.2* 33.9*  PLT 207  --   --  193 189  LABPROT  --   --   --   --  14.4  INR  --   --   --   --  1.2  HEPARINUNFRC  --   --   --   --  <0.10*  CREATININE 1.10  --   --   --   --   TROPONINIHS 809* 795* 970* 966*  --     Estimated Creatinine Clearance: 50.7 mL/min (by C-G formula based on SCr of 1.1 mg/dL).   Medical History: Past Medical History:  Diagnosis Date  . Anemia   . Atherosclerotic PVD with intermittent claudication   . Chronic diastolic congestive heart failure 11/23/2014  . DDD (degenerative disc disease), cervical 08/02/2017  . Diastolic dysfunction    a. 10/2020 Echo: EF 55-60%, no rwma, Gr2 DD, RVSP ~ 33.19mmHg. Mod dil LA, mildly dil RA. Mild MR. Mod AS.  Marland Kitchen Diverticulosis   . DVT (deep venous thrombosis)   . Edema 08/19/2014  . GERD (gastroesophageal reflux disease)    hx hiatal hernia  . History of pneumonia 2002  . History of skin cancer 07/30/2019   Follows with skin surgery center  . Hyperlipidemia associated with type 2 diabetes mellitus 05/03/2017  . Hypertension associated with type 2 diabetes mellitus 08/20/2007  . IBS (irritable bowel syndrome)   . Mild neurocognitive disorder 07/07/2020  . Moderate aortic stenosis    a. 10/2020 Echo: Mod AS.  . Multiple lacunar infarcts    Two  in the right basal ganglia occurring between 2016 and 2021  . Non-obstructive CAD (coronary artery disease)    a. 08/2014 Cath: LM 30, LAD 40-50, LCX nl, RCA nl. PCWP 4, PA 19/4; b. 01/2018 MV: EF 55-65%, no ischemia/infarct.  Marland Kitchen OAB (overactive bladder) 03/26/2015   History of stress incontinence  . Osteoarthritis   . Peripheral neuropathy associated with type 2 diabetes mellitus 05/30/2010  . PVC's (premature ventricular contractions)    a. 09/2020 Zio: sinus brady/RSR, PACs, brief PAT, PVCs, 4 beats NSVT.  Marland Kitchen Shingles   . Type 2 diabetes mellitus with long-term current use of insulin 05/03/2017  . Varicose veins of lower extremities with other complications [id number]      Assessment: 87 YOM with new onset Afib with RVR. Pharmacy consulted to start IV heparin. Of note, he is not on any anticoagulation prior to admit.   H/H mildly low, Plt wnl. SCr wnl.   12/21 AM update:  Heparin level low  No issues per RN  Goal of Therapy:  Heparin level 0.3-0.7 units/ml Monitor platelets by anticoagulation protocol: Yes   Plan:  -Heparin 2000 units re-bolus, inc heparin to 1350 units/hr -1200 heparin level -F/u plans  for transition to oral anticoag   Narda Bonds, PharmD, BCPS Clinical Pharmacist Phone: [phone]

## 2020-11-30 NOTE — Progress Notes (Signed)
[place] for Heparin Indication: atrial fibrillation  Allergies  Allergen Reactions  . Ciprofloxacin Other (See Comments)    "made me have a weird feeling- felt disoriented.."  . Sulfamethoxazole Rash    Patient Measurements: Height: 5\' 10"  (177.8 cm) Weight: 85.9 kg (189 lb 6 oz) IBW/kg (Calculated) : 73 Heparin Dosing Weight: 82.6 kg   Vital Signs: Temp: 97.5 F (36.4 C) (12/21 1119) Temp Source: Oral (12/21 1119) BP: 132/78 (12/21 1119) Pulse Rate: 70 (12/21 1119)  Labs: Recent Labs    12/07/2020 1620 12/09/2020 1810 11/11/2020 2101 12/02/2020 2326 12/22/20 0215  HGB 11.0*  --   --  10.6* 10.3*  HCT 36.9*  --   --  35.2* 33.9*  PLT 207  --   --  193 189  LABPROT  --   --   --   --  14.4  INR  --   --   --   --  1.2  HEPARINUNFRC  --   --   --   --  <0.10*  CREATININE 1.10  --   --   --   --   TROPONINIHS 809* 795* 970* 966*  --     Estimated Creatinine Clearance: 50.7 mL/min (by C-G formula based on SCr of 1.1 mg/dL).   Medical History: Past Medical History:  Diagnosis Date  . Anemia   . Atherosclerotic PVD with intermittent claudication   . Chronic diastolic congestive heart failure 11/23/2014  . DDD (degenerative disc disease), cervical 08/02/2017  . Diastolic dysfunction    a. 10/2020 Echo: EF 55-60%, no rwma, Gr2 DD, RVSP ~ 33.7mmHg. Mod dil LA, mildly dil RA. Mild MR. Mod AS.  Marland Kitchen Diverticulosis   . DVT (deep venous thrombosis)   . Edema 08/19/2014  . GERD (gastroesophageal reflux disease)    hx hiatal hernia  . History of pneumonia 2002  . History of skin cancer 07/30/2019   Follows with skin surgery center  . Hyperlipidemia associated with type 2 diabetes mellitus 05/03/2017  . Hypertension associated with type 2 diabetes mellitus 08/20/2007  . IBS (irritable bowel syndrome)   . Mild neurocognitive disorder 07/07/2020  . Moderate aortic stenosis    a. 10/2020 Echo: Mod AS.  . Multiple lacunar infarcts    Two in  the right basal ganglia occurring between 2016 and 2021  . Non-obstructive CAD (coronary artery disease)    a. 08/2014 Cath: LM 30, LAD 40-50, LCX nl, RCA nl. PCWP 4, PA 19/4; b. 01/2018 MV: EF 55-65%, no ischemia/infarct.  Marland Kitchen OAB (overactive bladder) 03/26/2015   History of stress incontinence  . Osteoarthritis   . Peripheral neuropathy associated with type 2 diabetes mellitus 05/30/2010  . PVC's (premature ventricular contractions)    a. 09/2020 Zio: sinus brady/RSR, PACs, brief PAT, PVCs, 4 beats NSVT.  Marland Kitchen Shingles   . Type 2 diabetes mellitus with long-term current use of insulin 05/03/2017  . Varicose veins of lower extremities with other complications 48/54/6270      Assessment: 87 YOM with new onset Afib with RVR. Pharmacy consulted to start IV heparin. Of note, he is not on any anticoagulation prior to admit.   H/H mildly low, Plt wnl. SCr wnl.   12/21 AM update:  Heparin level low  No issues per RN  Goal of Therapy:  Heparin level 0.3-0.7 units/ml Monitor platelets by anticoagulation protocol: Yes   Plan:   Erin Hearing PharmD., BCPS Clinical Pharmacist 12-22-2020 11:32 AM

## 2020-12-01 ENCOUNTER — Inpatient Hospital Stay (HOSPITAL_COMMUNITY): Payer: Medicare PPO

## 2020-12-01 ENCOUNTER — Inpatient Hospital Stay (HOSPITAL_COMMUNITY): Payer: Medicare PPO | Admitting: Registered Nurse

## 2020-12-01 DIAGNOSIS — I469 Cardiac arrest, cause unspecified: Secondary | ICD-10-CM

## 2020-12-01 DIAGNOSIS — I2583 Coronary atherosclerosis due to lipid rich plaque: Secondary | ICD-10-CM

## 2020-12-01 DIAGNOSIS — I2510 Atherosclerotic heart disease of native coronary artery without angina pectoris: Secondary | ICD-10-CM

## 2020-12-01 DIAGNOSIS — I5082 Biventricular heart failure: Secondary | ICD-10-CM

## 2020-12-01 DIAGNOSIS — I4891 Unspecified atrial fibrillation: Secondary | ICD-10-CM | POA: Diagnosis not present

## 2020-12-01 DIAGNOSIS — I5043 Acute on chronic combined systolic (congestive) and diastolic (congestive) heart failure: Secondary | ICD-10-CM

## 2020-12-01 DIAGNOSIS — J9602 Acute respiratory failure with hypercapnia: Secondary | ICD-10-CM

## 2020-12-01 DIAGNOSIS — I5021 Acute systolic (congestive) heart failure: Secondary | ICD-10-CM

## 2020-12-01 DIAGNOSIS — R570 Cardiogenic shock: Secondary | ICD-10-CM

## 2020-12-01 DIAGNOSIS — I472 Ventricular tachycardia: Secondary | ICD-10-CM

## 2020-12-01 LAB — CBC
HCT: 36.9 % — ABNORMAL LOW (ref 39.0–52.0)
Hemoglobin: 11.6 g/dL — ABNORMAL LOW (ref 13.0–17.0)
MCH: 31.1 pg (ref 26.0–34.0)
MCHC: 31.4 g/dL (ref 30.0–36.0)
MCV: 98.9 fL (ref 80.0–100.0)
Platelets: 239 10*3/uL (ref 150–400)
RBC: 3.73 MIL/uL — ABNORMAL LOW (ref 4.22–5.81)
RDW: 14.8 % (ref 11.5–15.5)
WBC: 12.7 10*3/uL — ABNORMAL HIGH (ref 4.0–10.5)
nRBC: 0.3 % — ABNORMAL HIGH (ref 0.0–0.2)

## 2020-12-01 LAB — POCT I-STAT 7, (LYTES, BLD GAS, ICA,H+H)
Acid-Base Excess: 3 mmol/L — ABNORMAL HIGH (ref 0.0–2.0)
Acid-Base Excess: 4 mmol/L — ABNORMAL HIGH (ref 0.0–2.0)
Acid-base deficit: 7 mmol/L — ABNORMAL HIGH (ref 0.0–2.0)
Bicarbonate: 19.8 mmol/L — ABNORMAL LOW (ref 20.0–28.0)
Bicarbonate: 22.5 mmol/L (ref 20.0–28.0)
Bicarbonate: 25.2 mmol/L (ref 20.0–28.0)
Calcium, Ion: 1.16 mmol/L (ref 1.15–1.40)
Calcium, Ion: 1.11 mmol/L — ABNORMAL LOW (ref 1.15–1.40)
Calcium, Ion: 1.16 mmol/L (ref 1.15–1.40)
HCT: 34 % — ABNORMAL LOW (ref 39.0–52.0)
HCT: 32 % — ABNORMAL LOW (ref 39.0–52.0)
HCT: 33 % — ABNORMAL LOW (ref 39.0–52.0)
Hemoglobin: 11.6 g/dL — ABNORMAL LOW (ref 13.0–17.0)
Hemoglobin: 10.9 g/dL — ABNORMAL LOW (ref 13.0–17.0)
Hemoglobin: 11.2 g/dL — ABNORMAL LOW (ref 13.0–17.0)
O2 Saturation: 94 %
O2 Saturation: 100 %
O2 Saturation: 99 %
Patient temperature: 97.5
Patient temperature: 99
Patient temperature: 99
Potassium: 4.8 mmol/L (ref 3.5–5.1)
Potassium: 3.5 mmol/L (ref 3.5–5.1)
Potassium: 3.6 mmol/L (ref 3.5–5.1)
Sodium: 139 mmol/L (ref 135–145)
Sodium: 139 mmol/L (ref 135–145)
Sodium: 143 mmol/L (ref 135–145)
TCO2: 21 mmol/L — ABNORMAL LOW (ref 22–32)
TCO2: 23 mmol/L (ref 22–32)
TCO2: 26 mmol/L (ref 22–32)
pCO2 arterial: 42.9 mmHg (ref 32.0–48.0)
pCO2 arterial: 21 mmHg — ABNORMAL LOW (ref 32.0–48.0)
pCO2 arterial: 28.2 mmHg — ABNORMAL LOW (ref 32.0–48.0)
pH, Arterial: 7.269 — ABNORMAL LOW (ref 7.350–7.450)
pH, Arterial: 7.639 (ref 7.350–7.450)
pH, Arterial: 7.559 — ABNORMAL HIGH (ref 7.350–7.450)
pO2, Arterial: 77 mmHg — ABNORMAL LOW (ref 83.0–108.0)
pO2, Arterial: 172 mmHg — ABNORMAL HIGH (ref 83.0–108.0)
pO2, Arterial: 119 mmHg — ABNORMAL HIGH (ref 83.0–108.0)

## 2020-12-01 LAB — COOXEMETRY PANEL
Carboxyhemoglobin: 0.9 % (ref 0.5–1.5)
Methemoglobin: 1.1 % (ref 0.0–1.5)
O2 Saturation: 70.6 %
Total hemoglobin: 11.3 g/dL — ABNORMAL LOW (ref 12.0–16.0)

## 2020-12-01 LAB — MRSA PCR SCREENING: MRSA by PCR: POSITIVE — AB

## 2020-12-01 LAB — GLUCOSE, CAPILLARY
Glucose-Capillary: 167 mg/dL — ABNORMAL HIGH (ref 70–99)
Glucose-Capillary: 266 mg/dL — ABNORMAL HIGH (ref 70–99)
Glucose-Capillary: 260 mg/dL — ABNORMAL HIGH (ref 70–99)
Glucose-Capillary: 237 mg/dL — ABNORMAL HIGH (ref 70–99)
Glucose-Capillary: 150 mg/dL — ABNORMAL HIGH (ref 70–99)
Glucose-Capillary: 118 mg/dL — ABNORMAL HIGH (ref 70–99)

## 2020-12-01 LAB — COMPREHENSIVE METABOLIC PANEL
ALT: 23 U/L (ref 0–44)
AST: 29 U/L (ref 15–41)
Albumin: 3.7 g/dL (ref 3.5–5.0)
Alkaline Phosphatase: 40 U/L (ref 38–126)
Anion gap: 13 (ref 5–15)
BUN: 36 mg/dL — ABNORMAL HIGH (ref 8–23)
CO2: 22 mmol/L (ref 22–32)
Calcium: 9.4 mg/dL (ref 8.9–10.3)
Chloride: 105 mmol/L (ref 98–111)
Creatinine, Ser: 1.48 mg/dL — ABNORMAL HIGH (ref 0.61–1.24)
GFR, Estimated: 46 mL/min — ABNORMAL LOW (ref >60)
Glucose, Bld: 217 mg/dL — ABNORMAL HIGH (ref 70–99)
Potassium: 5.5 mmol/L — ABNORMAL HIGH (ref 3.5–5.1)
Sodium: 140 mmol/L (ref 135–145)
Total Bilirubin: 1 mg/dL (ref 0.3–1.2)
Total Protein: 6.5 g/dL (ref 6.5–8.1)

## 2020-12-01 LAB — ECHOCARDIOGRAM LIMITED
AV Mean grad: 9 mmHg
AV Peak grad: 16.2 mmHg
Ao pk vel: 2.01 m/s
Calc EF: 22.9 %
Height: 70 in
S' Lateral: 4.5 cm
Single Plane A2C EF: 19.8 %
Single Plane A4C EF: 19 %
Weight: 3030 oz

## 2020-12-01 LAB — APTT: aPTT: 59 seconds — ABNORMAL HIGH (ref 24–36)

## 2020-12-01 MED ORDER — POLYETHYLENE GLYCOL 3350 17 G PO PACK
17.0000 g | PACK | Freq: Every day | ORAL | Status: DC
  Administered 2020-12-01 – 2020-12-04 (×3): 17 g
  Filled 2020-12-01 (×4): qty 1

## 2020-12-01 MED ORDER — FENTANYL BOLUS VIA INFUSION
25.0000 ug | INTRAVENOUS | Status: DC | PRN
  Administered 2020-12-01 – 2020-12-04 (×8): 25 ug via INTRAVENOUS
  Filled 2020-12-01: qty 25

## 2020-12-01 MED ORDER — CHLORHEXIDINE GLUCONATE 0.12% ORAL RINSE (MEDLINE KIT)
15.0000 mL | Freq: Two times a day (BID) | OROMUCOSAL | Status: DC
  Administered 2020-12-01 – 2020-12-05 (×8): 15 mL via OROMUCOSAL

## 2020-12-01 MED ORDER — ORAL CARE MOUTH RINSE
15.0000 mL | OROMUCOSAL | Status: DC
  Administered 2020-12-01 – 2020-12-05 (×34): 15 mL via OROMUCOSAL

## 2020-12-01 MED ORDER — SODIUM ZIRCONIUM CYCLOSILICATE 5 G PO PACK
5.0000 g | PACK | Freq: Once | ORAL | Status: AC
  Administered 2020-12-01: 12:00:00 5 g via ORAL
  Filled 2020-12-01 (×2): qty 1

## 2020-12-01 MED ORDER — DOCUSATE SODIUM 50 MG/5ML PO LIQD
100.0000 mg | Freq: Two times a day (BID) | ORAL | Status: DC
  Administered 2020-12-01 – 2020-12-04 (×6): 100 mg
  Filled 2020-12-01 (×7): qty 10

## 2020-12-01 MED ORDER — PROPOFOL 10 MG/ML IV BOLUS
INTRAVENOUS | Status: DC | PRN
  Administered 2020-12-01: 30 mg via INTRAVENOUS

## 2020-12-01 MED ORDER — SENNOSIDES-DOCUSATE SODIUM 8.6-50 MG PO TABS
1.0000 | ORAL_TABLET | Freq: Two times a day (BID) | ORAL | Status: DC
  Administered 2020-12-01 – 2020-12-04 (×6): 1
  Filled 2020-12-01 (×7): qty 1

## 2020-12-01 MED ORDER — FENTANYL BOLUS VIA INFUSION: 10.0000 ug | INTRAVENOUS | Status: DC | PRN

## 2020-12-01 MED ORDER — HEPARIN SOD (PORK) LOCK FLUSH 100 UNIT/ML IV SOLN
500.0000 [IU] | Freq: Once | INTRAVENOUS | Status: DC
  Filled 2020-12-01: qty 5

## 2020-12-01 MED ORDER — MIDAZOLAM HCL 2 MG/2ML IJ SOLN: 2.0000 mg | Freq: Once | INTRAMUSCULAR | Status: AC

## 2020-12-01 MED ORDER — DOBUTAMINE IN D5W 4-5 MG/ML-% IV SOLN
2.5000 ug/kg/min | INTRAVENOUS | Status: DC
  Administered 2020-12-01: 5 ug/kg/min via INTRAVENOUS
  Filled 2020-12-01: qty 250

## 2020-12-01 MED ORDER — FENTANYL 2500MCG IN NS 250ML (10MCG/ML) PREMIX INFUSION: 0.0000 ug/h | INTRAVENOUS | Status: DC

## 2020-12-01 MED ORDER — METOPROLOL TARTRATE 25 MG PO TABS
25.0000 mg | ORAL_TABLET | Freq: Three times a day (TID) | ORAL | Status: DC
  Administered 2020-12-01: 12:00:00 25 mg
  Filled 2020-12-01: qty 1

## 2020-12-01 MED ORDER — MIDAZOLAM HCL 2 MG/2ML IJ SOLN
2.0000 mg | Freq: Once | INTRAMUSCULAR | Status: AC
  Administered 2020-12-01: 20:00:00 2 mg via INTRAVENOUS
  Filled 2020-12-01: qty 2

## 2020-12-01 MED ORDER — DEXMEDETOMIDINE HCL IN NACL 400 MCG/100ML IV SOLN: 0.4000 ug/kg/h | INTRAVENOUS | Status: DC

## 2020-12-01 MED ORDER — FUROSEMIDE 10 MG/ML IJ SOLN
80.0000 mg | Freq: Once | INTRAMUSCULAR | Status: AC
  Administered 2020-12-01: 12:00:00 80 mg via INTRAVENOUS
  Filled 2020-12-01: qty 8

## 2020-12-01 MED ORDER — AMIODARONE HCL IN DEXTROSE 360-4.14 MG/200ML-% IV SOLN
60.0000 mg/h | INTRAVENOUS | Status: AC
  Administered 2020-12-01: 12:00:00 60 mg/h via INTRAVENOUS
  Filled 2020-12-01: qty 200

## 2020-12-01 MED ORDER — MIDAZOLAM HCL 2 MG/2ML IJ SOLN
INTRAMUSCULAR | Status: AC
  Administered 2020-12-01: 18:00:00 2 mg
  Filled 2020-12-01: qty 2

## 2020-12-01 MED ORDER — ACETAMINOPHEN 325 MG PO TABS
650.0000 mg | ORAL_TABLET | Freq: Four times a day (QID) | ORAL | Status: DC | PRN
  Administered 2020-12-01 – 2020-12-03 (×2): 650 mg via ORAL
  Filled 2020-12-01 (×2): qty 2

## 2020-12-01 MED ORDER — SUCCINYLCHOLINE CHLORIDE 20 MG/ML IJ SOLN
INTRAMUSCULAR | Status: DC | PRN
  Administered 2020-12-01: 200 mg via INTRAVENOUS

## 2020-12-01 MED ORDER — FENTANYL CITRATE (PF) 100 MCG/2ML IJ SOLN: 25.0000 ug | Freq: Once | INTRAMUSCULAR | Status: DC

## 2020-12-01 MED ORDER — FERROUS SULFATE 220 (44 FE) MG/5ML PO ELIX
220.0000 mg | ORAL_SOLUTION | ORAL | Status: DC
  Administered 2020-12-01 – 2020-12-03 (×2): 220 mg
  Filled 2020-12-01 (×3): qty 5

## 2020-12-01 MED ORDER — MUPIROCIN 2 % EX OINT
1.0000 "application " | TOPICAL_OINTMENT | Freq: Two times a day (BID) | CUTANEOUS | Status: DC
  Administered 2020-12-01 – 2020-12-04 (×8): 1 via NASAL
  Filled 2020-12-01 (×2): qty 22

## 2020-12-01 MED ORDER — DEXMEDETOMIDINE HCL IN NACL 400 MCG/100ML IV SOLN
0.0000 ug/kg/h | INTRAVENOUS | Status: DC
  Administered 2020-12-01: 14:00:00 0.8 ug/kg/h via INTRAVENOUS
  Administered 2020-12-01: 10:00:00 0.6 ug/kg/h via INTRAVENOUS
  Administered 2020-12-01 – 2020-12-02 (×5): 1.2 ug/kg/h via INTRAVENOUS
  Administered 2020-12-02: 13:00:00 1 ug/kg/h via INTRAVENOUS
  Filled 2020-12-01 (×8): qty 100

## 2020-12-01 MED ORDER — AMIODARONE HCL IN DEXTROSE 360-4.14 MG/200ML-% IV SOLN
60.0000 mg/h | INTRAVENOUS | Status: DC
  Administered 2020-12-01 – 2020-12-02 (×4): 30 mg/h via INTRAVENOUS
  Administered 2020-12-03: 12:00:00 60 mg/h via INTRAVENOUS
  Administered 2020-12-03: 06:00:00 30 mg/h via INTRAVENOUS
  Administered 2020-12-03 – 2020-12-05 (×8): 60 mg/h via INTRAVENOUS
  Filled 2020-12-01 (×13): qty 200

## 2020-12-01 MED ORDER — NOREPINEPHRINE 4 MG/250ML-% IV SOLN
0.0000 ug/min | INTRAVENOUS | Status: DC
  Administered 2020-12-01: 10:00:00 8 ug/min via INTRAVENOUS

## 2020-12-01 MED ORDER — HEPARIN (PORCINE) 25000 UT/250ML-% IV SOLN
1200.0000 [IU]/h | INTRAVENOUS | Status: DC
  Administered 2020-12-01: 12:00:00 1150 [IU]/h via INTRAVENOUS
  Administered 2020-12-02 – 2020-12-03 (×2): 1300 [IU]/h via INTRAVENOUS
  Administered 2020-12-03 – 2020-12-04 (×2): 1100 [IU]/h via INTRAVENOUS
  Administered 2020-12-05: 06:00:00 1200 [IU]/h via INTRAVENOUS
  Filled 2020-12-01 (×7): qty 250

## 2020-12-01 MED ORDER — PANTOPRAZOLE SODIUM 40 MG PO PACK
40.0000 mg | PACK | Freq: Every day | ORAL | Status: DC
  Administered 2020-12-01 – 2020-12-04 (×3): 40 mg
  Filled 2020-12-01 (×5): qty 20

## 2020-12-01 MED ORDER — AMIODARONE HCL IN DEXTROSE 360-4.14 MG/200ML-% IV SOLN
INTRAVENOUS | Status: AC
  Filled 2020-12-01: qty 200

## 2020-12-01 MED ORDER — PRAVASTATIN SODIUM 40 MG PO TABS
40.0000 mg | ORAL_TABLET | Freq: Every evening | ORAL | Status: DC
  Administered 2020-12-01 – 2020-12-04 (×4): 40 mg
  Filled 2020-12-01 (×5): qty 1

## 2020-12-01 MED ORDER — SODIUM CHLORIDE 0.9 % IV SOLN
250.0000 mL | INTRAVENOUS | Status: DC
  Administered 2020-12-01 – 2020-12-02 (×2): 250 mL via INTRAVENOUS

## 2020-12-01 MED ORDER — CHLORHEXIDINE GLUCONATE CLOTH 2 % EX PADS
6.0000 | MEDICATED_PAD | Freq: Every day | CUTANEOUS | Status: DC
  Administered 2020-12-01 – 2020-12-05 (×4): 6 via TOPICAL

## 2020-12-01 MED ORDER — FENTANYL 2500MCG IN NS 250ML (10MCG/ML) PREMIX INFUSION
25.0000 ug/h | INTRAVENOUS | Status: DC
  Administered 2020-12-01: 10:00:00 50 ug/h via INTRAVENOUS
  Administered 2020-12-02 (×2): 200 ug/h via INTRAVENOUS
  Administered 2020-12-03: 09:00:00 150 ug/h via INTRAVENOUS
  Administered 2020-12-04: 14:00:00 100 ug/h via INTRAVENOUS
  Filled 2020-12-01 (×5): qty 250

## 2020-12-01 MED ORDER — FUROSEMIDE 10 MG/ML IJ SOLN
60.0000 mg | Freq: Two times a day (BID) | INTRAMUSCULAR | Status: DC
  Administered 2020-12-01 – 2020-12-04 (×6): 60 mg via INTRAVENOUS
  Filled 2020-12-01 (×7): qty 6

## 2020-12-01 MED ORDER — ASPIRIN 81 MG PO CHEW
81.0000 mg | CHEWABLE_TABLET | Freq: Every day | ORAL | Status: DC
  Administered 2020-12-01 – 2020-12-04 (×4): 81 mg
  Filled 2020-12-01 (×4): qty 1

## 2020-12-01 MED ORDER — INSULIN DETEMIR 100 UNIT/ML ~~LOC~~ SOLN
10.0000 [IU] | Freq: Two times a day (BID) | SUBCUTANEOUS | Status: DC
  Administered 2020-12-01 – 2020-12-03 (×4): 10 [IU] via SUBCUTANEOUS
  Filled 2020-12-01 (×10): qty 0.1

## 2020-12-01 MED ORDER — ACETAMINOPHEN 500 MG PO TABS
1000.0000 mg | ORAL_TABLET | Freq: Three times a day (TID) | ORAL | Status: DC
  Administered 2020-12-01: 12:00:00 1000 mg
  Filled 2020-12-01: qty 2

## 2020-12-01 MED FILL — Heparin Sod (Porcine)-NaCl IV Soln 1000 Unit/500ML-0.9%: INTRAVENOUS | Qty: 1000 | Status: AC

## 2020-12-01 NOTE — Progress Notes (Signed)
ABG x2 obtained.  2040 ABG  7.64;21;172;23;100% Vent settings changed to PRVC 22/500/+8/50 Rate and tidal volume per MD  2051 abg 7.56;28;119; 25 at a minute ventilation of 10.9L/min Vent settings changed to PRVC 15/500/+8/40%.  This will set pt's minute ventilation to approximately 7.5L/min.

## 2020-12-01 NOTE — Progress Notes (Signed)
[place] for Heparin Indication: atrial fibrillation  Allergies  Allergen Reactions  . Ciprofloxacin Other (See Comments)    "made me have a weird feeling- felt disoriented.."  . Sulfamethoxazole Rash    Patient Measurements: Height: 5\' 10"  (177.8 cm) Weight: 85.9 kg (189 lb 6 oz) IBW/kg (Calculated) : 73 Heparin Dosing Weight: 82.6 kg   Vital Signs: Temp: 97.5 F (36.4 C) (12/22 0746) Temp Source: Oral (12/22 0746) BP: 105/64 (12/22 0900) Pulse Rate: 130 (12/22 0746)  Labs: Recent Labs    11/21/2020 1620 11/12/2020 1810 12/04/2020 2326 12/10/2020 0215 2020/12/10 0740 2020-12-10 1052 Dec 10, 2020 1151 12/01/20 0249  HGB 11.0*  --  10.6* 10.3*  --  11.6*  --  11.6*  HCT 36.9*  --  35.2* 33.9*  --  34.0*  --  36.9*  PLT 207  --  193 189  --   --   --  239  LABPROT  --   --   --  14.4  --   --   --   --   INR  --   --   --  1.2  --   --   --   --   HEPARINUNFRC  --   --   --  <0.10*  --   --   --   --   CREATININE 1.10  --   --   --   --   --   --  1.48*  TROPONINIHS 809*   < > 966*  --  1,014*  --  945*  --    < > = values in this interval not displayed.    Estimated Creatinine Clearance: 37.7 mL/min (A) (by C-G formula based on SCr of 1.48 mg/dL (H)).  Assessment: 89 YOM with new onset Afib with RVR - was started on apixaban last night.  Now s/p cardiac arrest  To return to IV hep  CBC stable  Goal of Therapy:  Heparin level 0.3-0.7 units/ml Monitor platelets by anticoagulation protocol: Yes   Plan:  Dc apixaban Add heparin - no bolus Heparin gtt 1150 units/hr Initial hep aPTT 1800 Daily hep lvl aptt cbc  Barth Kirks, PharmD, BCPS, BCCCP Clinical Pharmacist [phone]  Please check AMION for all [place] numbers  12/01/2020 10:01 AM

## 2020-12-01 NOTE — Progress Notes (Signed)
  Echocardiogram 2D Echocardiogram Limited has been performed.  [name] M 12/01/2020, 9:40 AM

## 2020-12-01 NOTE — Progress Notes (Signed)
eLink Physician-Brief Progress Note Patient Name: [name] DOB: [birth year] MRN: [id number]   Date of Service  12/01/2020  HPI/Events of Note  Camera: (RN states pt. is anxious, agitated.  On vent, no drip sedation ordered.  Has received multiple fentanyl & versed pushes - not working.  Requesting order for sedation gtt)  discussed with bed side RN. On fenta 200/precedex at 1. HR > 110. MAP good. Post RT suction: agitation.  CxR: and ABg reviewed.  580/08/09/69% ( 5 10 inch only).   eICU Interventions  - Versed 2 mg IV stat - get ABG, wean Vt as tolerated. If not better on agitation, need Versed gtt.       Intervention Category Major Interventions: Delirium, psychosis, severe agitation - evaluation and management  Elmer Sow 12/01/2020, 8:17 PM

## 2020-12-01 NOTE — Progress Notes (Signed)
[place] for Heparin Indication: atrial fibrillation  Allergies  Allergen Reactions  . Ciprofloxacin Other (See Comments)    "made me have a weird feeling- felt disoriented.."  . Sulfamethoxazole Rash    Patient Measurements: Height: 5\' 10"  (177.8 cm) Weight: 85.9 kg (189 lb 6 oz) IBW/kg (Calculated) : 73 Heparin Dosing Weight: 82.6 kg   Vital Signs: Temp: 99 F (37.2 C) (12/22 1946) Temp Source: Axillary (12/22 1946) BP: 123/89 (12/22 1830) Pulse Rate: 119 (12/22 1830)  Labs: Recent Labs    12/09/2020 1620 11/11/2020 1810 December 05, 2020 2326 11/24/2020 0215 12/02/2020 0740 11/26/2020 1052 11/16/2020 1151 12/01/20 0249 12/01/20 0958 12/01/20 1954  HGB 11.0*  --  10.6* 10.3*  --  11.6*  --  11.6* 11.6*  --   HCT 36.9*  --  35.2* 33.9*  --  34.0*  --  36.9* 34.0*  --   PLT 207  --  193 189  --   --   --  239  --   --   APTT  --   --   --   --   --   --   --   --   --  59*  LABPROT  --   --   --  14.4  --   --   --   --   --   --   INR  --   --   --  1.2  --   --   --   --   --   --   HEPARINUNFRC  --   --   --  <0.10*  --   --   --   --   --   --   CREATININE 1.10  --   --   --   --   --   --  1.48*  --   --   TROPONINIHS 809*   < > 966*  --  1,014*  --  945*  --   --   --    < > = values in this interval not displayed.    Estimated Creatinine Clearance: 37.7 mL/min (A) (by C-G formula based on SCr of 1.48 mg/dL (H)).  Assessment: 35 YOM with new onset Afib with RVR - was started on apixaban 11/19/2020 PM.  S/p cardiac arrest and IV heparin resumed.  APTT slightly sub-therapeutic; no issue with heparin infusion nor bleeding per RN.  Goal of Therapy:  Heparin level 0.3-0.7 units/ml Monitor platelets by anticoagulation protocol: Yes   Plan:  Increase heparin gtt to 1300 units/hr F/U AM labs  Walter Grima D. Mina Marble, PharmD, BCPS, [place] 12/01/2020, 8:36 PM

## 2020-12-01 NOTE — Significant Event (Addendum)
Rapid Response Event Note   Reason for Call :  Dizziness/confusion  Initial Focused Assessment:  Pt laying in bed with eyes open, in no distress. He is alert to self and year but disoriented to place and situation. He is able to answer most questions appropriately. He is c/o being dizzy. He says this dizziness happens a lot with him. Pupils 4, equal, and brisk. Skin cool to touch. NIH-1 for sensory>touch feels different in RLE vs LLE.  HR-120(Afib), BP-125/90, RR-22, SpO2-100% on 5L [place].   Interventions:  CBG-167 CT Head STAT  Plan of Care:  Await CT results and relay abnormalities to MD. Bedrest while dizzy. Continue to monitor pt. Call RRT if further assistance needed.    Event Summary:   MD Notified: Dr. Hal Hope notified PTA RRT Call [facility] End Time:0630 Dillard Essex, RN

## 2020-12-01 NOTE — Progress Notes (Signed)
[facility] Progress Note Patient Name: [name] DOB: 20-Sep-1933 MRN: [id number]   Date of Service  12/01/2020  HPI/Events of Note  7.56/28/118/25, 99%.  RT Merrilee Seashore is decreasing RR to 15.   eICU Interventions  Rate decreased by RT to 15. Follow prn ABG.      Intervention Category Intermediate Interventions: Diagnostic test evaluation  Elmer Sow 12/01/2020, 10:00 PM

## 2020-12-01 NOTE — Consult Note (Addendum)
Advanced Heart Failure Team Consult Note   Primary Physician: Caren Macadam, MD PCP-Cardiologist:  Kirk Ruths, MD  Reason for Consultation: Acute biventricular heart failure   HPI:    [name] is seen today for evaluation of acute biventricular heart failure at the request of Dr. Margaretann Loveless, Cardiology.   86 y/o male w/ h/o chronic diastolic heart failure, HTN, HLD, T2DM, h/o PVCs and bradycardia,  CVA, remote DVT, bilateral carpal tunnel syndrome and lumbar spinal stenosis, admitted w/ new onset atrial fibrillation w/ RVR and acute systolic heart failure.      EF now 25-30%. Previously normal 50-55%. RV also moderately reduced.   Significant elevation in Hs Trop peaking at 1,014. Now down-trending, 945.  Admit BNP 975. CXR w/ pulmonary edema.   Initially started on Cardizem gtt and IV heparin. LHC yesterday showed nonobstructive coronary disease. LVEDP severely elevated at 30. After echo showed drop in EF, Cardizem gtt was discontinued and he was placed on Lopressor Q8H.   Diuresis attempted w/ IV Lasix w/ poor urinary response. Only 850 cc out yesterday. Now w/ AKI. SCr 1.10 on admit, 1.48 today.   Earlier today pt developed PEA arrest treated w/ 3 min of CPR. Intubated and transferred to ICU.  Repeat CXR shows diffuse pulmonary opacity that could releft edema or infection. Rt pleural effusion also noted. Of note, echocardiogram noted large left pleural effusion.   He is intubated and sedated. On amio gtt at 60 hr. Remains in afib but rates improved now in 80s-low 100s. On low dose NE 1 mcg/min. SBP 90.  Wife present at bedside. She reports he has struggled more in recent months w/ severe peripheral neuropathy in legs and feet. Not very active at baseline. Sits in his recliner most of the day and ambulates w/ walker. There is also concern for possible mild dementia. He started complaining of increased fatigue and dyspnea 5 days ago. She believes both of his parents had  HF.    Echo 2015: EF 60-65%, G1DD, mild LVH, mild MR, RV normal Echo 10/2020: EF 55-60%, GIIDD, mid-moderate AS  Mean gradient 17. RV normal  Limited Echo 11/2020: EF 20% RV moderately reduced. Severe-mod MR. Severely calcified AV. AV VTI 0.31 ms   Review of Systems: [y] = yes, [ ]  = no   . General: Weight gain [ ] ; Weight loss [ ] ; Anorexia [ ] ; Fatigue [ Y]; Fever [ ] ; Chills [ ] ; Weakness [ ]   . Cardiac: Chest pain/pressure [ Y]; Resting SOB [Y ]; Exertional SOB [ Y]; Orthopnea [ ] ; Pedal Edema [ ] ; Palpitations [Y ]; Syncope [ ] ; Presyncope [ ] ; Paroxysmal nocturnal dyspnea[ ]   . Pulmonary: Cough [ ] ; Wheezing[ ] ; Hemoptysis[ ] ; Sputum [ ] ; Snoring [ ]   . GI: Vomiting[ ] ; Dysphagia[ ] ; Melena[ ] ; Hematochezia [ ] ; Heartburn[ ] ; Abdominal pain [ ] ; Constipation [ ] ; Diarrhea [ ] ; BRBPR [ ]   . GU: Hematuria[ ] ; Dysuria [ ] ; Nocturia[ ]   . Vascular: Pain in legs with walking [ ] ; Pain in feet with lying flat [ ] ; Non-healing sores [ ] ; Stroke [ ] ; TIA [ ] ; Slurred speech [ ] ;  . Neuro: Headaches[ ] ; Vertigo[ ] ; Seizures[ ] ; Paresthesias[ ] ;Blurred vision [ ] ; Diplopia [ ] ; Vision changes [ ]   . Ortho/Skin: Arthritis [ ] ; Joint pain [ ] ; Muscle pain [ ] ; Joint swelling [ ] ; Back Pain [ ] ; Rash [ ]   . Psych: Depression[ ] ; Anxiety[ ]   . Heme:  Bleeding problems [ ] ; Clotting disorders [ ] ; Anemia [ ]   . Endocrine: Diabetes [ ] ; Thyroid dysfunction[ ]   Home Medications Prior to Admission medications   Medication Sig Start Date End Date Taking? Authorizing Provider  acetaminophen (TYLENOL) 650 MG CR tablet Take 1,300 mg by mouth 3 (three) times daily.   Yes [provider]  amLODipine (NORVASC) 5 MG tablet TAKE ONE TABLET BY MOUTH DAILY Patient taking differently: Take 5 mg by mouth daily. 09/06/20  Yes Koberlein, Steele Berg, MD  aspirin 81 MG tablet Take 81 mg by mouth at bedtime.   Yes [provider]  donepezil (ARICEPT) 5 MG tablet Take 1/2 tablet daily for 2 weeks, then  increase to 1 tablet daily Patient taking differently: Take 5 mg by mouth in the morning. 06/24/20  Yes Cameron Sprang, MD  ferrous sulfate 325 (65 FE) MG tablet Take 325 mg by mouth every other day.    Yes [provider]  furosemide (LASIX) 40 MG tablet TAKE ONE TABLET BY MOUTH DAILY Patient taking differently: Take 40 mg by mouth in the morning. 11/11/20  Yes Koberlein, Steele Berg, MD  HUMALOG KWIKPEN 100 UNIT/ML KwikPen INJECT 6 TO 10 UNITS INTO THE SKIN BEFORE EACH MEAL THREE TIMES A DAY Patient taking differently: Inject 6-10 Units into the skin 3 (three) times daily before meals. 04/16/20  Yes Philemon Kingdom, MD  insulin detemir (LEVEMIR FLEXTOUCH) 100 UNIT/ML FlexPen INJECT 20 UNITS INTO THE SKIN DAILY AT 10PM Patient taking differently: Inject 14-16 Units into the skin See admin instructions. Inject 14-16 units into the skin at 10 PM 09/06/20  Yes Philemon Kingdom, MD  Magnesium 200 MG TABS Take 1 tablet (200 mg total) by mouth daily. 12/17/17  Yes Lendon Colonel, NP  metFORMIN (GLUCOPHAGE) 1000 MG tablet TAKE ONE TABLET BY MOUTH TWICE A DAY WITH MEALS Patient taking differently: Take 1,000 mg by mouth 2 (two) times daily with a meal. 11/11/20  Yes Philemon Kingdom, MD  metoprolol succinate (TOPROL XL) 25 MG 24 hr tablet Take 0.5 tablets (12.5 mg total) by mouth daily. Patient taking differently: Take 12.5 mg by mouth at bedtime. 09/30/20  Yes Lelon Perla, MD  Multiple Vitamins-Minerals (CENTRUM SILVER PO) Take 1 tablet by mouth daily.    Yes [provider]  NON FORMULARY Place 1-2 sprays into both nostrils See admin instructions. [employer] Simply Saline Nasal Relief- Instill 1-2 sprays into each nostril one to two times a day   Yes [provider]  omeprazole (PRILOSEC) 20 MG capsule TAKE ONE CAPSULE BY MOUTH DAILY Patient taking differently: Take 20 mg by mouth daily before breakfast. 08/30/20  Yes Koberlein, Junell C, MD  pravastatin (PRAVACHOL) 40  MG tablet TAKE ONE TABLET BY MOUTH EVERY EVENING Patient taking differently: Take 40 mg by mouth every evening. 12/26/19  Yes Lelon Perla, MD  Probiotic Product (PROBIOTIC PO) Take 1 capsule by mouth 3 (three) times a week.   Yes [provider]  Propylene Glycol 0.6 % SOLN Place 2 drops into both eyes See admin instructions. Instill 2 drops into both eyes one to two times a day   Yes [provider]  sennosides-docusate sodium (SENOKOT-S) 8.6-50 MG tablet Take 1 tablet by mouth in the morning and at bedtime.   Yes [provider]  vitamin B-12 (CYANOCOBALAMIN) 1000 MCG tablet Take 1,000 mcg by mouth every other day.   Yes [provider]  Accu-Chek Softclix Lancets lancets Use to  check blood sugar 5 times a day. 03/30/20   Carlus Pavlov, MD  B-D UF III MINI PEN NEEDLES 31G X 5 MM MISC USE TO TEST BLOOD SUGAR FOUR TIMES A DAY Patient taking differently: 4 (four) times daily. 12/01/19   Carlus Pavlov, MD  Blood Glucose Monitoring Suppl (ACCU-CHEK GUIDE) w/Device KIT 1 kit by Does not apply route See admin instructions. Check blood sugar 3 times a day 01/26/20   Carlus Pavlov, MD  glucose blood (ACCU-CHEK GUIDE) test strip Use to check blood sugar 3 times a day 03/19/20   Carlus Pavlov, MD    Past Medical History: Past Medical History:  Diagnosis Date  . Anemia   . Atherosclerotic PVD with intermittent claudication   . Chronic diastolic congestive heart failure 11/23/2014  . DDD (degenerative disc disease), cervical 08/02/2017  . Diastolic dysfunction    a. 10/2020 Echo: EF 55-60%, no rwma, Gr2 DD, RVSP ~ 33.18mmHg. Mod dil LA, mildly dil RA. Mild MR. Mod AS.  Marland Kitchen Diverticulosis   . DVT (deep venous thrombosis)   . Edema 08/19/2014  . GERD (gastroesophageal reflux disease)    hx hiatal hernia  . History of pneumonia 2002  . History of skin cancer 07/30/2019   Follows with skin surgery center  . Hyperlipidemia associated with type 2 diabetes  mellitus 05/03/2017  . Hypertension associated with type 2 diabetes mellitus 08/20/2007  . IBS (irritable bowel syndrome)   . Mild neurocognitive disorder 07/07/2020  . Moderate aortic stenosis    a. 10/2020 Echo: Mod AS.  . Multiple lacunar infarcts    Two in the right basal ganglia occurring between 2016 and 2021  . Non-obstructive CAD (coronary artery disease)    a. 08/2014 Cath: LM 30, LAD 40-50, LCX nl, RCA nl. PCWP 4, PA 19/4; b. 01/2018 MV: EF 55-65%, no ischemia/infarct.  Marland Kitchen OAB (overactive bladder) 03/26/2015   History of stress incontinence  . Osteoarthritis   . Peripheral neuropathy associated with type 2 diabetes mellitus 05/30/2010  . PVC's (premature ventricular contractions)    a. 09/2020 Zio: sinus brady/RSR, PACs, brief PAT, PVCs, 4 beats NSVT.  Marland Kitchen Shingles   . Type 2 diabetes mellitus with long-term current use of insulin 05/03/2017  . Varicose veins of lower extremities with other complications 01/09/2013    Past Surgical History: Past Surgical History:  Procedure Laterality Date  . BACK SURGERY    . CATARACT EXTRACTION Bilateral 08/10/2015,08/15/2015  . COLONOSCOPY     Hx: of  . CTS bilateral    . KNEE ARTHROSCOPY  1988, 1992   bilateral  . LEFT AND RIGHT HEART CATHETERIZATION WITH CORONARY ANGIOGRAM N/A 09/03/2014   Procedure: LEFT AND RIGHT HEART CATHETERIZATION WITH CORONARY ANGIOGRAM;  Surgeon: Micheline Chapman, MD;  Location: [facility] CATH LAB;  Service: Cardiovascular;  Laterality: N/A;  . LEFT HEART CATH AND CORONARY ANGIOGRAPHY N/A 11/29/2020   Procedure: LEFT HEART CATH AND CORONARY ANGIOGRAPHY;  Surgeon: Corky Crafts, MD;  Location: [facility] INVASIVE CV LAB;  Service: Cardiovascular;  Laterality: N/A;  . LUMBAR LAMINECTOMY/DECOMPRESSION MICRODISCECTOMY N/A 08/18/2013   Procedure: Lumbar two-three, lumbar three-four, lumbar four-five decompressive laminectomies;  Surgeon: Cristi Loron, MD;  Location: [facility] NEURO ORS;  Service: Neurosurgery;  Laterality: N/A;  .  TONSILLECTOMY    . TOTAL KNEE ARTHROPLASTY Bilateral 2005, 2007    Family History: Family History  Problem Relation Age of Onset  . Other Mother 70       CABG  . Heart disease Mother   .  Hypertension Mother   . Dementia Father        Cognitive and behavioral changes following anesthesia  . Hyperlipidemia Son     Social History: Social History   Socioeconomic History  . Marital status: Married    Spouse name: Not on file  . Number of children: 2  . Years of education: 60  . Highest education level: High school graduate  Occupational History  . Occupation: Retired  Tobacco Use  . Smoking status: Never Smoker  . Smokeless tobacco: Never Used  Vaping Use  . Vaping Use: Never used  Substance and Sexual Activity  . Alcohol use: No    Alcohol/week: 0.0 standard drinks  . Drug use: No  . Sexual activity: Not on file  Other Topics Concern  . 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 Difficulty of Paying Living Expenses: Not hard at all  Food Insecurity: No Food Insecurity  . Worried About Charity fundraiser in the Last Year: Never true  . Ran Out of Food in the Last Year: Never true  Transportation Needs: No Transportation Needs  . Lack of Transportation (Medical): No  . Lack of Transportation (Non-Medical): No  Physical Activity: Inactive  . Days of Exercise per Week: 0 days  . Minutes of Exercise per Session: 0 min  Stress: No Stress Concern Present  . Feeling of Stress : Not at all  Social Connections: Moderately Integrated  . Frequency of Communication with Friends and Family: More  than three times a week  . Frequency of Social Gatherings with Friends and Family: Once a week  . Attends Religious Services: More than 4 times per year  . Active Member of Clubs or Organizations: No  . Attends Archivist Meetings: Never  . Marital Status: Married    Allergies:  Allergies  Allergen Reactions  . Ciprofloxacin Other (See Comments)    "made me have a weird feeling- felt disoriented.."  . Sulfamethoxazole Rash    Objective:    Vital Signs:   Temp:  [97.5 F (36.4 C)-98.4 F (36.9 C)] 97.5 F (36.4 C) (12/22 0959) Pulse Rate:  [48-130] 113 (12/22 1045) Resp:  [15-37] 30 (12/22 1045) BP: (98-139)/(62-103) 113/96 (12/22 1045) SpO2:  [74 %-100 %] 100 % (12/22 1045) FiO2 (%):  [100 %] 100 % (12/22 0913) Last BM Date: 11/18/2020  Weight change: Filed Weights   11/28/2020 1616 12/03/2020 0438  Weight: 82.6 kg 85.9 kg    Intake/Output:   Intake/Output Summary (Last 24 hours) at 12/01/2020 1112 Last data filed at 11/27/2020 2059 Gross per 24 hour  Intake 351.43 ml  Output 1050 ml  Net -698.57 ml      Physical Exam    General:  Elderly male, intubated and sedated  HEENT: normal + ETT Neck: supple. JVP to well visualized . Carotids 2+ bilat; no bruits. No lymphadenopathy or thyromegaly appreciated. Cor: PMI nondisplaced. Irregularly irregular & rhythm and rate. No rubs, gallops or murmurs. Lungs: intubated and  clear Abdomen: soft, nontender, nondistended. No hepatosplenomegaly. No bruits or masses. Good bowel sounds. Extremities: no cyanosis, clubbing, rash, edema, distal LEE cool to touch  Neuro: intubated and sedated   Telemetry   Atrial Fibrillation 80s-low 100s.   EKG    Admit EKG 12/20: rapid atrial fibrillation 140s EKG 12/21 Afib / frequent PVCs and low voltage QRS 110 bpm    Labs   Basic Metabolic Panel: Recent Labs  Lab 12/07/2020 1620 11/19/2020 0215 12/04/2020 1052 12/01/20 0249 12/01/20 0958  NA 138  --  140 140 139  K 4.5  --   4.5 5.5* 4.8  CL 104  --   --  105  --   CO2 24  --   --  22  --   GLUCOSE 231*  --   --  217*  --   BUN 29*  --   --  36*  --   CREATININE 1.10  --   --  1.48*  --   CALCIUM 9.4  --   --  9.4  --   MG 1.6* 1.8  --   --   --     Liver Function Tests: Recent Labs  Lab 12/08/2020 1620 12/01/20 0249  AST 25 29  ALT 22 23  ALKPHOS 34* 40  BILITOT 1.0 1.0  PROT 6.4* 6.5  ALBUMIN 3.7 3.7   No results for input(s): LIPASE, AMYLASE in the last 168 hours. No results for input(s): AMMONIA in the last 168 hours.  CBC: Recent Labs  Lab 11/11/2020 1620 12/03/2020 2326 12/04/2020 0215 11/11/2020 1052 12/01/20 0249 12/01/20 0958  WBC 8.1 7.7 7.6  --  12.7*  --   NEUTROABS  --  4.2  --   --   --   --   HGB 11.0* 10.6* 10.3* 11.6* 11.6* 11.6*  HCT 36.9* 35.2* 33.9* 34.0* 36.9* 34.0*  MCV 100.5* 100.0 98.3  --  98.9  --   PLT 207 193 189  --  239  --     Cardiac Enzymes: No results for input(s): CKTOTAL, CKMB, CKMBINDEX, TROPONINI in the last 168 hours.  BNP: BNP (last 3 results) Recent Labs    12/01/2020 1626  BNP 975.6*    ProBNP (last 3 results) No results for input(s): PROBNP in the last 8760 hours.   CBG: Recent Labs  Lab 11/23/2020 1116 11/21/2020 1619 11/10/2020 2110 12/01/20 0607 12/01/20 0955  GLUCAP 220* 211* 250* 167* 266*    Coagulation Studies: Recent Labs    12/09/2020 0215  LABPROT 14.4  INR 1.2     Imaging   CT HEAD WO CONTRAST  Result Date: 12/01/2020 CLINICAL DATA:  Mental status change with unknown cause EXAM: CT HEAD WITHOUT CONTRAST TECHNIQUE: Contiguous axial images were obtained from the base of the skull through the vertex without intravenous contrast. COMPARISON:  Brain MRI 03/31/2020 FINDINGS: Brain: No evidence of acute infarction, hemorrhage, hydrocephalus, extra-axial collection or mass lesion/mass effect. Mild cerebral volume loss and chronic small vessel ischemic low-density in the white matter Vascular: No hyperdense vessel or unexpected  calcification. Skull: Negative for fracture or focal lesion. Retro dental ligamentous thickening and calcification which contacts the ventral cervicomedullary junction. Sinuses/Orbits: No acute finding. IMPRESSION: No acute or reversible finding. Electronically Signed   By: Monte Fantasia M.D.   On: 12/01/2020 06:35   DG CHEST PORT 1 VIEW  Result Date: 12/01/2020 CLINICAL DATA:  Hypoxia.  New onset confusion EXAM: PORTABLE CHEST 1 VIEW COMPARISON:  Two days ago  FINDINGS: Increased hazy opacity of the right chest, usually layering pleural fluid. Underlying pulmonary opacity which is both interstitial and airspace and seen on both sides. Cardiomegaly. No visible pneumothorax. IMPRESSION: 1. Increased right chest opacification, likely layering pleural fluid. 2. Diffuse pulmonary opacity that could be edema or infection. Electronically Signed   By: Marnee Spring M.D.   On: 12/01/2020 08:32   ECHOCARDIOGRAM LIMITED  Result Date: 12/01/2020    ECHOCARDIOGRAM LIMITED REPORT   Patient Name:   KRISTA SOM [facility] Date of Exam: 12/01/2020 Medical Rec #:  [id number]      Height:       70.0 in Accession #:    [id number]     Weight:       189.4 lb Date of Birth:  1933-08-01     BSA:          2.039 m Patient Age:    87 years       BP:           132/96 mmHg Patient Gender: M              HR:           130 bpm. Exam Location:  Inpatient Procedure: Limited Echo, Limited Color Doppler and Cardiac Doppler Indications:    CHF-Acute Systolic I50.21  History:        Patient has prior history of Echocardiogram examinations, most                 recent 10/26/2020. CHF, CAD, Aortic Valve Disease,                 Arrythmias:PVCs/NSVT and Atrial Fibrillation; Risk                 Factors:Diabetes and Hypertension. Non-ST elevation MI versus                 demand ischemia,                 Macrocytic anemia.  Sonographer:    Leta Jungling RDCS Referring Phys: [id number] Corrin Parker  Sonographer Comments: Echo performed with patient  supine and on artificial respirator. IMPRESSIONS  1. Bedside echocardiogram post PEA arrest, performed STAT.  2. Left ventricular ejection fraction, by estimation, is 20%. The left ventricle has severely decreased function. Best preserved function in the lateral wall. There is mild left ventricular hypertrophy.  3. Right ventricular systolic function is moderately reduced. The right ventricular size is moderately enlarged. There is mildly elevated pulmonary artery systolic pressure. The estimated right ventricular systolic pressure is 38.0 mmHg.  4. Large pleural effusion.  5. The mitral valve is degenerative. Moderate to severe mitral valve regurgitation, likely secondary MR due to LV dysfunction.  6. The aortic valve is abnormal. There is severe calcifcation of the aortic valve. Gradient not assessed on this focused study.  7. The inferior vena cava is dilated in size with <50% respiratory variability, suggesting right atrial pressure of 15 mmHg. FINDINGS  Left Ventricle: Left ventricular ejection fraction, by estimation, is 20%. The left ventricle has severely decreased function. The left ventricular internal cavity size was normal in size. There is mild left ventricular hypertrophy. Right Ventricle: The right ventricular size is moderately enlarged. Right ventricular systolic function is moderately reduced. There is mildly elevated pulmonary artery systolic pressure. The tricuspid regurgitant velocity is 2.40 m/s, and with an assumed right atrial pressure of 15 mmHg, the estimated right ventricular systolic pressure is 38.0 mmHg. Pericardium: Large  pleural effusion. Mitral Valve: The mitral valve is degenerative in appearance. Moderate to severe mitral valve regurgitation. Aortic Valve: The aortic valve is abnormal. There is severe calcifcation of the aortic valve. Aortic valve mean gradient measures 9.0 mmHg. Aortic valve peak gradient measures 16.2 mmHg. Venous: The inferior vena cava is dilated in size with  less than 50% respiratory variability, suggesting right atrial pressure of 15 mmHg. LEFT VENTRICLE PLAX 2D LVIDd:         5.10 cm LVIDs:         4.50 cm LV PW:         1.20 cm LV IVS:        1.20 cm  LV Volumes (MOD) LV vol d, MOD A2C: 124.0 ml LV vol d, MOD A4C: 137.0 ml LV vol s, MOD A2C: 99.4 ml LV vol s, MOD A4C: 111.0 ml LV SV MOD A2C:     24.6 ml LV SV MOD A4C:     137.0 ml LV SV MOD BP:      30.9 ml LEFT ATRIUM         Index LA diam:    4.60 cm 2.26 cm/m  AORTIC VALVE AV Vmax:      201.00 cm/s AV Vmean:     131.000 cm/s AV VTI:       0.316 m AV Peak Grad: 16.2 mmHg AV Mean Grad: 9.0 mmHg TRICUSPID VALVE TR Peak grad:   23.0 mmHg TR Vmax:        240.00 cm/s Cherlynn Kaiser MD Electronically signed by Cherlynn Kaiser MD Signature Date/Time: 12/01/2020/10:26:47 AM    Final       Medications:     Current Medications: . acetaminophen  1,000 mg Per Tube TID  . aspirin  81 mg Per Tube QHS  . Chlorhexidine Gluconate Cloth  6 each Topical Daily  . docusate  100 mg Per Tube BID  . donepezil  5 mg Oral q AM  . fentaNYL (SUBLIMAZE) injection  25 mcg Intravenous Once  . ferrous sulfate  220 mg Per Tube QODAY  . furosemide  60 mg Intravenous BID  . furosemide  80 mg Intravenous Once  . insulin aspart  0-5 Units Subcutaneous QHS  . insulin aspart  0-9 Units Subcutaneous TID WC  . insulin detemir  10 Units Subcutaneous BID  . metoprolol tartrate  25 mg Per Tube TID  . pantoprazole sodium  40 mg Per Tube Daily  . polyethylene glycol  17 g Per Tube Daily  . pravastatin  40 mg Per Tube QPM  . senna-docusate  1 tablet Per Tube BID  . sodium chloride flush  3 mL Intravenous Q12H  . sodium chloride flush  3 mL Intravenous Q12H  . sodium zirconium cyclosilicate  5 g Oral Once     Infusions: . sodium chloride    . amiodarone    . amiodarone     Followed by  . amiodarone    . dexmedetomidine (PRECEDEX) IV infusion    . fentaNYL infusion INTRAVENOUS 100 mcg/hr (12/01/20 1030)  . heparin          Assessment/Plan   1. Acute Biventricular Heart Failure - Echo 2015: EF 60-65%, G1DD, mild LVH, RV normal - Echo 10/2020: EF 55-60%, GIIDD, RV normal  - Limited Echo 11/2020: EF now 20% RV moderately reduced. - Based on timeline, suspect tachymediated CM secondary to rapid atrial fibrillation and frequent PVCs - Cath shows mild nonobstructive CAD  - Control Afib/PVCs w/ Amiodarone  and stop  blocker given concerns for low output  - Obtain central access and check Co-ox and follow CVPs  - Diurese w/ IV Lasix 80 mg bid (increased dose). If sluggish diuresis or if low co-ox may need inotrope's  - BP too soft for GDMT currently. Requiring low dose NE  - Given h/o bilateral CTS, lumbar stenosis, peripheral neuropathy, thicken AV, low voltage QRS and conduction disease, ? underlying cardiac amyloid. Once stabilized, will check cMRI / PYP scan - Check multiple myeloma panel and urine immunofixation    2. Atrial Fibrillation  - newly diagnosed, rates in 140s on admit - TSH WNL  - suspect cause of CM. Need to keep in NSR. Rate now in the 80s-low 100s  - continue amiodarone gtt at 60 hr - stop  blocker in setting of acute decompensated HF/ cardiogenic shock  - keep K > 4.0 and Mg >2.0  - continue heparin gtt   3. AKI  - baseline SCr 0.9 - 1.10 on admit, 1.48 today  - suspect cardiorenal  - monitor w/ diuresis. May need inotropes   4. Moderate- Severe MR  - mild MR noted on echo 11/21 (EF normal) - now mod-severe on Limited echo 12/21. EF now 20%, c/w functional MR secondary to severe LV dysfunction - continue diuresis   5. Aortic Stenosis  - mild- moderate by echo 11/21, mean gradient 17 mmHg. LVOT/AV VTI ratio: 0.31  6. PEA Arrest 12/01/20 - s/p resuscitation w/ 3 min CPR   7. Acute Hypoxic Respiratory Failure  - in setting or PEA arrest and acute pulmonary edema ? Cardiogenic shock - continue diuresis - vent management per PCCM   8. Elevated Hs Troponin  - Hs  peaked at 1,041>>945 - cath w/ no significant CAD - demand ischemia from rapid afib and acute CHF   9. Iron Deficiency Anemia  - Hgb 10.3. Fe 35. Tsat 10% Ferritin 22  - on PO iron  - recommend holding off on feraheme infusion for now, in case of cMRI   10. T2DM DM  - Hgb A1c 7.0  - management per primary team   11. Peripheral Neuropathy  - h/o bilateral carpal tunnel + bilateral LE neuropathy  - w/u for amyloid per above   Length of Stay: 2  Lyda Jester, PA-C  12/01/2020, 11:12 AM  Advanced Heart Failure Team Pager [phone] (M-F; 7a - 4p)  Please contact [facility] for night-coverage after hours (4p -7a ) and weekends on [email or web address]  Agree with above.   86 y/o male with mild to moderate AS, DM2 and diastolic HF admitted acute HF due to new AF and tachy-induced CM. Hstrop moderately elevated so had LHC with non-obstructive CAD and LVEDP 30. EF 25-30% on echo (was normal in 11/21). Subsequently has had PEA arrest in setting of hypoxia.  Now intubated and on DBA 5 and NE 1 as well as amio. Remains in AF. Co-ox 71%  General:  Intubated/sedated HEENT: normal + ETT  Neck: supple. RIJ TLC JVP up. Carotids 2+ bilat; no bruits. No lymphadenopathy or thryomegaly appreciated. Cor: PMI nondisplaced. Irreg tachy +s3 2/6 AS Lungs: clear Abdomen: soft, nontender, nondistended. No hepatosplenomegaly. No bruits or masses. Good bowel sounds. Extremities: no cyanosis, clubbing, rash, edema Neuro: intubated sedated   He has severe HF due to tachy-induced NICM in setting of AF with RVR. Now s/p PEA arrest. Agree with hemodynamic support with NE and DBA. Co-ox now looks good. Will diurese. Continue amio for rate control. Heparin  for [facility]. Will need eventual TEE/DC-CV. Would do this prior to extubation.   CRITICAL CARE Performed by: Glori Bickers  Total critical care time: 45 minutes  Critical care time was exclusive of separately billable procedures and treating other  patients.  Critical care was necessary to treat or prevent imminent or life-threatening deterioration.  Critical care was time spent personally by me (independent of midlevel providers or residents) on the following activities: development of treatment plan with patient and/or surrogate as well as nursing, discussions with consultants, evaluation of patient's response to treatment, examination of patient, obtaining history from patient or surrogate, ordering and performing treatments and interventions, ordering and review of laboratory studies, ordering and review of radiographic studies, pulse oximetry and re-evaluation of patient's condition.  Glori Bickers, MD  6:36 PM

## 2020-12-01 NOTE — Procedures (Signed)
  Central Venous Catheter Insertion Procedure Note  [name]  [id number]  September 25, 1933  Date:12/01/20  Time:2:22 PM   Provider Performing:Vasili Lion Fernandez   Procedure: Insertion of Non-tunneled Central Venous [phone]) with US guidance ([id number])   Indication(s) Medication administration  Consent Risks of the procedure as well as the alternatives and risks of each were explained to the patient and/or caregiver.  Consent for the procedure was obtained and is signed in the bedside chart  Anesthesia Topical only with 1% lidocaine   Timeout Verified patient identification, verified procedure, site/side was marked, verified correct patient position, special equipment/implants available, medications/allergies/relevant history reviewed, required imaging and test results available.  Sterile Technique Maximal sterile technique including full sterile barrier drape, hand hygiene, sterile gown, sterile gloves, mask, hair covering, sterile ultrasound probe cover (if used).  Procedure Description Area of catheter insertion was cleaned with chlorhexidine and draped in sterile fashion.  With real-time ultrasound guidance a central venous catheter was placed into the right internal jugular vein. Nonpulsatile blood flow and easy flushing noted in all ports.  The catheter was sutured in place and sterile dressing applied.  Complications/Tolerance None; patient tolerated the procedure well. Chest X-ray is ordered to verify placement for internal jugular or subclavian cannulation.   Chest x-ray is not ordered for femoral cannulation.  EBL Minimal  Specimen(s) None

## 2020-12-01 NOTE — Significant Event (Signed)
Patient was noticed to be dizzy and confused over the last 3 to 4 hours.  Patient has had a cardiac cath yesterday.  On exam patient is oriented to name otherwise confused moving all extremities 5 x 5 feeling bit nauseated pupils equal and reacting to light.  No facial asymmetry.  Tongue is midline.  CT head is unremarkable.  Discussed with on-call neurologist Dr. Lorrin Goodell at this time advised getting MRI brain.  Will discuss with oncoming hospitalist.  Gean Birchwood

## 2020-12-01 NOTE — Progress Notes (Signed)
   12/01/20 0830  Clinical Encounter Type  Visited With Patient and family together  Visit Type Code  Referral From Nurse  Consult/Referral To Chaplain  Spiritual Encounters  Spiritual Needs Prayer;Emotional  Stress Factors  Patient Stress Factors Major life changes  Family Stress Factors Major life changes  Chaplain responded to Code Blue. Wife at bedside. Patient coded twice. He was intubated and moved to 42M. Patient stayed with wife and patient's son until physician could come out and give details of patient's condition. Chaplain offered prayer and emotional support and visited patient with his wife, son and daughter-in-law. Chaplain available when needed.

## 2020-12-01 NOTE — Plan of Care (Signed)
  Problem: Education: Goal: Knowledge of General Education information will improve Description: Including pain rating scale, medication(s)/side effects and non-pharmacologic comfort measures Outcome: Progressing   Problem: Health Behavior/Discharge Planning: Goal: Ability to manage health-related needs will improve Outcome: Progressing   Problem: Clinical Measurements: Goal: Ability to maintain clinical measurements within normal limits will improve Outcome: Progressing Goal: Will remain free from infection Outcome: Progressing Goal: Diagnostic test results will improve Outcome: Progressing Goal: Respiratory complications will improve Outcome: Progressing   Problem: Activity: Goal: Risk for activity intolerance will decrease Outcome: Progressing   Problem: Nutrition: Goal: Adequate nutrition will be maintained Outcome: Progressing   Problem: Coping: Goal: Level of anxiety will decrease Outcome: Progressing   Problem: Elimination: Goal: Will not experience complications related to bowel motility Outcome: Progressing   Problem: Pain Managment: Goal: General experience of comfort will improve Outcome: Progressing   Problem: Safety: Goal: Ability to remain free from injury will improve Outcome: Progressing   Problem: Education: Goal: Knowledge of disease or condition will improve Outcome: Progressing Goal: Understanding of medication regimen will improve Outcome: Progressing

## 2020-12-01 NOTE — Progress Notes (Addendum)
PROGRESS NOTE    [name]  [id number] DOB: Sep 16, 1933 DOA: 11/15/2020 PCP: Caren Macadam, MD  Brief Narrative: 86 year old male with history of CAD, chronic diastolic CHF, moderate aortic stenosis, history of PVCs/NSVT on metoprolol, type 2 diabetes mellitus on insulin, hypertension, neuropathy presented to urgent care 12/20 with chest pain dyspnea and near syncope, he was noted to be in A. fib RVR and subsequently sent to the emergency room.  -Symptoms of progressive worsening dyspnea for 3 days, intermittent substernal chest pressure. -In the ED patient was noted to be in A. fib RVR with heart rate in the 140s, noted to have elevated high-sensitivity troponin of 800, BNP of 975, chest x-ray noted mild pulmonary edema  Subjective Patient seen and examined earlier.  Although he was still requiring 5 L of oxygen but he denied having any shortness of breath, palpitation or chest pain.  He was in A. fib with RVR with rates around 130.  Patient was alert and oriented x3.  Patient's primary RN was present at the bedside.  However 10 to 15 minutes after I completed my encounter with him, a code was called.  Patient was initially in respiratory arrest and then cardiopulmonary arrest.  No pulse.  Code was initiated.  He was given 3 doses of epinephrine with CPR started which ran for couple of minutes.  Patient's pulse returned.  Patient's family was at the bedside.  Initially patient's wife was not ready for him to be intubated but subsequently after discussion, she cleared him for intubation.  Patient was intubated and right after that, he lost his pulse and code was reinitiated.  Another dose of epinephrine was given and CPR was initiated.  3 minutes into CPR again, his pulse was back.  He was subsequently moved to ICU.  PCCM was consulted and will assume care of the patient from here on.  Cardiologist Dr. Nadean Corwin was at the bedside during the code.  Assessment & Plan:     Atrial  fibrillation with rapid ventricular response (HCC) He was in rapid ventricular response.  Heart rate around 130.  He was not on any IV medications.  Beta-blocker oral as well as Eliquis were was ordered.  Non-ST elevation MI versus demand ischemia Patient underwent left heart catheterization on 11/13/2020.  Did not have significant obstruction.  LVEF was 30%.  Management per cardiology.  Acute on chronic diastolic CHF History of moderate aortic stenosis -Last echo in November with preserved EF and grade 2 diastolic dysfunction however echo at this time showed 30 to 35% ejection fraction.  Patient has been requiring oxygen since yesterday.  X-ray was repeated stat this morning which showed worsening pulmonary edema.  Patient has poor urinary output since yesterday.  He is on Lasix 40 mg IV twice daily.  Type 2 diabetes mellitus on insulin Continue current management  Hyperkalemia: 5.5.  1 dose of Lokelma was ordered but patient coded before that was given.  AKI: Jump in creatinine, could very well be contrast nephropathy.  Macrocytic anemia -Monitor  Mild memory loss, early dementia -Continue Aricept  Cardiopulmonary arrest: Details as above in the subjective part.  Patient intubated and currently in ICU.  PCCM to take over.    DVT prophylaxis: IV heparin Code Status: Full code, will need CODE STATUS to be discussed Family Communication: Wife at bedside Disposition Plan:  Status is: Inpatient  Remains inpatient appropriate because:Inpatient level of care appropriate due to severity of illness   Dispo: The patient is from:  Home              Anticipated d/c is to: Likely home              Anticipated d/c date is: 3 days              Patient currently is not medically stable to d/c.  Consultants:   Cardiology   Procedures:   Antimicrobials:   Objective: Vitals:   12/01/20 0033 12/01/20 0446 12/01/20 0746 12/01/20 0900  BP: 111/84 125/90 (!) 132/96 105/64  Pulse: (!)  124 (!) 103 (!) 130   Resp: (!) 25 (!) 33 (!) 24   Temp: 98.4 F (36.9 C) 97.9 F (36.6 C) (!) 97.5 F (36.4 C)   TempSrc: Oral Oral Oral   SpO2: 95% 95% 92%   Weight:      Height:        Intake/Output Summary (Last 24 hours) at 12/01/2020 0905 Last data filed at 12/03/2020 2059 Gross per 24 hour  Intake 351.43 ml  Output 1050 ml  Net -698.57 ml   Filed Weights   11/25/2020 1616 11/22/2020 0438  Weight: 82.6 kg 85.9 kg    Examination:  General exam:  General exam: Appears calm and comfortable  Respiratory system: Diffuse crackles bilaterally in all lobes. Respiratory effort normal. Cardiovascular system: S1 & S2 heard, RRR. No JVD, murmurs, rubs, gallops or clicks. No pedal edema. Gastrointestinal system: Abdomen is nondistended, soft and nontender. No organomegaly or masses felt. Normal bowel sounds heard. Central nervous system: Alert and oriented. No focal neurological deficits. Extremities: Symmetric 5 x 5 power. Skin: No rashes, lesions or ulcers.  Psychiatry: Judgement and insight appear normal. Mood & affect appropriate.   Data Reviewed:   CBC: Recent Labs  Lab 11/15/2020 1620 11/26/2020 2326 12/09/2020 0215 12/01/2020 1052 12/01/20 0249  WBC 8.1 7.7 7.6  --  12.7*  NEUTROABS  --  4.2  --   --   --   HGB 11.0* 10.6* 10.3* 11.6* 11.6*  HCT 36.9* 35.2* 33.9* 34.0* 36.9*  MCV 100.5* 100.0 98.3  --  98.9  PLT 207 193 189  --  [id number]   Basic Metabolic Panel: Recent Labs  Lab 11/24/2020 1620 12/06/2020 0215 11/17/2020 1052 12/01/20 0249  NA 138  --  140 140  K 4.5  --  4.5 5.5*  CL 104  --   --  105  CO2 24  --   --  22  GLUCOSE 231*  --   --  217*  BUN 29*  --   --  36*  CREATININE 1.10  --   --  1.48*  CALCIUM 9.4  --   --  9.4  MG 1.6* 1.8  --   --    GFR: Estimated Creatinine Clearance: 37.7 mL/min (A) (by C-G formula based on SCr of 1.48 mg/dL (H)). Liver Function Tests: Recent Labs  Lab 11/23/2020 1620 12/01/20 0249  AST 25 29  ALT 22 23  ALKPHOS 34* 40   BILITOT 1.0 1.0  PROT 6.4* 6.5  ALBUMIN 3.7 3.7   No results for input(s): LIPASE, AMYLASE in the last 168 hours. No results for input(s): AMMONIA in the last 168 hours. Coagulation Profile: Recent Labs  Lab 11/29/2020 0215  INR 1.2   Cardiac Enzymes: No results for input(s): CKTOTAL, CKMB, CKMBINDEX, TROPONINI in the last 168 hours. BNP (last 3 results) No results for input(s): PROBNP in the last 8760 hours. HbA1C: Recent Labs    11/28/2020  2326  HGBA1C 7.0*   CBG: Recent Labs  Lab 12/09/2020 0737 12/04/2020 1116 11/27/2020 1619 11/14/2020 2110 12/01/20 0607  GLUCAP 181* 220* 211* 250* 167*   Lipid Profile: Recent Labs    12/10/2020 2326  TRIG 43   Thyroid Function Tests: Recent Labs    11/16/2020 2101  TSH 1.370   Anemia Panel: Recent Labs    11/18/2020 0215  VITAMINB12 923*  FOLATE 34.0  FERRITIN 22*  TIBC 363  IRON 35*  RETICCTPCT 1.8   Urine analysis:    Component Value Date/Time   COLORURINE YELLOW 08/06/2019 0718   APPEARANCEUR CLEAR 08/06/2019 0718   LABSPEC 1.020 08/06/2019 0718   PHURINE 6.0 08/06/2019 0718   GLUCOSEU NEGATIVE 08/06/2019 0718   HGBUR SMALL (A) 08/06/2019 0718   HGBUR trace-lysed 12/21/2008 0751   BILIRUBINUR NEGATIVE 08/06/2019 0718   BILIRUBINUR n 01/17/2013 1634   KETONESUR NEGATIVE 08/06/2019 0718   PROTEINUR 100 (A) 08/26/2015 1750   UROBILINOGEN 0.2 08/06/2019 0718   NITRITE NEGATIVE 08/06/2019 0718   LEUKOCYTESUR NEGATIVE 08/06/2019 0718   Sepsis Labs: @LABRCNTIP (procalcitonin:4,lacticidven:4)  ) Recent Results (from the past 240 hour(s))  Resp Panel by RT-PCR (Flu A&B, Covid) Nasopharyngeal Swab     Status: None   Collection Time: 12/01/2020  6:11 PM   Specimen: Nasopharyngeal Swab; Nasopharyngeal(NP) swabs in vial transport medium  Result Value Ref Range Status   SARS Coronavirus 2 by RT PCR NEGATIVE NEGATIVE Final    Comment: (NOTE) SARS-CoV-2 target nucleic acids are NOT DETECTED.  The SARS-CoV-2 RNA is generally  detectable in upper respiratory specimens during the acute phase of infection. The lowest concentration of SARS-CoV-2 viral copies this assay can detect is 138 copies/mL. A negative result does not preclude SARS-Cov-2 infection and should not be used as the sole basis for treatment or other patient management decisions. A negative result may occur with  improper specimen collection/handling, submission of specimen other than nasopharyngeal swab, presence of viral mutation(s) within the areas targeted by this assay, and inadequate number of viral copies(<138 copies/mL). A negative result must be combined with clinical observations, patient history, and epidemiological information. The expected result is Negative.  Fact Sheet for Patients:  EntrepreneurPulse.com.au  Fact Sheet for Healthcare Providers:  IncredibleEmployment.be  This test is no t yet approved or cleared by the [place] FDA and  has been authorized for detection and/or diagnosis of SARS-CoV-2 by FDA under an Emergency Use Authorization (EUA). This EUA will remain  in effect (meaning this test can be used) for the duration of the COVID-19 declaration under Section 564(b)(1) of the Act, 21 U.S.C.section 360bbb-3(b)(1), unless the authorization is terminated  or revoked sooner.       Influenza A by PCR NEGATIVE NEGATIVE Final   Influenza B by PCR NEGATIVE NEGATIVE Final    Comment: (NOTE) The Xpert Xpress SARS-CoV-2/FLU/RSV plus assay is intended as an aid in the diagnosis of influenza from Nasopharyngeal swab specimens and should not be used as a sole basis for treatment. Nasal washings and aspirates are unacceptable for Xpert Xpress SARS-CoV-2/FLU/RSV testing.  Fact Sheet for Patients: EntrepreneurPulse.com.au  Fact Sheet for Healthcare Providers: IncredibleEmployment.be  This test is not yet approved or cleared by the [place] FDA  and has been authorized for detection and/or diagnosis of SARS-CoV-2 by FDA under an Emergency Use Authorization (EUA). This EUA will remain in effect (meaning this test can be used) for the duration of the COVID-19 declaration under Section 564(b)(1) of the Act, 21 U.S.C. section  360bbb-3(b)(1), unless the authorization is terminated or revoked.  Performed at [facility], [address] [place], [place] [address]          Radiology Studies: DG Chest 1 View  Result Date: 11/24/2020 CLINICAL DATA:  cp EXAM: CHEST  1 VIEW COMPARISON:  08/26/2015 and prior. FINDINGS: No pneumothorax. Small bilateral pleural effusions and patchy basilar opacities. Cardiomegaly and central pulmonary vascular congestion. Multilevel spondylosis. IMPRESSION: Cardiomegaly and central pulmonary vascular congestion. Bibasilar opacities and small pleural effusions, edema versus infection. Electronically Signed   By: Primitivo Gauze M.D.   On: 12/03/2020 16:46   CT HEAD WO CONTRAST  Result Date: 12/01/2020 CLINICAL DATA:  Mental status change with unknown cause EXAM: CT HEAD WITHOUT CONTRAST TECHNIQUE: Contiguous axial images were obtained from the base of the skull through the vertex without intravenous contrast. COMPARISON:  Brain MRI 03/31/2020 FINDINGS: Brain: No evidence of acute infarction, hemorrhage, hydrocephalus, extra-axial collection or mass lesion/mass effect. Mild cerebral volume loss and chronic small vessel ischemic low-density in the white matter Vascular: No hyperdense vessel or unexpected calcification. Skull: Negative for fracture or focal lesion. Retro dental ligamentous thickening and calcification which contacts the ventral cervicomedullary junction. Sinuses/Orbits: No acute finding. IMPRESSION: No acute or reversible finding. Electronically Signed   By: Monte Fantasia M.D.   On: 12/01/2020 06:35   CARDIAC CATHETERIZATION  Result Date: 11/26/2020  Ost LAD to Prox LAD lesion is  30% stenosed.  There is moderate left ventricular systolic dysfunction.  LV end diastolic pressure is moderately elevated.  The left ventricular ejection fraction is 25-35% by visual estimate.  There is no aortic valve stenosis.  Nonobstructive CAD.  Systolic heart failure with elevated LVEDP.  Additional Lasix given. ? Tachycardia induced cardiomyopathy?   DG CHEST PORT 1 VIEW  Result Date: 12/01/2020 CLINICAL DATA:  Hypoxia.  New onset confusion EXAM: PORTABLE CHEST 1 VIEW COMPARISON:  Two days ago FINDINGS: Increased hazy opacity of the right chest, usually layering pleural fluid. Underlying pulmonary opacity which is both interstitial and airspace and seen on both sides. Cardiomegaly. No visible pneumothorax. IMPRESSION: 1. Increased right chest opacification, likely layering pleural fluid. 2. Diffuse pulmonary opacity that could be edema or infection. Electronically Signed   By: Monte Fantasia M.D.   On: 12/01/2020 08:32        Scheduled Meds: . acetaminophen  1,000 mg Oral TID  . apixaban  5 mg Oral BID  . aspirin EC  81 mg Oral QHS  . donepezil  5 mg Oral q AM  . ferrous sulfate  325 mg Oral QODAY  . furosemide  40 mg Intravenous BID  . insulin aspart  0-5 Units Subcutaneous QHS  . insulin aspart  0-9 Units Subcutaneous TID WC  . insulin detemir  10 Units Subcutaneous BID  . metoprolol tartrate  25 mg Oral TID  . pantoprazole  40 mg Oral Daily  . pravastatin  40 mg Oral QPM  . senna-docusate  1 tablet Oral BID  . sodium chloride flush  3 mL Intravenous Q12H  . sodium chloride flush  3 mL Intravenous Q12H  . sodium zirconium cyclosilicate  5 g Oral Once   Continuous Infusions: . sodium chloride    . amiodarone       LOS: 2 days    Time spent: 45 minutes, 30 minutes of that includes critical care time.  Darliss Cheney, MD [facility]  12/01/2020, 9:05 AM

## 2020-12-01 NOTE — Progress Notes (Signed)
Post intubation ABG obtained on ventilator settings of VT: 580, RR: 20, FIO2: 100%, and PEEP: 5.  Results given to MD.  Increased RR to 30 and PEEP to 8.  Will continue to monitor.    Ref. Range 12/01/2020 09:58  Sample type Unknown ARTERIAL  pH, Arterial Latest Ref Range: 7.350 - 7.450  7.269 (L)  pCO2 arterial Latest Ref Range: 32.0 - 48.0 mmHg 42.9  pO2, Arterial Latest Ref Range: 83.0 - 108.0 mmHg 77 (L)  TCO2 Latest Ref Range: 22 - 32 mmol/L 21 (L)  Acid-base deficit Latest Ref Range: 0.0 - 2.0 mmol/L 7.0 (H)  Bicarbonate Latest Ref Range: 20.0 - 28.0 mmol/L 19.8 (L)  O2 Saturation Latest Units: % 94.0  Patient temperature Unknown 97.5 F  Collection site Unknown Radial

## 2020-12-01 NOTE — Progress Notes (Signed)
VAST paged to Code Blue. VAST RN x2 responded. Pt with good working access. One VAST RN remained at bedside throughout code and transfer. Spoke with pt's nurse and AC; no suggestions to offer for debrief; released to return to other calls.

## 2020-12-01 NOTE — TOC Benefit Eligibility Note (Signed)
Transition of Care [facility]) Benefit Eligibility Note    Patient Details  Name: [name] MRN: [id number] Date of Birth: [birth year]   Medication/Dose: Arne Cleveland  5 MG BID  Covered?: Yes     Prescription Coverage Preferred Pharmacy: [facility]  and [facility]  Spoke with Person/Company/Phone Number:: CHASITY  @  [employer] TW # [phone]  Co-Pay: $50.00  Prior Approval: No  Deductible: Met  Additional Notes: APIXABAN : Crecencio Mc Phone Number: 12/01/2020, 3:54 PM

## 2020-12-01 NOTE — Progress Notes (Addendum)
Patient calling out stating he feels dizzy. He states that he gets them often and that he has been getting them ever since he was 86 years old. Patient denies pain, Shortness of breath (though often tachypneic), or palpations. BP 125/90 HR 124, RR 27, 100 RA Lohman 5L.  He also mention that when he gets dizzy his vision gets blurry. Patient is alert & oriented x4, NIH 0. Will continue to monitor patient.   Update 0530: patient still feeling dizzy, yelling out that he is going to fall. Paged [facility], got order for stat head CT.

## 2020-12-01 NOTE — Code Documentation (Signed)
  Patient Name: [name]   MRN: [id number]   Date of Birth/ Sex: Apr 24, 1933 , male      Admission Date: 11/28/2020  Attending Provider: Darliss Cheney, MD;Chand,*  Primary Diagnosis: Atrial fibrillation with rapid ventricular response ([place])   Indication: Pt was in his usual state of health until this AM, when he was noted to be respiratory distress and pulseless. Code blue was subsequently called. At the time of arrival on scene, ACLS protocol was underway.   Technical Description:  - CPR performance duration:  3  minute  - Was defibrillation or cardioversion used? No   - Was external pacer placed? No  - Was patient intubated pre/post CPR? Yes   Medications Administered: Y = Yes; Blank = No Amiodarone  Y  Atropine    Calcium    Epinephrine  Y  Lidocaine    Magnesium    Norepinephrine    Phenylephrine    Sodium bicarbonate    Vasopressin     Post CPR evaluation:  - Final Status - Was patient successfully resuscitated ? Yes - What is current rhythm? Atrial fibrillation with RVR - What is current hemodynamic status? Stable  Miscellaneous Information:  - Labs sent, including: Cbc, BMP, Troponin  - Primary team notified?  Yes, primary was bedside   - Family Notified? Yes, talked personally and updated   - Additional notes/ transfer status: Transferred to 2 M ICU sign off'd to Dr. Pattricia Boss, MD  12/01/2020, 10:06 AM

## 2020-12-01 NOTE — Anesthesia Procedure Notes (Signed)
Procedure Name: Intubation Date/Time: 12/01/2020 8:50 AM Performed by: Trinna Post., CRNA Pre-anesthesia Checklist: Patient identified, Emergency Drugs available, Suction available, Patient being monitored and Timeout performed Patient Re-evaluated:Patient Re-evaluated prior to induction Oxygen Delivery Method: Ambu bag Preoxygenation: Pre-oxygenation with 100% oxygen Induction Type: IV induction Ventilation: Mask ventilation without difficulty Laryngoscope Size: Glidescope and 4 Grade View: Grade I Tube type: Oral Tube size: 7.5 mm Number of attempts: 1 Airway Equipment and Method: Rigid stylet and Video-laryngoscopy Placement Confirmation: ETT inserted through vocal cords under direct vision,  positive ETCO2,  CO2 detector and breath sounds checked- equal and bilateral Secured at: 23 cm Tube secured with: Tape Dental Injury: Teeth and Oropharynx as per pre-operative assessment

## 2020-12-01 NOTE — Care Management (Signed)
1052 12-01-20 Benefits check submitted for Eliquis. Case Manager will follow for cost. Graves-Bigelow, Ocie Cornfield, RN, BSN Case Manager

## 2020-12-01 NOTE — Progress Notes (Signed)
Patient arrive to ICU from [place] intubated, levophed and Amiodarone. Team met patient at beside, Pt  Placed on BS monitor, CHG and MRSA performed. Pt with little urine output, bladder scan perform and pt bladder was empty. Patient agitated Precedex ,Fentanyl started and restraints applied.  No belongings accompany with patient.

## 2020-12-01 NOTE — Progress Notes (Signed)
Code called on [address]. Pt intubated by CRNA Roselyn Reef at bedside. Pt manually ventilated to 9Y92 with no complications noted. Alphia Moh RRT given report at bedside.

## 2020-12-01 NOTE — Progress Notes (Signed)
eLink Physician-Brief Progress Note Patient Name: [name] DOB: [birth year] MRN: [id number]   Date of Service  12/01/2020  HPI/Events of Note  Pt. has HD cath not being used for HD.  Per RN, cath is being used as central line & is not heplocked, however, line is not being used because pt. has PIV x4 that are being used.  RN asking if line should be heplocked, & if so, can you please write order  eICU Interventions  Plt is ok. Hep lock ordered     Intervention Category Minor Interventions: Other:  Elmer Sow 12/01/2020, 8:35 PM

## 2020-12-01 NOTE — Progress Notes (Signed)
I was called urgently to the bedside and presented immediately.  Patient was undergoing resuscitative efforts for PEA arrest for flash pulmonary edema.  He underwent coronary angiography yesterday showing nonobstructive CAD but had evidence of increased LV pressures with an LVEDP in the 30s and saturations in the 80s.  Diuresis was performed in the Cath Lab and was recommended ongoing.  We attempted diuresis yesterday evening but urine output was minimal.  Intensification of diuretic therapy was planned for this morning, however he became unresponsive with PEA arrest.  He received CPR, epinephrine.  He was in atrial fibrillation with a rate of 130 bpm, and amiodarone was initiated.  He received a bolus dose of amiodarone and continuous infusion afterward.  His wife was intermittently present at the bedside and was updated by the code team throughout.  Discussions about intubation resulted in plans for short-term intubation if prognosis is favorable between the patient's wife and the anesthesiologist code leader.  He received Lasix during resuscitative efforts with again minimal response in urine output.  I recommended Lasix 80 mg IV Lasix challenge in addition to the 40 mg he had just received, if no response, contact advanced heart failure service for consideration of inotropes.  During resuscitative efforts, the patient was started on norepinephrine.  I attempted to perform a bedside echocardiogram. Stat echocardiogram was also called, and presented quickly.  LVEF is approximately 20%, RV function is moderately decreased with moderate RV enlargement while spontaneously breathing on the mechanical ventilator.  IVC suggests RA pressure of 15 with dilated size and minimal collapse.  RVSP approximately 40 mmHg.  I suspect the patient had begun to poorly tolerate his atrial fibrillation and experience flash pulmonary edema.  On exam heart sounds are distant and there are coarse diffuse crackles.  When his  LVEF was felt to be reduced yesterday diltiazem infusion was stopped and beta-blockade was favored.  He is responding well to IV amiodarone with much better rate control, and this should be continued until he is far more stable.  I updated the patient's wife on the cardiovascular conditions and plan to involve advanced heart failure.  I have contacted their service and they recommend central access for CVP and cooximetry and consideration of inotropic support.  CRITICAL CARE Performed by: Cherlynn Kaiser, MD   Total critical care time: 75 minutes   Critical care time was exclusive of separately billable procedures and treating other patients.   Critical care was necessary to treat or prevent imminent or life-threatening deterioration.   Critical care was time spent personally by me (independent of APPs or residents) on the following activities: development of treatment plan with patient and/or surrogate as well as nursing, discussions with consultants, evaluation of patient's response to treatment, examination of patient, obtaining history from patient or surrogate, ordering and performing treatments and interventions, ordering and review of laboratory studies, ordering and review of radiographic studies, pulse oximetry and re-evaluation of patient's condition.

## 2020-12-01 NOTE — Consult Note (Signed)
NAME:  [name], MRN:  [id number], DOB:  1933/11/06, LOS: 2 ADMISSION DATE:  11/27/2020, CONSULTATION DATE: 12/01/2020 REFERRING MD: Dr. Doristine Bosworth, CHIEF COMPLAINT: Status post PEA cardiac arrest  Brief History:  86 year old diastolic congestive heart failure presented with worsening shortness of breath and substernal chest pain, found to be in A. fib RVR with elevated troponin.  Cardiac cath was done which showed moderately obstructive disease but no intervention was done. On 12/22 patient became short of breath, noted to be hypoxic and went into PEA arrest x2 total CPR time was 5 minutes.  Intubated and was transferred to ICU  History of Present Illness:  86 year old male with chronic diastolic dysfunction, moderate aortic stenosis diabetes and hypertension who initially presented with worsening shortness of breath and substernal chest pain.  In the emergency department initially was noted to be in A. fib with RVR with heart rate ranging in 140s, he had elevated troponin and BNP, cardiology was consulted, patient underwent cardiac catheterization which showed moderate coronary artery disease, no intervention was done recommend medical management. On 12/22 patient went into A. fib RVR, followed by that he became short of breath and hypoxic that led to PEA cardiac arrest, CPR was done per ACLS protocol, ROSC was achieved after 1 round of CPR and epinephrine, patient was found to be confused postcode so decision was made to intubate, post intubation patient went into PEA arrest again, ROSC was achieved after 1 round of CPR and 1 epi.  He was transferred to ICU for further care.  Past Medical History:  Chronic diastolic heart failure Coronary artery disease Diabetes type 2 Hypertension Chronic neuropathy   Significant Hospital Events:  12/22 PEA cardiac arrest, admitted to ICU  Consults:  PCCM Cardiology  Procedures:  12/22 ETT 12/22 right IJ TLC  Significant Diagnostic Tests:   12/22 CT head: No acute or reversible finding. 12/22: CXR: RIGHT pleural effusion and bibasilar atelectasis.  Micro Data:  12/22 MRSA PCR positive 12/20 Covid/influenza PCR negative  Antimicrobials:  N/A  Interim History / Subjective:    Objective   Blood pressure 97/79, pulse 93, temperature 98.4 F (36.9 C), temperature source Axillary, resp. rate (!) 30, height _0  (1.778 m), weight 85.9 kg, SpO2 94 %.    Vent Mode: PRVC FiO2 (%):  [90 %-100 %] 90 % Set Rate:  [20 bmp-30 bmp] 30 bmp Vt Set:  [580 mL] 580 mL PEEP:  [5 cmH20-8 cmH20] 8 cmH20 Plateau Pressure:  [18 cmH20-24 cmH20] 24 cmH20   Intake/Output Summary (Last 24 hours) at 12/01/2020 1356 Last data filed at 12/01/2020 1300 Gross per 24 hour  Intake 558.14 ml  Output 1980 ml  Net -1421.86 ml   Filed Weights   11/10/2020 1616 11/20/2020 0438  Weight: 82.6 kg 85.9 kg    Examination:   Physical exam: General: Chronically ill-appearing male, orally intubated HEENT: [place]/AT, eyes anicteric.  ETT and OGT in place Neuro: Opens eyes with vocal stimuli, intermittently following simple commands, moving all 4 extremities spontaneously Chest: Bilateral crackles, no wheezes or rhonchi Heart: Irregularly irregular, no murmurs or gallops Abdomen: Soft, nontender, nondistended, bowel sounds present Skin: No rash  Resolved Hospital Problem list     Assessment & Plan:  Status post PEA cardiac arrest likely due to hypoxemia from flash pulmonary edema Paroxysmal A. fib with RVR Acute on chronic systolic/diastolic congestive heart failure Pulmonary edema Non-ST elevation MI versus demand cardiac ischemia Acute hypoxic respiratory failure Diabetes type 2 Hyperkalemia Acute kidney injury  Continue aggressive diuresis for heart failure team Patient may require inotropic therapy, defer decision to cardiology His echocardiogram showed EF 20% Patient's heart rate is better controlled now after amiodarone infusion Started  on IV heparin infusion for a stroke prophylaxis due to high CHA2DS2-VASc score Patient serum troponins were elevated, he underwent cardiac cath which showed moderately obstructive disease, no intervention was performed Continue aspirin and atorvastatin Continue lung protective ventilation He can plateau pressure are at goal Continue IV Precedex and as needed fentanyl with RASS goal 0/-1 Monitor fingerstick with a goal 140-180 Closely monitor electrolytes Patient serum creatinine started trending up likely due to prerenal from cardiorenal syndrome Probably he will be started on inotropic and diuretic therapy Closely monitor serum creatinine  Best practice (evaluated daily)  Diet: NPO, tube feeds Pain/Anxiety/Delirium protocol (if indicated): Precedex/as needed fentanyl VAP protocol (if indicated): Yes DVT prophylaxis: IV heparin GI prophylaxis: Protonix Glucose control: Basal and bolus insulin Mobility: Bedrest Disposition: ICU  Goals of Care:  Last date of multidisciplinary goals of care discussion: 12/22 Family and staff present: Patient's wife, MD and RN Summary of discussion: Continue full aggressive care Follow up goals of care discussion due: 12/29 Code Status: Full  Labs   CBC: Recent Labs  Lab 11/20/2020 1620 11/28/2020 2326 11/17/2020 0215 11/19/2020 1052 12/01/20 0249 12/01/20 0958  WBC 8.1 7.7 7.6  --  12.7*  --   NEUTROABS  --  4.2  --   --   --   --   HGB 11.0* 10.6* 10.3* 11.6* 11.6* 11.6*  HCT 36.9* 35.2* 33.9* 34.0* 36.9* 34.0*  MCV 100.5* 100.0 98.3  --  98.9  --   PLT 207 193 189  --  239  --     Basic Metabolic Panel: Recent Labs  Lab 12/01/2020 1620 11/22/2020 0215 11/12/2020 1052 12/01/20 0249 12/01/20 0958  NA 138  --  140 140 139  K 4.5  --  4.5 5.5* 4.8  CL 104  --   --  105  --   CO2 24  --   --  22  --   GLUCOSE 231*  --   --  217*  --   BUN 29*  --   --  36*  --   CREATININE 1.10  --   --  1.48*  --   CALCIUM 9.4  --   --  9.4  --   MG 1.6*  1.8  --   --   --    GFR: Estimated Creatinine Clearance: 37.7 mL/min (A) (by C-G formula based on SCr of 1.48 mg/dL (H)). Recent Labs  Lab 12/02/2020 1620 12/01/2020 2326 12/10/2020 0215 12/01/20 0249  WBC 8.1 7.7 7.6 12.7*    Liver Function Tests: Recent Labs  Lab 11/20/2020 1620 12/01/20 0249  AST 25 29  ALT 22 23  ALKPHOS 34* 40  BILITOT 1.0 1.0  PROT 6.4* 6.5  ALBUMIN 3.7 3.7   No results for input(s): LIPASE, AMYLASE in the last 168 hours. No results for input(s): AMMONIA in the last 168 hours.  ABG    Component Value Date/Time   PHART 7.269 (L) 12/01/2020 0958   PCO2ART 42.9 12/01/2020 0958   PO2ART 77 (L) 12/01/2020 0958   HCO3 19.8 (L) 12/01/2020 0958   TCO2 21 (L) 12/01/2020 0958   ACIDBASEDEF 7.0 (H) 12/01/2020 0958   O2SAT 94.0 12/01/2020 0958     Coagulation Profile: Recent Labs  Lab 12/06/2020 0215  INR 1.2    Cardiac Enzymes:  No results for input(s): CKTOTAL, CKMB, CKMBINDEX, TROPONINI in the last 168 hours.  HbA1C: Hgb A1c MFr Bld  Date/Time Value Ref Range Status  12/08/2020 11:26 PM 7.0 (H) 4.8 - 5.6 % Final    Comment:    (NOTE) Pre diabetes:          5.7%-6.4%  Diabetes:              >6.4%  Glycemic control for   <7.0% adults with diabetes   09/14/2020 07:14 AM 6.8 (H) <5.7 % of total Hgb Final    Comment:    For someone without known diabetes, a hemoglobin A1c value of 6.5% or greater indicates that they may have  diabetes and this should be confirmed with a follow-up  test. . For someone with known diabetes, a value <7% indicates  that their diabetes is well controlled and a value  greater than or equal to 7% indicates suboptimal  control. A1c targets should be individualized based on  duration of diabetes, age, comorbid conditions, and  other considerations. . Currently, no consensus exists regarding use of hemoglobin A1c for diagnosis of diabetes for children. .     CBG: Recent Labs  Lab 11/25/2020 1619 12/09/2020 2110  12/01/20 0607 12/01/20 0955 12/01/20 1131  GLUCAP 211* 250* 167* 266* 260*    Review of Systems:   Unable to perform review of system as patient is intubated  Past Medical History:  He,  has a past medical history of Anemia, Atherosclerotic PVD with intermittent claudication, Chronic diastolic congestive heart failure (11/23/2014), DDD (degenerative disc disease), cervical (66/29/4765), Diastolic dysfunction, Diverticulosis, DVT (deep venous thrombosis), Edema (08/19/2014), GERD (gastroesophageal reflux disease), History of pneumonia (2002), History of skin cancer (07/30/2019), Hyperlipidemia associated with type 2 diabetes mellitus (05/03/2017), Hypertension associated with type 2 diabetes mellitus (08/20/2007), IBS (irritable bowel syndrome), Mild neurocognitive disorder (07/07/2020), Moderate aortic stenosis, Multiple lacunar infarcts, Non-obstructive CAD (coronary artery disease), OAB (overactive bladder) (03/26/2015), Osteoarthritis, Peripheral neuropathy associated with type 2 diabetes mellitus (05/30/2010), PVC's (premature ventricular contractions), Shingles, Type 2 diabetes mellitus with long-term current use of insulin (05/03/2017), and Varicose veins of lower extremities with other complications (46/50/3546).   Surgical History:   Past Surgical History:  Procedure Laterality Date  . BACK SURGERY    . CATARACT EXTRACTION Bilateral 08/10/2015,08/15/2015  . COLONOSCOPY     Hx: of  . CTS bilateral    . KNEE ARTHROSCOPY  1988, 1992   bilateral  . LEFT AND RIGHT HEART CATHETERIZATION WITH CORONARY ANGIOGRAM N/A 09/03/2014   Procedure: LEFT AND RIGHT HEART CATHETERIZATION WITH CORONARY ANGIOGRAM;  Surgeon: [name], MD;  Location: [facility] CATH LAB;  Service: Cardiovascular;  Laterality: N/A;  . LEFT HEART CATH AND CORONARY ANGIOGRAPHY N/A 11/19/2020   Procedure: LEFT HEART CATH AND CORONARY ANGIOGRAPHY;  Surgeon: Jettie Booze, MD;  Location: [facility] CV LAB;  Service:  Cardiovascular;  Laterality: N/A;  . LUMBAR LAMINECTOMY/DECOMPRESSION MICRODISCECTOMY N/A 08/18/2013   Procedure: Lumbar two-three, lumbar three-four, lumbar four-five decompressive laminectomies;  Surgeon: [name], MD;  Location: [facility];  Service: Neurosurgery;  Laterality: N/A;  . TONSILLECTOMY    . TOTAL KNEE ARTHROPLASTY Bilateral 2005, 2007     Social History:   reports that he has never smoked. He has never used smokeless tobacco. He reports that he does not drink alcohol and does not use drugs.   Family History:  His family history includes Dementia in his father; Heart disease in his mother; Hyperlipidemia in  his son; Hypertension in his mother; Other (age of onset: 57) in his mother.   Allergies Allergies  Allergen Reactions  . Ciprofloxacin Other (See Comments)    "made me have a weird feeling- felt disoriented.."  . Sulfamethoxazole Rash     Home Medications  Prior to Admission medications   Medication Sig Start Date End Date Taking? Authorizing Provider  acetaminophen (TYLENOL) 650 MG CR tablet Take 1,300 mg by mouth 3 (three) times daily.   Yes [provider]  amLODipine (NORVASC) 5 MG tablet TAKE ONE TABLET BY MOUTH DAILY Patient taking differently: Take 5 mg by mouth daily. 09/06/20  Yes Koberlein, Steele Berg, MD  aspirin 81 MG tablet Take 81 mg by mouth at bedtime.   Yes [provider]  donepezil (ARICEPT) 5 MG tablet Take 1/2 tablet daily for 2 weeks, then increase to 1 tablet daily Patient taking differently: Take 5 mg by mouth in the morning. 06/24/20  Yes Cameron Sprang, MD  ferrous sulfate 325 (65 FE) MG tablet Take 325 mg by mouth every other day.    Yes [provider]  furosemide (LASIX) 40 MG tablet TAKE ONE TABLET BY MOUTH DAILY Patient taking differently: Take 40 mg by mouth in the morning. 11/11/20  Yes Koberlein, Steele Berg, MD  HUMALOG KWIKPEN 100 UNIT/ML KwikPen INJECT 6 TO 10 UNITS INTO THE SKIN BEFORE EACH MEAL  THREE TIMES A DAY Patient taking differently: Inject 6-10 Units into the skin 3 (three) times daily before meals. 04/16/20  Yes Philemon Kingdom, MD  insulin detemir (LEVEMIR FLEXTOUCH) 100 UNIT/ML FlexPen INJECT 20 UNITS INTO THE SKIN DAILY AT 10PM Patient taking differently: Inject 14-16 Units into the skin See admin instructions. Inject 14-16 units into the skin at 10 PM 09/06/20  Yes Philemon Kingdom, MD  Magnesium 200 MG TABS Take 1 tablet (200 mg total) by mouth daily. 12/17/17  Yes Lendon Colonel, NP  metFORMIN (GLUCOPHAGE) 1000 MG tablet TAKE ONE TABLET BY MOUTH TWICE A DAY WITH MEALS Patient taking differently: Take 1,000 mg by mouth 2 (two) times daily with a meal. 11/11/20  Yes Philemon Kingdom, MD  metoprolol succinate (TOPROL XL) 25 MG 24 hr tablet Take 0.5 tablets (12.5 mg total) by mouth daily. Patient taking differently: Take 12.5 mg by mouth at bedtime. 09/30/20  Yes Lelon Perla, MD  Multiple Vitamins-Minerals (CENTRUM SILVER PO) Take 1 tablet by mouth daily.    Yes [provider]  NON FORMULARY Place 1-2 sprays into both nostrils See admin instructions. [employer] Simply Saline Nasal Relief- Instill 1-2 sprays into each nostril one to two times a day   Yes [provider]  omeprazole (PRILOSEC) 20 MG capsule TAKE ONE CAPSULE BY MOUTH DAILY Patient taking differently: Take 20 mg by mouth daily before breakfast. 08/30/20  Yes Koberlein, Junell C, MD  pravastatin (PRAVACHOL) 40 MG tablet TAKE ONE TABLET BY MOUTH EVERY EVENING Patient taking differently: Take 40 mg by mouth every evening. 12/26/19  Yes Lelon Perla, MD  Probiotic Product (PROBIOTIC PO) Take 1 capsule by mouth 3 (three) times a week.   Yes [provider]  Propylene Glycol 0.6 % SOLN Place 2 drops into both eyes See admin instructions. Instill 2 drops into both eyes one to two times a day   Yes [provider]  sennosides-docusate sodium (SENOKOT-S) 8.6-50 MG tablet Take  1 tablet by mouth in the morning and at bedtime.   Yes [provider]  vitamin  B-12 (CYANOCOBALAMIN) 1000 MCG tablet Take 1,000 mcg by mouth every other day.   Yes [provider]  Accu-Chek Softclix Lancets lancets Use to check blood sugar 5 times a day. 03/30/20   Philemon Kingdom, MD  B-D UF III MINI PEN NEEDLES 31G X 5 MM MISC USE TO TEST BLOOD SUGAR FOUR TIMES A DAY Patient taking differently: 4 (four) times daily. 12/01/19   Philemon Kingdom, MD  Blood Glucose Monitoring Suppl (ACCU-CHEK GUIDE) w/Device KIT 1 kit by Does not apply route See admin instructions. Check blood sugar 3 times a day 01/26/20   Philemon Kingdom, MD  glucose blood (ACCU-CHEK GUIDE) test strip Use to check blood sugar 3 times a day 03/19/20   Philemon Kingdom, MD     Total critical care time: 48 minutes  Performed by: [place] care time was exclusive of separately billable procedures and treating other patients.   Critical care was necessary to treat or prevent imminent or life-threatening deterioration.   Critical care was time spent personally by me on the following activities: development of treatment plan with patient and/or surrogate as well as nursing, discussions with consultants, evaluation of patient's response to treatment, examination of patient, obtaining history from patient or surrogate, ordering and performing treatments and interventions, ordering and review of laboratory studies, ordering and review of radiographic studies, pulse oximetry and re-evaluation of patient's condition.   Jacky Kindle MD [facility] Pager: [phone] Mobile: [phone]

## 2020-12-01 NOTE — Progress Notes (Signed)
[facility] Progress Note Patient Name: [name] DOB: December 15, 1932 MRN: [id number]   Date of Service  12/01/2020  HPI/Events of Note  ABG: resp alkalosis.   eICU Interventions  - Vt decrease to 70ml/ibw, rate down to 22. Follow ABG in 30 mins. Discussed with RT and orders placed.      Intervention Category Intermediate Interventions: Diagnostic test evaluation  Elmer Sow 12/01/2020, 8:53 PM

## 2020-12-01 NOTE — Progress Notes (Signed)
Inpatient Diabetes Program Recommendations  AACE/ADA: New Consensus Statement on Inpatient Glycemic Control (2015)  Target Ranges:  Prepandial:   less than 140 mg/dL      Peak postprandial:   less than 180 mg/dL (1-2 hours)      Critically ill patients:  140 - 180 mg/dL   Lab Results  Component Value Date   GLUCAP 260 (H) 12/01/2020   HGBA1C 7.0 (H) 12/03/2020    Review of Glycemic Control Results for JAQUAVIS, FELMLEE (MRN [id number]) as of 12/01/2020 13:06  Ref. Range 11/10/2020 21:10 12/01/2020 06:07 12/01/2020 09:55 12/01/2020 11:31  Glucose-Capillary Latest Ref Range: 70 - 99 mg/dL 250 (H) 167 (H) 266 (H) 260 (H)   Diabetes history: Type 2 DM Outpatient Diabetes medications: Levemir 14-16 units QHS, Metformin 1000 mg BID, Humalog 6-10 units TID Current orders for Inpatient glycemic control: Levemir 10 units BID, Novolog 0-9 units TID, Novolog 0-5 units QHS  Inpatient Diabetes Program Recommendations:    Noted intubation, transfer to 74M.   Consider switching correction to Novolog 2-6 units Q4H under ICU protocol for glycemic control.   Thanks, Bronson Curb, MSN, RNC-OB Diabetes Coordinator [phone] (8a-5p)

## 2020-12-02 DIAGNOSIS — E440 Moderate protein-calorie malnutrition: Secondary | ICD-10-CM

## 2020-12-02 DIAGNOSIS — I2510 Atherosclerotic heart disease of native coronary artery without angina pectoris: Secondary | ICD-10-CM | POA: Diagnosis not present

## 2020-12-02 DIAGNOSIS — I4891 Unspecified atrial fibrillation: Secondary | ICD-10-CM | POA: Diagnosis not present

## 2020-12-02 DIAGNOSIS — I5043 Acute on chronic combined systolic (congestive) and diastolic (congestive) heart failure: Secondary | ICD-10-CM | POA: Diagnosis not present

## 2020-12-02 DIAGNOSIS — J9602 Acute respiratory failure with hypercapnia: Secondary | ICD-10-CM | POA: Diagnosis not present

## 2020-12-02 LAB — BASIC METABOLIC PANEL
Anion gap: 11 (ref 5–15)
BUN: 38 mg/dL — ABNORMAL HIGH (ref 8–23)
CO2: 27 mmol/L (ref 22–32)
Calcium: 8.7 mg/dL — ABNORMAL LOW (ref 8.9–10.3)
Chloride: 104 mmol/L (ref 98–111)
Creatinine, Ser: 1.47 mg/dL — ABNORMAL HIGH (ref 0.61–1.24)
GFR, Estimated: 46 mL/min — ABNORMAL LOW (ref >60)
Glucose, Bld: 121 mg/dL — ABNORMAL HIGH (ref 70–99)
Potassium: 3.7 mmol/L (ref 3.5–5.1)
Sodium: 142 mmol/L (ref 135–145)

## 2020-12-02 LAB — GLUCOSE, CAPILLARY
Glucose-Capillary: 106 mg/dL — ABNORMAL HIGH (ref 70–99)
Glucose-Capillary: 118 mg/dL — ABNORMAL HIGH (ref 70–99)
Glucose-Capillary: 143 mg/dL — ABNORMAL HIGH (ref 70–99)
Glucose-Capillary: 182 mg/dL — ABNORMAL HIGH (ref 70–99)
Glucose-Capillary: 188 mg/dL — ABNORMAL HIGH (ref 70–99)
Glucose-Capillary: 276 mg/dL — ABNORMAL HIGH (ref 70–99)

## 2020-12-02 LAB — CBC
HCT: 32.6 % — ABNORMAL LOW (ref 39.0–52.0)
Hemoglobin: 10.8 g/dL — ABNORMAL LOW (ref 13.0–17.0)
MCH: 31.2 pg (ref 26.0–34.0)
MCHC: 33.1 g/dL (ref 30.0–36.0)
MCV: 94.2 fL (ref 80.0–100.0)
Platelets: 173 10*3/uL (ref 150–400)
RBC: 3.46 MIL/uL — ABNORMAL LOW (ref 4.22–5.81)
RDW: 14.6 % (ref 11.5–15.5)
WBC: 9 10*3/uL (ref 4.0–10.5)
nRBC: 0 % (ref 0.0–0.2)

## 2020-12-02 LAB — COOXEMETRY PANEL
Carboxyhemoglobin: 0.5 % (ref 0.5–1.5)
Methemoglobin: 0.8 % (ref 0.0–1.5)
O2 Saturation: 61.7 %
Total hemoglobin: 11.1 g/dL — ABNORMAL LOW (ref 12.0–16.0)

## 2020-12-02 LAB — HEPARIN LEVEL (UNFRACTIONATED): Heparin Unfractionated: 1.44 IU/mL — ABNORMAL HIGH (ref 0.30–0.70)

## 2020-12-02 LAB — MAGNESIUM: Magnesium: 2.2 mg/dL (ref 1.7–2.4)

## 2020-12-02 LAB — TRIGLYCERIDES: Triglycerides: 54 mg/dL (ref <150)

## 2020-12-02 LAB — APTT: aPTT: 82 seconds — ABNORMAL HIGH (ref 24–36)

## 2020-12-02 LAB — PHOSPHORUS: Phosphorus: 4.6 mg/dL (ref 2.5–4.6)

## 2020-12-02 MED ORDER — FUROSEMIDE 10 MG/ML IJ SOLN
60.0000 mg | Freq: Once | INTRAMUSCULAR | Status: AC
  Administered 2020-12-02: 21:00:00 60 mg via INTRAVENOUS

## 2020-12-02 MED ORDER — SODIUM CHLORIDE 0.9% FLUSH: 10.0000 mL | INTRAVENOUS | Status: DC | PRN

## 2020-12-02 MED ORDER — VITAL AF 1.2 CAL PO LIQD
1000.0000 mL | ORAL | Status: DC
  Administered 2020-12-02 – 2020-12-05 (×4): 1000 mL
  Filled 2020-12-02 (×2): qty 1000

## 2020-12-02 MED ORDER — INSULIN ASPART 100 UNIT/ML ~~LOC~~ SOLN
2.0000 [IU] | SUBCUTANEOUS | Status: DC
  Administered 2020-12-03: 6 [IU] via SUBCUTANEOUS
  Administered 2020-12-03: 20:00:00 4 [IU] via SUBCUTANEOUS
  Administered 2020-12-03: 05:00:00 6 [IU] via SUBCUTANEOUS
  Administered 2020-12-04: 13:00:00 2 [IU] via SUBCUTANEOUS

## 2020-12-02 MED ORDER — MAGNESIUM SULFATE 2 GM/50ML IV SOLN
2.0000 g | Freq: Once | INTRAVENOUS | Status: AC
  Administered 2020-12-02: 13:00:00 2 g via INTRAVENOUS
  Filled 2020-12-02: qty 50

## 2020-12-02 MED ORDER — DOBUTAMINE IN D5W 4-5 MG/ML-% IV SOLN
2.5000 ug/kg/min | INTRAVENOUS | Status: DC
  Administered 2020-12-02: 2.5 ug/kg/min via INTRAVENOUS
  Filled 2020-12-02 (×2): qty 250

## 2020-12-02 MED ORDER — DONEPEZIL HCL 5 MG PO TABS
5.0000 mg | ORAL_TABLET | Freq: Every morning | ORAL | Status: DC
  Administered 2020-12-03 – 2020-12-05 (×3): 5 mg
  Filled 2020-12-02 (×3): qty 1

## 2020-12-02 MED ORDER — MIDAZOLAM HCL 2 MG/2ML IJ SOLN
1.0000 mg | INTRAMUSCULAR | Status: DC | PRN
  Administered 2020-12-02 (×5): 2 mg via INTRAVENOUS
  Filled 2020-12-02 (×2): qty 2

## 2020-12-02 MED ORDER — MIDAZOLAM HCL 2 MG/2ML IJ SOLN
INTRAMUSCULAR | Status: AC
  Filled 2020-12-02: qty 2

## 2020-12-02 MED ORDER — MIDAZOLAM HCL 2 MG/2ML IJ SOLN
1.0000 mg | INTRAMUSCULAR | Status: DC | PRN
  Filled 2020-12-02 (×2): qty 2

## 2020-12-02 MED ORDER — SODIUM CHLORIDE 0.9% FLUSH
10.0000 mL | Freq: Two times a day (BID) | INTRAVENOUS | Status: DC
  Administered 2020-12-02: 09:00:00 20 mL
  Administered 2020-12-02 – 2020-12-03 (×3): 10 mL
  Administered 2020-12-04: 22:00:00 20 mL

## 2020-12-02 MED ORDER — PROPOFOL 1000 MG/100ML IV EMUL
5.0000 ug/kg/min | INTRAVENOUS | Status: DC
  Administered 2020-12-02: 20:00:00 20 ug/kg/min via INTRAVENOUS
  Administered 2020-12-03: 18:00:00 30 ug/kg/min via INTRAVENOUS
  Administered 2020-12-03: 04:00:00 27 ug/kg/min via INTRAVENOUS
  Administered 2020-12-03: 12:00:00 40 ug/kg/min via INTRAVENOUS
  Administered 2020-12-04: 06:00:00 30 ug/kg/min via INTRAVENOUS
  Administered 2020-12-04: 17:00:00 20 ug/kg/min via INTRAVENOUS
  Administered 2020-12-04 (×2): 30 ug/kg/min via INTRAVENOUS
  Filled 2020-12-02 (×6): qty 100
  Filled 2020-12-02: qty 200
  Filled 2020-12-02: qty 100

## 2020-12-02 MED ORDER — POTASSIUM CHLORIDE 20 MEQ PO PACK
40.0000 meq | PACK | Freq: Once | ORAL | Status: AC
  Administered 2020-12-02: 08:00:00 40 meq
  Filled 2020-12-02: qty 2

## 2020-12-02 NOTE — Progress Notes (Signed)
[place] for Heparin Indication: atrial fibrillation  Allergies  Allergen Reactions  . Ciprofloxacin Other (See Comments)    "made me have a weird feeling- felt disoriented.."  . Sulfamethoxazole Rash    Patient Measurements: Height: 5\' 10"  (177.8 cm) Weight: 86.1 kg (189 lb 13.1 oz) IBW/kg (Calculated) : 73 Heparin Dosing Weight: 82.6 kg   Vital Signs: Temp: 97.7 F (36.5 C) (12/23 0338) Temp Source: Axillary (12/23 0338) BP: 118/74 (12/23 0100) Pulse Rate: 114 (12/23 0100)  Labs: Recent Labs    12/10/2020 1620 11/12/2020 1810 11/22/2020 2326 December 21, 2020 0215 12-21-20 0740 2020/12/21 1052 2020/12/21 1151 12/01/20 0249 12/01/20 0958 12/01/20 1954 12/01/20 2040 12/01/20 2151 12/02/20 0351  HGB 11.0*  --  10.6* 10.3*  --    < >  --  11.6*   < >  --  10.9* 11.2* 10.8*  HCT 36.9*  --  35.2* 33.9*  --    < >  --  36.9*   < >  --  32.0* 33.0* 32.6*  PLT 207  --  193 189  --   --   --  239  --   --   --   --  173  APTT  --   --   --   --   --   --   --   --   --  59*  --   --  82*  LABPROT  --   --   --  14.4  --   --   --   --   --   --   --   --   --   INR  --   --   --  1.2  --   --   --   --   --   --   --   --   --   HEPARINUNFRC  --   --   --  <0.10*  --   --   --   --   --   --   --   --   --   CREATININE 1.10  --   --   --   --   --   --  1.48*  --   --   --   --   --   TROPONINIHS 809*   < > 966*  --  1,014*  --  945*  --   --   --   --   --   --    < > = values in this interval not displayed.    Estimated Creatinine Clearance: 37.7 mL/min (A) (by C-G formula based on SCr of 1.48 mg/dL (H)).  Assessment: 11 YOM with new onset Afib with RVR - was started on apixaban 2020-12-21 PM.  S/p cardiac arrest and IV heparin resumed.  PTT therapeutic (82 sec) on gtt at 1300 units/hr. No bleeding noted. Heparin level still in process - likely elevated due to apixaban.  Goal of Therapy:  Heparin level 0.3-0.7 units/ml Monitor platelets by  anticoagulation protocol: Yes   Plan:  Continue heparin gtt at 1300 units/hr Daily heparin level, PTT, and CBC  Sherlon Handing, PharmD, BCPS Please see amion for complete clinical pharmacist phone list 12/02/2020, 5:33 AM

## 2020-12-02 NOTE — Progress Notes (Addendum)
Advanced Heart Failure Rounding Note  PCP-Cardiologist: Kirk Ruths, MD   Subjective:    Intubated and sedated.   On Dobutamine 2.5. Off NE. Co-ox 62%  Remains in afib w/ CVR in the 70-80s w/ PVCs and brief runs of NSVT. On amio gtt at 30/hr  Improved UOP w/ inotropic support. -3.7L out yesterday. CVP 11 toady. SCr stable 1.10>>1.48>>1.47. K 3.7     Objective:   Weight Range: 86.1 kg Body mass index is 27.24 kg/m.   Vital Signs:   Temp:  [97.7 F (36.5 C)-101.4 F (38.6 C)] 98.7 F (37.1 C) (12/23 1139) Pulse Rate:  [53-135] 85 (12/23 1200) Resp:  [15-34] 15 (12/23 1200) BP: (85-137)/(62-101) 94/66 (12/23 1200) SpO2:  [90 %-100 %] 100 % (12/23 1214) FiO2 (%):  [40 %-70 %] 40 % (12/23 1214) Weight:  [86.1 kg] 86.1 kg (12/23 0500) Last BM Date: 12/01/20 (per sbar report)  Weight change: Filed Weights   12/01/2020 1616 11/16/2020 0438 12/02/20 0500  Weight: 82.6 kg 85.9 kg 86.1 kg    Intake/Output:   Intake/Output Summary (Last 24 hours) at 12/02/2020 1303 Last data filed at 12/02/2020 1200 Gross per 24 hour  Intake 1816.59 ml  Output 3585 ml  Net -1768.41 ml      Physical Exam    CVP 11  General:  Intubated and sedated. No resp difficulty HEENT: Normal Neck: Supple. JVP 10 cm . Carotids 2+ bilat; no bruits. No lymphadenopathy or thyromegaly appreciated. Cor: PMI nondisplaced. irregularly irregular rhythm  No rubs, gallops or murmurs. Lungs: intubated and course BS bilaterally  Abdomen: Soft, nontender, nondistended. No hepatosplenomegaly. No bruits or masses. Good bowel sounds. Extremities: No cyanosis, clubbing, rash, edema, LEs cool to touch  Neuro: intubated and sedated   Telemetry   Afib 70s-80s. PVCs/NSVT (3-4 beats)  EKG    No new EKG to review   Labs    CBC Recent Labs    11/15/2020 2326 11/27/2020 0215 12/01/20 0249 12/01/20 0958 12/01/20 2151 12/02/20 0351  WBC 7.7   < > 12.7*  --   --  9.0  NEUTROABS 4.2  --   --   --   --    --   HGB 10.6*   < > 11.6*   < > 11.2* 10.8*  HCT 35.2*   < > 36.9*   < > 33.0* 32.6*  MCV 100.0   < > 98.9  --   --  94.2  PLT 193   < > 239  --   --  173   < > = values in this interval not displayed.   Basic Metabolic Panel Recent Labs    11/11/2020 1620 11/18/2020 0215 11/13/2020 1052 12/01/20 0249 12/01/20 0958 12/01/20 2151 12/02/20 0351  NA 138  --    < > 140   < > 143 142  K 4.5  --    < > 5.5*   < > 3.6 3.7  CL 104  --   --  105  --   --  104  CO2 24  --   --  22  --   --  27  GLUCOSE 231*  --   --  217*  --   --  121*  BUN 29*  --   --  36*  --   --  38*  CREATININE 1.10  --   --  1.48*  --   --  1.47*  CALCIUM 9.4  --   --  9.4  --   --  8.7*  MG 1.6* 1.8  --   --   --   --   --    < > = values in this interval not displayed.   Liver Function Tests Recent Labs    11/11/2020 1620 12/01/20 0249  AST 25 29  ALT 22 23  ALKPHOS 34* 40  BILITOT 1.0 1.0  PROT 6.4* 6.5  ALBUMIN 3.7 3.7   No results for input(s): LIPASE, AMYLASE in the last 72 hours. Cardiac Enzymes No results for input(s): CKTOTAL, CKMB, CKMBINDEX, TROPONINI in the last 72 hours.  BNP: BNP (last 3 results) Recent Labs    11/13/2020 1626  BNP 975.6*    ProBNP (last 3 results) No results for input(s): PROBNP in the last 8760 hours.   D-Dimer No results for input(s): DDIMER in the last 72 hours. Hemoglobin A1C Recent Labs    11/14/2020 2326  HGBA1C 7.0*   Fasting Lipid Panel Recent Labs    11/28/2020 2326  TRIG 43   Thyroid Function Tests Recent Labs    11/28/2020 2101  TSH 1.370    Other results:   Imaging    DG CHEST PORT 1 VIEW  Result Date: 12/01/2020 CLINICAL DATA:  Acute respiratory failure with hypercapnia. EXAM: PORTABLE CHEST 1 VIEW COMPARISON:  12/01/2020 at 11:06 a.m. FINDINGS: An endotracheal tube terminates approximately 3 cm above the carina. Enteric tube courses into the upper abdomen with tip not imaged. A new right jugular catheter terminates over the mid SVC. The  cardiac silhouette is upper limits of normal in size. Interstitial and hazy airspace opacities bilaterally are unchanged and greatest in the lung bases. A right pleural effusion is also unchanged. No pneumothorax is identified. IMPRESSION: 1. New right jugular catheter as above. 2. Unchanged bilateral lung opacities which could reflect edema or infection with a right pleural effusion. Electronically Signed   By: Logan Bores M.D.   On: 12/01/2020 14:49      Medications:     Scheduled Medications: . aspirin  81 mg Per Tube QHS  . chlorhexidine gluconate (MEDLINE KIT)  15 mL Mouth Rinse BID  . Chlorhexidine Gluconate Cloth  6 each Topical Daily  . docusate  100 mg Per Tube BID  . [START ON 12/03/2020] donepezil  5 mg Per Tube q AM  . fentaNYL (SUBLIMAZE) injection  25 mcg Intravenous Once  . ferrous sulfate  220 mg Per Tube QODAY  . furosemide  60 mg Intravenous BID  . heparin lock flush  500 Units Intravenous Once  . insulin aspart  0-5 Units Subcutaneous QHS  . insulin aspart  0-9 Units Subcutaneous TID WC  . insulin detemir  10 Units Subcutaneous BID  . mouth rinse  15 mL Mouth Rinse 10 times per day  . mupirocin ointment  1 application Nasal BID  . pantoprazole sodium  40 mg Per Tube Daily  . polyethylene glycol  17 g Per Tube Daily  . pravastatin  40 mg Per Tube QPM  . senna-docusate  1 tablet Per Tube BID  . sodium chloride flush  10-40 mL Intracatheter Q12H  . sodium chloride flush  3 mL Intravenous Q12H  . sodium chloride flush  3 mL Intravenous Q12H     Infusions: . sodium chloride    . sodium chloride 250 mL (12/02/20 1022)  . amiodarone 30 mg/hr (12/02/20 0800)  . dexmedetomidine (PRECEDEX) IV infusion 1 mcg/kg/hr (12/02/20 1251)  . DOBUTamine 2.5 mcg/kg/min (12/02/20 1247)  .  fentaNYL infusion INTRAVENOUS 200 mcg/hr (12/02/20 0800)  . heparin 1,300 Units/hr (12/02/20 0800)  . magnesium sulfate bolus IVPB 2 g (12/02/20 1259)  . norepinephrine (LEVOPHED) Adult  infusion Stopped (12/01/20 1826)     PRN Medications:  sodium chloride, acetaminophen, fentaNYL, midazolam, midazolam, ondansetron (ZOFRAN) IV, sodium chloride flush, sodium chloride flush    Assessment/Plan   1. Acute Biventricular Heart Failure - Echo 2015: EF 60-65%, G1DD, mild LVH, RV normal - Echo 10/2020: EF 55-60%, GIIDD, RV normal  - Limited Echo 01-02-21: EF now 20% RV moderately reduced. - Dobutamine started 12/22 for low output  - Based on timeline, suspect tachymediated CM secondary to rapid atrial fibrillation and frequent PVCs - Cath shows mild nonobstructive CAD  - Control Afib/PVCs w/ Amiodarone gtt - Will need eventual TEE/DCCV if no chemical conversion  - Co-ox 62% today. Continue DBA 2.5  - CVP 11. Continue IV Lasix 60 mg bid  - BP too soft for GDMT currently.   - Given h/o bilateral CTS, lumbar stenosis, peripheral neuropathy, thicken AV, low voltage QRS and conduction disease, ? underlying cardiac amyloid. Once stabilized, will check cMRI / PYP scan - Multiple myeloma panel and urine immunofixation in process     2. Atrial Fibrillation  - newly diagnosed, rates in 140s on admit - TSH WNL  - suspect cause of CM. Need to keep in NSR. Rate now in the 70-80s - continue amiodarone gtt at 30 hr. Monitor closely while on DBA - ? blocker discontinued in setting of acute decompensated HF/ cardiogenic shock  - keep K > 4.0 and Mg >2.0  - continue heparin gtt  - plan TEE/DCCV if no chemical conversion   3. AKI  - baseline SCr 0.9 - 1.10 on admit, 1.47 today  - suspect cardiorenal  - continue diuresis and inotropes   4. Moderate- Severe MR  - mild MR noted on echo 11/21 (EF normal) - now mod-severe on Limited echo 12/21. EF now 20%, c/w functional MR secondary to severe LV dysfunction - continue diuresis   5. Aortic Stenosis  - mild- moderate by echo 11/21, mean gradient 17 mmHg. LVOT/AV VTI ratio: 0.31  6. PEA Arrest 12/01/20 - s/p resuscitation w/  3 min CPR   7. Acute Hypoxic Respiratory Failure  - in setting or PEA arrest and acute pulmonary edema and cardiogenic shock - continue diuresis - vent management per PCCM   8. Elevated Hs Troponin  - Hs peaked at 1,041>>945 - cath w/ no significant CAD - demand ischemia from rapid afib and acute CHF   9. Iron Deficiency Anemia  - Hgb 10.3. Fe 35. Tsat 10% Ferritin 22  - on PO iron  - recommend holding off on feraheme infusion for now, in case of cMRI   10. T2DM DM  - Hgb A1c 7.0  - management per primary team   11. Peripheral Neuropathy  - h/o bilateral carpal tunnel + bilateral LE neuropathy  - w/u for amyloid per above      Length of Stay: [address], PA-C  12/02/2020, 1:03 PM  Advanced Heart Failure Team Pager [phone] (M-F; 7a - 4p)  Please contact [facility] for night-coverage after hours (4p -7a ) and weekends on [email or web address]  Agree with above  Remains sedated on vent. Gets agitated when awake but will follow some commands. Remains in AF. On IV amio. Also on dobutamine and heparin. Co-ox ok. CVP 11. Diuresing.   General:  Elderly male sedated on vent  HEENT: normal +ETT Neck: supple. no JVP to jaw . Carotids 2+ bilat; no bruits. No lymphadenopathy or thryomegaly appreciated. Cor: PMI nondisplaced. Irregular. Lungs: coarse Abdomen: soft, nontender, nondistended. No hepatosplenomegaly. No bruits or masses. Good bowel sounds. Extremities: no cyanosis, clubbing, rash, 1+ edema Neuro: sedated on vent  Remains tenuous. Continue vent support for now. Continue amio, heparin and dobutamine. Continue IV diuresis. Likely TEE/DC-CV tomorrow (hold TFs at [facility]). D/w wife ate bedside and daughter by phone.    CRITICAL CARE Performed by: Glori Bickers  Total critical care time: 35 minutes  Critical care time was exclusive of separately billable procedures and treating other patients.  Critical care was necessary to treat or prevent imminent or  life-threatening deterioration.  Critical care was time spent personally by me (independent of midlevel providers or residents) on the following activities: development of treatment plan with patient and/or surrogate as well as nursing, discussions with consultants, evaluation of patient's response to treatment, examination of patient, obtaining history from patient or surrogate, ordering and performing treatments and interventions, ordering and review of laboratory studies, ordering and review of radiographic studies, pulse oximetry and re-evaluation of patient's condition.  Glori Bickers, MD  5:23 PM

## 2020-12-02 NOTE — Progress Notes (Addendum)
Progress Note  Patient Name: [name] Date of Encounter: 12/02/2020  Primary Cardiologist: Kirk Ruths, MD   Subjective   Made DNR yesterday by wife, who has a good understanding of the situation and events yesterday.   Current making urine, responding well to dobutamine. Norepi was stopped yesterday evening.   He remains intubated and sedated on the ventilator. Rates in afib are better controlled but he is having increased ectopy and NSVT.  Inpatient Medications    Scheduled Meds: . aspirin  81 mg Per Tube QHS  . chlorhexidine gluconate (MEDLINE KIT)  15 mL Mouth Rinse BID  . Chlorhexidine Gluconate Cloth  6 each Topical Daily  . docusate  100 mg Per Tube BID  . [START ON 12/03/2020] donepezil  5 mg Per Tube q AM  . fentaNYL (SUBLIMAZE) injection  25 mcg Intravenous Once  . ferrous sulfate  220 mg Per Tube QODAY  . furosemide  60 mg Intravenous BID  . heparin lock flush  500 Units Intravenous Once  . insulin aspart  0-5 Units Subcutaneous QHS  . insulin aspart  0-9 Units Subcutaneous TID WC  . insulin detemir  10 Units Subcutaneous BID  . mouth rinse  15 mL Mouth Rinse 10 times per day  . midazolam      . mupirocin ointment  1 application Nasal BID  . pantoprazole sodium  40 mg Per Tube Daily  . polyethylene glycol  17 g Per Tube Daily  . pravastatin  40 mg Per Tube QPM  . senna-docusate  1 tablet Per Tube BID  . sodium chloride flush  10-40 mL Intracatheter Q12H  . sodium chloride flush  3 mL Intravenous Q12H  . sodium chloride flush  3 mL Intravenous Q12H   Continuous Infusions: . sodium chloride    . sodium chloride 250 mL (12/02/20 1022)  . amiodarone 30 mg/hr (12/02/20 0800)  . dexmedetomidine (PRECEDEX) IV infusion 1.2 mcg/kg/hr (12/02/20 0800)  . DOBUTamine    . fentaNYL infusion INTRAVENOUS 200 mcg/hr (12/02/20 0800)  . heparin 1,300 Units/hr (12/02/20 0800)  . magnesium sulfate bolus IVPB    . norepinephrine (LEVOPHED) Adult infusion Stopped  (12/01/20 1826)   PRN Meds: sodium chloride, acetaminophen, fentaNYL, midazolam, midazolam, ondansetron (ZOFRAN) IV, sodium chloride flush, sodium chloride flush   Vital Signs    Vitals:   12/02/20 1053 12/02/20 1139 12/02/20 1200 12/02/20 1214  BP:   94/66   Pulse:   85   Resp:   15   Temp: 100.2 F (37.9 C) 98.7 F (37.1 C)    TempSrc: Oral Axillary    SpO2:   94% 100%  Weight:      Height:        Intake/Output Summary (Last 24 hours) at 12/02/2020 1235 Last data filed at 12/02/2020 0900 Gross per 24 hour  Intake 1894.59 ml  Output 3235 ml  Net -1340.41 ml   Filed Weights   11/23/2020 1616 11/19/2020 0438 12/02/20 0500  Weight: 82.6 kg 85.9 kg 86.1 kg    Telemetry    afib with frequent ventricular ectopy and NSVT - Personally Reviewed  ECG    No new - Personally Reviewed  Physical Exam   GEN: intubated and sedated.   Neck: No JVD Cardiac: irregular rhythm, normal rate Respiratory: diffuse rhonchi anteriorly GI: Soft, nontender, non-distended  MS: No edema; No deformity. Neuro:  intubated and sedated  Psych: intubated and sedated.  Labs    Chemistry Recent Labs  Lab 11/14/2020 1620  11/18/2020 1052 12/01/20 0249 12/01/20 0958 12/01/20 2040 12/01/20 2151 12/02/20 0351  NA 138   < > 140   < > 139 143 142  K 4.5   < > 5.5*   < > 3.5 3.6 3.7  CL 104  --  105  --   --   --  104  CO2 24  --  22  --   --   --  27  GLUCOSE 231*  --  217*  --   --   --  121*  BUN 29*  --  36*  --   --   --  38*  CREATININE 1.10  --  1.48*  --   --   --  1.47*  CALCIUM 9.4  --  9.4  --   --   --  8.7*  PROT 6.4*  --  6.5  --   --   --   --   ALBUMIN 3.7  --  3.7  --   --   --   --   AST 25  --  29  --   --   --   --   ALT 22  --  23  --   --   --   --   ALKPHOS 34*  --  40  --   --   --   --   BILITOT 1.0  --  1.0  --   --   --   --   GFRNONAA >60  --  46*  --   --   --  46*  ANIONGAP 10  --  13  --   --   --  11   < > = values in this interval not displayed.      Hematology Recent Labs  Lab 12/02/2020 0215 12/04/2020 1052 12/01/20 0249 12/01/20 0958 12/01/20 2040 12/01/20 2151 12/02/20 0351  WBC 7.6  --  12.7*  --   --   --  9.0  RBC 3.45*  3.44*  --  3.73*  --   --   --  3.46*  HGB 10.3*   < > 11.6*   < > 10.9* 11.2* 10.8*  HCT 33.9*   < > 36.9*   < > 32.0* 33.0* 32.6*  MCV 98.3  --  98.9  --   --   --  94.2  MCH 29.9  --  31.1  --   --   --  31.2  MCHC 30.4  --  31.4  --   --   --  33.1  RDW 14.6  --  14.8  --   --   --  14.6  PLT 189  --  239  --   --   --  173   < > = values in this interval not displayed.    Cardiac EnzymesNo results for input(s): TROPONINI in the last 168 hours. No results for input(s): TROPIPOC in the last 168 hours.   BNP Recent Labs  Lab 11/20/2020 1626  BNP 975.6*     DDimer No results for input(s): DDIMER in the last 168 hours.   Radiology    DG Abd 1 View  Result Date: 12/01/2020 CLINICAL DATA:  Nasogastric tube and endotracheal tube placement EXAM: ABDOMEN - 1 VIEW COMPARISON:  Portable exam 1109 hours LEFT priors for comparison FINDINGS: Tip of endotracheal tube projects 2.7 cm above carina. Nasogastric tube projects over stomach. External pacing leads project over lower LEFT  chest. Bibasilar atelectasis and small RIGHT pleural effusion. Gas in stomach and:. IMPRESSION: Nasogastric tube tip projects over stomach. Electronically Signed   By: Lavonia Dana M.D.   On: 12/01/2020 11:23   CT HEAD WO CONTRAST  Result Date: 12/01/2020 CLINICAL DATA:  Mental status change with unknown cause EXAM: CT HEAD WITHOUT CONTRAST TECHNIQUE: Contiguous axial images were obtained from the base of the skull through the vertex without intravenous contrast. COMPARISON:  Brain MRI 03/31/2020 FINDINGS: Brain: No evidence of acute infarction, hemorrhage, hydrocephalus, extra-axial collection or mass lesion/mass effect. Mild cerebral volume loss and chronic small vessel ischemic low-density in the white matter Vascular: No  hyperdense vessel or unexpected calcification. Skull: Negative for fracture or focal lesion. Retro dental ligamentous thickening and calcification which contacts the ventral cervicomedullary junction. Sinuses/Orbits: No acute finding. IMPRESSION: No acute or reversible finding. Electronically Signed   By: Monte Fantasia M.D.   On: 12/01/2020 06:35   DG CHEST PORT 1 VIEW  Result Date: 12/01/2020 CLINICAL DATA:  Acute respiratory failure with hypercapnia. EXAM: PORTABLE CHEST 1 VIEW COMPARISON:  12/01/2020 at 11:06 a.m. FINDINGS: An endotracheal tube terminates approximately 3 cm above the carina. Enteric tube courses into the upper abdomen with tip not imaged. A new right jugular catheter terminates over the mid SVC. The cardiac silhouette is upper limits of normal in size. Interstitial and hazy airspace opacities bilaterally are unchanged and greatest in the lung bases. A right pleural effusion is also unchanged. No pneumothorax is identified. IMPRESSION: 1. New right jugular catheter as above. 2. Unchanged bilateral lung opacities which could reflect edema or infection with a right pleural effusion. Electronically Signed   By: Logan Bores M.D.   On: 12/01/2020 14:49   DG CHEST PORT 1 VIEW  Result Date: 12/01/2020 CLINICAL DATA:  Type II diabetes mellitus, hypertension, nasogastric tube placement EXAM: PORTABLE CHEST 1 VIEW COMPARISON:  Portable exam 1106 hours compared to 0756 hours FINDINGS: Tip of endotracheal tube projects 3.0 cm above carina. Nasogastric tube extends into stomach. External pacing leads project over chest. Upper normal heart size with slight vascular congestion. Atherosclerotic calcification aorta. RIGHT pleural effusion and bibasilar atelectasis. No pneumothorax. Endplate spur formation thoracic spine. IMPRESSION: RIGHT pleural effusion and bibasilar atelectasis. Nasogastric tube extends into stomach. Electronically Signed   By: Lavonia Dana M.D.   On: 12/01/2020 11:22   DG CHEST  PORT 1 VIEW  Result Date: 12/01/2020 CLINICAL DATA:  Hypoxia.  New onset confusion EXAM: PORTABLE CHEST 1 VIEW COMPARISON:  Two days ago FINDINGS: Increased hazy opacity of the right chest, usually layering pleural fluid. Underlying pulmonary opacity which is both interstitial and airspace and seen on both sides. Cardiomegaly. No visible pneumothorax. IMPRESSION: 1. Increased right chest opacification, likely layering pleural fluid. 2. Diffuse pulmonary opacity that could be edema or infection. Electronically Signed   By: Monte Fantasia M.D.   On: 12/01/2020 08:32   ECHOCARDIOGRAM LIMITED  Result Date: 12/01/2020    ECHOCARDIOGRAM LIMITED REPORT   Patient Name:   [name] [facility] Date of Exam: 12/01/2020 Medical Rec #:  [id number]      Height:       70.0 in Accession #:    [id number]     Weight:       189.4 lb Date of Birth:  11-Apr-1933     BSA:          2.039 m Patient Age:    36 years       BP:  132/96 mmHg Patient Gender: M              HR:           130 bpm. Exam Location:  Inpatient Procedure: Limited Echo, Limited Color Doppler and Cardiac Doppler Indications:    CHF-Acute Systolic V76.16  History:        Patient has prior history of Echocardiogram examinations, most                 recent 10/26/2020. CHF, CAD, Aortic Valve Disease,                 Arrythmias:PVCs/NSVT and Atrial Fibrillation; Risk                 Factors:Diabetes and Hypertension. Non-ST elevation MI versus                 demand ischemia,                 Macrocytic anemia.  Sonographer:    Darlina Sicilian RDCS Referring Phys: [id number] Darreld Mclean  Sonographer Comments: Echo performed with patient supine and on artificial respirator. IMPRESSIONS  1. Bedside echocardiogram post PEA arrest, performed STAT.  2. Left ventricular ejection fraction, by estimation, is 20%. The left ventricle has severely decreased function. Best preserved function in the lateral wall. There is mild left ventricular hypertrophy.  3. Right  ventricular systolic function is moderately reduced. The right ventricular size is moderately enlarged. There is mildly elevated pulmonary artery systolic pressure. The estimated right ventricular systolic pressure is 26.9 mmHg.  4. Large pleural effusion.  5. The mitral valve is degenerative. Moderate to severe mitral valve regurgitation, likely secondary MR due to LV dysfunction.  6. The aortic valve is abnormal. There is severe calcifcation of the aortic valve. Gradient not assessed on this focused study.  7. The inferior vena cava is dilated in size with <50% respiratory variability, suggesting right atrial pressure of 15 mmHg. FINDINGS  Left Ventricle: Left ventricular ejection fraction, by estimation, is 20%. The left ventricle has severely decreased function. The left ventricular internal cavity size was normal in size. There is mild left ventricular hypertrophy. Right Ventricle: The right ventricular size is moderately enlarged. Right ventricular systolic function is moderately reduced. There is mildly elevated pulmonary artery systolic pressure. The tricuspid regurgitant velocity is 2.40 m/s, and with an assumed right atrial pressure of 15 mmHg, the estimated right ventricular systolic pressure is 48.5 mmHg. Pericardium: Large pleural effusion. Mitral Valve: The mitral valve is degenerative in appearance. Moderate to severe mitral valve regurgitation. Aortic Valve: The aortic valve is abnormal. There is severe calcifcation of the aortic valve. Aortic valve mean gradient measures 9.0 mmHg. Aortic valve peak gradient measures 16.2 mmHg. Venous: The inferior vena cava is dilated in size with less than 50% respiratory variability, suggesting right atrial pressure of 15 mmHg. LEFT VENTRICLE PLAX 2D LVIDd:         5.10 cm LVIDs:         4.50 cm LV PW:         1.20 cm LV IVS:        1.20 cm  LV Volumes (MOD) LV vol d, MOD A2C: 124.0 ml LV vol d, MOD A4C: 137.0 ml LV vol s, MOD A2C: 99.4 ml LV vol s, MOD A4C: 111.0  ml LV SV MOD A2C:     24.6 ml LV SV MOD A4C:     137.0 ml LV SV MOD BP:  30.9 ml LEFT ATRIUM         Index LA diam:    4.60 cm 2.26 cm/m  AORTIC VALVE AV Vmax:      201.00 cm/s AV Vmean:     131.000 cm/s AV VTI:       0.316 m AV Peak Grad: 16.2 mmHg AV Mean Grad: 9.0 mmHg TRICUSPID VALVE TR Peak grad:   23.0 mmHg TR Vmax:        240.00 cm/s Cherlynn Kaiser MD Electronically signed by Cherlynn Kaiser MD Signature Date/Time: 2020/12/24/10:26:47 AM    Final     Cardiac Studies   Limited echocardiogram 24-Dec-2020 showed LVEF 20%, moderate RV dysfunction moderate RV enlargement, mildly increased RVSP, large pleural effusion.  Moderate severe functional MR.  Prior echo 10/26/2020-normal LV function LVEF 55 to 56% grade 2 diastolic dysfunction with E/E prime of 16, mild left atrial dilation, mild MR, mild-moderate aortic valve stenosis with a gradient of 17 mmHg and valve area of 1.12 cm2.  Patient Profile     86 y.o. male with a history of moderate nonobstructive CAD, hypertension, hyperlipidemia, PVCs, grade 2 diastolic dysfunction, prior lacunar infarcts, diabetes mellitus type 2, and remote DVT.  Initially admitted for new onset atrial fibrillation with rapid ventricular response and chest pain with elevated troponin.  Repeat coronary angiography showed no obstructive CAD, but concern for acute systolic heart failure, likely tachycardia mediated.  Patient had poor response to diuretics, and experience flash pulmonary edema with PEA arrest.  Subsequently EF was noted to be 20% with moderate RV dysfunction and moderate to severe functional mitral regurgitation.  Currently intubated in the ICU, sedated, on dobutamine with appropriate response and now producing adequate urine output.  Rates are controlled on amiodarone infusion.  Assessment & Plan   Principal Problem:   Atrial fibrillation with rapid ventricular response (HCC) Active Problems:   CAD (coronary artery disease)   Hyperlipidemia  associated with type 2 diabetes mellitus   CHF exacerbation (HCC)   NSVT (nonsustained ventricular tachycardia) (HCC)   Elevated troponin   Neuro: intubated and sedated. Initial concerns for stroke like symptoms 11/13/2020, head CT negative. Has not yet undergone MRI due to PEA arrest and ICU transfer. Per PCCM.  CV: Remains in atrial fibrillation the rates are better controlled with IV amiodarone.  For acute systolic heart failure that is likely tachycardia mediated cardiomyopathy with resultant flash pulmonary edema and cardiorenal physiology, recommend continued diuresis and continue dobutamine.  Norepinephrine stopped yesterday evening.  He is having frequent ectopy and nonsustained VT.  I will empirically replete his magnesium today which was low on 12/09/2020 at 1.8.  Given good response to dobutamine, would continue cautiously at this time.  If he has more frequent episodes of nonsustained VT, can consider alternative options. Cr: 1.47 up from baseline of 0.9-1.1 UOP yesterday: After initiation of norepinephrine and dobutamine, urine output was 2.35 L yesterday and is 2 L today.  He is net negative for the admission 2.66 L Weight: 86.1 kg, admission weight 82.6 kg Diuretic plan: Lasix 60 mg IV twice daily, continue.  Pulm: Remains intubated, after PEA arrest.  Pink frothy secretions at time of intubation during code.  Recommend continued aggressive diuresis as tolerated to optimize respiratory status and facilitate extubation, hopefully in the next day or 2.  Renal: As above, likely from poor cardiac output, with initiation of norepinephrine and dobutamine urine output has been appropriate.  Continue diuresis and hemodynamic support with dobutamine.  CRITICAL CARE Performed by: Nadean Corwin  Margaretann Loveless, MD   Total critical care time: 35 minutes   Critical care time was exclusive of separately billable procedures and treating other patients.   Critical care was necessary to treat or prevent  imminent or life-threatening deterioration.   Critical care was time spent personally by me (independent of APPs or residents) on the following activities: development of treatment plan with patient and/or surrogate as well as nursing, discussions with consultants, evaluation of patient's response to treatment, examination of patient, obtaining history from patient or surrogate, ordering and performing treatments and interventions, ordering and review of laboratory studies, ordering and review of radiographic studies, pulse oximetry and re-evaluation of patient's condition.         For questions or updates, please contact [place] Please consult [email or web address] for contact info under        Signed, Elouise Munroe, MD  12/02/2020, 12:35 PM

## 2020-12-02 NOTE — Progress Notes (Signed)
Assisted tele visit to patient with family member.  Josehua Hammar R, RN  

## 2020-12-02 NOTE — Progress Notes (Signed)
   12/02/20 1600  Clinical Encounter Type  Visited With Patient and family together;Health care provider  Visit Type Spiritual support  Referral From Nurse  Consult/Referral To Chaplain  Spiritual Encounters  Spiritual Needs Prayer;Emotional  Stress Factors  Patient Stress Factors Major life changes  Family Stress Factors Health changes;Major life changes  Chaplain returned to patient's room answering a call from his nurse. Patient is not better than yesterday. Wife is very frustrated that he is sedated and when he wakes, he seems to be having some kind of seizures. He looks afraid. She is exhausted but will not leave him during the day. The son and his family live near and daughter is here from [facility] for Christmas. Chaplain offered emotional support and an ear. Chaplain advised that Chaplains are here 24/7 for her.

## 2020-12-02 NOTE — Progress Notes (Signed)
NAME:  [name], MRN:  [id number], DOB:  [birth year], LOS: 3 ADMISSION DATE:  11/21/2020, CONSULTATION DATE: 12/01/2020 REFERRING MD: Dr. Doristine Bosworth, CHIEF COMPLAINT: Status post PEA cardiac arrest  Brief History:  86 year old diastolic congestive heart failure presented with worsening shortness of breath and substernal chest pain, found to be in A. fib RVR with elevated troponin.  Cardiac cath was done which showed moderately obstructive disease but no intervention was done. On 12/22 patient became short of breath, noted to be hypoxic and went into PEA arrest x2 total CPR time was 5 minutes.  Intubated and was transferred to ICU  History of Present Illness:  86 year old male with chronic diastolic dysfunction, moderate aortic stenosis diabetes and hypertension who initially presented with worsening shortness of breath and substernal chest pain.  In the emergency department initially was noted to be in A. fib with RVR with heart rate ranging in 140s, he had elevated troponin and BNP, cardiology was consulted, patient underwent cardiac catheterization which showed moderate coronary artery disease, no intervention was done recommend medical management. On 12/22 patient went into A. fib RVR, followed by that he became short of breath and hypoxic that led to PEA cardiac arrest, CPR was done per ACLS protocol, ROSC was achieved after 1 round of CPR and epinephrine, patient was found to be confused postcode so decision was made to intubate, post intubation patient went into PEA arrest again, ROSC was achieved after 1 round of CPR and 1 epi.  He was transferred to ICU for further care.  Past Medical History:  Chronic diastolic heart failure Coronary artery disease Diabetes type 2 Hypertension Chronic neuropathy   Significant Hospital Events:  12/22 PEA cardiac arrest, admitted to ICU  Consults:  PCCM Cardiology  Procedures:  12/22 ETT 12/22 right IJ TLC  Significant Diagnostic Tests:   12/22 CT head: No acute or reversible finding. 12/22: CXR: RIGHT pleural effusion and bibasilar atelectasis.  Micro Data:  12/22 MRSA PCR positive 12/20 Covid/influenza PCR negative  Antimicrobials:  N/A  Interim History / Subjective:  Resp alkalosis overnight  Objective   Blood pressure 112/86, pulse (!) 102, temperature 98.6 F (37 C), temperature source Axillary, resp. rate 15, height 5\' 10"  (1.778 m), weight 86.1 kg, SpO2 97 %. CVP:  [6 mmHg-8 mmHg] 7 mmHg  Vent Mode: PRVC FiO2 (%):  [40 %-90 %] 40 % Set Rate:  [15 bmp-30 bmp] 15 bmp Vt Set:  [500 mL-580 mL] 500 mL PEEP:  [8 cmH20] 8 cmH20 Plateau Pressure:  [10 cmH20-24 cmH20] 10 cmH20   Intake/Output Summary (Last 24 hours) at 12/02/2020 0950 Last data filed at 12/02/2020 0900 Gross per 24 hour  Intake 1993.51 ml  Output 4165 ml  Net -2171.49 ml   Filed Weights   12/03/2020 1616 December 16, 2020 0438 12/02/20 0500  Weight: 82.6 kg 85.9 kg 86.1 kg    Examination:   Physical exam: General: Chronically ill-appearing male, orally intubated HEENT: /AT, eyes anicteric.  ETT and OGT in place Neuro: RASS -2, Opens eyes to loud voice  Chest: Bilateral crackles, no wheezes or rhonchi Heart: Irregularly irregular, no murmurs or gallops Abdomen: Soft, nontender, nondistended, bowel sounds present Skin: No rash  Resolved Hospital Problem list     Assessment & Plan:  PEA arrest - likely r/t hypoxemia in setting flash pulmonary edema  EF 20% NSTEMI Acute on chronic diastolic CHF PAF PLAN -  Continue aggressive diuresis for heart failure team Dobutamine  Continue amiodarone  Continue IV heparin infusion for  AFib and stroke prophylaxis due to high CHA2DS2-VASc score Patient serum troponins were elevated, he underwent cardiac cath which showed moderately obstructive disease, no intervention was performed Continue aspirin and atorvastatin  Acute respiratory failure - post cardiac arrest as above  Pulmonary edema   Respiratory alkalosis  PLAN -  Vent support - 8cc/kg  F/u CXR  F/u ABG  AKI - ?cardiorenal  Hyperkalemia  PLAN -  Diuresis as above  Continue dobutamine  F/u chem   DM PLAN -  SSI   Best practice (evaluated daily)  Diet: NPO, tube feeds Pain/Anxiety/Delirium protocol (if indicated): Precedex/as needed fentanyl VAP protocol (if indicated): Yes DVT prophylaxis: IV heparin GI prophylaxis: Protonix Glucose control: Basal and bolus insulin Mobility: Bedrest Disposition: ICU Wife updated at bedside 12/23  Goals of Care:  Last date of multidisciplinary goals of care discussion: 12/22 Family and staff present: Patient's wife, MD and RN Summary of discussion: Continue full aggressive care Follow up goals of care discussion due: 12/29 Code Status: Full  Labs   CBC: Recent Labs  Lab 11/22/2020 1620 11/19/2020 2326 11/26/2020 0215 11/18/2020 1052 12/01/20 0249 12/01/20 0958 12/01/20 2040 12/01/20 2151 12/02/20 0351  WBC 8.1 7.7 7.6  --  12.7*  --   --   --  9.0  NEUTROABS  --  4.2  --   --   --   --   --   --   --   HGB 11.0* 10.6* 10.3*   < > 11.6* 11.6* 10.9* 11.2* 10.8*  HCT 36.9* 35.2* 33.9*   < > 36.9* 34.0* 32.0* 33.0* 32.6*  MCV 100.5* 100.0 98.3  --  98.9  --   --   --  94.2  PLT 207 193 189  --  239  --   --   --  173   < > = values in this interval not displayed.    Basic Metabolic Panel: Recent Labs  Lab 12/03/2020 1620 12/03/2020 0215 12/03/2020 1052 12/01/20 0249 12/01/20 0958 12/01/20 2040 12/01/20 2151 12/02/20 0351  NA 138  --    < > 140 139 139 143 142  K 4.5  --    < > 5.5* 4.8 3.5 3.6 3.7  CL 104  --   --  105  --   --   --  104  CO2 24  --   --  22  --   --   --  27  GLUCOSE 231*  --   --  217*  --   --   --  121*  BUN 29*  --   --  36*  --   --   --  38*  CREATININE 1.10  --   --  1.48*  --   --   --  1.47*  CALCIUM 9.4  --   --  9.4  --   --   --  8.7*  MG 1.6* 1.8  --   --   --   --   --   --    < > = values in this interval not displayed.    GFR: Estimated Creatinine Clearance: 37.9 mL/min (A) (by C-G formula based on SCr of 1.47 mg/dL (H)). Recent Labs  Lab 12/01/2020 2326 11/18/2020 0215 12/01/20 0249 12/02/20 0351  WBC 7.7 7.6 12.7* 9.0    Liver Function Tests: Recent Labs  Lab 11/11/2020 1620 12/01/20 0249  AST 25 29  ALT 22 23  ALKPHOS 34* 40  BILITOT 1.0  1.0  PROT 6.4* 6.5  ALBUMIN 3.7 3.7   No results for input(s): LIPASE, AMYLASE in the last 168 hours. No results for input(s): AMMONIA in the last 168 hours.  ABG    Component Value Date/Time   PHART 7.559 (H) 12/01/2020 2151   PCO2ART 28.2 (L) 12/01/2020 2151   PO2ART 119 (H) 12/01/2020 2151   HCO3 25.2 12/01/2020 2151   TCO2 26 12/01/2020 2151   ACIDBASEDEF 7.0 (H) 12/01/2020 0958   O2SAT 61.7 12/02/2020 0505     Coagulation Profile: Recent Labs  Lab 12/08/2020 0215  INR 1.2    Cardiac Enzymes: No results for input(s): CKTOTAL, CKMB, CKMBINDEX, TROPONINI in the last 168 hours.  HbA1C: Hgb A1c MFr Bld  Date/Time Value Ref Range Status  11/23/2020 11:26 PM 7.0 (H) 4.8 - 5.6 % Final    Comment:    (NOTE) Pre diabetes:          5.7%-6.4%  Diabetes:              >6.4%  Glycemic control for   <7.0% adults with diabetes   09/14/2020 07:14 AM 6.8 (H) <5.7 % of total Hgb Final    Comment:    For someone without known diabetes, a hemoglobin A1c value of 6.5% or greater indicates that they may have  diabetes and this should be confirmed with a follow-up  test. . For someone with known diabetes, a value <7% indicates  that their diabetes is well controlled and a value  greater than or equal to 7% indicates suboptimal  control. A1c targets should be individualized based on  duration of diabetes, age, comorbid conditions, and  other considerations. . Currently, no consensus exists regarding use of hemoglobin A1c for diagnosis of diabetes for children. .     CBG: Recent Labs  Lab 12/01/20 1536 12/01/20 1936 12/01/20 2322  12/02/20 0329 12/02/20 0756  GLUCAP 237* 150* 118* 106* 188*     Total critical care time:  34 minutes  Nickolas Madrid, NP Pulmonary/Critical Care Medicine  12/02/2020  9:50 AM

## 2020-12-02 NOTE — Progress Notes (Signed)
Initial Nutrition Assessment  DOCUMENTATION CODES:   Non-severe (moderate) malnutrition in context of chronic illness  INTERVENTION:   Initiate tube feeding via OG tube: Vital AF 1.2 at 65 ml/h (1560 ml per day)  Provides 1872 kcal, 117 gm protein, 1265 ml free water daily  NUTRITION DIAGNOSIS:   Moderate Malnutrition related to chronic illness (CHF) as evidenced by mild fat depletion,moderate fat depletion,moderate muscle depletion,severe muscle depletion.  GOAL:   Patient will meet greater than or equal to 90% of their needs  MONITOR:   Vent status,TF tolerance,Labs  REASON FOR ASSESSMENT:   Ventilator,Consult Enteral/tube feeding initiation and management  ASSESSMENT:   86 yo male admitted with A fib with RVR. S/P PEA arrest x 2 on 12/22, total CPR time 5 minutes, required intubation. PMH includes CHF, aortic stenosis, DM, HTN.   Discussed patient in ICU rounds and with RN today. Received MD Consult for TF initiation and management. OG tube in place.  Spoke with patient's wife at bedside. She was hesitant to begin tube feeding as patient has expressed that he does not want tube feedings. Explained that tube feeding would be a short term intervention while patient is on vent support. She was okay with short term tube feedings, but states he would not want to continue aggressive support if he were to cardiac arrest again. He was made DNR this morning.  Patient is currently intubated on ventilator support MV: 7.2 L/min Temp (24hrs), Avg:99.3 F (37.4 C), Min:97.7 F (36.5 C), Max:101.4 F (38.6 C)   Labs reviewed.  CBG: [phone]  Medications reviewed and include colace, ferrous sulfate, Lasix, novolog SSI, levemir, miralax, senokot-s, precedex, fentanyl, levophed, mag sulfate.   Usual weights reviewed. No significant weight changes noted. Wife agrees that patient has not lost any weight recently. He did have a few days PTA where he was unable to chew his foods,  but he was doing okay with soft foods and liquids.   NUTRITION - FOCUSED PHYSICAL EXAM:  Flowsheet Row Most Recent Value  Orbital Region Moderate depletion  Upper Arm Region Moderate depletion  Thoracic and Lumbar Region Mild depletion  Buccal Region Unable to assess  Temple Region Severe depletion  Clavicle Bone Region Moderate depletion  Clavicle and Acromion Bone Region Moderate depletion  Scapular Bone Region Unable to assess  Dorsal Hand Moderate depletion  Patellar Region Moderate depletion  Anterior Thigh Region Moderate depletion  Posterior Calf Region Moderate depletion  Edema (RD Assessment) Mild  Hair Reviewed  Eyes Unable to assess  Mouth Unable to assess  Skin Reviewed  Nails Reviewed       Diet Order:   Diet Order            Diet NPO time specified  Diet effective now                 EDUCATION NEEDS:   No education needs have been identified at this time  Skin:  Skin Assessment: Reviewed RN Assessment  Last BM:  12/22  Height:   Ht Readings from Last 1 Encounters:  12/04/2020 5\' 10"  (1.778 m)    Weight:   Wt Readings from Last 1 Encounters:  12/02/20 86.1 kg    Ideal Body Weight:  75.5 kg  BMI:  Body mass index is 27.24 kg/m.  Estimated Nutritional Needs:   Kcal:  1920  Protein:  105-120 gm  Fluid:  2 L    Lucas Mallow, RD, LDN, CNSC Please refer to Amion for contact information.

## 2020-12-02 NOTE — Progress Notes (Signed)
Pharmacy Electrolyte Replacement  Recent Labs:  Recent Labs    12/19/2020 0215 2020/12/19 1052 12/02/20 0351  K  --    < > 3.7  MG 1.8  --   --   CREATININE  --    < > 1.47*   < > = values in this interval not displayed.    Low Critical Values (K </= 2.5, Phos </= 1, Mg </= 1) Present: None  Plan:  KCl 40 mEq per tube x 1  Barth Kirks, PharmD, BCPS, BCCCP Clinical Pharmacist [phone]  Please check AMION for all [place] numbers  12/02/2020 7:56 AM

## 2020-12-03 ENCOUNTER — Inpatient Hospital Stay (HOSPITAL_COMMUNITY): Payer: Medicare PPO

## 2020-12-03 DIAGNOSIS — I340 Nonrheumatic mitral (valve) insufficiency: Secondary | ICD-10-CM

## 2020-12-03 DIAGNOSIS — I5023 Acute on chronic systolic (congestive) heart failure: Secondary | ICD-10-CM

## 2020-12-03 DIAGNOSIS — I350 Nonrheumatic aortic (valve) stenosis: Secondary | ICD-10-CM

## 2020-12-03 LAB — GLUCOSE, CAPILLARY
Glucose-Capillary: 224 mg/dL — ABNORMAL HIGH (ref 70–99)
Glucose-Capillary: 107 mg/dL — ABNORMAL HIGH (ref 70–99)
Glucose-Capillary: 81 mg/dL (ref 70–99)
Glucose-Capillary: 111 mg/dL — ABNORMAL HIGH (ref 70–99)
Glucose-Capillary: 213 mg/dL — ABNORMAL HIGH (ref 70–99)

## 2020-12-03 LAB — COOXEMETRY PANEL
Carboxyhemoglobin: 0.9 % (ref 0.5–1.5)
Methemoglobin: 1 % (ref 0.0–1.5)
O2 Saturation: 78.8 %
Total hemoglobin: 10.6 g/dL — ABNORMAL LOW (ref 12.0–16.0)

## 2020-12-03 LAB — PHOSPHORUS
Phosphorus: 4.2 mg/dL (ref 2.5–4.6)
Phosphorus: 3.5 mg/dL (ref 2.5–4.6)

## 2020-12-03 LAB — BASIC METABOLIC PANEL
Anion gap: 14 (ref 5–15)
BUN: 40 mg/dL — ABNORMAL HIGH (ref 8–23)
CO2: 23 mmol/L (ref 22–32)
Calcium: 8.4 mg/dL — ABNORMAL LOW (ref 8.9–10.3)
Chloride: 103 mmol/L (ref 98–111)
Creatinine, Ser: 1.57 mg/dL — ABNORMAL HIGH (ref 0.61–1.24)
GFR, Estimated: 43 mL/min — ABNORMAL LOW (ref >60)
Glucose, Bld: 295 mg/dL — ABNORMAL HIGH (ref 70–99)
Potassium: 3.7 mmol/L (ref 3.5–5.1)
Sodium: 140 mmol/L (ref 135–145)

## 2020-12-03 LAB — MAGNESIUM
Magnesium: 2.1 mg/dL (ref 1.7–2.4)
Magnesium: 2 mg/dL (ref 1.7–2.4)
Magnesium: 1.8 mg/dL (ref 1.7–2.4)

## 2020-12-03 LAB — APTT
aPTT: 119 seconds — ABNORMAL HIGH (ref 24–36)
aPTT: 103 seconds — ABNORMAL HIGH (ref 24–36)

## 2020-12-03 LAB — CBC
HCT: 32.3 % — ABNORMAL LOW (ref 39.0–52.0)
Hemoglobin: 10 g/dL — ABNORMAL LOW (ref 13.0–17.0)
MCH: 30.1 pg (ref 26.0–34.0)
MCHC: 31 g/dL (ref 30.0–36.0)
MCV: 97.3 fL (ref 80.0–100.0)
Platelets: 169 10*3/uL (ref 150–400)
RBC: 3.32 MIL/uL — ABNORMAL LOW (ref 4.22–5.81)
RDW: 14.6 % (ref 11.5–15.5)
WBC: 7.5 10*3/uL (ref 4.0–10.5)
nRBC: 0 % (ref 0.0–0.2)

## 2020-12-03 LAB — HEPARIN LEVEL (UNFRACTIONATED)
Heparin Unfractionated: 0.8 IU/mL — ABNORMAL HIGH (ref 0.30–0.70)
Heparin Unfractionated: 0.77 IU/mL — ABNORMAL HIGH (ref 0.30–0.70)

## 2020-12-03 LAB — PROCALCITONIN: Procalcitonin: 1.99 ng/mL

## 2020-12-03 MED ORDER — DEXTROSE 10 % IV SOLN: INTRAVENOUS | Status: DC

## 2020-12-03 MED ORDER — SODIUM CHLORIDE 0.9 % IV SOLN
2.0000 g | Freq: Two times a day (BID) | INTRAVENOUS | Status: DC
  Administered 2020-12-03 – 2020-12-04 (×4): 2 g via INTRAVENOUS
  Filled 2020-12-03 (×4): qty 2

## 2020-12-03 MED ORDER — AMIODARONE LOAD VIA INFUSION
150.0000 mg | Freq: Once | INTRAVENOUS | Status: AC
  Administered 2020-12-03: 14:00:00 150 mg via INTRAVENOUS
  Filled 2020-12-03: qty 83.34

## 2020-12-03 MED ORDER — ACETAMINOPHEN 160 MG/5ML PO SOLN: 650.0000 mg | Freq: Four times a day (QID) | ORAL | Status: DC | PRN

## 2020-12-03 MED ORDER — METOPROLOL TARTRATE 5 MG/5ML IV SOLN
5.0000 mg | Freq: Once | INTRAVENOUS | Status: AC
  Administered 2020-12-03: 09:00:00 5 mg via INTRAVENOUS

## 2020-12-03 MED ORDER — NOREPINEPHRINE 4 MG/250ML-% IV SOLN
5.0000 ug/min | INTRAVENOUS | Status: DC
  Administered 2020-12-03: 10 ug/min via INTRAVENOUS
  Administered 2020-12-04: 11:00:00 12 ug/min via INTRAVENOUS
  Administered 2020-12-04: 05:00:00 11 ug/min via INTRAVENOUS
  Administered 2020-12-04: 31 ug/min via INTRAVENOUS
  Filled 2020-12-03 (×4): qty 250

## 2020-12-03 MED ORDER — VANCOMYCIN HCL 1250 MG/250ML IV SOLN
1250.0000 mg | INTRAVENOUS | Status: DC
  Administered 2020-12-03 – 2020-12-04 (×2): 1250 mg via INTRAVENOUS
  Filled 2020-12-03 (×3): qty 250

## 2020-12-03 MED ORDER — NOREPINEPHRINE 16 MG/250ML-% IV SOLN: 5.0000 ug/min | INTRAVENOUS | Status: DC

## 2020-12-03 MED ORDER — SODIUM CHLORIDE 0.9 % IV SOLN: INTRAVENOUS | Status: DC

## 2020-12-03 MED ORDER — AMIODARONE LOAD VIA INFUSION
150.0000 mg | Freq: Once | INTRAVENOUS | Status: AC
  Administered 2020-12-03: 11:00:00 150 mg via INTRAVENOUS
  Filled 2020-12-03: qty 83.34

## 2020-12-03 MED ORDER — NOREPINEPHRINE 4 MG/250ML-% IV SOLN
INTRAVENOUS | Status: AC
  Administered 2020-12-03: 11:00:00 5 ug/min via INTRAVENOUS
  Filled 2020-12-03: qty 250

## 2020-12-03 MED ORDER — METOPROLOL TARTRATE 5 MG/5ML IV SOLN
INTRAVENOUS | Status: AC
  Filled 2020-12-03: qty 5

## 2020-12-03 MED ORDER — DEXTROSE 10 % IV SOLN: INTRAVENOUS | Status: AC

## 2020-12-03 NOTE — CV Procedure (Signed)
    TRANSESOPHAGEAL ECHOCARDIOGRAM   NAME:  [name]   MRN: [id number] DOB:  [birth year]   ADMIT DATE: December 10, 2020  INDICATIONS:  Atrial fibrillation  PROCEDURE:   Emergent consent as patient intubated and sedated.   The patient was already on the ventilator. After a procedural time-out, the patient was further sedated. We had a great deal of difficulty getting the probe down. With the help of RT we used a glide scope to visualize the upper esophagus and were able to fit the TEE probed down without complication.   COMPLICATIONS:    There were no immediate complications.  FINDINGS:  LEFT VENTRICLE: EF = 20%. Global HK  RIGHT VENTRICLE: Moderate HK  LEFT ATRIUM: Moderate to severely dilated (5.4 cm)  LEFT ATRIAL APPENDAGE: No obvious thrombus.   RIGHT ATRIUM: Dialted  AORTIC VALVE:  Trileaflet. Calcified. Moderate AS. Trivial AI.   MITRAL VALVE:    Normal. Mild MR.   TRICUSPID VALVE: Normal. Mild TR  PULMONIC VALVE: Grossly normal. Trivial PR  INTERATRIAL SEPTUM: No PFO or ASD.  PERICARDIUM: No effusion  DESCENDING AORTA: Mild Plaque  CARDIOVERSION:     Indications:  Atrial Fibrillation  Procedure Details:  Once the TEE was complete, the patient had the defibrillator pads placed in the anterior and posterior position. Once an appropriate level of sedation was achieved, the patient received a single biphasic, synchronized 200J shock without effect. We repositioned the pads and also used manual pressure and attempted 2 more shocks without conversion to sinus rhythm.   Glori Bickers, MD  12:42 PM      [name] 12:38 PM

## 2020-12-03 NOTE — Progress Notes (Signed)
Pharmacy Antibiotic Note  [name] is a 87 y.o. male admitted on 12/04/2020 s/p PEA arrest 1222 with 54min CPR now with pneumonia.Tm 100, wbc wnl and yellow secretions from ET tube - TA in process.  Pharmacy has been consulted for cefepime and vancomycin dosing. Cr 1.6 est crcl 65ml/min  Plan: Cefepime 2mg  q12h Vancomycin 1250mg  IV q24h  Est auc 550  Height: 5\' 10"  (177.8 cm) Weight: 88.3 kg (194 lb 10.7 oz) IBW/kg (Calculated) : 73  Temp (24hrs), Avg:99.6 F (37.6 C), Min:98.7 F (37.1 C), Max:100.3 F (37.9 C)  Recent Labs  Lab 2020/12/04 1620 2020/12/04 2326 11/26/2020 0215 12/01/20 0249 12/02/20 0351 12/03/20 0004 12/03/20 0500  WBC 8.1 7.7 7.6 12.7* 9.0  --  7.5  CREATININE 1.10  --   --  1.48* 1.47* 1.57*  --     Estimated Creatinine Clearance: 38.5 mL/min (A) (by C-G formula based on SCr of 1.57 mg/dL (H)).    Allergies  Allergen Reactions  . Ciprofloxacin Other (See Comments)    "made me have a weird feeling- felt disoriented.."  . Sulfamethoxazole Rash    Antimicrobials this admission:   Dose adjustments this admission:   Microbiology results:   [name] Pharm.D. CPP, BCPS Clinical Pharmacist [phone] 12/03/2020 10:45 AM    Thank you for allowing pharmacy to be a part of this patient's care.  [name] 12/03/2020 10:33 AM

## 2020-12-03 NOTE — Progress Notes (Signed)
Advanced Heart Failure Rounding Note  PCP-Cardiologist: Kirk Ruths, MD   Subjective:    Remains intubated and sedated.   On Dobutamine 2.5. Off NE. Co-ox pending   Agitated overnight and HR went up. Received IV lopressor. Dropped BP.   Remains in afib this am rates in low 100s. SBP 90s. Increased secretions in ETT. Tmax 100.3 CVP 12  Urine ouput falling off. SCr 1.10>>1.48>>1.47 - > 1.57. K 3.7     Objective:   Weight Range: 88.3 kg Body mass index is 27.93 kg/m.   Vital Signs:   Temp:  [98.7 F (37.1 C)-100.3 F (37.9 C)] 99.4 F (37.4 C) (12/24 0814) Pulse Rate:  [72-186] 87 (12/24 0920) Resp:  [13-24] 16 (12/24 0920) BP: (68-129)/(49-109) 68/49 (12/24 0920) SpO2:  [73 %-100 %] 88 % (12/24 0920) FiO2 (%):  [40 %] 40 % (12/24 0800) Weight:  [88.3 kg] 88.3 kg (12/24 0430) Last BM Date: 12/01/20 (per sbar report)  Weight change: Filed Weights   11/20/2020 0438 12/02/20 0500 12/03/20 0430  Weight: 85.9 kg 86.1 kg 88.3 kg    Intake/Output:   Intake/Output Summary (Last 24 hours) at 12/03/2020 1033 Last data filed at 12/03/2020 0911 Gross per 24 hour  Intake 2067.81 ml  Output 3010 ml  Net -942.19 ml      Physical Exam    CVP 12 General:  Intubated and sedated. Will awaken HEENT: normal + ETT Neck: supple. RIJ central line Cor: PMI nondisplaced. Irregular tachy 2/6 AS Lungs: + crackles  Abdomen: soft, nontender, nondistended. No hepatosplenomegaly. No bruits or masses. Good bowel sounds. Extremities: no cyanosis, clubbing, rash,1+ edema Neuro: Intubated and sedated. Will awaken  Telemetry   Afib 100-110. Personally reviewed  Labs    CBC Recent Labs    12/02/20 0351 12/03/20 0500  WBC 9.0 7.5  HGB 10.8* 10.0*  HCT 32.6* 32.3*  MCV 94.2 97.3  PLT 173 096   Basic Metabolic Panel Recent Labs    12/02/20 0351 12/02/20 1309 12/02/20 1309 12/03/20 0004 12/03/20 0500  NA 142  --   --  140  --   K 3.7  --   --  3.7  --   CL 104   --   --  103  --   CO2 27  --   --  23  --   GLUCOSE 121*  --   --  295*  --   BUN 38*  --   --  40*  --   CREATININE 1.47*  --   --  1.57*  --   CALCIUM 8.7*  --   --  8.4*  --   MG  --  2.2   < > 2.1 2.0  PHOS  --  4.6  --   --  4.2   < > = values in this interval not displayed.   Liver Function Tests Recent Labs    12/01/20 0249  AST 29  ALT 23  ALKPHOS 40  BILITOT 1.0  PROT 6.5  ALBUMIN 3.7   No results for input(s): LIPASE, AMYLASE in the last 72 hours. Cardiac Enzymes No results for input(s): CKTOTAL, CKMB, CKMBINDEX, TROPONINI in the last 72 hours.  BNP: BNP (last 3 results) Recent Labs    11/16/2020 1626  BNP 975.6*    ProBNP (last 3 results) No results for input(s): PROBNP in the last 8760 hours.   D-Dimer No results for input(s): DDIMER in the last 72 hours. Hemoglobin A1C No results for  input(s): HGBA1C in the last 72 hours. Fasting Lipid Panel Recent Labs    12/02/20 1727  TRIG 54   Thyroid Function Tests No results for input(s): TSH, T4TOTAL, T3FREE, THYROIDAB in the last 72 hours.  Invalid input(s): FREET3  Other results:   Imaging    No results found.   Medications:     Scheduled Medications: . amiodarone  150 mg Intravenous Once  . aspirin  81 mg Per Tube QHS  . chlorhexidine gluconate (MEDLINE KIT)  15 mL Mouth Rinse BID  . Chlorhexidine Gluconate Cloth  6 each Topical Daily  . docusate  100 mg Per Tube BID  . donepezil  5 mg Per Tube q AM  . fentaNYL (SUBLIMAZE) injection  25 mcg Intravenous Once  . ferrous sulfate  220 mg Per Tube QODAY  . furosemide  60 mg Intravenous BID  . heparin lock flush  500 Units Intravenous Once  . insulin aspart  2-6 Units Subcutaneous Q4H  . insulin detemir  10 Units Subcutaneous BID  . mouth rinse  15 mL Mouth Rinse 10 times per day  . mupirocin ointment  1 application Nasal BID  . pantoprazole sodium  40 mg Per Tube Daily  . polyethylene glycol  17 g Per Tube Daily  . pravastatin  40 mg  Per Tube QPM  . senna-docusate  1 tablet Per Tube BID  . sodium chloride flush  10-40 mL Intracatheter Q12H  . sodium chloride flush  3 mL Intravenous Q12H  . sodium chloride flush  3 mL Intravenous Q12H    Infusions: . sodium chloride    . sodium chloride 10 mL/hr at 12/03/20 0911  . amiodarone 30 mg/hr (12/03/20 0911)  . ceFEPime (MAXIPIME) IV    . dexmedetomidine (PRECEDEX) IV infusion Stopped (12/02/20 2022)  . DOBUTamine 2 mcg/kg/min (12/03/20 0911)  . feeding supplement (VITAL AF 1.2 CAL) Stopped (12/03/20 0013)  . fentaNYL infusion INTRAVENOUS 150 mcg/hr (12/03/20 0911)  . heparin 1,200 Units/hr (12/03/20 0911)  . propofol (DIPRIVAN) infusion 25 mcg/kg/min (12/03/20 0911)  . vancomycin      PRN Medications: sodium chloride, acetaminophen, fentaNYL, midazolam, midazolam, ondansetron (ZOFRAN) IV, sodium chloride flush, sodium chloride flush    Assessment/Plan   1. Acute Biventricular Heart Failure -> cardiogenic shock - Echo 2015: EF 60-65%, G1DD, mild LVH, RV normal - Echo 10/2020: EF 55-60%, GIIDD, RV normal  - Limited Echo January 04, 2021: EF now 20% RV moderately reduced. - Dobutamine started 12/22 for low output  - Based on timeline, suspect tachymediated CM secondary to rapid atrial fibrillation and frequent PVCs - Cath shows mild nonobstructive CAD  - Remains volume overloaded.  - BP soft despite DBA 2.5/ - Will repeat co-ox and place arterial line. Would like to see SBP > 100. May need to add NE to support increased diuresis.  - Plan TEE/DC-CV today   2. Atrial Fibrillation, persistent - newly diagnosed, rates in 140s on admit - suspect cause of CM.  - rates elevated. Increase amio to 60. Bolus now  - continue heparin gtt  - plan TEE/DCCV today  3. AKI  - baseline SCr 0.9 - 1.10 on admit, 1.57 today  - suspect cardiorenal  - adjust inotropes as above  4. Acute hypoxic respiratory failure - initially due to HF then post-arrest - due to HF and now possible  PNA - add vanc/cefipime. Check sputum cx - Continue diuresis - lung u/s 12/24 shows moderate bilateral pleural effusion. Can tap if not improved with diuresis  5. Moderate- Severe MR  - mild MR noted on echo 11/21 (EF normal) - now mod-severe on Limited echo 12/21. EF now 20%, c/w functional MR secondary to severe LV dysfunction  6. Aortic Stenosis  - mild- moderate by echo 11/21, mean gradient 17 mmHg. LVOT/AV VTI ratio: 0.31  7. PEA Arrest 12/01/20 - s/p resuscitation w/ 3 min CPR    8. Elevated Hs Troponin  - Hs peaked at 1,041>>945 - cath w/ no significant CAD - demand ischemia from rapid afib and acute CHF    CRITICAL CARE Performed by: Glori Bickers  Total critical care time: 60 minutes  Critical care time was exclusive of separately billable procedures and treating other patients.  Critical care was necessary to treat or prevent imminent or life-threatening deterioration.  Critical care was time spent personally by me (independent of midlevel providers or residents) on the following activities: development of treatment plan with patient and/or surrogate as well as nursing, discussions with consultants, evaluation of patient's response to treatment, examination of patient, obtaining history from patient or surrogate, ordering and performing treatments and interventions, ordering and review of laboratory studies, ordering and review of radiographic studies, pulse oximetry and re-evaluation of patient's condition.   Length of Stay: [place], MD  12/03/2020, 10:33 AM  Advanced Heart Failure Team Pager [phone] (M-F; 7a - 4p)  Please contact [facility] for night-coverage after hours (4p -7a ) and weekends on [email or web address]

## 2020-12-03 NOTE — Procedures (Signed)
Arterial Catheter Insertion Procedure Note  MACAI SISNEROS  [id number]  1933/01/25  Date:12/03/20  Time:11:17 AM    Provider Performing: Candee Furbish    Procedure: Insertion of Arterial Line [phone]) with US guidance ([id number])   Indication(s) Blood pressure monitoring and/or need for frequent ABGs  Consent Risks of the procedure as well as the alternatives and risks of each were explained to the patient and/or caregiver.  Consent for the procedure was obtained and is signed in the bedside chart  Anesthesia None   Time Out Verified patient identification, verified procedure, site/side was marked, verified correct patient position, special equipment/implants available, medications/allergies/relevant history reviewed, required imaging and test results available.   Sterile Technique Maximal sterile technique including full sterile barrier drape, hand hygiene, sterile gown, sterile gloves, mask, hair covering, sterile ultrasound probe cover (if used).   Procedure Description Area of catheter insertion was cleaned with chlorhexidine and draped in sterile fashion. With real-time ultrasound guidance an arterial catheter was placed into the left radial artery.  Appropriate arterial tracings confirmed on monitor.     Complications/Tolerance None; patient tolerated the procedure well.   EBL Minimal   Specimen(s) None

## 2020-12-03 NOTE — Progress Notes (Signed)
NAME:  [name], MRN:  [id number], DOB:  08-10-1933, LOS: 4 ADMISSION DATE:  11/17/2020, CONSULTATION DATE: 12/01/2020 REFERRING MD: Dr. Doristine Bosworth, CHIEF COMPLAINT: Status post PEA cardiac arrest  Brief History:  86 year old diastolic congestive heart failure presented with worsening shortness of breath and substernal chest pain, found to be in A. fib RVR with elevated troponin.  Cardiac cath was done which showed moderately obstructive disease but no intervention was done. On 12/22 patient became short of breath, noted to be hypoxic and went into PEA arrest x2 total CPR time was 5 minutes.  Intubated and was transferred to ICU  History of Present Illness:  86 year old male with chronic diastolic dysfunction, moderate aortic stenosis diabetes and hypertension who initially presented with worsening shortness of breath and substernal chest pain.  In the emergency department initially was noted to be in A. fib with RVR with heart rate ranging in 140s, he had elevated troponin and BNP, cardiology was consulted, patient underwent cardiac catheterization which showed moderate coronary artery disease, no intervention was done recommend medical management. On 12/22 patient went into A. fib RVR, followed by that he became short of breath and hypoxic that led to PEA cardiac arrest, CPR was done per ACLS protocol, ROSC was achieved after 1 round of CPR and epinephrine, patient was found to be confused postcode so decision was made to intubate, post intubation patient went into PEA arrest again, ROSC was achieved after 1 round of CPR and 1 epi.  He was transferred to ICU for further care.  Past Medical History:  Chronic diastolic heart failure Coronary artery disease Diabetes type 2 Hypertension Chronic neuropathy   Significant Hospital Events:  12/22 PEA cardiac arrest, admitted to ICU  Consults:  PCCM Cardiology  Procedures:  12/22 ETT 12/22 right IJ TLC  Significant Diagnostic Tests:   12/22 CT head: No acute or reversible finding. 12/22: CXR: RIGHT pleural effusion and bibasilar atelectasis.  Micro Data:  12/22 MRSA PCR positive 12/20 Covid/influenza PCR negative  Antimicrobials:  N/A  Interim History / Subjective:  No events, remains on vent, remains Afib, intermitttent RVR Vitals below are wrong: sats mid 90s, 40% fiO2, BP MAP 60s  Objective   Blood pressure (!) 68/49, pulse 87, temperature 99.4 F (37.4 C), temperature source Axillary, resp. rate 16, height 5\' 10"  (1.778 m), weight 88.3 kg, SpO2 (!) 88 %. CVP:  [3 mmHg-12 mmHg] 12 mmHg  Vent Mode: PRVC FiO2 (%):  [40 %] 40 % Set Rate:  [15 bmp] 15 bmp Vt Set:  [500 mL] 500 mL PEEP:  [5 cmH20-8 cmH20] 5 cmH20 Plateau Pressure:  [17 cmH20-20 cmH20] 17 cmH20   Intake/Output Summary (Last 24 hours) at 12/03/2020 1017 Last data filed at 12/03/2020 [id number] Gross per 24 hour  Intake 2067.81 ml  Output 3010 ml  Net -942.19 ml   Filed Weights   11/30/20 0438 12/02/20 0500 12/03/20 0430  Weight: 85.9 kg 86.1 kg 88.3 kg    Examination: Constitutional: elderly man on vent  Eyes: pupils pinpoint, equal Ears, nose, mouth, and throat: ETT with thick green secretions Cardiovascular: Irregular, ext warm Respiratory: Diminished at bases, bilateral moderate effusions with [place] Gastrointestinal: Soft, hypoactive BS Skin: No rashes, normal turgor Neurologic: moves all 4 ext with sedation lightened Psychiatric: RASS -1   Co ox pending Mildly elevated Cr stable CBC stable Fever curve borderline  Resolved Hospital Problem list     Assessment & Plan:  Acute hypoxemic respiratory failure due to pulmonary edema +/- pneumonia -  HCAP coverage, check trach aspirate/Pct  NSTEMI, cardiomyopathy, question afib-induced - TEE/cardioversion today - Continue AC, amio at discretion of CHF team, appreciate help  Shock, cardiac and now question septic with rising temps, increasing secretions AKI related to deompensated  heart failure - Levophed to be added and dobutamine stopped - Monitor SvO2, Uop post cardioversion, avoid ARBs for now  Post brief cardiac arrest thought hypoxemia induced Metabolic encephalopathy related to sedation, less likely hypoxemic brain injury but will need to get him completely off to fully evaluate. - Wean sedation post cardioversion, would hold off extubation until shock improved  DM2- with lower sugars due to NPO status, start low D10, can go back to TF + levemir post cardioversion  Baseline Lewy Body dementia- if does not turn around soon, GOC discussion warranted  Best practice (evaluated daily)  Diet: NPO, tube feeds Pain/Anxiety/Delirium protocol (if indicated): wean after cardioversion to precedex+fentanyl VAP protocol (if indicated): Yes DVT prophylaxis: IV heparin GI prophylaxis: Protonix Glucose control: Basal and bolus insulin Mobility: Bedrest Disposition: ICU Wife updated at bedside 12/23  Goals of Care:  Last date of multidisciplinary goals of care discussion: 12/22 Family and staff present: Patient's wife, MD and RN Summary of discussion: Continue full aggressive care Follow up goals of care discussion due: 12/29 Code Status: Full   Patient critically ill due to shock, respiratory failure Interventions to address this today vent weaning, pressor titration Risk of deterioration without these interventions is high  I personally spent 45 minutes providing critical care not including any separately billable procedures  Erskine Emery MD [facility] 12/03/2020 11:16 AM Personal pager: [phone] If unanswered, please page CCM On-call: [phone]

## 2020-12-03 NOTE — TOC Initial Note (Addendum)
Transition of Care [facility]) - Initial/Assessment Note    Patient Details  Name: [name] MRN: [id number] Date of Birth: 01/22/1933  Transition of Care [facility]) CM/SW Contact:    Verdell Carmine, RN Phone Number: 12/03/2020, 11:06 AM  Clinical Narrative:                 Admitted with atrial fibrillation new onset RVR. Proceeded to arrest.Now with pneumonia, intubated sedated. Heart failure with well preserved EF, will need follow up at CHF clinic,    Will need to be on eliquis. From home wth wife.Patient is a DNR< renal number elevating if patient  Rebounds and progresses  Will likely need rehab bridge to home CM will follow with CSW.  May look at involving palliative care for [place].  Expected Discharge Plan: [place] Barriers to Discharge: Continued Medical Work up   Patient Goals and CMS Choice        Expected Discharge Plan and Services Expected Discharge Plan: Short       Living arrangements for the past 2 months: Single Family Home                                      Prior Living Arrangements/Services Living arrangements for the past 2 months: Single Family Home Lives with:: Spouse Patient language and need for interpreter reviewed:: Yes        Need for Family Participation in Patient Care: Yes (Comment) Care giver support system in place?: Yes (comment)   Criminal Activity/Legal Involvement Pertinent to Current Situation/Hospitalization: No - Comment as needed  Activities of Daily Living Home Assistive Devices/Equipment: Engineer, drilling (specify type) ADL Screening (condition at time of admission) Patient's cognitive ability adequate to safely complete daily activities?: Yes Is the patient deaf or have difficulty hearing?: No Does the patient have difficulty seeing, even when wearing glasses/contacts?: No Does the patient have difficulty concentrating, remembering, or making decisions?: No Patient able to  express need for assistance with ADLs?: No Does the patient have difficulty dressing or bathing?: No Independently performs ADLs?: Yes (appropriate for developmental age) Does the patient have difficulty walking or climbing stairs?: No Weakness of Legs: None Weakness of Arms/Hands: None  Permission Sought/Granted                  Emotional Assessment       Orientation: : Fluctuating Orientation (Suspected and/or reported Sundowners) Alcohol / Substance Use: Not Applicable Psych Involvement: No (comment)  Admission diagnosis:  Atrial fibrillation with rapid ventricular response ([place]) [I48.91] Patient Active Problem List   Diagnosis Date Noted  . Malnutrition of moderate degree 12/02/2020  . Elevated troponin   . Atrial fibrillation with rapid ventricular response ([place]) 11/28/2020  . CHF exacerbation ([place]) 11/17/2020  . NSVT (nonsustained ventricular tachycardia) ([place]) 12/10/2020  . Mild neurocognitive disorder 07/07/2020  . Multiple lacunar infarcts   . History of skin cancer 07/30/2019  . DDD (degenerative disc disease), cervical 08/02/2017  . Type 2 diabetes mellitus with long-term current use of insulin 05/03/2017  . Hyperlipidemia associated with type 2 diabetes mellitus 05/03/2017  . OAB (overactive bladder) 03/26/2015  . Chronic diastolic congestive heart failure 11/23/2014  . CAD (coronary artery disease) 10/14/2014  . Peripheral neuropathy associated with type 2 diabetes mellitus 05/30/2010  . Hypertension associated with diabetes 08/20/2007  . GERD (gastroesophageal reflux disease) 06/13/2007   PCP:  Caren Macadam, MD Pharmacy:   [facility] [place] [address] Phone: [phone] Fax: [phone]     Social Determinants of Health (SDOH) Interventions    Readmission Risk Interventions No flowsheet data found.

## 2020-12-03 NOTE — Progress Notes (Signed)
ANTICOAGULATION CONSULT NOTE - Follow Up Consult  Pharmacy Consult for heparin Indication: atrial fibrillation  Labs: Recent Labs    12/01/20 0249 12/01/20 0958 12/01/20 2151 12/02/20 0351 12/03/20 0004 12/03/20 0500 12/03/20 1446  HGB 11.6*   < > 11.2* 10.8*  --  10.0*  --   HCT 36.9*   < > 33.0* 32.6*  --  32.3*  --   PLT 239  --   --  173  --  169  --   APTT  --    < >  --  82*  --  119* 103*  HEPARINUNFRC  --   --   --  1.44*  --  0.80* 0.77*  CREATININE 1.48*  --   --  1.47* 1.57*  --   --    < > = values in this interval not displayed.    Assessment: 86yo male with no anticoagulants reported PTA. Now being treated for new onset afib with IV heparin. Pt did receive apixaban 5mg  x1 on 12/21 @2139  and has since been transitioned back to IV heparin for anticoagulation.  As heparin levels and aPTTs now clinically correlated, will follow with heparin level only.   APTT  103 (supratherapeutic) HL 0.77 (supratherapeutic)  CBC low stable; Hgb 10.0, Plt 169  Goal of Therapy:  Heparin level 0.3-0.7 units/ml aPTT 66-102 seconds    Plan:  Decrease heparin to 1100 units/hr  8hr HL at 0000 Monitor CBC, HL, s/sx bleeding

## 2020-12-03 NOTE — Progress Notes (Signed)
ANTICOAGULATION CONSULT NOTE - Follow Up Consult  Pharmacy Consult for heparin Indication: atrial fibrillation  Labs: Recent Labs    11/20/2020 0740 12/09/2020 1052 11/17/2020 1151 12/01/20 0249 12/01/20 0958 12/01/20 1954 12/01/20 2040 12/01/20 2151 12/02/20 0351 12/03/20 0004 12/03/20 0500  HGB  --    < >  --  11.6*   < >  --    < > 11.2* 10.8*  --  10.0*  HCT  --    < >  --  36.9*   < >  --    < > 33.0* 32.6*  --  32.3*  PLT  --   --   --  239  --   --   --   --  173  --  169  APTT  --   --   --   --   --  59*  --   --  82*  --  119*  HEPARINUNFRC  --   --   --   --   --   --   --   --  1.44*  --  0.80*  CREATININE  --   --   --  1.48*  --   --   --   --  1.47* 1.57*  --   TROPONINIHS 1,014*  --  945*  --   --   --   --   --   --   --   --    < > = values in this interval not displayed.    Assessment: 86yo male supratherapeutic on heparin after one PTT at goal; no gtt issues though RN notes some oozing from an IV site that has now been removed.  Goal of Therapy:  aPTT 66-102 seconds   Plan:  Will deccrease heparin gtt by ~1 unit/kg/hr to 1200 units/hr and check PTT in 8 hours.    Wynona Neat, PharmD, BCPS  12/03/2020,6:14 AM

## 2020-12-04 ENCOUNTER — Inpatient Hospital Stay (HOSPITAL_COMMUNITY): Payer: Medicare PPO

## 2020-12-04 DIAGNOSIS — I2510 Atherosclerotic heart disease of native coronary artery without angina pectoris: Secondary | ICD-10-CM | POA: Diagnosis not present

## 2020-12-04 DIAGNOSIS — I472 Ventricular tachycardia: Secondary | ICD-10-CM | POA: Diagnosis not present

## 2020-12-04 DIAGNOSIS — I5043 Acute on chronic combined systolic (congestive) and diastolic (congestive) heart failure: Secondary | ICD-10-CM | POA: Diagnosis not present

## 2020-12-04 DIAGNOSIS — I4891 Unspecified atrial fibrillation: Secondary | ICD-10-CM | POA: Diagnosis not present

## 2020-12-04 LAB — CBC
HCT: 34.7 % — ABNORMAL LOW (ref 39.0–52.0)
Hemoglobin: 10.5 g/dL — ABNORMAL LOW (ref 13.0–17.0)
MCH: 29.9 pg (ref 26.0–34.0)
MCHC: 30.3 g/dL (ref 30.0–36.0)
MCV: 98.9 fL (ref 80.0–100.0)
Platelets: 225 10*3/uL (ref 150–400)
RBC: 3.51 MIL/uL — ABNORMAL LOW (ref 4.22–5.81)
RDW: 14.8 % (ref 11.5–15.5)
WBC: 15.2 10*3/uL — ABNORMAL HIGH (ref 4.0–10.5)
nRBC: 0 % (ref 0.0–0.2)

## 2020-12-04 LAB — GLUCOSE, CAPILLARY
Glucose-Capillary: 285 mg/dL — ABNORMAL HIGH (ref 70–99)
Glucose-Capillary: 304 mg/dL — ABNORMAL HIGH (ref 70–99)
Glucose-Capillary: 257 mg/dL — ABNORMAL HIGH (ref 70–99)
Glucose-Capillary: 165 mg/dL — ABNORMAL HIGH (ref 70–99)
Glucose-Capillary: 205 mg/dL — ABNORMAL HIGH (ref 70–99)
Glucose-Capillary: 131 mg/dL — ABNORMAL HIGH (ref 70–99)
Glucose-Capillary: 145 mg/dL — ABNORMAL HIGH (ref 70–99)
Glucose-Capillary: 157 mg/dL — ABNORMAL HIGH (ref 70–99)
Glucose-Capillary: 169 mg/dL — ABNORMAL HIGH (ref 70–99)
Glucose-Capillary: 136 mg/dL — ABNORMAL HIGH (ref 70–99)
Glucose-Capillary: 137 mg/dL — ABNORMAL HIGH (ref 70–99)
Glucose-Capillary: 137 mg/dL — ABNORMAL HIGH (ref 70–99)
Glucose-Capillary: 288 mg/dL — ABNORMAL HIGH (ref 70–99)
Glucose-Capillary: 237 mg/dL — ABNORMAL HIGH (ref 70–99)

## 2020-12-04 LAB — BASIC METABOLIC PANEL
Anion gap: 14 (ref 5–15)
Anion gap: 11 (ref 5–15)
Anion gap: 13 (ref 5–15)
Anion gap: 16 — ABNORMAL HIGH (ref 5–15)
Anion gap: 16 — ABNORMAL HIGH (ref 5–15)
BUN: 39 mg/dL — ABNORMAL HIGH (ref 8–23)
BUN: 38 mg/dL — ABNORMAL HIGH (ref 8–23)
BUN: 40 mg/dL — ABNORMAL HIGH (ref 8–23)
BUN: 44 mg/dL — ABNORMAL HIGH (ref 8–23)
BUN: 47 mg/dL — ABNORMAL HIGH (ref 8–23)
CO2: 23 mmol/L (ref 22–32)
CO2: 24 mmol/L (ref 22–32)
CO2: 23 mmol/L (ref 22–32)
CO2: 20 mmol/L — ABNORMAL LOW (ref 22–32)
CO2: 19 mmol/L — ABNORMAL LOW (ref 22–32)
Calcium: 8.3 mg/dL — ABNORMAL LOW (ref 8.9–10.3)
Calcium: 8.3 mg/dL — ABNORMAL LOW (ref 8.9–10.3)
Calcium: 8.2 mg/dL — ABNORMAL LOW (ref 8.9–10.3)
Calcium: 8.3 mg/dL — ABNORMAL LOW (ref 8.9–10.3)
Calcium: 8.1 mg/dL — ABNORMAL LOW (ref 8.9–10.3)
Chloride: 101 mmol/L (ref 98–111)
Chloride: 106 mmol/L (ref 98–111)
Chloride: 103 mmol/L (ref 98–111)
Chloride: 101 mmol/L (ref 98–111)
Chloride: 103 mmol/L (ref 98–111)
Creatinine, Ser: 1.56 mg/dL — ABNORMAL HIGH (ref 0.61–1.24)
Creatinine, Ser: 1.57 mg/dL — ABNORMAL HIGH (ref 0.61–1.24)
Creatinine, Ser: 1.62 mg/dL — ABNORMAL HIGH (ref 0.61–1.24)
Creatinine, Ser: 1.93 mg/dL — ABNORMAL HIGH (ref 0.61–1.24)
Creatinine, Ser: 2.31 mg/dL — ABNORMAL HIGH (ref 0.61–1.24)
GFR, Estimated: 43 mL/min — ABNORMAL LOW (ref >60)
GFR, Estimated: 43 mL/min — ABNORMAL LOW (ref >60)
GFR, Estimated: 41 mL/min — ABNORMAL LOW (ref >60)
GFR, Estimated: 34 mL/min — ABNORMAL LOW (ref >60)
GFR, Estimated: 27 mL/min — ABNORMAL LOW (ref >60)
Glucose, Bld: 362 mg/dL — ABNORMAL HIGH (ref 70–99)
Glucose, Bld: 228 mg/dL — ABNORMAL HIGH (ref 70–99)
Glucose, Bld: 147 mg/dL — ABNORMAL HIGH (ref 70–99)
Glucose, Bld: 370 mg/dL — ABNORMAL HIGH (ref 70–99)
Glucose, Bld: 280 mg/dL — ABNORMAL HIGH (ref 70–99)
Potassium: 3.4 mmol/L — ABNORMAL LOW (ref 3.5–5.1)
Potassium: 2.7 mmol/L — CL (ref 3.5–5.1)
Potassium: 3.4 mmol/L — ABNORMAL LOW (ref 3.5–5.1)
Potassium: 5.2 mmol/L — ABNORMAL HIGH (ref 3.5–5.1)
Potassium: 5.5 mmol/L — ABNORMAL HIGH (ref 3.5–5.1)
Sodium: 138 mmol/L (ref 135–145)
Sodium: 141 mmol/L (ref 135–145)
Sodium: 139 mmol/L (ref 135–145)
Sodium: 137 mmol/L (ref 135–145)
Sodium: 138 mmol/L (ref 135–145)

## 2020-12-04 LAB — MAGNESIUM: Magnesium: 2.2 mg/dL (ref 1.7–2.4)

## 2020-12-04 LAB — COOXEMETRY PANEL
Carboxyhemoglobin: 0.6 % (ref 0.5–1.5)
Carboxyhemoglobin: 0.5 % (ref 0.5–1.5)
Carboxyhemoglobin: 0.7 % (ref 0.5–1.5)
Methemoglobin: 1 % (ref 0.0–1.5)
Methemoglobin: 1.1 % (ref 0.0–1.5)
Methemoglobin: 1 % (ref 0.0–1.5)
O2 Saturation: 98.5 %
O2 Saturation: 97.8 %
O2 Saturation: 71.6 %
Total hemoglobin: 10.8 g/dL — ABNORMAL LOW (ref 12.0–16.0)
Total hemoglobin: 11.4 g/dL — ABNORMAL LOW (ref 12.0–16.0)
Total hemoglobin: 8.8 g/dL — ABNORMAL LOW (ref 12.0–16.0)

## 2020-12-04 LAB — MULTIPLE MYELOMA PANEL, SERUM
Albumin SerPl Elph-Mcnc: 3.4 g/dL (ref 2.9–4.4)
Albumin/Glob SerPl: 1.4 (ref 0.7–1.7)
Alpha 1: 0.3 g/dL (ref 0.0–0.4)
Alpha2 Glob SerPl Elph-Mcnc: 0.7 g/dL (ref 0.4–1.0)
B-Globulin SerPl Elph-Mcnc: 0.8 g/dL (ref 0.7–1.3)
Gamma Glob SerPl Elph-Mcnc: 0.7 g/dL (ref 0.4–1.8)
Globulin, Total: 2.5 g/dL (ref 2.2–3.9)
IgA: 224 mg/dL (ref 61–437)
IgG (Immunoglobin G), Serum: 787 mg/dL (ref 603–1613)
IgM (Immunoglobulin M), Srm: 53 mg/dL (ref 15–143)
Total Protein ELP: 5.9 g/dL — ABNORMAL LOW (ref 6.0–8.5)

## 2020-12-04 LAB — TRIGLYCERIDES: Triglycerides: 78 mg/dL (ref <150)

## 2020-12-04 LAB — HEPARIN LEVEL (UNFRACTIONATED)
Heparin Unfractionated: 0.45 IU/mL (ref 0.30–0.70)
Heparin Unfractionated: 0.59 IU/mL (ref 0.30–0.70)

## 2020-12-04 LAB — APTT: aPTT: 96 seconds — ABNORMAL HIGH (ref 24–36)

## 2020-12-04 LAB — PROCALCITONIN: Procalcitonin: 2.48 ng/mL

## 2020-12-04 MED ORDER — VASOPRESSIN 20 UNITS/100 ML INFUSION FOR SHOCK
0.0000 [IU]/min | INTRAVENOUS | Status: DC
  Administered 2020-12-04 – 2020-12-05 (×3): 0.03 [IU]/min via INTRAVENOUS
  Filled 2020-12-04 (×3): qty 100

## 2020-12-04 MED ORDER — POTASSIUM CHLORIDE 10 MEQ/50ML IV SOLN
10.0000 meq | INTRAVENOUS | Status: AC
  Administered 2020-12-04 (×2): 10 meq via INTRAVENOUS
  Filled 2020-12-04: qty 50

## 2020-12-04 MED ORDER — INSULIN ASPART 100 UNIT/ML ~~LOC~~ SOLN
3.0000 [IU] | SUBCUTANEOUS | Status: DC
  Administered 2020-12-04 – 2020-12-05 (×3): 9 [IU] via SUBCUTANEOUS
  Administered 2020-12-05 (×2): 6 [IU] via SUBCUTANEOUS

## 2020-12-04 MED ORDER — AMIODARONE LOAD VIA INFUSION
150.0000 mg | Freq: Once | INTRAVENOUS | Status: AC
  Administered 2020-12-04: 14:00:00 150 mg via INTRAVENOUS
  Filled 2020-12-04: qty 83.34

## 2020-12-04 MED ORDER — INSULIN DETEMIR 100 UNIT/ML ~~LOC~~ SOLN
10.0000 [IU] | Freq: Once | SUBCUTANEOUS | Status: AC
  Administered 2020-12-04: 18:00:00 10 [IU] via SUBCUTANEOUS
  Filled 2020-12-04: qty 0.1

## 2020-12-04 MED ORDER — POTASSIUM CHLORIDE 10 MEQ/50ML IV SOLN
10.0000 meq | INTRAVENOUS | Status: DC
  Administered 2020-12-04 (×2): 10 meq via INTRAVENOUS
  Filled 2020-12-04 (×3): qty 50

## 2020-12-04 MED ORDER — INSULIN REGULAR(HUMAN) IN NACL 100-0.9 UT/100ML-% IV SOLN
INTRAVENOUS | Status: DC
  Administered 2020-12-04: 02:00:00 16 [IU]/h via INTRAVENOUS
  Filled 2020-12-04 (×2): qty 100

## 2020-12-04 MED ORDER — FUROSEMIDE 10 MG/ML IJ SOLN
80.0000 mg | Freq: Two times a day (BID) | INTRAMUSCULAR | Status: DC
  Administered 2020-12-04: 18:00:00 80 mg via INTRAVENOUS
  Filled 2020-12-04: qty 8

## 2020-12-04 MED ORDER — DEXTROSE 50 % IV SOLN: 0.0000 mL | INTRAVENOUS | Status: DC | PRN

## 2020-12-04 MED ORDER — INSULIN DETEMIR 100 UNIT/ML ~~LOC~~ SOLN
15.0000 [IU] | Freq: Two times a day (BID) | SUBCUTANEOUS | Status: DC
  Administered 2020-12-04: 22:00:00 15 [IU] via SUBCUTANEOUS
  Filled 2020-12-04 (×3): qty 0.15

## 2020-12-04 MED ORDER — NOREPINEPHRINE 16 MG/250ML-% IV SOLN
0.0000 ug/min | INTRAVENOUS | Status: DC
  Administered 2020-12-04: 17:00:00 38 ug/min via INTRAVENOUS
  Administered 2020-12-05: 06:00:00 50 ug/min via INTRAVENOUS
  Administered 2020-12-05: 40 ug/min via INTRAVENOUS
  Filled 2020-12-04 (×3): qty 250

## 2020-12-04 NOTE — Progress Notes (Addendum)
eLink Physician-Brief Progress Note Patient Name: [name] DOB: [birth year] MRN: [id number]   Date of Service  12/04/2020  HPI/Events of Note  Glucose 300+  eICU Interventions  Phase 2 glycemic control ordered. IV insulin. Last K was 3.7 so ordered stat BMP to make sure K is ok. Please let us know if potassium is less than 3.5      Intervention Category Major Interventions: Hyperglycemia - active titration of insulin therapy  Jnai Snellgrove G Shawnte Winton 12/04/2020, 1:32 AM   3 am - also ordered q4 BMP x 6, please replace further orders if continues on insulin

## 2020-12-04 NOTE — Progress Notes (Signed)
eLink Physician-Brief Progress Note Patient Name: [name] DOB: [birth year] MRN: [id number]   Date of Service  12/04/2020  HPI/Events of Note  Hypotension, SBP in 90s and arterial line positional. MAP is 63. ON max dose levophed and on vasopressin.   eICU Interventions  Given severely low EF, ok to tolerate MAP > 60 and this systolic bp is acceptable Overall poor prognosis     Intervention Category Major Interventions: Shock - evaluation and management  Alailah Safley G Genita Nilsson 12/04/2020, 9:10 PM

## 2020-12-04 NOTE — Progress Notes (Signed)
ANTICOAGULATION CONSULT NOTE - Follow Up Consult  Pharmacy Consult for heparin Indication: atrial fibrillation  Labs: Recent Labs    12/01/20 0249 12/01/20 0958 12/01/20 2151 12/01/20 2151 12/02/20 0351 12/03/20 0004 12/03/20 0500 12/03/20 1446 12/03/20 2350  HGB 11.6*   < > 11.2*  --  10.8*  --  10.0*  --   --   HCT 36.9*   < > 33.0*  --  32.6*  --  32.3*  --   --   PLT 239  --   --   --  173  --  169  --   --   APTT  --    < >  --   --  82*  --  119* 103*  --   HEPARINUNFRC  --   --   --    < > 1.44*  --  0.80* 0.77* 0.59  CREATININE 1.48*  --   --   --  1.47* 1.57*  --   --   --    < > = values in this interval not displayed.    Assessment: 86yo male with no anticoagulants reported PTA. Now being treated for new onset afib with IV heparin. Pt did receive apixaban 5mg  x1 on 12/21 @2139  and has since been transitioned back to IV heparin for anticoagulation.  As heparin levels and aPTTs now clinically correlated, will follow with heparin level only.   Heparin level therapeutic (0.59) on gtt at 1100 units/hr. No bleeding noted.  Goal of Therapy:  Heparin level 0.3-0.7 units/ml   Plan:  Continue heparin at 1100 units/hr  F/u daily heparin level and CBC  Sherlon Handing, PharmD, BCPS Please see amion for complete clinical pharmacist phone list 12/04/2020 12:40 AM

## 2020-12-04 NOTE — Progress Notes (Signed)
Advanced Heart Failure Rounding Note  PCP-Cardiologist: Kirk Ruths, MD   Subjective:     86 y.o. male with a history of moderate nonobstructive CAD, DM2 and PVCs.  Events 12/20  Admitted acute HF and new AF  12/21  Cath non-obstructive CAD. LVEDP 30 Echo EF 20% (previously normal) 12/22  PEA arrest (CPR x 3 min) due to flash pulmonary edema -> intubated 12/24  TEE EF 20%. Failed DC-CV (was on amio) 12/24 started on a bx for PNA  Remains intubated/sedated. Failed DC-CV yesterday. Remains in AF with rates 120-130 on IV amio at 60. On NE 12. Urine output picking up on lasix 60 IV bid . Weight up 13 pounds.   Co-ox yesterday 72%  Creatinine stabilizing at 1.6. PCT 2.48 on vanc/cefepime. SBP low 80-90s. CVP 15  Objective:   Weight Range: 88.7 kg Body mass index is 28.06 kg/m.   Vital Signs:   Temp:  [98.5 F (36.9 C)-101.2 F (38.4 C)] 100 F (37.8 C) (12/25 1150) Pulse Rate:  [102-149] 135 (12/25 0813) Resp:  [12-32] 16 (12/25 0813) BP: (102-137)/(63-90) 102/63 (12/25 0813) SpO2:  [88 %-100 %] 96 % (12/25 0813) Arterial Line BP: (90-151)/(51-82) 102/58 (12/25 0600) FiO2 (%):  [40 %-55 %] 50 % (12/25 0813) Weight:  [88.7 kg] 88.7 kg (12/25 0307) Last BM Date: 12/01/20  Weight change: Filed Weights   12/02/20 0500 12/03/20 0430 12/04/20 0307  Weight: 86.1 kg 88.3 kg 88.7 kg    Intake/Output:   Intake/Output Summary (Last 24 hours) at 12/04/2020 1205 Last data filed at 12/04/2020 0600 Gross per 24 hour  Intake 3758.22 ml  Output 2385 ml  Net 1373.22 ml      Physical Exam    CVP 15 General:  Intubated/sedated HEENT: normal +ETT Neck: supple. JVP to ear. Carotids 2+ bilat; no bruits. No lymphadenopathy or thryomegaly appreciated. Cor: PMI nondisplaced. Irreg tachy 2/6 AS Lungs: + crackles Abdomen: soft, nontender, nondistended. No hepatosplenomegaly. No bruits or masses. Good bowel sounds. Extremities: no cyanosis, clubbing, rash, 2+ edema Neuro:  intubated/sedated   Telemetry   Afib 120-130. Personally reviewed  Labs    CBC Recent Labs    12/03/20 0500 12/04/20 0148  WBC 7.5 15.2*  HGB 10.0* 10.5*  HCT 32.3* 34.7*  MCV 97.3 98.9  PLT 169 196   Basic Metabolic Panel Recent Labs    12/03/20 0500 12/03/20 1635 12/04/20 0148 12/04/20 0355  NA  --   --  138 141  K  --   --  3.4* 2.7*  CL  --   --  101 106  CO2  --   --  23 24  GLUCOSE  --   --  362* 228*  BUN  --   --  39* 38*  CREATININE  --   --  1.56* 1.57*  CALCIUM  --   --  8.3* 8.3*  MG 2.0 1.8  --   --   PHOS 4.2 3.5  --   --    Liver Function Tests No results for input(s): AST, ALT, ALKPHOS, BILITOT, PROT, ALBUMIN in the last 72 hours. No results for input(s): LIPASE, AMYLASE in the last 72 hours. Cardiac Enzymes No results for input(s): CKTOTAL, CKMB, CKMBINDEX, TROPONINI in the last 72 hours.  BNP: BNP (last 3 results) Recent Labs    11/12/2020 1626  BNP 975.6*    ProBNP (last 3 results) No results for input(s): PROBNP in the last 8760 hours.   D-Dimer No results  for input(s): DDIMER in the last 72 hours. Hemoglobin A1C No results for input(s): HGBA1C in the last 72 hours. Fasting Lipid Panel Recent Labs    12/04/20 0355  TRIG 78   Thyroid Function Tests No results for input(s): TSH, T4TOTAL, T3FREE, THYROIDAB in the last 72 hours.  Invalid input(s): FREET3  Other results:   Imaging    DG Abd 1 View  Result Date: 12/04/2020 CLINICAL DATA:  OG tube placement. EXAM: ABDOMEN - 1 VIEW COMPARISON:  12/03/2020 FINDINGS: Again noted is an OG/NG tube that extends into the abdomen. The tube has been slightly advanced compared to the previous examination and the tip is likely in the gastric body region. Again noted are densities at the lung bases. IMPRESSION: OG tube in the stomach body region. Bibasilar chest densities. Electronically Signed   By: Markus Daft M.D.   On: 12/04/2020 10:28   DG Abd 1 View  Result Date:  12/03/2020 CLINICAL DATA:  Check gastric catheter placement EXAM: ABDOMEN - 1 VIEW COMPARISON:  12/01/2020 FINDINGS: Gastric catheter is noted with tip in the stomach. Proximal side port is noted within the stomach as well. Scattered large and small bowel gas is noted. Right-sided pleural effusion is seen. IMPRESSION: Gastric catheter in the stomach. Electronically Signed   By: Inez Catalina M.D.   On: 12/03/2020 16:26     Medications:     Scheduled Medications: . aspirin  81 mg Per Tube QHS  . chlorhexidine gluconate (MEDLINE KIT)  15 mL Mouth Rinse BID  . Chlorhexidine Gluconate Cloth  6 each Topical Daily  . docusate  100 mg Per Tube BID  . donepezil  5 mg Per Tube q AM  . fentaNYL (SUBLIMAZE) injection  25 mcg Intravenous Once  . ferrous sulfate  220 mg Per Tube QODAY  . furosemide  60 mg Intravenous BID  . heparin lock flush  500 Units Intravenous Once  . insulin aspart  2-6 Units Subcutaneous Q4H  . insulin detemir  15 Units Subcutaneous BID  . mouth rinse  15 mL Mouth Rinse 10 times per day  . mupirocin ointment  1 application Nasal BID  . pantoprazole sodium  40 mg Per Tube Daily  . polyethylene glycol  17 g Per Tube Daily  . pravastatin  40 mg Per Tube QPM  . senna-docusate  1 tablet Per Tube BID  . sodium chloride flush  10-40 mL Intracatheter Q12H  . sodium chloride flush  3 mL Intravenous Q12H  . sodium chloride flush  3 mL Intravenous Q12H    Infusions: . sodium chloride    . sodium chloride 10 mL/hr at 12/04/20 0600  . sodium chloride    . amiodarone 60 mg/hr (12/04/20 1106)  . ceFEPime (MAXIPIME) IV 2 g (12/04/20 0956)  . feeding supplement (VITAL AF 1.2 CAL) 1,000 mL (12/04/20 1110)  . fentaNYL infusion INTRAVENOUS 100 mcg/hr (12/04/20 0600)  . heparin 1,100 Units/hr (12/04/20 0600)  . norepinephrine (LEVOPHED) Adult infusion 12 mcg/min (12/04/20 1051)  . propofol (DIPRIVAN) infusion 30 mcg/kg/min (12/04/20 1056)  . vancomycin 1,250 mg (12/04/20 1155)  .  vasopressin      PRN Medications: sodium chloride, acetaminophen, fentaNYL, midazolam, ondansetron (ZOFRAN) IV, sodium chloride flush, sodium chloride flush    Assessment/Plan   1. Acute Biventricular Heart Failure -> cardiogenic shock - Echo 10/2020: EF 55-60%, GIIDD, RV normal  - Echo 11/2020: EF now 20% RV moderately reduced. - Cath shows mild nonobstructive CAD  - Based on timeline, suspect  tachymediated CM secondary to rapid atrial fibrillation and frequent PVCs - Dobutamine started 12/22 for low output -> switched to NE - Remains volume overloaded. Diuresis improved on lasix 60IV bid. Still up ~ 15 pounds.  - Co-ox 72% CVP 15 Increase lasix to 80IV bid  - Suspect hypotension may be related to vasodilation as well in setting of infection. Add VP.   2. Atrial Fibrillation, persistent - newly diagnosed, rates in 140s on admit - suspect cause of CM.  - rates elevated. Increase amio to 60. Will bolus again now - failed DC-CV on 12/24. Will reattempt in several days as amio load progresses and he is less ill - continue heparin gtt   3. AKI  - baseline SCr 0.9 - 1.10 on admit, stable at 1.57 today  - seems to be palteauing - suspect cardiorenal  - adjust inotropes as above  4. Acute hypoxic respiratory failure - due to HF and  PNA - Vanc/cefipime started 12/24. PCT 2.46 Check sputum ordered but not yet collected - Continue diuresis - lung u/s 12/24 shows moderate bilateral pleural effusion. May need tap if not improved with diuresis - CXR today  5. Moderate- Severe MR  - mild MR noted on echo 11/21 (EF normal) - now mod-severe on Limited echo 12/21. EF now 20%, c/w functional MR secondary to severe LV dysfunction  6. Aortic Stenosis  - mild- moderate by echo 11/21, mean gradient 17 mmHg. LVOT/AV VTI ratio: 0.31  7. PEA Arrest 12/01/20 - s/p resuscitation w/ 3 min CPR   8. Elevated Hs Troponin  - Hs peaked at 1,041>>945 - cath w/ no significant CAD - demand  ischemia from rapid afib and acute CHF   9. Hypokalemia - supp - cann add spiro when BPs better - Discussed dosing with PharmD personally.    CRITICAL CARE Performed by: Glori Bickers  Total critical care time: 40 minutes  Critical care time was exclusive of separately billable procedures and treating other patients.  Critical care was necessary to treat or prevent imminent or life-threatening deterioration.  Critical care was time spent personally by me (independent of midlevel providers or residents) on the following activities: development of treatment plan with patient and/or surrogate as well as nursing, discussions with consultants, evaluation of patient's response to treatment, examination of patient, obtaining history from patient or surrogate, ordering and performing treatments and interventions, ordering and review of laboratory studies, ordering and review of radiographic studies, pulse oximetry and re-evaluation of patient's condition.   Length of Stay: [employer], MD  12/04/2020, 12:05 PM  Advanced Heart Failure Team Pager [phone] (M-F; 7a - 4p)  Please contact [facility] for night-coverage after hours (4p -7a ) and weekends on [email or web address]

## 2020-12-04 NOTE — Progress Notes (Signed)
Potassium 3.4. E-link RN notified and will make Dr. Jeannene Patella aware.

## 2020-12-04 NOTE — Progress Notes (Signed)
NAME:  [name], MRN:  [id number], DOB:  1933-03-22, LOS: 5 ADMISSION DATE:  12/10/2020, CONSULTATION DATE: 12/01/2020 REFERRING MD: Dr. Doristine Bosworth, CHIEF COMPLAINT: Status post PEA cardiac arrest  Brief History:  86 year old diastolic congestive heart failure presented with worsening shortness of breath and substernal chest pain, found to be in A. fib RVR with elevated troponin.  Cardiac cath was done which showed moderately obstructive disease but no intervention was done. On 12/22 patient became short of breath, noted to be hypoxic and went into PEA arrest x2 total CPR time was 5 minutes.  Intubated and was transferred to ICU  Past Medical History:  Chronic diastolic heart failure Coronary artery disease Diabetes type 2 Hypertension Chronic neuropathy Significant Hospital Events:  12/22 PEA cardiac arrest, admitted to ICU  Consults:  PCCM Cardiology  Procedures:  12/22 ETT 12/22 right IJ TLC 12/24 art line Significant Diagnostic Tests:  12/22 CT head: No acute or reversible finding. 12/22: CXR: RIGHT pleural effusion and bibasilar atelectasis.  Micro Data:  12/22 MRSA PCR positive 12/20 Covid/influenza PCR negative 12/24 respiratory culture >>  Antimicrobials:  12/24 vancomycin > 12/24 cefepime>  Interim History / Subjective:  Patient remained in A. fib with RVR, heart rate ranging in 140s, he had unsuccessful TEE/cardioversion x3 yesterday Continue to remain on mechanical ventilator  Objective   Blood pressure (!) 99/57, pulse (!) 132, temperature 100 F (37.8 C), temperature source Axillary, resp. rate 19, height 5\' 10"  (1.778 m), weight 88.7 kg, SpO2 98 %. CVP:  [11 mmHg-17 mmHg] 17 mmHg  Vent Mode: PRVC FiO2 (%):  [40 %-55 %] 40 % Set Rate:  [15 bmp] 15 bmp Vt Set:  [500 mL] 500 mL PEEP:  [5 cmH20] 5 cmH20 Plateau Pressure:  [15 cmH20-16 cmH20] 16 cmH20   Intake/Output Summary (Last 24 hours) at 12/04/2020 1501 Last data filed at 12/04/2020  1400 Gross per 24 hour  Intake 2883.32 ml  Output 2060 ml  Net 823.32 ml   Filed Weights   12/02/20 0500 12/03/20 0430 12/04/20 0307  Weight: 86.1 kg 88.3 kg 88.7 kg    Examination: Constitutional: Elderly Caucasian male, lying on the bed, orally intubated Eyes: pupils pinpoint, equal reactive to light Ears, nose, mouth, and throat: ETT with thick green secretions Cardiovascular: Irregular, tachycardic, ext warm Respiratory: Diminished at bases, bilateral moderate effusions with [place] Gastrointestinal: Soft, hypoactive BS Skin: No rashes, normal turgor Neurologic: moves all 4 ext with sedation lightened Psychiatric: RASS -2   Co ox 71%  Resolved Hospital Problem list     Assessment & Plan:  Acute hypoxemic respiratory failure due to pulmonary edema with likely HCAP pneumonia Status post PEA cardiac arrest due to hypoxemia and flash pulmonary edema Continue lung protective ventilation On aggressive diuresis His white count was slightly elevated with temperature of 100.3 yesterday with thick greenish secretions, sent for respiratory culture Procalcitonin is elevated Started on IV vancomycin and cefepime Follow-up respiratory culture   NSTEMI versus demand cardiac ischemia Acute on chronic systolic/diastolic congestive heart failure Paroxysmal A. fib with RVR Patient troponins were elevated, likely due to demand cardiac ischemia but NSTEMI was concern Continue aggressive diuresis, patient serum creatinine is holding up Continue IV heparin infusion Heart rate will still not well controlled, appreciate heart failure team follow-up Yesterday patient had TEE cardioversion x3 which he failed Continue amiodarone  Shock, cardiac and component of septic septic with rising temps, increasing secretions AKI related to deompensated heart failure Dobutamine was stopped yesterday due to tachyarrhythmia Requiring  high-dose Levophed currently on 31 mics, vasopressin added Monitor SvO2,  Uop post cardioversion, avoid ARBs for now  Metabolic encephalopathy related to sedation, less likely hypoxemic brain injury but will need to get him completely off to fully evaluate. We will wean sedation once his heart rate gets better controlled  DM2 Monitor fingerstick Continue sliding scale insulin with goal 140-180  Baseline Lewy Body dementia- if does not turn around soon, GOC discussion warranted  Best practice (evaluated daily)  Diet: NPO, tube feeds Pain/Anxiety/Delirium protocol (if indicated): wean after cardioversion to precedex+fentanyl VAP protocol (if indicated): Yes DVT prophylaxis: IV heparin GI prophylaxis: Protonix Glucose control: Basal and bolus insulin Mobility: Bedrest Disposition: ICU Wife updated at bedside 12/25  Goals of Care:  Last date of multidisciplinary goals of care discussion: 12/22 Family and staff present: Patient's wife, MD and RN Summary of discussion: Patient's family decided to make him DNR Follow up goals of care discussion due: 12/29 Code Status: DNR   Total critical care time: 40 minutes  Performed by: Jacky Kindle   Critical care time was exclusive of separately billable procedures and treating other patients.   Critical care was necessary to treat or prevent imminent or life-threatening deterioration.   Critical care was time spent personally by me on the following activities: development of treatment plan with patient and/or surrogate as well as nursing, discussions with consultants, evaluation of patient's response to treatment, examination of patient, obtaining history from patient or surrogate, ordering and performing treatments and interventions, ordering and review of laboratory studies, ordering and review of radiographic studies, pulse oximetry and re-evaluation of patient's condition.   Jacky Kindle MD [facility] Pager: [phone] Mobile: [phone]

## 2020-12-04 NOTE — Progress Notes (Signed)
ANTICOAGULATION CONSULT NOTE - Follow Up Consult  Pharmacy Consult for heparin Indication: atrial fibrillation  Labs: Recent Labs    12/02/20 0351 12/03/20 0004 12/03/20 0500 12/03/20 1446 12/03/20 2350 12/04/20 0148 12/04/20 0355  HGB 10.8*  --  10.0*  --   --  10.5*  --   HCT 32.6*  --  32.3*  --   --  34.7*  --   PLT 173  --  169  --   --  225  --   APTT 82*  --  119* 103*  --  96*  --   HEPARINUNFRC 1.44*  --  0.80* 0.77* 0.59  --  0.45  CREATININE 1.47* 1.57*  --   --   --  1.56*  --     Assessment: 86yo male with no anticoagulants reported PTA. Now being treated for new onset afib with IV heparin. Pt did receive apixaban 5mg  x1 on 12/21 @2139  and has since been transitioned back to IV heparin for anticoagulation.  As heparin levels and aPTTs now clinically correlated, will follow with heparin level only.   Heparin level therapeutic at 0.45 on heparin 1100 units/hr. Hgb 10.5. No bleeding noted.  Goal of Therapy:  Heparin level 0.3-0.7 units/ml   Plan:  Continue heparin at 1100 units/hr  F/u daily heparin level and CBC  Cristela Felt, PharmD Clinical Pharmacist  12/04/2020 6:47 AM

## 2020-12-05 LAB — BASIC METABOLIC PANEL
Anion gap: 19 — ABNORMAL HIGH (ref 5–15)
BUN: 50 mg/dL — ABNORMAL HIGH (ref 8–23)
CO2: 16 mmol/L — ABNORMAL LOW (ref 22–32)
Calcium: 7.9 mg/dL — ABNORMAL LOW (ref 8.9–10.3)
Chloride: 103 mmol/L (ref 98–111)
Creatinine, Ser: 2.62 mg/dL — ABNORMAL HIGH (ref 0.61–1.24)
GFR, Estimated: 23 mL/min — ABNORMAL LOW (ref >60)
Glucose, Bld: 212 mg/dL — ABNORMAL HIGH (ref 70–99)
Potassium: 5.3 mmol/L — ABNORMAL HIGH (ref 3.5–5.1)
Sodium: 138 mmol/L (ref 135–145)

## 2020-12-05 LAB — CBC
HCT: 37.4 % — ABNORMAL LOW (ref 39.0–52.0)
Hemoglobin: 10.9 g/dL — ABNORMAL LOW (ref 13.0–17.0)
MCH: 29.7 pg (ref 26.0–34.0)
MCHC: 29.1 g/dL — ABNORMAL LOW (ref 30.0–36.0)
MCV: 101.9 fL — ABNORMAL HIGH (ref 80.0–100.0)
Platelets: 191 10*3/uL (ref 150–400)
RBC: 3.67 MIL/uL — ABNORMAL LOW (ref 4.22–5.81)
RDW: 15 % (ref 11.5–15.5)
WBC: 17.2 10*3/uL — ABNORMAL HIGH (ref 4.0–10.5)
nRBC: 1.8 % — ABNORMAL HIGH (ref 0.0–0.2)

## 2020-12-05 LAB — COOXEMETRY PANEL
Carboxyhemoglobin: 0.5 % (ref 0.5–1.5)
Methemoglobin: 1.1 % (ref 0.0–1.5)
O2 Saturation: 58.3 %
Total hemoglobin: 11.1 g/dL — ABNORMAL LOW (ref 12.0–16.0)

## 2020-12-05 LAB — PROCALCITONIN: Procalcitonin: 4.12 ng/mL

## 2020-12-05 LAB — HEPARIN LEVEL (UNFRACTIONATED): Heparin Unfractionated: 0.28 IU/mL — ABNORMAL LOW (ref 0.30–0.70)

## 2020-12-05 LAB — GLUCOSE, CAPILLARY
Glucose-Capillary: 174 mg/dL — ABNORMAL HIGH (ref 70–99)
Glucose-Capillary: 184 mg/dL — ABNORMAL HIGH (ref 70–99)
Glucose-Capillary: 238 mg/dL — ABNORMAL HIGH (ref 70–99)

## 2020-12-05 LAB — TRIGLYCERIDES: Triglycerides: 93 mg/dL (ref <150)

## 2020-12-05 LAB — MAGNESIUM: Magnesium: 2.4 mg/dL (ref 1.7–2.4)

## 2020-12-05 MED ORDER — VANCOMYCIN HCL IN DEXTROSE 1-5 GM/200ML-% IV SOLN
1000.0000 mg | INTRAVENOUS | Status: DC
  Filled 2020-12-05: qty 200

## 2020-12-05 MED ORDER — SODIUM CHLORIDE 0.9 % IV SOLN: 2.0000 g | INTRAVENOUS | Status: DC

## 2020-12-06 LAB — IMMUNOFIXATION, URINE

## 2020-12-07 MED FILL — Medication: Qty: 1 | Status: AC

## 2020-12-11 NOTE — Progress Notes (Signed)
Mr. Tetzloff went into asystole and showed no heart rate or rhythm. Burna Mortimer, RN and Stan Head, RN auscultated for 2 minutes and heard no heart or lung sounds. Time of death pronounced by Two RN Verification at 10:37.    Family at bedside.

## 2020-12-11 NOTE — Death Summary Note (Signed)
DEATH SUMMARY   Patient Details  Name: [name] MRN: [id number] DOB: 1933/08/05  Admission/Discharge Information   Admit Date:  12/04/2020  Date of Death: Date of Death: 12/11/2020  Time of Death: Time of Death: 22  Length of Stay: 03/23/23  Referring Physician: Caren Macadam, MD   Reason(s) for Hospitalization  NSTEMI versus demand cardiac ischemia Acute on chronic systolic/diastolic congestive heart failure Paroxysmal A. fib with RVR Acute hypoxic respiratory failure due to flash pulmonary edema Bilateral multifocal pneumonia Acute kidney injury Acute metabolic encephalopathy Diabetes type 2 Diagnoses  Preliminary cause of death: Combined cardiogenic and septic shock s/p PEA cardiac arrest Secondary Diagnoses (including complications and co-morbidities):  Principal Problem:   Atrial fibrillation with rapid ventricular response ([name]) Active Problems:   CAD (coronary artery disease)   Hyperlipidemia associated with type 2 diabetes mellitus   CHF exacerbation (HCC)   NSVT (nonsustained ventricular tachycardia) (HCC)   Elevated troponin   Malnutrition of moderate degree   Brief Hospital Course (including significant findings, care, treatment, and services provided and events leading to death)  [name] is a 87 y.o. year old male who 87 year old diastolic congestive heart failure presented with worsening shortness of breath and substernal chest pain, found to be in A. fib RVR with elevated troponin.  Cardiac cath was done which showed moderately obstructive disease but no intervention was done. On 12/22 patient became short of breath, noted to be hypoxic and went into PEA arrest x2 total CPR time was 5 minutes.  Intubated and was transferred to ICU. Patient was started on IV inotropes initially for cardiogenic shock, then he spiked fever with rising white count but there was a component of septic shock.  He was started on broad-spectrum IV antibiotics.  He remained  in A. fib with RVR, cardiology/heart failure team was consulted, patient underwent TEE/electrical cardioversion x3, without reverting back to sinus rhythm.  Patient continued to require higher doses of vasopressors because of combined cardiogenic and septic shock, palliative care was consulted.  Patient's wife decided to keep him DNR/DNI and later on decided to continue with comfort care.  Patient passed on 12/11/20 at 10:37 AM.  Family was at bedside  Pertinent Labs and Studies  Significant Diagnostic Studies DG Chest 1 View  Result Date: 12/01/2020 CLINICAL DATA:  cp EXAM: CHEST  1 VIEW COMPARISON:  08/26/2015 and prior. FINDINGS: No pneumothorax. Small bilateral pleural effusions and patchy basilar opacities. Cardiomegaly and central pulmonary vascular congestion. Multilevel spondylosis. IMPRESSION: Cardiomegaly and central pulmonary vascular congestion. Bibasilar opacities and small pleural effusions, edema versus infection. Electronically Signed   By: Primitivo Gauze M.D.   On: 05-Dec-2020 16:46   DG Abd 1 View  Result Date: 12/04/2020 CLINICAL DATA:  OG tube placement. EXAM: ABDOMEN - 1 VIEW COMPARISON:  12/03/2020 FINDINGS: Again noted is an OG/NG tube that extends into the abdomen. The tube has been slightly advanced compared to the previous examination and the tip is likely in the gastric body region. Again noted are densities at the lung bases. IMPRESSION: OG tube in the stomach body region. Bibasilar chest densities. Electronically Signed   By: Markus Daft M.D.   On: 12/04/2020 10:28   DG Abd 1 View  Result Date: 12/03/2020 CLINICAL DATA:  Check gastric catheter placement EXAM: ABDOMEN - 1 VIEW COMPARISON:  12/01/2020 FINDINGS: Gastric catheter is noted with tip in the stomach. Proximal side port is noted within the stomach as well. Scattered large and small bowel gas is noted.  Right-sided pleural effusion is seen. IMPRESSION: Gastric catheter in the stomach. Electronically Signed    By: Inez Catalina M.D.   On: 12/03/2020 16:26   DG Abd 1 View  Result Date: 12/01/2020 CLINICAL DATA:  Nasogastric tube and endotracheal tube placement EXAM: ABDOMEN - 1 VIEW COMPARISON:  Portable exam 1109 hours LEFT priors for comparison FINDINGS: Tip of endotracheal tube projects 2.7 cm above carina. Nasogastric tube projects over stomach. External pacing leads project over lower LEFT chest. Bibasilar atelectasis and small RIGHT pleural effusion. Gas in stomach and:. IMPRESSION: Nasogastric tube tip projects over stomach. Electronically Signed   By: Lavonia Dana M.D.   On: 12/01/2020 11:23   CT HEAD WO CONTRAST  Result Date: 12/01/2020 CLINICAL DATA:  Mental status change with unknown cause EXAM: CT HEAD WITHOUT CONTRAST TECHNIQUE: Contiguous axial images were obtained from the base of the skull through the vertex without intravenous contrast. COMPARISON:  Brain MRI 03/31/2020 FINDINGS: Brain: No evidence of acute infarction, hemorrhage, hydrocephalus, extra-axial collection or mass lesion/mass effect. Mild cerebral volume loss and chronic small vessel ischemic low-density in the white matter Vascular: No hyperdense vessel or unexpected calcification. Skull: Negative for fracture or focal lesion. Retro dental ligamentous thickening and calcification which contacts the ventral cervicomedullary junction. Sinuses/Orbits: No acute finding. IMPRESSION: No acute or reversible finding. Electronically Signed   By: Monte Fantasia M.D.   On: 12/01/2020 06:35   CARDIAC CATHETERIZATION  Result Date: 12/04/2020  Ost LAD to Prox LAD lesion is 30% stenosed.  There is moderate left ventricular systolic dysfunction.  LV end diastolic pressure is moderately elevated.  The left ventricular ejection fraction is 25-35% by visual estimate.  There is no aortic valve stenosis.  Nonobstructive CAD.  Systolic heart failure with elevated LVEDP.  Additional Lasix given. ? Tachycardia induced cardiomyopathy?   DG CHEST  PORT 1 VIEW  Result Date: 12/04/2020 CLINICAL DATA:  Pneumonia. EXAM: PORTABLE CHEST 1 VIEW COMPARISON:  12/01/2020 FINDINGS: Enteric tube has tip and side-port over the stomach in the left upper quadrant. Endotracheal tube has tip 3.4 cm above the carina. Right IJ central venous catheter unchanged with tip over the SVC. Lungs are adequately inflated demonstrate slight worsening bilateral perihilar and bibasilar opacification likely due to moderate interstitial edema with right-sided effusion and bibasilar atelectasis. Infection is possible. Mild stable cardiomegaly. Remainder the exam is unchanged. IMPRESSION: 1. Slight worsening bilateral perihilar and bibasilar opacification likely interstitial edema with right-sided effusion and bibasilar atelectasis. Infection is possible. 2. Tubes and lines as described. Electronically Signed   By: Marin Olp M.D.   On: 12/04/2020 12:44   DG CHEST PORT 1 VIEW  Result Date: 12/01/2020 CLINICAL DATA:  Acute respiratory failure with hypercapnia. EXAM: PORTABLE CHEST 1 VIEW COMPARISON:  12/01/2020 at 11:06 a.m. FINDINGS: An endotracheal tube terminates approximately 3 cm above the carina. Enteric tube courses into the upper abdomen with tip not imaged. A new right jugular catheter terminates over the mid SVC. The cardiac silhouette is upper limits of normal in size. Interstitial and hazy airspace opacities bilaterally are unchanged and greatest in the lung bases. A right pleural effusion is also unchanged. No pneumothorax is identified. IMPRESSION: 1. New right jugular catheter as above. 2. Unchanged bilateral lung opacities which could reflect edema or infection with a right pleural effusion. Electronically Signed   By: Logan Bores M.D.   On: 12/01/2020 14:49   DG CHEST PORT 1 VIEW  Result Date: 12/01/2020 CLINICAL DATA:  Type II diabetes mellitus, hypertension,  nasogastric tube placement EXAM: PORTABLE CHEST 1 VIEW COMPARISON:  Portable exam 1106 hours compared  to 0756 hours FINDINGS: Tip of endotracheal tube projects 3.0 cm above carina. Nasogastric tube extends into stomach. External pacing leads project over chest. Upper normal heart size with slight vascular congestion. Atherosclerotic calcification aorta. RIGHT pleural effusion and bibasilar atelectasis. No pneumothorax. Endplate spur formation thoracic spine. IMPRESSION: RIGHT pleural effusion and bibasilar atelectasis. Nasogastric tube extends into stomach. Electronically Signed   By: Lavonia Dana M.D.   On: 12/01/2020 11:22   DG CHEST PORT 1 VIEW  Result Date: 12/01/2020 CLINICAL DATA:  Hypoxia.  New onset confusion EXAM: PORTABLE CHEST 1 VIEW COMPARISON:  Two days ago FINDINGS: Increased hazy opacity of the right chest, usually layering pleural fluid. Underlying pulmonary opacity which is both interstitial and airspace and seen on both sides. Cardiomegaly. No visible pneumothorax. IMPRESSION: 1. Increased right chest opacification, likely layering pleural fluid. 2. Diffuse pulmonary opacity that could be edema or infection. Electronically Signed   By: Monte Fantasia M.D.   On: 12/01/2020 08:32   ECHOCARDIOGRAM LIMITED  Result Date: 12/01/2020    ECHOCARDIOGRAM LIMITED REPORT   Patient Name:   [name] [facility] Date of Exam: 12/01/2020 Medical Rec #:  [id number]      Height:       70.0 in Accession #:    [id number]     Weight:       189.4 lb Date of Birth:  1933-11-13     BSA:          2.039 m Patient Age:    20 years       BP:           132/96 mmHg Patient Gender: M              HR:           130 bpm. Exam Location:  Inpatient Procedure: Limited Echo, Limited Color Doppler and Cardiac Doppler Indications:    CHF-Acute Systolic [id number]  History:        Patient has prior history of Echocardiogram examinations, most                 recent 10/26/2020. CHF, CAD, Aortic Valve Disease,                 Arrythmias:PVCs/NSVT and Atrial Fibrillation; Risk                 Factors:Diabetes and Hypertension. Non-ST  elevation MI versus                 demand ischemia,                 Macrocytic anemia.  Sonographer:    Darlina Sicilian RDCS Referring Phys: [id number] Darreld Mclean  Sonographer Comments: Echo performed with patient supine and on artificial respirator. IMPRESSIONS  1. Bedside echocardiogram post PEA arrest, performed STAT.  2. Left ventricular ejection fraction, by estimation, is 20%. The left ventricle has severely decreased function. Best preserved function in the lateral wall. There is mild left ventricular hypertrophy.  3. Right ventricular systolic function is moderately reduced. The right ventricular size is moderately enlarged. There is mildly elevated pulmonary artery systolic pressure. The estimated right ventricular systolic pressure is [id number] mmHg.  4. Large pleural effusion.  5. The mitral valve is degenerative. Moderate to severe mitral valve regurgitation, likely secondary MR due to LV dysfunction.  6. The aortic valve is abnormal. There is severe calcifcation  of the aortic valve. Gradient not assessed on this focused study.  7. The inferior vena cava is dilated in size with <50% respiratory variability, suggesting right atrial pressure of 15 mmHg. FINDINGS  Left Ventricle: Left ventricular ejection fraction, by estimation, is 20%. The left ventricle has severely decreased function. The left ventricular internal cavity size was normal in size. There is mild left ventricular hypertrophy. Right Ventricle: The right ventricular size is moderately enlarged. Right ventricular systolic function is moderately reduced. There is mildly elevated pulmonary artery systolic pressure. The tricuspid regurgitant velocity is 2.40 m/s, and with an assumed right atrial pressure of 15 mmHg, the estimated right ventricular systolic pressure is 93.7 mmHg. Pericardium: Large pleural effusion. Mitral Valve: The mitral valve is degenerative in appearance. Moderate to severe mitral valve regurgitation. Aortic Valve: The aortic  valve is abnormal. There is severe calcifcation of the aortic valve. Aortic valve mean gradient measures 9.0 mmHg. Aortic valve peak gradient measures 16.2 mmHg. Venous: The inferior vena cava is dilated in size with less than 50% respiratory variability, suggesting right atrial pressure of 15 mmHg. LEFT VENTRICLE PLAX 2D LVIDd:         5.10 cm LVIDs:         4.50 cm LV PW:         1.20 cm LV IVS:        1.20 cm  LV Volumes (MOD) LV vol d, MOD A2C: 124.0 ml LV vol d, MOD A4C: 137.0 ml LV vol s, MOD A2C: 99.4 ml LV vol s, MOD A4C: 111.0 ml LV SV MOD A2C:     24.6 ml LV SV MOD A4C:     137.0 ml LV SV MOD BP:      30.9 ml LEFT ATRIUM         Index LA diam:    4.60 cm 2.26 cm/m  AORTIC VALVE AV Vmax:      201.00 cm/s AV Vmean:     131.000 cm/s AV VTI:       0.316 m AV Peak Grad: 16.2 mmHg AV Mean Grad: 9.0 mmHg TRICUSPID VALVE TR Peak grad:   23.0 mmHg TR Vmax:        240.00 cm/s Cherlynn Kaiser MD Electronically signed by Cherlynn Kaiser MD Signature Date/Time: 12/01/2020/10:26:47 AM    Final     Microbiology Recent Results (from the past 240 hour(s))  Resp Panel by RT-PCR (Flu A&B, Covid) Nasopharyngeal Swab     Status: None   Collection Time: 11/17/2020  6:11 PM   Specimen: Nasopharyngeal Swab; Nasopharyngeal(NP) swabs in vial transport medium  Result Value Ref Range Status   SARS Coronavirus 2 by RT PCR NEGATIVE NEGATIVE Final    Comment: (NOTE) SARS-CoV-2 target nucleic acids are NOT DETECTED.  The SARS-CoV-2 RNA is generally detectable in upper respiratory specimens during the acute phase of infection. The lowest concentration of SARS-CoV-2 viral copies this assay can detect is 138 copies/mL. A negative result does not preclude SARS-Cov-2 infection and should not be used as the sole basis for treatment or other patient management decisions. A negative result may occur with  improper specimen collection/handling, submission of specimen other than nasopharyngeal swab, presence of viral mutation(s)  within the areas targeted by this assay, and inadequate number of viral copies(<138 copies/mL). A negative result must be combined with clinical observations, patient history, and epidemiological information. The expected result is Negative.  Fact Sheet for Patients:  EntrepreneurPulse.com.au  Fact Sheet for Healthcare Providers:  IncredibleEmployment.be  This  test is no t yet approved or cleared by the [place] and  has been authorized for detection and/or diagnosis of SARS-CoV-2 by FDA under an Emergency Use Authorization (EUA). This EUA will remain  in effect (meaning this test can be used) for the duration of the COVID-19 declaration under Section 564(b)(1) of the Act, 21 U.S.C.section 360bbb-3(b)(1), unless the authorization is terminated  or revoked sooner.       Influenza A by PCR NEGATIVE NEGATIVE Final   Influenza B by PCR NEGATIVE NEGATIVE Final    Comment: (NOTE) The Xpert Xpress SARS-CoV-2/FLU/RSV plus assay is intended as an aid in the diagnosis of influenza from Nasopharyngeal swab specimens and should not be used as a sole basis for treatment. Nasal washings and aspirates are unacceptable for Xpert Xpress SARS-CoV-2/FLU/RSV testing.  Fact Sheet for Patients: EntrepreneurPulse.com.au  Fact Sheet for Healthcare Providers: IncredibleEmployment.be  This test is not yet approved or cleared by the [place] FDA and has been authorized for detection and/or diagnosis of SARS-CoV-2 by FDA under an Emergency Use Authorization (EUA). This EUA will remain in effect (meaning this test can be used) for the duration of the COVID-19 declaration under Section 564(b)(1) of the Act, 21 U.S.C. section 360bbb-3(b)(1), unless the authorization is terminated or revoked.  Performed at [facility], [place] [address] [place], [place] [address]   MRSA PCR Screening     Status: Abnormal    Collection Time: 12/01/20  9:25 AM   Specimen: Nasal Mucosa; Nasopharyngeal  Result Value Ref Range Status   MRSA by PCR POSITIVE (A) NEGATIVE Final    Comment:        The GeneXpert MRSA Assay (FDA approved for NASAL specimens only), is one component of a comprehensive MRSA colonization surveillance program. It is not intended to diagnose MRSA infection nor to guide or monitor treatment for MRSA infections. RESULT CALLED TO, READ BACK BY AND VERIFIED WITH: Marguerita Beards RN 11:30 12/01/20 ([email or web address]) Performed at [facility], [place] [address] [place], [place] [address]     Lab Basic Metabolic Panel: Recent Labs  Lab 12/02/20 1309 12/03/20 0004 12/03/20 0500 12/03/20 1635 12/04/20 0148 12/04/20 0355 12/04/20 1137 12/04/20 1732 12/04/20 2207 2020/12/26 0225 12-26-2020 0407  NA  --  140  --   --    < > 141 139 137 138 138  --   K  --  3.7  --   --    < > 2.7* 3.4* 5.2* 5.5* 5.3*  --   CL  --  103  --   --    < > 106 103 101 103 103  --   CO2  --  23  --   --    < > 24 23 20* 19* 16*  --   GLUCOSE  --  295*  --   --    < > 228* 147* 370* 280* 212*  --   BUN  --  40*  --   --    < > 38* 40* 44* 47* 50*  --   CREATININE  --  1.57*  --   --    < > 1.57* 1.62* 1.93* 2.31* 2.62*  --   CALCIUM  --  8.4*  --   --    < > 8.3* 8.2* 8.3* 8.1* 7.9*  --   MG 2.2 2.1 2.0 1.8  --   --   --  2.2  --   --  2.4  PHOS 4.6  --  4.2 3.5  --   --   --   --   --   --   --    < > = values in this interval not displayed.   Liver Function Tests: Recent Labs  Lab 11/17/2020 1620 12/01/20 0249  AST 25 29  ALT 22 23  ALKPHOS 34* 40  BILITOT 1.0 1.0  PROT 6.4* 6.5  ALBUMIN 3.7 3.7   No results for input(s): LIPASE, AMYLASE in the last 168 hours. No results for input(s): AMMONIA in the last 168 hours. CBC: Recent Labs  Lab 11/17/2020 2326 12/06/2020 0215 12/01/20 0249 12/01/20 0958 12/01/20 2151 12/02/20 0351 12/03/20 0500 12/04/20 0148 2020-12-25 0407  WBC 7.7   < > 12.7*  --   --  9.0 7.5  15.2* 17.2*  NEUTROABS 4.2  --   --   --   --   --   --   --   --   HGB 10.6*   < > 11.6*   < > 11.2* 10.8* 10.0* 10.5* 10.9*  HCT 35.2*   < > 36.9*   < > 33.0* 32.6* 32.3* 34.7* 37.4*  MCV 100.0   < > 98.9  --   --  94.2 97.3 98.9 101.9*  PLT 193   < > 239  --   --  173 169 225 191   < > = values in this interval not displayed.   Cardiac Enzymes: No results for input(s): CKTOTAL, CKMB, CKMBINDEX, TROPONINI in the last 168 hours. Sepsis Labs: Recent Labs  Lab 12/02/20 0351 12/03/20 0500 12/03/20 1035 12/04/20 0148 December 25, 2020 0407  PROCALCITON  --   --  1.99 2.48 4.12  WBC 9.0 7.5  --  15.2* 17.2*    Procedures/Operations  TEE/cardioversion x3   Robi Mitter 25-Dec-2020, 1:59 PM

## 2020-12-11 NOTE — Progress Notes (Signed)
ANTICOAGULATION CONSULT NOTE - Follow Up Consult  Pharmacy Consult for heparin Indication: atrial fibrillation  Labs: Recent Labs    12/03/20 0500 12/03/20 1446 12/03/20 2350 12/04/20 0148 12/04/20 0355 12/04/20 1137 12/04/20 1732 12/04/20 2207 12/21/2020 0407  HGB 10.0*  --   --  10.5*  --   --   --   --  10.9*  HCT 32.3*  --   --  34.7*  --   --   --   --  37.4*  PLT 169  --   --  225  --   --   --   --  191  APTT 119* 103*  --  96*  --   --   --   --   --   HEPARINUNFRC 0.80* 0.77* 0.59  --  0.45  --   --   --  0.28*  CREATININE  --   --   --  1.56* 1.57* 1.62* 1.93* 2.31*  --     Assessment: 87yo male with no anticoagulants reported PTA. Now being treated for new onset afib with IV heparin. Pt did receive apixaban 5mg  x1 on 12/21 @2139  and has since been transitioned back to IV heparin for anticoagulation.  As heparin levels and aPTTs now clinically correlated, will follow with heparin level only.   Heparin level down to slightly subtherapeutic (0.28) on heparin 1100 units/hr. No issues with line or bleeding reported per RN.  Goal of Therapy:  Heparin level 0.3-0.7 units/ml   Plan:  Increase heparin to 1200 units/hr  Will f/u 8hr heparin level  Sherlon Handing, PharmD, BCPS Please see amion for complete clinical pharmacist phone list 2020-12-21 5:25 AM

## 2020-12-11 DEATH — deceased

## 2021-01-21 ENCOUNTER — Ambulatory Visit: Payer: Medicare PPO | Admitting: Neurology

## 2021-02-23 ENCOUNTER — Ambulatory Visit: Payer: Medicare PPO | Admitting: Internal Medicine

## 2021-03-23 ENCOUNTER — Ambulatory Visit: Payer: Medicare PPO | Admitting: Family Medicine

## 2021-05-31 ENCOUNTER — Ambulatory Visit: Payer: Medicare PPO | Admitting: Hematology

## 2021-05-31 ENCOUNTER — Other Ambulatory Visit: Payer: Medicare PPO

## 2021-09-28 ENCOUNTER — Ambulatory Visit: Payer: Medicare PPO
